# Patient Record
Sex: Male | Born: 1965 | Hispanic: Yes | Marital: Married | Smoking: Former smoker
Health system: Southern US, Community
[De-identification: ages and names within clinical notes are randomized; demographics above are authoritative.]

## PROBLEM LIST (undated history)

## (undated) DIAGNOSIS — F1010 Alcohol abuse, uncomplicated: Secondary | ICD-10-CM

## (undated) DIAGNOSIS — E785 Hyperlipidemia, unspecified: Secondary | ICD-10-CM

## (undated) DIAGNOSIS — R748 Abnormal levels of other serum enzymes: Secondary | ICD-10-CM

## (undated) DIAGNOSIS — K2970 Gastritis, unspecified, without bleeding: Secondary | ICD-10-CM

## (undated) DIAGNOSIS — R079 Chest pain, unspecified: Secondary | ICD-10-CM

## (undated) DIAGNOSIS — I10 Essential (primary) hypertension: Secondary | ICD-10-CM

## (undated) HISTORY — DX: Abnormal levels of other serum enzymes: R74.8

## (undated) HISTORY — DX: Gastritis, unspecified, without bleeding: K29.70

## (undated) HISTORY — DX: Essential (primary) hypertension: I10

## (undated) HISTORY — DX: Hyperlipidemia, unspecified: E78.5

## (undated) HISTORY — DX: Alcohol abuse, uncomplicated: F10.10

## (undated) HISTORY — DX: Chest pain, unspecified: R07.9

---

## 2015-05-18 ENCOUNTER — Emergency Department (HOSPITAL_COMMUNITY)
Admission: EM | Admit: 2015-05-18 | Discharge: 2015-05-18 | Disposition: A | Discharge status: Home / Self Care | Payer: Self-pay | Attending: Emergency Medicine | Admitting: Emergency Medicine

## 2015-05-18 ENCOUNTER — Emergency Department (HOSPITAL_COMMUNITY): Payer: Self-pay

## 2015-05-18 ENCOUNTER — Encounter (HOSPITAL_COMMUNITY): Payer: Self-pay | Admitting: Emergency Medicine

## 2015-05-18 DIAGNOSIS — Y9389 Activity, other specified: Secondary | ICD-10-CM | POA: Insufficient documentation

## 2015-05-18 DIAGNOSIS — Y9289 Other specified places as the place of occurrence of the external cause: Secondary | ICD-10-CM | POA: Insufficient documentation

## 2015-05-18 DIAGNOSIS — Y999 Unspecified external cause status: Secondary | ICD-10-CM | POA: Insufficient documentation

## 2015-05-18 DIAGNOSIS — E119 Type 2 diabetes mellitus without complications: Secondary | ICD-10-CM | POA: Insufficient documentation

## 2015-05-18 DIAGNOSIS — S99912A Unspecified injury of left ankle, initial encounter: Secondary | ICD-10-CM | POA: Insufficient documentation

## 2015-05-18 DIAGNOSIS — W19XXXA Unspecified fall, initial encounter: Secondary | ICD-10-CM | POA: Insufficient documentation

## 2015-05-18 DIAGNOSIS — N481 Balanitis: Secondary | ICD-10-CM | POA: Insufficient documentation

## 2015-05-18 DIAGNOSIS — X509XXA Other and unspecified overexertion or strenuous movements or postures, initial encounter: Secondary | ICD-10-CM | POA: Insufficient documentation

## 2015-05-18 LAB — CBG MONITORING, ED: Glucose-Capillary: 242 mg/dL — ABNORMAL HIGH (ref 65–99)

## 2015-05-18 MED ORDER — HYDROCORTISONE 1 % EX CREA: TOPICAL_CREAM | CUTANEOUS | Status: DC

## 2015-05-18 MED ORDER — METFORMIN HCL 500 MG PO TABS: 500.0000 mg | ORAL_TABLET | Freq: Two times a day (BID) | ORAL | Status: DC

## 2015-05-18 MED ORDER — FLUCONAZOLE 100 MG PO TABS
150.0000 mg | ORAL_TABLET | Freq: Every day | ORAL | Status: DC
  Administered 2015-05-18: 150 mg via ORAL
  Filled 2015-05-18: qty 2

## 2015-05-18 NOTE — Discharge Instructions (Signed)
Balanitis (Balanitis) La balanitis es la inflamacin de la cabeza del pene (glande).  CAUSAS  Puede tener mltiples causas, tanto infecciosas como no infecciosas. Con frecuencia, la balanitis es el resultado de una higiene personal deficiente, especialmente en los hombres no circuncidados. Sin una higiene [place]cuada, los virus, las bacterias y los hongos se acumulan entre el prepucio y el glande. Esto puede ocasionar una infeccin. La falta de aire y la irritacin debido a la secrecin normal llamada esmegma contribuyen al problema en los hombres no circuncidados. Otras causas son:  Irritacin qumica por el uso de algunos jabones y geles de ducha (especialmente jabones con perfume), condones, lubricantes personales, vaselina, espermicidas y acondicionadores de telas.  Enfermedades de la piel, como el eczema, la dermatitis y la psoriasis.  Alergias a algunos frmacos, como tetraciclina y sulfas.  Algunas enfermedades como la cirrosis heptica, insuficiencia cardaca congestiva y enfermedades renales.  Obesidad mrbida. FACTORES DE RIESGO  Diabetes mellitus.  Un prepucio apretado que es difcil de tirar hacia atrs hasta pasar el glande (fimosis).  Tener relaciones sexuales sin usar un condn. Blake DivineSIGNOS Y SNTOMAS  Los sntomas pueden ser:  Secrecin que proviene de la zona debajo del prepucio.  Sensibilidad.  Picazn e imposibilidad para Landscape architectmantener una ereccin (debido al dolor).  Irritacin y erupcin cutnea.  Llagas en el glande y el prepucio. DIAGNSTICO El diagnstico de balanitis se realiza a travs de un examen fsico. TRATAMIENTO El tratamiento se basa en la causa. El tratamiento puede incluir:  Lavados frecuentes.  Mantener el glande y el prepucio secos.  El uso de medicamentos, como cremas, Clinical research associateanalgsicos, antibiticos o medicamentos para tratar infecciones por hongos.  Baos de asiento. Si la irritacin est originada en una cicatriz del prepucio que impide una  retraccin fcil, se recomienda una circuncisin.  INSTRUCCIONES PARA EL CUIDADO EN EL HOGAR  Debe evitar las relaciones sexuales hasta que la afeccin se haya mejorado. ASEGRESE DE QUE:  Comprende estas instrucciones.  Controlar su afeccin.  Recibir ayuda de inmediato si no mejora o si empeora.   Esta informacin no tiene Theme park managercomo fin reemplazar el consejo del mdico. Asegrese de hacerle al mdico cualquier pregunta que tenga.   Document Released: 01/07/2013 Document Revised: 05/12/2013 Elsevier Interactive Patient Education 2016 [employer]   Diabetes mellitus tipo2, adultos (Type 2 Diabetes Mellitus, Adult) La diabetes mellitus tipo2, generalmente denominada diabetes tipo2, es una enfermedad prolongada (crnica). En la diabetes tipo2, el pncreas no produce suficiente insulina (una hormona), las clulas son menos sensibles a la insulina que se produce (resistencia a la insulina), o ambos. Normalmente, la Johnson Controlsinsulina mueve los azcares de los alimentos a las clulas de los tejidos. Las clulas de los tejidos Cendant Corporationutilizan los azcares para Psychiatristobtener energa. La falta de insulina o la falta de una respuesta normal a la insulina hace que el exceso de azcar se acumule en la sangre en lugar de Customer service managerpenetrar en las clulas de los tejidos. Como resultado, se producen niveles altos de Bankerazcar en la sangre (hiperglucemia). El [address]fect[address] de los niveles altos de azcar (glucosa) puede causar muchas complicaciones.  La diabetes tipo2 antes tambin se denominaba diabetes del [place]adulto, pero puede ocurrir a Actuarycualquier edad.  FACTORES DE RIESGO  Una persona tiene mayor predisposicin a desarrollar diabetes tipo 2 si alguien en su familia tiene la enfermedad y tambin tiene uno o ms de los siguientes factores de riesgo principales:  Aumento de [place], sobrepeso u obesidad.  Estilo de vida sedentario.  Una historia de consumo constante de alimentos de altas  caloras. Mantener un peso saludable y realizar actividad  fsica regular puede reducir la probabilidad de desarrollar diabetes tipo 2. SNTOMAS  Una persona con diabetes tipo 2 no presenta sntomas en un principio. Los sntomas de la diabetes tipo 2 aparecen lentamente. Los sntomas son:  Aumento de la sed (polidipsia).  Aumento de la miccin (poliuria).  Orina con ms frecuencia durante la noche (nocturia).  Cambios repentinos en el peso o sin motivo aparente.  Infecciones frecuentes y recurrentes.  Cansancio (fatiga).  Debilidad.  Cambios en la visin, como visin borrosa.  Olor a Water quality scientist.  Dolor abdominal.  Nuseas o vmitos.  Cortes o moretones que tardan en sanarse.  Hormigueo o adormecimiento de las manos y los pies.  Una herida abierta en la piel (lcera). DIAGNSTICO Con frecuencia la diabetes tipo 2 no se diagnostica hasta que se presentan las complicaciones de la diabetes. La diabetes tipo 2 se diagnostica cuando los sntomas o las complicaciones se presentan y cuando aumentan los niveles de glucosa en la [place]. El nivel de glucosa en la sangre puede controlarse en uno o ms de los siguientes anlisis de sangre:  Medicin de glucosa en la sangre en [place]. No se le permitir comer durante al menos 8 horas antes de que se tome [place] de [place].  Pruebas al azar de glucosa en la sangre. El nivel de glucosa en la sangre se controla en cualquier momento del da sin importar el momento en que haya comido.  Prueba de A1c (hemoglobina glucosilada) Una prueba de A1c proporciona informacin sobre el control de la glucosa en la sangre durante los ltimos 3 meses.  Prueba de tolerancia a la glucosa oral (PTGO). La glucosa en la sangre se mide despus de no haber comido (ayunas) durante dos horas y despus de beber una bebida que contenga glucosa. TRATAMIENTO   Usted puede necesitar administrarse insulina o medicamentos para la diabetes todos los das para [employer] niveles de glucosa en la sangre en el rango  deseado.  Si [place] insulina, tal vez necesite ajustar la dosis segn los carbohidratos que haya consumido en cada comida o colacin.  [employer] del tratamiento, se recomienda hacer cambios en el estilo de vida. Estos pueden incluir lo siguiente:  Customer service manager de alimentacin personalizado elaborado por un nutricionista.  Hacer ejercicio fsico a diario. El mdico establecer los objetivos personalizados del tratamiento para usted en funcin de su edad, los medicamentos que toma, el tiempo que hace que tiene diabetes y cualquier otra enfermedad que padezca. Generalmente, el objetivo del tratamiento es [employer] siguientes niveles sanguneos de glucosa:  Antes de las comidas (preprandial): de 80 a 130 mg/dl.  Antes de las comidas (preprandial): de 80 a 130 mg/dl.  A1c: menos del 6,5 % al 7 %. INSTRUCCIONES PARA EL CUIDADO EN EL HOGAR   Controle su nivel de hemoglobina A1c dos veces al ao.  Contrlese a diario Air cabin crew de glucosa en la sangre segn las indicaciones de su mdico.  Supervise las cetonas en la orina cuando est enferma y segn las indicaciones de su mdico.  Tome el medicamento para la diabetes o adminstrese insulina segn las indicaciones de su mdico para Radio producer nivel de glucosa en la sangre en el rango deseado.  Nunca se quede sin medicamento para la diabetes o sin insulina. Es necesario que la reciba [employer].  Si [place] insulina, tal vez deba ajustar la cantidad de insulina administrada segn los carbohidratos consumidos. Los hidratos  de carbono [employer] niveles de glucosa en la sangre, [place] deben incluirse en su dieta. Los hidratos de carbono aportan vitaminas, minerales y [place] que son [place] parte esencial de una dieta saludable. Los hidratos de carbono se encuentran en frutas, verduras, cereales integrales, productos lcteos, legumbres y alimentos que contienen azcares aadidos.  Consuma alimentos saludables. Programe una cita con un  nutricionista certificado para que lo ayude a Chief Executive Officer de alimentacin adecuado para usted.  Baje de peso si es necesario.  Lleve una tarjeta de alerta mdica o use una pulsera o medalla de alerta mdica.  Lleve con usted una colacin de 15gramos de hidratos de carbono en todo momento para controlar los niveles bajos de glucosa en la sangre (hipoglucemia). Algunos ejemplos de colaciones de 15gramos de hidratos de carbono son los siguientes:  Tabletas de glucosa, 3 o 4.  Gel de glucosa, tubo de 15 gramos.  Pasas de uva, 2 cucharadas (24 gramos).  Caramelos de goma, 6.  Galletas de [place], 8.  Gaseosa comn, 4onzas ( ).  Pastillas de goma, 9.  Reconocer la hipoglucemia. La hipoglucemia se produce cuando los niveles de glucosa en la sangre son de 70 mg/dl o menos. El riesgo de hipoglucemia aumenta durante el ayuno o cuando se saltea las comidas, durante o despus de Education officer, environmental ejercicio intenso y [place] duerme. Los sntomas de hipoglucemia son:  Temblores o sacudidas.  Disminucin de la capacidad de concentracin.  Sudoracin.  Aumento de la frecuencia cardaca.  Dolor de [place].  Sequedad en la boca.  Hambre.  Irritabilidad.  Ansiedad.  Sueo agitado.  Alteracin del habla o de la coordinacin.  Confusin.  Tratar la hipoglucemia rpidamente. Si usted est alerta y puede tragar con seguridad, siga la regla de 15/15 que consiste en:  [employer] 15 y 20gramos de glucosa de accin rpida o carbohidratos. Las opciones de accin rpida son un gel de glucosa, tabletas de glucosa, o 4 onzas (120 ml) de jugo de frutas, gaseosa comn, o leche baja en grasa.  Compruebe su nivel de glucosa en la sangre 15 minutos despus de tomar la glucosa.  Tome entre 15 y 20 gramos ms de glucosa si el nivel de glucosa en la sangre todava es de /dl o inferior.  Ingiera una comida o una colacin en el lapso de 1 hora una vez que los niveles de glucosa en  la sangre vuelven a la normalidad.  Est atento a si siente mucha sed u orina con mayor frecuencia, porque son signos tempranos de hiperglucemia. El reconocimiento temprano de la hiperglucemia permite un tratamiento oportuno. Trate la hiperglucemia segn le indic su mdico.  Haga, al menos, de actividad fsica moderada a la semana, distribuidos en, por lo menos, 3das a la semana o como lo indique su mdico. Adems, debe realizar ejercicios de resistencia por lo menos 2veces a la semana o como lo indique su mdico. Trate de no permanecer inactivo durante ms de seguidos.  Ajuste su medicamento y la ingesta de alimentos, segn sea necesario, si inicia un nuevo ejercicio o deporte.  Siga su plan para los [address] de enfermedad cuando no puede comer o beber como de [place].  No consuma ningn producto que contenga tabaco, como cigarrillos, tabaco de Theatre manager o Administrator, Civil Service. Si necesita ayuda para dejar de fumar, hable con el mdico.  Limite el consumo de alcohol a no ms de 1 medida por da en las mujeres no embarazadas y 2 medidas en los hombres. Debe beber alcohol  solo [place] come. Hable con su mdico acerca de si el alcohol es seguro para usted. Informe a su mdico si bebe alcohol varias veces a la semana.  Concurra a todas las visitas de control como se lo haya indicado el mdico. Esto es importante.  Programe un examen de la vista poco despus del diagnstico de diabetes tipo 2 y luego anualmente.  Realice diariamente el cuidado de la piel y de los pies. Examine su piel y los pies diariamente para ver si tiene cortes, moretones, enrojecimiento, problemas en las uas, sangrado, ampollas o Advertising account planner. Su mdico debe hacerle un examen de los pies una vez por ao.  Cepllese los dientes y encas por lo menos dos veces al da y use hilo dental al menos una vez por da. Concurra regularmente a las visitas de control con el dentista.  Comparta su plan de control de  diabetes en el trabajo o en la escuela.  Mantngase al da con las vacunas. Se recomienda que se vacune contra la gripe todos los [place]. Adems, que se vacune contra la neumona (vacuna antineumoccica). Si es mayor de 60 aos y nunca se Animator la neumona, esta vacuna puede administrarse como una serie de dos vacunas por separado. Pregntele al mdico qu otras vacunas se pueden recomendar.  Aprenda a Dealer.  Obtenga la mayor cantidad posible de informacin sobre la diabetes y solicite ayuda siempre que sea necesario.  Busque programas de rehabilitacin y participe en ellos para mantener o mejorar su independencia y su calidad de vida. Solicite la derivacin a fisioterapia o terapia ocupacional si se le [facility] o la mano, o tiene problemas para asearse, vestirse, comer, o durante la [place] fsica. SOLICITE ATENCIN MDICA SI:   No puede comer alimentos o beber por ms de 6 horas.  Tuvo nuseas o ha vomitado durante ms de 6 horas.  Su nivel de glucosa en la sangre es mayor de 240 mg/dl.  Presenta algn cambio en el estado mental.  Desarrolla una enfermedad grave adicional.  Tuvo diarrea durante ms de 6 horas.  Ha estado enfermo o ha tenido fiebre durante un par [address] y no mejora.  Siente dolor al practicar cualquier actividad fsica. SOLICITE ATENCIN MDICA DE INMEDIATO SI:  Tiene dificultad para respirar.  Tiene niveles de cetonas moderados a altos.   Esta informacin no tiene Theme park manager el consejo del mdico. Asegrese de hacerle al mdico cualquier pregunta que tenga.   Document Released: 05/07/2005 Document Revised: 01/26/2015 Elsevier Interactive Patient Education [employer]

## 2015-05-18 NOTE — ED Notes (Signed)
Patient transported to X-ray 

## 2015-05-18 NOTE — ED Notes (Signed)
Requesting Pain Medication.

## 2015-05-18 NOTE — ED Notes (Signed)
RN bedside w/ provider, using interpretator line

## 2015-05-18 NOTE — ED Notes (Signed)
Pt. reports distal penile skin  itching with redness for 2 weeks unrelieved by prescription cream , pt. also reports left lower leg / left foot pain onset last Sunday after a fall , ambulatory .

## 2015-05-20 NOTE — ED Provider Notes (Signed)
CSN: [id number]     Arrival date & time 05/18/15  0031 History   First MD Initiated Contact with Patient 05/18/15 0150     Chief Complaint  Patient presents with  . Fall    Penile itching     (Consider location/radiation/quality/duration/timing/severity/associated sxs/prior Treatment) HPI Comments: 49 y.o. Male presents for rash on his penis x6 months and for left ankle pain.  The patient reports that about 3 days ago he fell twisting his left ankle.  He has been able to walk on it with pain since that time but reports that the pain is from his ankle to below his left knee.  Denies change in sensation or strength.  Patient also reports that he has had redness with whitish discharge overlying it on the head of his penis for 6 months.  He reports using Nystatin cream without improvement or resolution.  Denies penile discharge.  No pain with urination.  Reports he may have been told in the past that he has DM but has never been treated for such.  Patient is a 49 y.o. male presenting with fall.  Fall Pertinent negatives include no abdominal pain, no headaches and no shortness of breath.    History reviewed. No pertinent past medical history. History reviewed. No pertinent past surgical history. No family history on file. Social History  Substance Use Topics  . Smoking status: Never Smoker   . Smokeless tobacco: None  . Alcohol Use: Yes    Review of Systems  Constitutional: Negative for fever, chills, appetite change and fatigue.  HENT: Negative for congestion and postnasal drip.   Eyes: Negative for pain.  Respiratory: Negative for cough, chest tightness and shortness of breath.   Gastrointestinal: Negative for nausea, vomiting, abdominal pain and diarrhea.  Genitourinary: Positive for penile pain. Negative for dysuria, urgency, flank pain, discharge and penile swelling.  Musculoskeletal: Positive for arthralgias (left ankle). Negative for myalgias and back pain.  Skin: Positive for  rash (on head of penis).  Neurological: Negative for dizziness, weakness and headaches.  Hematological: Does not bruise/bleed easily.      Allergies  Review of patient's allergies indicates no known allergies.  Home Medications   Prior to Admission medications   Medication Sig Start Date End Date Taking? Authorizing Provider  hydrocortisone cream 1 % Apply to affected area 2 times daily 05/18/15   Leta BaptistEmily Roe Nguyen, MD  metFORMIN (GLUCOPHAGE) 500 MG tablet Take 1 tablet (500 mg total) by mouth 2 (two) times daily with a meal. 05/18/15   Leta BaptistEmily Roe Nguyen, MD   BP 153/100 mmHg  Pulse 104  Temp(Src) 97.6 F (36.4 C) (Oral)  Resp 16  Ht 5\' 3"  (1.6 m)  Wt 166 lb (75.297 kg)  BMI 29.41 kg/m2  SpO2 95% Physical Exam  Constitutional: He is oriented to person, place, and time. He appears well-developed and well-nourished. No distress.  HENT:  Head: Normocephalic and atraumatic.  Right Ear: External ear normal.  Left Ear: External ear normal.  Mouth/Throat: Oropharynx is clear and moist. No oropharyngeal exudate.  Eyes: EOM are normal. Pupils are equal, round, and reactive to light.  Neck: Normal range of motion. Neck supple.  Cardiovascular: Normal rate, regular rhythm, normal heart sounds and intact distal pulses.   No murmur heard. Pulses:      Dorsalis pedis pulses are 2+ on the right side, and 2+ on the left side.  Pulmonary/Chest: Effort normal. No respiratory distress. He has no wheezes. He has no rales.  Abdominal:  Soft. He exhibits no distension. There is no tenderness.  Musculoskeletal: He exhibits no edema.       Left knee: Normal.       Left ankle: Normal. He exhibits normal range of motion. No tenderness.       Left lower leg: Normal.  Neurological: He is alert and oriented to person, place, and time. No sensory deficit. He exhibits normal muscle tone.  Skin: Skin is warm and dry. No rash noted. He is not diaphoretic.  Vitals reviewed.   ED Course  Procedures  (including critical care time) Labs Review Labs Reviewed  CBG MONITORING, ED - Abnormal; Notable for the following:    Glucose-Capillary 242 (*)    All other components within normal limits    Imaging Review No results found. I have personally reviewed and evaluated these images and lab results as part of my medical decision-making.   EKG Interpretation None      MDM  Patient was seen and evaluated in stable condition.  Exam consistent with Balanitis.  Left lower extremity examination benign and xrays negative for acute process. Glucose elevated and consistent with DM.  Patient given dose of Fluconazole and discharged with Metformin for glucose control and hydrocortisone cream.  Patient discharged home in stable condition with instruction to follow up outpatient. Final diagnoses:  Balanitis  Diabetes mellitus, new onset (HCC)    1. Balanitis  2. Diabetes mellitus     Leta Baptist, MD 05/20/15 [phone]

## 2016-09-26 ENCOUNTER — Observation Stay (HOSPITAL_BASED_OUTPATIENT_CLINIC_OR_DEPARTMENT_OTHER): Payer: Self-pay

## 2016-09-26 ENCOUNTER — Encounter (HOSPITAL_COMMUNITY): Payer: Self-pay | Admitting: Emergency Medicine

## 2016-09-26 ENCOUNTER — Observation Stay (HOSPITAL_COMMUNITY)
Admission: EM | Admit: 2016-09-26 | Discharge: 2016-09-26 | Disposition: A | Discharge status: Home / Self Care | Payer: Self-pay | Attending: Internal Medicine | Admitting: Internal Medicine

## 2016-09-26 ENCOUNTER — Emergency Department (HOSPITAL_COMMUNITY): Payer: Self-pay

## 2016-09-26 ENCOUNTER — Other Ambulatory Visit: Payer: Self-pay

## 2016-09-26 DIAGNOSIS — E1165 Type 2 diabetes mellitus with hyperglycemia: Secondary | ICD-10-CM

## 2016-09-26 DIAGNOSIS — E119 Type 2 diabetes mellitus without complications: Secondary | ICD-10-CM | POA: Insufficient documentation

## 2016-09-26 DIAGNOSIS — R079 Chest pain, unspecified: Secondary | ICD-10-CM | POA: Diagnosis present

## 2016-09-26 DIAGNOSIS — Z87891 Personal history of nicotine dependence: Secondary | ICD-10-CM | POA: Insufficient documentation

## 2016-09-26 DIAGNOSIS — Z7984 Long term (current) use of oral hypoglycemic drugs: Secondary | ICD-10-CM | POA: Insufficient documentation

## 2016-09-26 DIAGNOSIS — Z79899 Other long term (current) drug therapy: Secondary | ICD-10-CM | POA: Insufficient documentation

## 2016-09-26 DIAGNOSIS — R0789 Other chest pain: Principal | ICD-10-CM | POA: Insufficient documentation

## 2016-09-26 DIAGNOSIS — M542 Cervicalgia: Secondary | ICD-10-CM | POA: Insufficient documentation

## 2016-09-26 LAB — CBC
HCT: 42.5 % (ref 39.0–52.0)
HEMATOCRIT: 42.3 % (ref 39.0–52.0)
HEMOGLOBIN: 15 g/dL (ref 13.0–17.0)
HEMOGLOBIN: 14.9 g/dL (ref 13.0–17.0)
MCH: 31.5 pg (ref 26.0–34.0)
MCH: 31.2 pg (ref 26.0–34.0)
MCHC: 35.5 g/dL (ref 30.0–36.0)
MCHC: 35.1 g/dL (ref 30.0–36.0)
MCV: 88.9 fL (ref 78.0–100.0)
MCV: 89.1 fL (ref 78.0–100.0)
PLATELETS: 280 10*3/uL (ref 150–400)
Platelets: 287 10*3/uL (ref 150–400)
RBC: 4.76 MIL/uL (ref 4.22–5.81)
RBC: 4.77 MIL/uL (ref 4.22–5.81)
RDW: 12.6 % (ref 11.5–15.5)
RDW: 12.7 % (ref 11.5–15.5)
WBC: 9 10*3/uL (ref 4.0–10.5)
WBC: 7.8 10*3/uL (ref 4.0–10.5)

## 2016-09-26 LAB — EXERCISE TOLERANCE TEST
CHL CUP MPHR: 169 {beats}/min
CHL CUP RESTING HR STRESS: 76 {beats}/min
Estimated workload: 10.1 METS
Exercise duration (min): 8 min
Exercise duration (sec): 32 s
Peak HR: 144 {beats}/min
Percent HR: 85 %

## 2016-09-26 LAB — BASIC METABOLIC PANEL
ANION GAP: 10 (ref 5–15)
BUN: 12 mg/dL (ref 6–20)
CO2: 22 mmol/L (ref 22–32)
Calcium: 8.7 mg/dL — ABNORMAL LOW (ref 8.9–10.3)
Chloride: 104 mmol/L (ref 101–111)
Creatinine, Ser: 0.67 mg/dL (ref 0.61–1.24)
GFR calc Af Amer: >60 mL/min (ref >60)
GLUCOSE: 139 mg/dL — AB (ref 65–99)
POTASSIUM: 3.8 mmol/L (ref 3.5–5.1)
Sodium: 136 mmol/L (ref 135–145)

## 2016-09-26 LAB — GLUCOSE, CAPILLARY
GLUCOSE-CAPILLARY: 112 mg/dL — AB (ref 65–99)
Glucose-Capillary: 120 mg/dL — ABNORMAL HIGH (ref 65–99)

## 2016-09-26 LAB — CREATININE, SERUM: CREATININE: 0.64 mg/dL (ref 0.61–1.24)

## 2016-09-26 LAB — I-STAT TROPONIN, ED: Troponin i, poc: 0 ng/mL (ref 0.00–0.08)

## 2016-09-26 LAB — TROPONIN I

## 2016-09-26 LAB — HIV ANTIBODY (ROUTINE TESTING W REFLEX): HIV Screen 4th Generation wRfx: NONREACTIVE

## 2016-09-26 MED ORDER — NITROGLYCERIN 0.4 MG SL SUBL: 0.4000 mg | SUBLINGUAL_TABLET | SUBLINGUAL | Status: DC | PRN

## 2016-09-26 MED ORDER — ASPIRIN EC 325 MG PO TBEC: 325.0000 mg | DELAYED_RELEASE_TABLET | Freq: Every day | ORAL | Status: DC

## 2016-09-26 MED ORDER — INSULIN ASPART 100 UNIT/ML ~~LOC~~ SOLN
0.0000 [IU] | Freq: Three times a day (TID) | SUBCUTANEOUS | Status: DC
Start: 2016-09-26 — End: 2016-09-26

## 2016-09-26 MED ORDER — ONDANSETRON HCL 4 MG/2ML IJ SOLN
4.0000 mg | Freq: Four times a day (QID) | INTRAMUSCULAR | Status: DC | PRN
Start: 2016-09-26 — End: 2016-09-26

## 2016-09-26 MED ORDER — BLOOD GLUCOSE MONITOR KIT: PACK | 0 refills | Status: DC

## 2016-09-26 MED ORDER — ENOXAPARIN SODIUM 40 MG/0.4ML ~~LOC~~ SOLN: 40.0000 mg | SUBCUTANEOUS | Status: DC

## 2016-09-26 MED ORDER — ACETAMINOPHEN 325 MG PO TABS: 650.0000 mg | ORAL_TABLET | ORAL | Status: DC | PRN

## 2016-09-26 NOTE — Care Management Note (Signed)
Case Management Note  Patient Details  Name: Harlin HeysConstantino Perez Tolentino MRN: [id number] Date of Birth: 09/18/1965  Subjective/Objective:  Pt presented for chest pain. Pt is without PCP at this time. Plan for home.                    Action/Plan: CM [facility]. [facility]el [facility] that range in cost from $4.00-$10.00. No further needs from CM at this time.   Expected Discharge Date:  09/26/16               Expected Discharge Plan:  Home/Self Care  In-House Referral:  Interpreting Services  Discharge planning Services  CM Consult, Follow-up appt scheduled, [facility], Medication Assistance  Post Acute Care Choice:  NA Choice offered to:  NA  DME Arranged:  N/A DME Agency:  NA  HH Arranged:  NA HH Agency:  NA  Status of Service:  Completed, signed off  If discussed at Long Length of Stay Meetings, dates discussed:    Additional Comments:  Gala LewandowskyGraves-Bigelow, Ermon Sagan Kaye, RN 09/26/2016, 11:58 AM

## 2016-09-26 NOTE — Discharge Summary (Signed)
Physician Discharge Summary  Derek Cruz UYE:[id number] DOB: Feb 05, 1966 DOA: 09/26/2016  PCP: Patient, No Pcp Per  Admit date: 09/26/2016 Discharge date: 09/26/2016   Recommendations for Outpatient Follow-Up:   1. Close follow up for DM management   Discharge Diagnosis:   Principal Problem:   Chest pain Active Problems:   Diabetes mellitus type 2, controlled [facility])   Discharge disposition:  Home.  Discharge Condition: Improved.  Diet recommendation: Low sodium, heart healthy.  Carbohydrate-modified.  Wound care: None.   History of Present Illness:   Derek Cruz is a 51 y.o. male with history of diabetes mellitus type 2 presents with complaints of chest pain. Patient states he has been having chest pain off and on but last night became more persistent. Patient's chest pain is present at rest and has no exertional symptoms or any associated shortness of breath. Chest pain is left anterior chest burning in sensation. Denies any nausea vomiting or diaphoresis.   ED Course: After the GER patient's chest pain resolved without any intervention. EKG troponin and chest x-ray were unremarkable. Patient is being admitted for further observation   Hospital Course by Problem:   Chest pain - atypical CE negative Low risk stress test  Diabetes mellitus type 2 - resume metformin -arrange outpatient follow up    Medical Consultants:    cards   Discharge Exam:   Vitals:   09/26/16 0625 09/26/16 0747  BP: 133/81 125/74  Pulse: 80 72  Resp: 16 18  Temp: 98.7 F (37.1 C) 98.6 F (37 C)   Vitals:   09/26/16 0515 09/26/16 0621 09/26/16 0625 09/26/16 0747  BP: 119/81  133/81 125/74  Pulse: 72  80 72  Resp: 14  16 18   Temp:   98.7 F (37.1 C) 98.6 F (37 C)  TempSrc:   Oral Oral  SpO2: 95%  99% 99%  Weight:  79.7 kg (175 lb 11.2 oz)    Height:  5' 8"  (1.727 m)      Gen:  NAD    The results of significant diagnostics from this  hospitalization (including imaging, microbiology, ancillary and laboratory) are listed below for reference.     Procedures and Diagnostic Studies:   Dg Chest 2 View  Result Date: 09/26/2016 CLINICAL DATA:  Acute onset of left-sided burning chest pain. Dizziness. Initial encounter. EXAM: CHEST  2 VIEW COMPARISON:  None. FINDINGS: The lungs are well-aerated and clear. There is no evidence of focal opacification, pleural effusion or pneumothorax. The heart is normal in size; the mediastinal contour is within normal limits. No acute osseous abnormalities are seen. IMPRESSION: No acute cardiopulmonary process seen. Electronically Signed   By: Garald Balding M.D.   On: 09/26/2016 03:37     Labs:   Basic Metabolic Panel:  Recent Labs Lab 09/26/16 0305 09/26/16 0626  NA 136  --   K 3.8  --   CL 104  --   CO2 22  --   GLUCOSE 139*  --   BUN 12  --   CREATININE 0.67 0.64  CALCIUM 8.7*  --    GFR Estimated Creatinine Clearance: 105.7 mL/min (by C-G formula based on SCr of 0.64 mg/dL). Liver Function Tests: No results for input(s): AST, ALT, ALKPHOS, BILITOT, PROT, ALBUMIN in the last 168 hours. No results for input(s): LIPASE, AMYLASE in the last 168 hours. No results for input(s): AMMONIA in the last 168 hours. Coagulation profile No results for input(s): INR, PROTIME in the last 168 hours.  CBC:  Recent Labs Lab 09/26/16 0305 09/26/16 0626  WBC 9.0 7.8  HGB 15.0 14.9  HCT 42.3 42.5  MCV 88.9 89.1  PLT 287 280   Cardiac Enzymes:  Recent Labs Lab 09/26/16 0626  TROPONINI <0.03   BNP: Invalid input(s): POCBNP CBG:  Recent Labs Lab 09/26/16 0749  GLUCAP 120*   D-Dimer No results for input(s): DDIMER in the last 72 hours. Hgb A1c No results for input(s): HGBA1C in the last 72 hours. Lipid Profile No results for input(s): CHOL, HDL, LDLCALC, TRIG, CHOLHDL, LDLDIRECT in the last 72 hours. Thyroid function studies No results for input(s): TSH, T4TOTAL, T3FREE,  THYROIDAB in the last 72 hours.  Invalid input(s): FREET3 Anemia work up No results for input(s): VITAMINB12, FOLATE, FERRITIN, TIBC, IRON, RETICCTPCT in the last 72 hours. Microbiology No results found for this or any previous visit (from the past 240 hour(s)).   Discharge Instructions:   Discharge Instructions    Diet - low sodium heart healthy    Complete by:  As directed    Diet Carb Modified    Complete by:  As directed    Discharge instructions    Complete by:  As directed    Avoid Alcohol NSAID for chest soreness -monitor blood sugars bring log to PCP   Increase activity slowly    Complete by:  As directed      Allergies as of 09/26/2016   No Known Allergies     Medication List    STOP taking these medications   hydrocortisone cream 1 %     TAKE these medications   blood glucose meter kit and supplies Kit Dispense based on patient and insurance preference. Use up to four times daily as directed. (FOR ICD-9 250.00, 250.01).   metFORMIN 500 MG tablet Commonly known as:  GLUCOPHAGE Take 1 tablet (500 mg total) by mouth 2 (two) times daily with a meal.      Follow-up Information    Josue Hector, MD Follow up.   Specialty:  Cardiology Why:  Follow-Up as needed.  Contact information: [address] [address] [phone]            Time coordinating discharge: 25 min  Signed:  Deniece Rankin Alison Stalling   [facility] 09/26/2016, 11:01 AM

## 2016-09-26 NOTE — Consult Note (Signed)
Cardiology Consultation:   Patient ID: Derek Cruz; [id number]; Oct 16, 1965   Admit date: 09/26/2016 Date of Consult: 09/26/2016  Primary Care Provider: Patient, No Pcp Per Primary Cardiologist: New/Daeja Helderman Primary Electrophysiologist:  None   Patient Profile:   Derek Cruz is a 51 y.o. male with a hx of type 2 DM who is being seen today for the evaluation of chest pain at the request of Dr Benjamine Mola.  History of Present Illness:   Derek Cruz is a 51 y.o. no previous vascular or CAD. Been on pills for DM 2 years. Has been having chest pain 24 hours. Pain not exertional left side chest radiates to neck and head with frontal headache Not positional or pleuritic. 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 He drinks beer weekly but not excessively. No family history of premature CAD MI or stroke. Non smoker Mild 2/10 pain in chest this am No pain to palpation  History reviewed. No pertinent past medical history.  History reviewed. No pertinent surgical history.   Inpatient Medications: Scheduled Meds: . aspirin EC  325 mg Oral Daily  . enoxaparin (LOVENOX) injection  40 mg Subcutaneous Q24H  . insulin aspart  0-9 Units Subcutaneous TID WC   Continuous Infusions:  PRN Meds: acetaminophen, nitroGLYCERIN, ondansetron (ZOFRAN) IV  Allergies:   No Known Allergies  Social History:   Social History   Social History  . Marital status: Married    Spouse name: N/A  . Number of children: N/A  . Years of education: N/A   Occupational History  . Not on file.   Social History Main Topics  . Smoking status: Former Games developer  . Smokeless tobacco: Never Used  . Alcohol use Yes  . Drug use: No  . Sexual activity: Not on file   Other Topics Concern  . Not on file   Social History Narrative  . No narrative on file    Family History:   The patient's family history is negative for Diabetes Mellitus II.  ROS:  Please see the  history of present illness.  Headache  All other ROS reviewed and negative.     Physical Exam/Data:   Vitals:   09/26/16 0500 09/26/16 0515 09/26/16 0621 09/26/16 0625  BP: 122/79 119/81  133/81  Pulse: 71 72  80  Resp: 14 14  16   Temp:    98.7 F (37.1 C)  TempSrc:    Oral  SpO2: 95% 95%  99%  Weight:   175 lb 11.2 oz (79.7 kg)   Height:   5\' 8"  (1.727 m)     Intake/Output Summary (Last 24 hours) at 09/26/16 0732 Last data filed at 09/26/16 0622  Gross per 24 hour  Intake                0 ml  Output                0 ml  Net                0 ml   Filed Weights   09/26/16 0301 09/26/16 0621  Weight: 168 lb (76.2 kg) 175 lb 11.2 oz (79.7 kg)   Body mass index is 26.72 kg/m.  General:  Well nourished, well developed, in no acute distress  HEENT: normal Lymph: no adenopathy Neck: no JVD Endocrine:  No thryomegaly Vascular: No carotid bruits; FA pulses 2+ bilaterally without bruits  Cardiac:  normal S1, S2; RRR; no murmur  Lungs:  clear to auscultation bilaterally, no wheezing, rhonchi or rales  Abd: soft, nontender, no hepatomegaly  Ext: no edema Musculoskeletal:  No deformities, BUE and BLE strength normal and equal Skin: warm and dry  Neuro:  CNs 2-12 intact, no focal abnormalities noted Psych:  Normal affect    EKG:  The EKG was personally reviewed and demonstrates NSR normal ECG  Relevant CV Studies: Telemetry NSR no arrhythmia 09/26/2016   Laboratory Data:  Chemistry Recent Labs Lab 09/26/16 0305 09/26/16 0626  NA 136  --   K 3.8  --   CL 104  --   CO2 22  --   GLUCOSE 139*  --   BUN 12  --   CREATININE 0.67 0.64  CALCIUM 8.7*  --   GFRNONAA >60 >60  GFRAA >60 >60  ANIONGAP 10  --     No results for input(s): PROT, ALBUMIN, AST, ALT, ALKPHOS, BILITOT in the last 168 hours. Hematology Recent Labs Lab 09/26/16 0305 09/26/16 0626  WBC 9.0 7.8  RBC 4.76 4.77  HGB 15.0 14.9  HCT 42.3 42.5  MCV 88.9 89.1  MCH 31.5 31.2  MCHC 35.5 35.1  RDW  12.6 12.7  PLT 287 280   Cardiac Enzymes Recent Labs Lab 09/26/16 0626  TROPONINI <0.03    Recent Labs Lab 09/26/16 0319  TROPIPOC 0.00   Radiology/Studies:  Dg Chest 2 View  Result Date: 09/26/2016 CLINICAL DATA:  Acute onset of left-sided burning chest pain. Dizziness. Initial encounter. EXAM: CHEST  2 VIEW COMPARISON:  None. FINDINGS: The lungs are well-aerated and clear. There is no evidence of focal opacification, pleural effusion or pneumothorax. The heart is normal in size; the mediastinal contour is within normal limits. No acute osseous abnormalities are seen. IMPRESSION: No acute cardiopulmonary process seen. Electronically Signed   By: Roanna RaiderJeffery  Chang M.D.   On: 09/26/2016 03:37    Assessment and Plan:   1. Atypical chest pain normal ECG r/o will order ETT today and can be d/c if negative Rx with NSAI"s for now 2. DM:  Discussed low carb diet.  Target hemoglobin A1c is 6.5 or less.  Continue current medications. 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 Headache. ? Tension no focal signs plan per primary service    Signed, Charlton HawsPeter Olia Hinderliter, MD  09/26/2016 7:32 AM

## 2016-09-26 NOTE — ED Provider Notes (Signed)
[facility] DEPT Provider Note   CSN: [id number] Arrival date & time: 09/26/16  0251  By signing my name below, I, Theresia Bough, attest that this documentation has been prepared under the direction and in the presence of Varney Biles, MD. Electronically Signed: Theresia Bough, ED Scribe. 09/26/16. 3:48 AM.  History   Chief Complaint Chief Complaint  Patient presents with  . Chest Pain   The history is provided by the patient. A language interpreter was used.   HPI Comments: Derek Cruz is a 51 y.o. male with a PMHx of DM, who presents to the Emergency Department complaining of moderate constant left-sided chest pain onset two days ago. Pt describes the pain as burning and radiating to his neck and left arm. Per pt, ambulation and deep breathing makes the pain better.  Pt states the pain started after dinner (2 days ago) in his abdomin and later was located in his chest. Per pt, he went to bed and awoke in the middle of the night feeling like he was gong to faint. The lightheadedness occurred in bed and lasted 90 minutes. He reports associated tingling in his arm at that time with "yellow" vision. Pt has a hx of similar pain 2 months ago. He went to a clinic and they determined the cause to be non-compliance with DM medications. Pt denies hx of smoking, illicit drug use, ETOH use, nausea, diaphoresis, or SOB.    History reviewed. No pertinent past medical history.  Patient Active Problem List   Diagnosis Date Noted  . Chest pain 09/26/2016  . Diabetes mellitus type 2, controlled ([place]) 09/26/2016    History reviewed. No pertinent surgical history.     Home Medications    Prior to Admission medications   Medication Sig Start Date End Date Taking? Authorizing Provider  metFORMIN (GLUCOPHAGE) 500 MG tablet Take 1 tablet (500 mg total) by mouth 2 (two) times daily with a meal. 05/18/15  Yes Harvel Quale, MD  blood glucose meter kit and supplies KIT  Dispense based on patient and insurance preference. Use up to four times daily as directed. (FOR ICD-9 250.00, 250.01). 09/26/16   Geradine Girt, DO    Family History Family History  Problem Relation Age of Onset  . Diabetes Mellitus II Neg Hx     Social History Social History  Substance Use Topics  . Smoking status: Former Research scientist (life sciences)  . Smokeless tobacco: Never Used  . Alcohol use Yes     Allergies   Patient has no known allergies.   Review of Systems Review of Systems  Constitutional: Negative for diaphoresis.  Eyes: Positive for visual disturbance.  Respiratory: Negative for shortness of breath.   Cardiovascular: Positive for chest pain.  Gastrointestinal: Negative for nausea.  Musculoskeletal: Positive for myalgias (LUE) and neck pain.  Neurological: Positive for light-headedness.  All other systems reviewed and are negative.    Physical Exam Updated Vital Signs BP 121/74   Pulse 72   Temp 98.1 F (36.7 C) (Oral)   Resp 17   Ht 5' 9"  (1.753 m)   Wt 168 lb (76.2 kg)   SpO2 97%   BMI 24.81 kg/m   Physical Exam  Constitutional: He is oriented to person, place, and time. He appears well-developed and well-nourished.  HENT:  Head: Normocephalic and atraumatic.  Eyes: Conjunctivae are normal.  Neck: Neck supple.  Cardiovascular: Normal rate and regular rhythm.   2+ radial pulse.  Pulmonary/Chest: Effort normal. No respiratory distress. He has no  wheezes. He has no rales.  Abdominal: Soft. There is tenderness (RLQ and LLQ). There is no rebound and no guarding.  Musculoskeletal: Normal range of motion.  Neurological: He is alert and oriented to person, place, and time.  Cranial nerves 2-12 intact. Bilateral upper extremity strength 4/5. Bilateral upper and lower extremity sensory exam normal. Cerebellar exam reveals no dysmetria.   Skin: Skin is warm and dry.  Psychiatric: He has a normal mood and affect.  Nursing note and vitals reviewed.    ED Treatments /  Results  DIAGNOSTIC STUDIES: Oxygen Saturation is 99% on RA, normal by my interpretation.   COORDINATION OF CARE: 4:04 AM-Discussed next steps with pt. Pt verbalized understanding and is agreeable with the plan.   Labs (all labs ordered are listed, but only abnormal results are displayed) Labs Reviewed  BASIC METABOLIC PANEL - Abnormal; Notable for the following:       Result Value   Glucose, Bld 139 (*)    Calcium 8.7 (*)    All other components within normal limits  GLUCOSE, CAPILLARY - Abnormal; Notable for the following:    Glucose-Capillary 120 (*)    All other components within normal limits  GLUCOSE, CAPILLARY - Abnormal; Notable for the following:    Glucose-Capillary 112 (*)    All other components within normal limits  CBC  TROPONIN I  TROPONIN I  HIV ANTIBODY (ROUTINE TESTING)  CBC  CREATININE, SERUM  I-STAT TROPOININ, ED    EKG  EKG Interpretation  Date/Time:  Wednesday Sep 26 2016 02:54:04 EDT Ventricular Rate:  74 PR Interval:  160 QRS Duration: 102 QT Interval:  402 QTC Calculation: 446 R Axis:   -32 Text Interpretation:  Normal sinus rhythm Left axis deviation Abnormal ECG No acute changes No old tracing to compare Confirmed by Varney Biles [phone]) on 09/26/2016 4:25:56 AM Also confirmed by Varney Biles [phone]), editor Drema Pry [phone])  on 09/26/2016 8:42:20 AM       Radiology No results found.  Procedures Procedures (including critical care time)  Medications Ordered in ED Medications - No data to display   Initial Impression / Assessment and Plan / ED Course  I have reviewed the triage vital signs and the nursing notes.  Pertinent labs & imaging results that were available during my care of the patient were reviewed by me and considered in my medical decision making (see chart for details).     PT with hx of DM with atypical chest pain. HEART score is 4. Admit for ACS r/o.  Based on exam, PE and dissection  unlikely.   Final Clinical Impressions(s) / ED Diagnoses   Final diagnoses:  Controlled type 2 diabetes mellitus with hyperglycemia, without long-term current use of insulin ([place])    New Prescriptions Discharge Medication List as of 09/26/2016 12:32 PM    START taking these medications   Details  blood glucose meter kit and supplies KIT Dispense based on patient and insurance preference. Use up to four times daily as directed. (FOR ICD-9 250.00, 250.01)., Print       I personally performed the services described in this documentation, which was scribed in my presence. The recorded information has been reviewed and is accurate.     Varney Biles, MD 10/01/16 [phone]

## 2016-09-26 NOTE — H&P (Signed)
History and Physical    Auren Valdes ZOX:[id number] DOB: [birth year] DOA: 09/26/2016  PCP: Patient, No Pcp Per  Patient coming from: Home.  Chief Complaint: Chest pain.  HPI: Braylon Stockton Nunley is a 51 y.o. male with history of diabetes mellitus type 2 presents with complaints of chest pain. Patient states he has been having chest pain off and on but last night became more persistent. Patient's chest pain is present at rest and has no exertional symptoms or any associated shortness of breath. Chest pain is left anterior chest burning in sensation. Denies any nausea vomiting or diaphoresis.   ED Course: After the GER patient's chest pain resolved without any intervention. EKG troponin and chest x-ray were unremarkable. Patient is being admitted for further observation.  Review of Systems: As per HPI, rest all negative.   History reviewed. No pertinent past medical history.  History reviewed. No pertinent surgical history.   reports that he has quit smoking. He has never used smokeless tobacco. He reports that he drinks alcohol. He reports that he does not use drugs.  No Known Allergies  Family History  Problem Relation Age of Onset  . Diabetes Mellitus II Neg Hx     Prior to Admission medications   Medication Sig Start Date End Date Taking? Authorizing Provider  metFORMIN (GLUCOPHAGE) 500 MG tablet Take 1 tablet (500 mg total) by mouth 2 (two) times daily with a meal. 05/18/15  Yes Leta Baptist, MD  hydrocortisone cream 1 % Apply to affected area 2 times daily Patient not taking: Reported on 09/26/2016 05/18/15   Leta Baptist, MD    Physical Exam: Vitals:   09/26/16 0415 09/26/16 0430 09/26/16 0500 09/26/16 0515  BP: 130/87 130/74 122/79 119/81  Pulse: 74 73 71 72  Resp: 15 14 14 14   Temp:      TempSrc:      SpO2: 97% 97% 95% 95%  Weight:      Height:          Constitutional: Moderately built and nourished. Vitals:   09/26/16 0415  09/26/16 0430 09/26/16 0500 09/26/16 0515  BP: 130/87 130/74 122/79 119/81  Pulse: 74 73 71 72  Resp: 15 14 14 14   Temp:      TempSrc:      SpO2: 97% 97% 95% 95%  Weight:      Height:       Eyes: Anicteric no pallor. ENMT: No discharge from the ears eyes nose or mouth. Neck: No mass felt. No neck rigidity. No JVD appreciated. Respiratory: No rhonchi or crepitations. Cardiovascular: S1 and S2 heard no murmurs appreciated. Abdomen: Soft nontender bowel sounds present. Musculoskeletal: No edema. No joint effusion. Skin: No rash. Skin appears warm. Neurologic: Alert awake oriented to time place and person. Moves all extremities. Psychiatric: Appears normal. Normal affect.   Labs on Admission: I have personally reviewed following labs and imaging studies  CBC:  Recent Labs Lab 09/26/16 0305  WBC 9.0  HGB 15.0  HCT 42.3  MCV 88.9  PLT 287   Basic Metabolic Panel:  Recent Labs Lab 09/26/16 0305  NA 136  K 3.8  CL 104  CO2 22  GLUCOSE 139*  BUN 12  CREATININE 0.67  CALCIUM 8.7*   GFR: Estimated Creatinine Clearance: 109.2 mL/min (by C-G formula based on SCr of 0.67 mg/dL). Liver Function Tests: No results for input(s): AST, ALT, ALKPHOS, BILITOT, PROT, ALBUMIN in the last 168 hours. No results for input(s): LIPASE, AMYLASE in  the last 168 hours. No results for input(s): AMMONIA in the last 168 hours. Coagulation Profile: No results for input(s): INR, PROTIME in the last 168 hours. Cardiac Enzymes: No results for input(s): CKTOTAL, CKMB, CKMBINDEX, TROPONINI in the last 168 hours. BNP (last 3 results) No results for input(s): PROBNP in the last 8760 hours. HbA1C: No results for input(s): HGBA1C in the last 72 hours. CBG: No results for input(s): GLUCAP in the last 168 hours. Lipid Profile: No results for input(s): CHOL, HDL, LDLCALC, TRIG, CHOLHDL, LDLDIRECT in the last 72 hours. Thyroid Function Tests: No results for input(s): TSH, T4TOTAL, FREET4, T3FREE,  THYROIDAB in the last 72 hours. Anemia Panel: No results for input(s): VITAMINB12, FOLATE, FERRITIN, TIBC, IRON, RETICCTPCT in the last 72 hours. Urine analysis: No results found for: COLORURINE, APPEARANCEUR, LABSPEC, PHURINE, GLUCOSEU, HGBUR, BILIRUBINUR, KETONESUR, PROTEINUR, UROBILINOGEN, NITRITE, LEUKOCYTESUR Sepsis Labs: @LABRCNTIP (procalcitonin:4,lacticidven:4) )No results found for this or any previous visit (from the past 240 hour(s)).   Radiological Exams on Admission: Dg Chest 2 View  Result Date: 09/26/2016 CLINICAL DATA:  Acute onset of left-sided burning chest pain. Dizziness. Initial encounter. EXAM: CHEST  2 VIEW COMPARISON:  None. FINDINGS: The lungs are well-aerated and clear. There is no evidence of focal opacification, pleural effusion or pneumothorax. The heart is normal in size; the mediastinal contour is within normal limits. No acute osseous abnormalities are seen. IMPRESSION: No acute cardiopulmonary process seen. Electronically Signed   By: Roanna RaiderJeffery  Chang M.D.   On: 09/26/2016 03:37    EKG: Independently reviewed. Normal sinus rhythm.  Assessment/Plan Principal Problem:   Chest pain Active Problems:   Diabetes mellitus type 2, controlled ([facility])    1. Chest pain - given the history of diabetes mellitus will cycle cardiac markers to rule out ACS and check 2-D echo. Patient is on aspirin at this time. Consult cardiology in a.m. 2. Diabetes mellitus type 2 - last hemoglobin A1c not known. We'll hold metformin while inpatient and keep patient on sliding scale coverage.   DVT prophylaxis: Lovenox. Code Status: Full code.  Family Communication: Discussed patient.  Disposition Plan: Home.  Consults called: None.  Admission status: Observation.    Eduard ClosKAKRAKANDY,Donovan Persley N. MD [facility] Pager [phone].  If 7PM-7AM, please contact night-coverage [email or web address] Password [email or web address]  09/26/2016, 6:05 AM

## 2016-09-26 NOTE — ED Triage Notes (Signed)
Pt reports left sided "burning" chest pain that started two days ago.  States he feels "dizzy and not right".  Denies N/V/D, diaphoresis, fever or SOB.

## 2016-09-26 NOTE — Discharge Instructions (Signed)
Control del nivel sanguíneo de glucosa en los adultos °(Blood Glucose Monitoring, Adult) °El control del nivel de azúcar (glucosa) en la sangre lo ayuda a tener la diabetes bajo control. También ayuda a que usted y el médico controlen si el tratamiento de la diabetes es eficaz. El control del nivel sanguíneo de glucosa implica realizar controles regulares como lo indique el médico y llevar registro de los resultados (registro diario). °¿POR QUÉ DEBO CONTROLAR EL NIVEL SANGUÍNEO DE GLUCOSA? °Si controla su nivel sanguíneo de glucosa con regularidad, podrá: °· Comprender de qué manera los alimentos, la actividad física, las enfermedades y los medicamentos inciden en los niveles sanguíneos de glucosa. °· Conocer el nivel sanguíneo de glucosa en cualquier momento dado. Saber rápidamente si el nivel es bajo (hipoglucemia) o alto (hiperglucemia). °· Puede ser de ayuda para que usted y el médico sepan cómo ajustar los medicamentos. °¿CUÁNDO DEBO CONTROLAR EL NIVEL SANGUÍNEO DE GLUCOSA? °Siga las indicaciones del médico acerca de la frecuencia con la que debe controlar el nivel sanguíneo de glucosa. La frecuencia puede depender de: °· El tipo de diabetes que tenga. °· Si su diabetes está bajo control. °· Los medicamentos que toma. °Si usted tiene diabetes tipo 1: °· Controle su nivel sanguíneo de glucosa al menos dos veces al día. °· También controle su nivel sanguíneo de glucosa: °? Antes de cada inyección de insulina. °? Antes y después de hacer ejercicio. °? Entre las comidas. °? Dos horas después de una comida. °? Ocasionalmente, entre las 2:00 a. m. y las 3:00 a. m., como se lo hayan indicado. °? Antes de realizar tareas peligrosas, como manejar o usar maquinaria pesada. °? A la hora de acostarse. °· Es posible que deba controlar con más frecuencia los niveles sanguíneos de glucosa, hasta 6 a 10 veces por día: °? Si usa una bomba de insulina. °? Si necesita varias inyecciones diarias. °? Si su diabetes no está bien  controlada. °? Si está enfermo. °? Si tiene antecedentes de hipoglucemia grave. °? Si tiene antecedentes de no darse cuenta cuándo está bajando su nivel sanguíneo de glucosa (hipoglucemia asintomática). °Si usted tiene diabetes tipo 2: °· Si recibe insulina u otro medicamento para la diabetes, controle el nivel sanguíneo de glucosa al menos dos veces al día. °· Mientras reciba tratamiento intensivo con insulina, debe medirse el nivel sanguíneo de glucosa al menos 4 veces al día. Ocasionalmente, es posible que deba controlarse entre las 2:00 a. m. y las 3:00 a. m., según se lo indiquen. °· También controle su nivel sanguíneo de glucosa: °? Antes y después de hacer ejercicio. °? Antes de realizar tareas peligrosas, como manejar o usar maquinaria pesada. °· Es posible que deba controlar con más frecuencia los niveles sanguíneos de glucosa si: °? Es necesario ajustar la dosis de sus medicamentos. °? Su diabetes no está bien controlada. °? Está enfermo. °¿QUÉ ES UN REGISTRO DIARIO DEL NIVEL SANGUÍNEO DE GLUCOSA? °· Un registro diario es un registro de los valores de glucosa en la sangre. Puede ayudarles a usted y a su médico a: °? Buscar patrones en su nivel sanguíneo de glucosa durante el transcurso del tiempo. °? Ajustar su plan de control de la diabetes como sea necesario. °· Cada vez que controle su nivel sanguíneo de glucosa, anote el resultado y aquellos factores que pueden estar afectando su nivel sanguíneo de glucosa, como la dieta y la actividad física realizada en el día. °· La mayoría de los medidores de glucosa guardan un registro de las lecturas realizadas con el medidor. Algunos permiten descargar sus registros en   una computadora. ° °¿CÓMO ME CONTROLO EL NIVEL SANGUÍNEO DE GLUCOSA? °Siga los siguientes pasos para obtener lecturas precisas de su glucemia: °Materiales necesarios °· Medidor de glucosa en la sangre. °· Tiras reactivas para el medidor. Cada medidor tiene sus propias tiras reactivas. Debe usar  las tiras reactivas que trae su medidor. °· Una aguja para pincharse el dedo (lanceta). No utilice la misma lanceta en más de una ocasión. °· Un dispositivo que sujeta la lanceta (dispositivo de punción). °· Un diario o libro de anotaciones para anotar los resultados. °Procedimiento °· Lávese las manos con agua y jabón. °· Pínchese el costado del dedo (no la punta) con la lanceta. Use un dedo diferente cada vez. °· Frote suavemente el dedo hasta que aparezca una pequeña gota de sangre. °· Siga las instrucciones que vienen con el medidor para insertar la tira reactiva, aplicar la sangre sobre la tira y usar el medidor de glucosa en la sangre. °· Registre el resultado y las observaciones que desee. °Zonas del cuerpo alternativas para realizar las pruebas °· Algunos medidores le permiten tomar sangre para la prueba de otras zonas del cuerpo que no son el dedo (zonas alternativas). °· Si cree que tiene hipoglucemia o si tiene hipoglucemia asintomática, no utilice las zonas alternativas del cuerpo. En su lugar, use los dedos. °· Es posible que las zonas alternativas no sean tan precisas como los dedos porque el flujo de sangre es más lento en esas zonas. Esto significa que el resultado que obtiene de estas zonas puede estar retrasado y ser un poco diferente del resultado que obtendría del dedo. °· Los sitios alternativos más comunes son los siguientes: °? Los antebrazos. °? Los muslos. °? La palma de la mano. °Consejos adicionales °· Siempre tenga los insumos a mano. °· Todos los medidores de glucosa incluyen un número de teléfono "directo", disponible las 24 horas, al que podrá llamar si tiene preguntas o necesita ayuda. También puede consultar a su médico. °· Después de usar algunas cajas de tiras reactivas, ajuste (calibre) el medidor de glucemia según las instrucciones del medidor. °Esta información no tiene como fin reemplazar el consejo del médico. Asegúrese de hacerle al médico cualquier pregunta que  tenga. °Document Released: 05/07/2005 Document Revised: 08/29/2015 Document Reviewed: 10/17/2015 °Elsevier Interactive Patient Education © 2017 Elsevier Inc[employer] ° °

## 2016-10-02 ENCOUNTER — Ambulatory Visit (INDEPENDENT_AMBULATORY_CARE_PROVIDER_SITE_OTHER): Payer: Self-pay | Admitting: Family Medicine

## 2016-10-02 ENCOUNTER — Encounter: Payer: Self-pay | Admitting: Family Medicine

## 2016-10-02 VITALS — BP 121/68 | HR 62 | Temp 98.2°F | Resp 18 | Ht 68.0 in | Wt 174.8 lb

## 2016-10-02 DIAGNOSIS — E119 Type 2 diabetes mellitus without complications: Secondary | ICD-10-CM

## 2016-10-02 DIAGNOSIS — E785 Hyperlipidemia, unspecified: Secondary | ICD-10-CM

## 2016-10-02 DIAGNOSIS — Z125 Encounter for screening for malignant neoplasm of prostate: Secondary | ICD-10-CM

## 2016-10-02 DIAGNOSIS — R079 Chest pain, unspecified: Secondary | ICD-10-CM

## 2016-10-02 DIAGNOSIS — R1032 Left lower quadrant pain: Secondary | ICD-10-CM

## 2016-10-02 DIAGNOSIS — Z794 Long term (current) use of insulin: Secondary | ICD-10-CM

## 2016-10-02 LAB — LIPID PANEL
CHOL/HDL RATIO: 6.4 ratio — AB (ref <5.0)
CHOLESTEROL: 211 mg/dL — AB (ref <200)
HDL: 33 mg/dL — ABNORMAL LOW (ref >40)
LDL CALC: 134 mg/dL — AB (ref <100)
Triglycerides: 219 mg/dL — ABNORMAL HIGH (ref <150)
VLDL: 44 mg/dL — AB (ref <30)

## 2016-10-02 LAB — COMPLETE METABOLIC PANEL WITH GFR
ALBUMIN: 4.3 g/dL (ref 3.6–5.1)
ALT: 10 U/L (ref 9–46)
AST: 12 U/L (ref 10–35)
Alkaline Phosphatase: 86 U/L (ref 40–115)
BUN: 14 mg/dL (ref 7–25)
CALCIUM: 9.4 mg/dL (ref 8.6–10.3)
CO2: 22 mmol/L (ref 20–31)
CREATININE: 0.62 mg/dL — AB (ref 0.70–1.33)
Chloride: 105 mmol/L (ref 98–110)
GFR, Est African American: >89 mL/min (ref >=60)
GFR, Est Non African American: >89 mL/min (ref >=60)
GLUCOSE: 111 mg/dL — AB (ref 65–99)
Potassium: 4 mmol/L (ref 3.5–5.3)
SODIUM: 138 mmol/L (ref 135–146)
Total Bilirubin: 0.5 mg/dL (ref 0.2–1.2)
Total Protein: 7.1 g/dL (ref 6.1–8.1)

## 2016-10-02 LAB — CBC WITH DIFFERENTIAL/PLATELET
BASOS ABS: 79 {cells}/uL (ref 0–200)
Basophils Relative: 1 %
EOS PCT: 5 %
Eosinophils Absolute: 395 cells/uL (ref 15–500)
HCT: 42.6 % (ref 38.5–50.0)
Hemoglobin: 14.6 g/dL (ref 13.2–17.1)
LYMPHS ABS: 2291 {cells}/uL (ref 850–3900)
LYMPHS PCT: 29 %
MCH: 31.3 pg (ref 27.0–33.0)
MCHC: 34.3 g/dL (ref 32.0–36.0)
MCV: 91.4 fL (ref 80.0–100.0)
MONOS PCT: 10 %
MPV: 9.1 fL (ref 7.5–12.5)
Monocytes Absolute: 790 cells/uL (ref 200–950)
NEUTROS PCT: 55 %
Neutro Abs: 4345 cells/uL (ref 1500–7800)
PLATELETS: 278 10*3/uL (ref 140–400)
RBC: 4.66 MIL/uL (ref 4.20–5.80)
RDW: 13.4 % (ref 11.0–15.0)
WBC: 7.9 10*3/uL (ref 3.8–10.8)

## 2016-10-02 LAB — THYROID PANEL WITH TSH
Free Thyroxine Index: 2.1 (ref 1.4–3.8)
T3 Uptake: 28 % (ref 22–35)
T4 TOTAL: 7.6 ug/dL (ref 4.5–12.0)
TSH: 1.62 mIU/L (ref 0.40–4.50)

## 2016-10-02 LAB — POCT GLYCOSYLATED HEMOGLOBIN (HGB A1C): Hemoglobin A1C: 8.2

## 2016-10-02 LAB — PSA: PSA: 0.6 ng/mL (ref <=4.0)

## 2016-10-02 MED ORDER — METFORMIN HCL 500 MG PO TABS: ORAL_TABLET | ORAL | 1 refills | Status: DC

## 2016-10-02 MED ORDER — SAXAGLIPTIN HCL 2.5 MG PO TABS: 2.5000 mg | ORAL_TABLET | Freq: Every day | ORAL | 1 refills | Status: DC

## 2016-10-02 MED ORDER — OMEPRAZOLE 20 MG PO CPDR: 20.0000 mg | DELAYED_RELEASE_CAPSULE | Freq: Every day | ORAL | 3 refills | Status: DC

## 2016-10-02 MED FILL — OMEPRAZOLE DR 20 MG CAPSULE: 20 | 30 days supply | Qty: 30 | Fill #0

## 2016-10-02 MED FILL — ?METFORMIN HCL 500MG TABLET: 500 | 30 days supply | Qty: 60 | Fill #0

## 2016-10-02 NOTE — Patient Instructions (Addendum)
-I have schedule you an appointment with cardiology June 4th 2:30 pm   -I am ordering an abdominal ultrasound for evaluation of left lower quadrant pain.  -I am starting you on Onglyza 2.5 mg once daily   -Abdominal pain, I will treat as acid reflux for now, take omeprazole 20 mg once daily.   Diabetes Mellitus and Exercise Exercising regularly is important for your overall health, especially when you have diabetes (diabetes mellitus). Exercising is not only about losing weight. It has many health benefits, such as increasing muscle strength and bone density and reducing body fat and stress. This leads to improved fitness, flexibility, and endurance, all of which result in better overall health. Exercise has additional benefits for people with diabetes, including:  Reducing appetite.  Helping to lower and control blood glucose.  Lowering blood pressure.  Helping to control amounts of fatty substances (lipids) in the blood, such as cholesterol and triglycerides.  Helping the body to respond better to insulin (improving insulin sensitivity).  Reducing how much insulin the body needs.  Decreasing the risk for heart disease by:  Lowering cholesterol and triglyceride levels.  Increasing the levels of good cholesterol.  Lowering blood glucose levels. What is my activity plan? Your health care provider or certified diabetes educator can help you make a plan for the type and frequency of exercise (activity plan) that works for you. Make sure that you:  Do at least 150 minutes of moderate-intensity or vigorous-intensity exercise each week. This could be brisk walking, biking, or water aerobics.  Do stretching and strength exercises, such as yoga or weightlifting, at least 2 times a week.  Spread out your activity over at least 3 days of the week.  Get some form of physical activity every day.  Do not go more than 2 days in a row without some kind of physical activity.  Avoid being  inactive for more than 90 minutes at a time. Take frequent breaks to walk or stretch.  Choose a type of exercise or activity that you enjoy, and set realistic goals.  Start slowly, and gradually increase the intensity of your exercise over time. What do I need to know about managing my diabetes?  Check your blood glucose before and after exercising.  If your blood glucose is higher than 240 mg/dL (91.413.3 mmol/L) before you exercise, check your urine for ketones. If you have ketones in your urine, do not exercise until your blood glucose returns to normal.  Know the symptoms of low blood glucose (hypoglycemia) and how to treat it. Your risk for hypoglycemia increases during and after exercise. Common symptoms of hypoglycemia can include:  Hunger.  Anxiety.  Sweating and feeling clammy.  Confusion.  Dizziness or feeling light-headed.  Increased heart rate or palpitations.  Blurry vision.  Tingling or numbness around the mouth, lips, or tongue.  Tremors or shakes.  Irritability.  Keep a rapid-acting carbohydrate snack available before, during, and after exercise to help prevent or treat hypoglycemia.  Avoid injecting insulin into areas of the body that are going to be exercised. For example, avoid injecting insulin into:  The arms, when playing tennis.  The legs, when jogging.  Keep records of your exercise habits. Doing this can help you and your health care provider adjust your diabetes management plan as needed. Write down:  Food that you eat before and after you exercise.  Blood glucose levels before and after you exercise.  The type and amount of exercise you have done.  When your insulin is expected to peak, if you use insulin. Avoid exercising at times when your insulin is peaking.  When you start a new exercise or activity, work with your health care provider to make sure the activity is safe for you, and to adjust your insulin, medicines, or food intake as  needed.  Drink plenty of water while you exercise to prevent dehydration or heat stroke. Drink enough fluid to keep your urine clear or pale yellow. This information is not intended to replace advice given to you by your health care provider. Make sure you discuss any questions you have with your health care provider. Document Released: 07/28/2003 Document Revised: 11/25/2015 Document Reviewed: 10/17/2015 Elsevier Interactive Patient Education  2017 [employer].

## 2016-10-02 NOTE — Progress Notes (Signed)
Patient ID: Derek Cruz, male    DOB: 23-Jan-1966, 51 y.o.   MRN: [id number]  PCP: Scot Jun, FNP  Hospital Follow-up  Subjective:  HPI Derek Cruz is a 51 y.o. male presents to establish care and for hospital follow-up. Medical history on includes Uncontrolled Type 2 Diabetes and Atypical Chest pain (recent). Rubin present to the emergency department 09/26/2016 with a complaint of acute onset chest pain, at rest,   In the absence of associated shortness of breath. Denies any prior similar episodes. A stress test was performed which was negative of any abnormal findings. The test was  stopped, as the patient developed dypsnea.   Today he reports resolution of symptoms. He has diabetes for which he takes Metformin daily although has not had any recent primary care follow-up as he is uninsured. Reports only increased frequency of urination persistently. Last A1c unknown. He reports no routine home monitoring of blood sugar. Reports continued shortness of breath accompanied by left lower quandrant abdominal pain which has persisted for  Around 8 days. He has not attempted any otc medication to relieve abdominal symptoms.  Denies nausea and has had somediarrhea although non persistent.  Social History   Social History  . Marital status: Married    Spouse name: N/A  . Number of children: N/A  . Years of education: N/A   Occupational History  . Not on file.   Social History Main Topics  . Smoking status: Former Research scientist (life sciences)  . Smokeless tobacco: Never Used  . Alcohol use Yes  . Drug use: No  . Sexual activity: Not on file   Other Topics Concern  . Not on file   Social History Narrative  . No narrative on file    Family History  Problem Relation Age of Onset  . Diabetes Mellitus II Neg Hx    Review of Systems See HPI Patient Active Problem List   Diagnosis Date Noted  . Chest pain 09/26/2016  . Diabetes mellitus type 2,  controlled ([place]) 09/26/2016    No Known Allergies  Prior to Admission medications   Medication Sig Start Date End Date Taking? Authorizing Provider  blood glucose meter kit and supplies KIT Dispense based on patient and insurance preference. Use up to four times daily as directed. (FOR ICD-9 250.00, 250.01). 09/26/16   Geradine Girt, DO  metFORMIN (GLUCOPHAGE) 500 MG tablet Take 1 tablet (500 mg total) by mouth 2 (two) times daily with a meal. 05/18/15   Harvel Quale, MD    Past Medical, Surgical Family and Social History reviewed and updated.    Objective:   Vitals:   10/02/16 1140  BP: 121/68  Pulse: 62  Resp: 18  Temp: 98.2 F (36.8 C)   Physical Exam  Constitutional: He is oriented to person, place, and time. He appears well-developed and well-nourished.  HENT:  Head: Normocephalic and atraumatic.  Eyes: Conjunctivae are normal. Pupils are equal, round, and reactive to light.  Neck: Normal range of motion. Neck supple.  Cardiovascular: Normal rate, regular rhythm, normal heart sounds and intact distal pulses.   Pulmonary/Chest: Effort normal.  Abdominal: Soft. He exhibits no distension and no mass. There is tenderness. There is no rebound and no guarding.  Musculoskeletal: Normal range of motion.  Neurological: He is alert and oriented to person, place, and time.  Skin: Skin is warm and dry.  Psychiatric: He has a normal mood and affect. His behavior is normal. Judgment and thought content normal.  Assessment & Plan:  1. Type 2 diabetes mellitus without complication, with long-term current use of insulin (HCC) - POCT glycosylated hemoglobin (Hb A1C)-8.2 uncontrolled  -Continue Metformin and adding Saxagliptin 2.5 mg daily to achieve goal A1C <8 -Increase physical activity as tolerated to achieve weight loss - COMPLETE METABOLIC PANEL WITH GFR - Thyroid Panel With TSH - Lipid panel  2. Chest pain, unspecified type -Ambulatory referral to Cardiology Dr.  Johnsie Cancel recommended per ED discharge -EKG 09/25/2016 indicated left axis deviation  -Negative stress test, although patient developed dyspnea during test (09/26/2016) -Continue to experience dyspnea with strenuous activity. - COMPLETE METABOLIC PANEL WITH GFR - CBC with Differential  3. Left lower quadrant abdominal pain of unknown etiology, non-specific ongoing 8 days  - US Abdomen Complete; Future -Start omeprazole 20 mg once daily. If symptoms resolve prior to [name] let me know, we will cancel study.  4. Screening for prostate cancer, urinary frequency likely related to diabetes, however no recent PSA on file - PSA   RTC: 4 weeks or sooner if needed. F/u abdominal pain, labs, blood sugar.  Carroll Sage. Kenton Kingfisher, MSN, FNP-C [facility]  [address] [place], [place] [address] [phone]

## 2016-10-03 LAB — POCT URINALYSIS DIP (DEVICE)
Bilirubin Urine: NEGATIVE
Glucose, UA: NEGATIVE mg/dL
HGB URINE DIPSTICK: NEGATIVE
Ketones, ur: NEGATIVE mg/dL
LEUKOCYTES UA: NEGATIVE
NITRITE: NEGATIVE
PH: 6 (ref 5.0–8.0)
Protein, ur: NEGATIVE mg/dL
Specific Gravity, Urine: 1.025 (ref 1.005–1.030)
UROBILINOGEN UA: 0.2 mg/dL (ref 0.0–1.0)

## 2016-10-05 ENCOUNTER — Telehealth: Payer: Self-pay

## 2016-10-05 NOTE — Telephone Encounter (Deleted)
[facility] states that we need to have 2 different labs test showing Low-T and then they will approve the medication. Patient will come in next week to have lab drawn again.

## 2016-10-06 DIAGNOSIS — E785 Hyperlipidemia, unspecified: Secondary | ICD-10-CM | POA: Insufficient documentation

## 2016-10-09 ENCOUNTER — Ambulatory Visit (HOSPITAL_COMMUNITY): Payer: Self-pay | Attending: Family Medicine

## 2016-10-22 ENCOUNTER — Ambulatory Visit: Payer: Self-pay | Admitting: Cardiology

## 2016-10-26 ENCOUNTER — Encounter: Payer: Self-pay | Admitting: Cardiology

## 2016-10-26 MED FILL — ?METFORMIN HCL 500MG TABLET: 500 | 30 days supply | Qty: 90 | Fill #1

## 2016-10-30 ENCOUNTER — Ambulatory Visit (INDEPENDENT_AMBULATORY_CARE_PROVIDER_SITE_OTHER): Payer: Self-pay | Admitting: Family Medicine

## 2016-10-30 VITALS — BP 126/74 | HR 67 | Temp 98.0°F | Resp 12 | Ht 68.0 in | Wt 176.0 lb

## 2016-10-30 DIAGNOSIS — E1165 Type 2 diabetes mellitus with hyperglycemia: Secondary | ICD-10-CM

## 2016-10-30 LAB — POCT URINALYSIS DIP (DEVICE)
BILIRUBIN URINE: NEGATIVE
Glucose, UA: NEGATIVE mg/dL
Hgb urine dipstick: NEGATIVE
KETONES UR: NEGATIVE mg/dL
Leukocytes, UA: NEGATIVE
NITRITE: NEGATIVE
PH: 7 (ref 5.0–8.0)
Protein, ur: NEGATIVE mg/dL
Specific Gravity, Urine: 1.015 (ref 1.005–1.030)
Urobilinogen, UA: 0.2 mg/dL (ref 0.0–1.0)

## 2016-10-30 LAB — GLUCOSE, CAPILLARY: GLUCOSE-CAPILLARY: 89 mg/dL (ref 65–99)

## 2016-10-30 NOTE — Progress Notes (Signed)
Patient ID: Derek Cruz, male    DOB: 1966/02/10, 51 y.o.   MRN: [id number]  PCP: Scot Jun, FNP  Chief Complaint  Patient presents with  . Follow-up    1 MONTH    Subjective:  HPI  Derek Cruz is a 50 y.o. male presents for one month follow-up of newly diagnosed  Type 2 Diabetes.  Derek Cruz presents today reporting that he has been feeling much better since his last visit. Since his last visit he was [facility]. His most recent A1c was 8.2, and he was placed on metformin 1500 mg daily and saxaglipitin 2.5 mg daily. His blood sugar today is 89. He was given a prescription for home glucose monitoring kit however has not been performing home blood sugar monitoring. He denies any intolerances or side effects to medications. He also continues to deny any polydipsia, polyuria, polyphagia, or neuropathic pain in fingers or toes.   Social History   Social History  . Marital status: Married    Spouse name: N/A  . Number of children: N/A  . Years of education: N/A   Occupational History  . Not on file.   Social History Main Topics  . Smoking status: Former Research scientist (life sciences)  . Smokeless tobacco: Never Used  . Alcohol use Yes  . Drug use: No  . Sexual activity: Not on file   Other Topics Concern  . Not on file   Social History Narrative  . No narrative on file    Family History  Problem Relation Age of Onset  . Diabetes Mellitus II Neg Hx    Review of Systems See history of present illness Patient Active Problem List   Diagnosis Date Noted  . Dyslipidemia 10/06/2016  . Chest pain 09/26/2016  . Diabetes mellitus type 2, controlled ([place]) 09/26/2016    No Known Allergies  Prior to Admission medications   Medication Sig Start Date End Date Taking? Authorizing Provider  blood glucose meter kit and supplies KIT Dispense based on patient and insurance preference. Use up  to four times daily as directed. (FOR ICD-9 250.00, 250.01). 09/26/16  Yes Vann, Jessica U, DO  metFORMIN (GLUCOPHAGE) 500 MG tablet Take 1 tablet, 500 mg with breakfast 2 tablets (1,000 mg) with dinner. 10/02/16  Yes Scot Jun, FNP  omeprazole (PRILOSEC) 20 MG capsule Take 1 capsule (20 mg total) by mouth daily. 10/02/16  Yes Scot Jun, FNP  saxagliptin HCl (ONGLYZA) 2.5 MG TABS tablet Take 1 tablet (2.5 mg total) by mouth daily. 10/02/16  Yes Scot Jun, FNP    Past Medical, Surgical Family and Social History reviewed and updated.    Objective:   Today's Vitals   10/30/16 1527  BP: 126/74  Pulse: 67  Resp: 12  Temp: 98 F (36.7 C)  TempSrc: Oral  SpO2: 98%  Weight: 176 lb (79.8 kg)  Height: 5' 8"  (1.727 m)    Wt Readings from Last 3 Encounters:  10/30/16 176 lb (79.8 kg)  10/02/16 174 lb 12.8 oz (79.3 kg)  09/26/16 175 lb 11.2 oz (79.7 kg)   Physical Exam  Constitutional: He is oriented to person, place, and time. He appears well-developed and well-nourished.  HENT:  Head: Normocephalic.  Neck: Normal range of motion. Neck supple.  Cardiovascular: Normal rate, normal heart sounds and intact distal pulses.   Pulmonary/Chest: Effort normal and breath sounds normal.  Neurological: He is alert and oriented to  person, place, and time.  Skin: Skin is warm and dry.  Psychiatric: He has a normal mood and affect. His behavior is normal. Judgment and thought content normal.     Assessment & Plan:  1. Controlled type 2 diabetes mellitus with hyperglycemia, without long-term current use of insulin (HCC) -Continue medications as prescribed  RTC: Return in 3 months we will repeat an A1c at that time and make any necessary medication adjustments.  Carroll Sage. Kenton Kingfisher, MSN, FNP-C [facility]  [address] [place], [place] [address] [phone]

## 2016-10-30 NOTE — Patient Instructions (Addendum)
Plan de alimentacin DASH (DASH Eating Plan) DASH es la sigla en ingls de "Enfoques Alimentarios para Detener la Hipertensin". El plan de alimentacin DASH ha demostrado bajar la presin arterial elevada (hipertensin). Los beneficios adicionales para la salud pueden incluir la disminucin del riesgo de diabetes mellitus tipo2, enfermedades cardacas e ictus. Este plan tambin puede ayudar a Geophysical data processor. QU DEBO SABER ACERCA DEL PLAN DE ALIMENTACIN DASH? Para el plan de alimentacin DASH, seguir las siguientes pautas generales:  Elija los alimentos que contienen menos de 150 miligramos de sodio por porcin (segn se indica en la etiqueta de los alimentos).  Use hierbas o aderezos sin sal, en lugar de sal de mesa o sal marina.  Consulte al mdico o farmacutico antes de usar sustitutos de la sal.  Consuma los productos con menor contenido de sodio. Estos productos suelen estar etiquetados como "bajo en sodio" o "sin agregado de sal".  Coma alimentos frescos. No consuma una gran cantidad de alimentos enlatados.  Coma ms verduras, frutas y productos lcteos con bajo contenido de [place].  Elija los cereales integrales. Busque la palabra "integral" en Estate agent de la lista de ingredientes.  Elija el pescado y el pollo o el pavo sin piel ms a menudo que las carnes rojas. Limite el consumo de pescado, carne de ave y carne a 6onzas (170g) por Futures trader.  Limite el consumo de dulces, postres, azcares y bebidas azucaradas.  Elija las grasas saludables para el corazn.  Consuma ms comida casera y menos de restaurante, de buf y comida rpida.  Limite el consumo de alimentos fritos.  No fra los alimentos. A la hora de cocinarlos, opte por hornearlos, hervirlos, grillarlos y asarlos a Patent attorney.  Cuando coma en un restaurante, pida que preparen su comida con menos sal o, en lo posible, sin nada de sal. QU ALIMENTOS PUEDO COMER? Pida ayuda a un nutricionista para conocer las  necesidades calricas individuales. Cereales Pan de salvado o integral. Arroz integral. Pastas de salvado o integrales. Quinua, trigo burgol y cereales integrales. Cereales con bajo contenido de sodio. Tortillas de harina de maz o de salvado. Pan de maz integral. Galletas saladas integrales. Galletas con bajo contenido de [place]. Vegetales Verduras frescas o congeladas (crudas, al vapor, asadas o grilladas). Jugos de tomate y verduras con contenido bajo o reducido de sodio. Pasta y salsa de tomate con contenido bajo o reducido de sodio. Verduras enlatadas con bajo contenido de sodio o reducido de sodio. Nils Pyle Nils Pyle frescas, en conserva (en su jugo natural) o frutas congeladas. Carnes y otros productos con protenas Carne de res molida (al 85% o ms [place]), carne de res de animales alimentados con pastos o carne de res sin la grasa. Pollo o pavo sin piel. Carne de pollo o de [place]. Cerdo sin la grasa. Todos los pescados y frutos de mar. Huevos. Porotos, guisantes o lentejas secos. Frutos secos y semillas sin sal. Frijoles enlatados sin sal. Lcteos Productos lcteos con bajo contenido de grasas, como [place] o al 1%, quesos reducidos en grasas o al 2%, ricota con bajo contenido de grasas o [employer], o yogur natural con bajo contenido de [place]. Quesos con contenido bajo o reducido de sodio. Grasas y Writer en barra que no contengan grasas trans. [place]onesa y alios para ensaladas livianos o reducidos en grasas (reducidos en sodio). [place]. Aceites de crtamo, oliva o canola. Mantequilla natural de man o almendra. Otros Palomitas de maz y pretzels sin sal. Los artculos mencionados arriba pueden no  ser [employer] de las bebidas o los alimentos recomendados. Comunquese con el nutricionista para conocer ms opciones. QU ALIMENTOS NO SE RECOMIENDAN? Cereales Pan blanco. Pastas blancas. Arroz blanco. Pan de maz refinado. Bagels y croissants. Galletas  saladas que contengan grasas trans. Vegetales Vegetales con crema o fritos. Verduras en salsa de [place]. Verduras enlatadas comunes. Pasta y salsa de tomate en lata comunes. Jugos comunes de tomate y de verduras. Nils Pyle Fruta enlatada en almbar liviano o espeso. Jugo de frutas. Carnes y otros productos con protenas Cortes de carne con Holiday representative. Costillas, alas de pollo, tocineta, salchicha, mortadela, salame, chinchulines, tocino, perros calientes, salchichas alemanas y embutidos envasados. Frutos secos y semillas con sal. Frijoles con sal en lata. Lcteos Leche entera o al 2%, crema, mezcla de [place] y crema, y queso crema. Yogur entero o endulzado. Quesos o queso azul con alto contenido de Neurosurgeon. Cremas no lcteas y coberturas batidas. Quesos procesados, quesos para untar o cuajadas. Condimentos Sal de cebolla y ajo, sal condimentada, sal de mesa y sal marina. Salsas en lata y envasadas. Salsa [place]. Salsa trtara. Salsa barbacoa. Salsa teriyaki. Salsa de soja, incluso la que tiene contenido reducido de [place]. Salsa de carne. Salsa de pescado. Salsa de [place]. Salsa rosada. Rbano picante. Ketchup y mostaza. Saborizantes y tiernizantes para carne. Caldo en cubitos. Salsa picante. Salsa tabasco. Adobos. Aderezos para tacos. Salsas. Grasas y [address], [place] en barra, [place] de [place], [place], [place] clarificada y Steffanie Rainwater de tocino. Aceites de coco, de palmiste o de palma. Aderezos comunes para ensalada. Otros Pickles y [place]. Palomitas de maz y pretzels con sal. Los artculos mencionados arriba pueden no ser [employer] de las bebidas y los alimentos que se Theatre stage manager. Comunquese con el nutricionista para obtener ms informacin. DNDE Raelyn Mora MS INFORMACIN? Instituto Nacional del [place], del Pulmn y de Risk manager (National Heart, Lung, and Blood Institute): [email or web address] Esta informacin no tiene Microbiologist el consejo del mdico. Asegrese de hacerle al mdico cualquier pregunta que tenga. Document Released: 04/26/2011 Document Revised: 08/29/2015 Document Reviewed: 03/11/2013 Elsevier Interactive Patient Education  2017 Elsevier Inc.  La diabetes mellitus y los alimentos (Diabetes Mellitus and Food) Es importante que controle su nivel de azcar en la sangre (glucosa). El nivel de glucosa en sangre depende en gran medida de lo que usted come. Comer alimentos saludables en las cantidades [place] a lo largo del Futures trader, aproximadamente a la misma hora [employer], lo ayudar a Chief Operating Officer su nivel de Event organiser. Tambin puede ayudarlo a retrasar o Fish farm manager de la diabetes mellitus. Comer de [employer] saludable incluso puede ayudarlo a Event organiser de presin arterial y a Barista o Pharmacologist un peso saludable. Entre las recomendaciones generales para alimentarse y Water quality scientist los alimentos de forma saludable, se incluyen las siguientes:  Respetar las comidas principales y comer colaciones con regularidad. Evitar pasar largos perodos sin comer con el fin de perder peso.  Seguir una dieta que consista principalmente en alimentos de origen vegetal, como frutas, vegetales, frutos secos, legumbres y cereales integrales.  Utilizar mtodos de coccin a baja temperatura, como hornear, en lugar de mtodos de coccin a alta temperatura, como frer en abundante aceite. Trabaje con el nutricionista para aprender a Acupuncturist nutricional de las etiquetas de los alimentos. CMO PUEDEN AFECTARME LOS ALIMENTOS? Carbohidratos Los carbohidratos afectan el nivel de glucosa en sangre ms que cualquier otro tipo de alimento. El nutricionista lo ayudar a Chief Strategy Officer cuntos carbohidratos puede consumir  en cada comida y ensearle a contarlos. El recuento de carbohidratos es importante para mantener la glucosa en sangre en un nivel saludable, en especial si utiliza insulina o toma  determinados medicamentos para la diabetes mellitus. Alcohol El alcohol puede provocar disminuciones sbitas de la glucosa en sangre (hipoglucemia), en especial si utiliza insulina o toma determinados medicamentos para la diabetes mellitus. La hipoglucemia es una afeccin que puede poner en peligro la vida. Los sntomas de la hipoglucemia (somnolencia, mareos y Administratordesorientacin) son similares a los sntomas de haber consumido mucho alcohol. Si el mdico lo autoriza a beber alcohol, hgalo con moderacin y siga estas pautas:  Las mujeres no deben beber ms de un trago por da, y los hombres no deben beber ms de dos tragos por Futures traderda. Un trago es igual a: ? 12 onzas (355 ml) de cerveza ? 5 onzas de vino (150 ml) de vino ? 1,5onzas (45ml) de bebidas espirituosas  No beba con el estmago vaco.  Mantngase hidratado. Beba agua, gaseosas dietticas o t helado sin azcar.  Las gaseosas comunes, los jugos y otros refrescos podran contener muchos carbohidratos y se Heritage managerdeben contar. QU ALIMENTOS NO SE RECOMIENDAN? Cuando haga las elecciones de alimentos, es importante que recuerde que todos los alimentos son distintos. Algunos tienen menos nutrientes que otros por porcin, aunque podran tener la misma cantidad de caloras o carbohidratos. Es difcil darle al cuerpo lo que necesita cuando consume alimentos con menos nutrientes. Estos son algunos ejemplos de alimentos que debera evitar ya que contienen muchas caloras y carbohidratos, pero pocos nutrientes:  NeurosurgeonGrasas trans (la mayora de los alimentos procesados incluyen grasas trans en la etiqueta de Informacin nutricional).  Gaseosas comunes.  Jugos.  Caramelos.  Dulces, como tortas, pasteles, rosquillas y Wilmoregalletas.  Comidas fritas. QU ALIMENTOS PUEDO COMER? Consuma alimentos ricos en nutrientes, que nutrirn el cuerpo y lo mantendrn saludable. Los alimentos que debe comer tambin dependern de varios factores, como:  Las caloras que  necesita.  Los medicamentos que toma.  Su peso.  El nivel de glucosa en [place] Hotrontksangre.  El Round Valleynivel de presin arterial.  El nivel de colesterol. Debe consumir una amplia variedad de alimentos, por ejemplo:  Protenas. ? Cortes de Target Corporationcarne magros. ? Protenas con bajo contenido de grasas saturadas, como pescado, clara de huevo y frijoles. Evite las carnes procesadas.  Frutas y vegetales. ? Christmas IslandFrutas y Sports administratorvegetales que pueden ayudar a AGCO Corporationcontrolar los niveles sanguneos de [place], como [place] Mosswoodmanzanas, mangos y batatas.  Productos lcteos. ? Elija productos lcteos sin grasa o con bajo contenido de Camdengrasa, como [place]k Mountainleche, yogur y Orvistonqueso.  Cereales, panes, pastas y arroz. ? Elija cereales integrales, como panes multicereales, avena en grano y arroz integral. Estos alimentos pueden ayudar a controlar la presin arterial.  Rosalin HawkingGrasas. ? Alimentos que contengan grasas saludables, como frutos secos, Chartered certified accountantaguacate, aceite de [place] Creekoliva, aceite de canola y pescado. TODOS LOS QUE PADECEN DIABETES MELLITUS TIENEN EL MISMO PLAN DE COMIDAS? Dado que todas las personas que padecen diabetes mellitus son distintas, no hay un solo plan de comidas que funcione para todos. Es muy importante que se rena con un nutricionista que lo ayudar a crear un plan de comidas adecuado para usted. Esta informacin no tiene Theme park managercomo fin reemplazar el consejo del mdico. Asegrese de hacerle al mdico cualquier pregunta que tenga. Document Released: 08/14/2007 Document Revised: 05/28/2014 Document Reviewed: 04/03/2013 Elsevier Interactive Patient Education  2017 [employer]vier Inc.

## 2016-11-02 ENCOUNTER — Ambulatory Visit: Payer: Self-pay | Admitting: Family Medicine

## 2016-12-04 ENCOUNTER — Ambulatory Visit: Payer: Self-pay | Admitting: Family Medicine

## 2016-12-10 ENCOUNTER — Other Ambulatory Visit: Payer: Self-pay | Admitting: Family Medicine

## 2016-12-10 MED FILL — ?METFORMIN HCL 500MG TABLET: 500 | 30 days supply | Qty: 90 | Fill #2

## 2016-12-10 MED FILL — ONGLYZA 2.5 MG TABLET: 2.5 | 30 days supply | Qty: 30 | Fill #0

## 2016-12-10 MED FILL — ?OMEPRAZOLE DR 20 MG CAPSUL: 20 | 30 days supply | Qty: 30 | Fill #1

## 2017-01-15 MED FILL — ?OMEPRAZOLE DR 20 MG CAPSUL: 20 | 30 days supply | Qty: 30 | Fill #2

## 2017-01-15 MED FILL — ?METFORMIN HCL 500MG TABLET: 500 | 30 days supply | Qty: 90 | Fill #3

## 2017-01-15 MED FILL — ONGLYZA 2.5 MG TABLET: 2.5 | 30 days supply | Qty: 30 | Fill #1

## 2017-01-29 ENCOUNTER — Ambulatory Visit (INDEPENDENT_AMBULATORY_CARE_PROVIDER_SITE_OTHER): Payer: Self-pay | Admitting: Family Medicine

## 2017-01-29 ENCOUNTER — Encounter: Payer: Self-pay | Admitting: Family Medicine

## 2017-01-29 VITALS — BP 118/72 | HR 80 | Temp 98.7°F | Resp 14 | Ht 68.0 in | Wt 181.0 lb

## 2017-01-29 DIAGNOSIS — Z23 Encounter for immunization: Secondary | ICD-10-CM

## 2017-01-29 DIAGNOSIS — E1165 Type 2 diabetes mellitus with hyperglycemia: Secondary | ICD-10-CM

## 2017-01-29 DIAGNOSIS — E785 Hyperlipidemia, unspecified: Secondary | ICD-10-CM

## 2017-01-29 LAB — POCT URINALYSIS DIP (DEVICE)
Bilirubin Urine: NEGATIVE
GLUCOSE, UA: NEGATIVE mg/dL
Hgb urine dipstick: NEGATIVE
KETONES UR: NEGATIVE mg/dL
Leukocytes, UA: NEGATIVE
Nitrite: NEGATIVE
PROTEIN: NEGATIVE mg/dL
Specific Gravity, Urine: 1.015 (ref 1.005–1.030)
UROBILINOGEN UA: 0.2 mg/dL (ref 0.0–1.0)
pH: 5.5 (ref 5.0–8.0)

## 2017-01-29 LAB — POCT GLYCOSYLATED HEMOGLOBIN (HGB A1C): HEMOGLOBIN A1C: 5.8

## 2017-01-29 NOTE — Patient Instructions (Addendum)
You A1C has improved to 5.8 which is excellent!  Continue current medication regimen.   Colesterol (Cholesterol) El colesterol es una sustancia blanca y cerosa parecida a la grasa que el organismo necesita en pequeas cantidades. El hgado fabrica todo el colesterol que necesita. La sangre lo transporta desde el hgado a travs de los vasos sanguneos. Los depsitos de colesterol (placa) pueden acumularse en las paredes de los vasos sanguneos, lo que ocasiona el estrechamiento y la rigidez de las arterias. Las placas de colesterol aumentan el riesgo de ataque cardaco e ictus. Aunque sea muy elevado, la concentracin de colesterol no puede percibirse. La nica forma de saber que tiene colesterol alto es mediante un anlisis de Masonsangre. Una vez que se conocen las concentraciones de Oncologistcolesterol, se Tourist information centre managerdebe llevar un registro de los Mendonresultados de Sherwoodlos anlisis. Trabaje con el mdico para Goodrich Corporationmantener las concentraciones en el rango deseado. QU SIGNIFICAN LOS RESULTADOS?  El colesterol total es una medida general de todo el colesterol en La Fayettesangre.  El LDL es el que se conoce como colesterol malo y es el que se deposita en las paredes de las arterias. Su concentracin debe ser baja.  El HDL es el colesterol bueno porque limpia las arterias y arrastra el LDL. Su concentracin debe ser alta.  Los triglicridos son grasas que el cuerpo puede quemar ya sea para energa o para almacenamiento. Las concentraciones altas estn estrechamente vinculadas con las enfermedades cardacas.  CULES SON LAS CONCENTRACIONES DE COLESTEROL DESEADAS?  El colesterol total por debajo de 200.  El LDL por debajo de 100 en las personas en riesgo y por debajo de 70 en aquellas que corren un riesgo muy alto.  El HDL por Seychellesarriba de 50es bueno y por Seychellesarriba de 60 es lo mejor.  Los triglicridos por debajo de 150.  CMO PUEDO BAJAR EL COLESTEROL?  Dieta Siga los programas de alimentacin que el mdico le indique. ? Elija el  pescado o la carne blanca de pollo y Palmhurstpavo, asados u horneados. Limite los cortes grasos de carne roja, los alimentos fritos y las carnes procesadas, como las salchichas y los embutidos. ? Coma gran cantidad de frutas y verduras frescas. ? Elija los cereales integrales, los frijoles, las pastas, las papas y los cereales. ? Use solamente pequeas cantidades de aceite de oliva, maz o canola. ? No coma mantequilla, Fredericksburgmayonesa, margarina o aceites de Broadviewpalmiste. ? Evite los alimentos que contengan grasas trans. ? Hilton Hotelsome leche semidescremada o sin grasa y coma yogur y quesos bajos en grasas o sin grasa. Evite la Eastman Kodakleche entera, la crema, los Doua Anahelados, las yemas de Ilchesterhuevo y los quesos enteros. ? Los postres sanos incluyen la torta ngel, los bocadillos de Uticajengibre, las Gaffergalletas con forma de Naponeeanimales, los caramelos duros, los helados de agua y el yogur bajo en grasa o sin grasa. Evite las Falls Churchmasas, tortas, pasteles y Kappagalletas.  Haga actividad fsica. Siga los programas de ejercicios segn las indicaciones del mdico. ? Un programa regular ayuda a Advertising account executivebajar el colesterol LDL y aumentar el HDL, ? y a Art gallery managercontrolar el peso. ? Haga cosas que aumenten su nivel de Nageeziactividad, por Roanokeejemplo, haga Rocky Gapjardinera, salga a caminar o suba y baje las escaleras. Pregntele al mdico cmo puede aumentar la actividad en su vida cotidiana.  Medicamentos. Tome todos los medicamentos como le indic el mdico. ? El mdico puede recetarle medicamentos para ayudar a Advertising account executivebajar el colesterol y reducir el riesgo de enfermedades cardacas. ? Si tiene varios factores de riesgo, tal vez tenga  que tomar medicamentos, incluso si las concentraciones son normales.  Esta informacin no tiene Theme park manager el consejo del mdico. Asegrese de hacerle al mdico cualquier pregunta que tenga. Document Released: 02/14/2005 Document Revised: 05/28/2014 Document Reviewed: 11/05/2015 Elsevier Interactive Patient Education  2017 [employer].      La diabetes  mellitus y los alimentos (Diabetes Mellitus and Food) Es importante que controle su nivel de azcar en la sangre (glucosa). El nivel de glucosa en sangre depende en gran medida de lo que usted come. Comer alimentos saludables en las cantidades [place] a lo largo del Futures trader, aproximadamente a la misma hora [employer], lo ayudar a Chief Operating Officer su nivel de Event organiser. Tambin puede ayudarlo a retrasar o Fish farm manager de la diabetes mellitus. Comer de [employer] saludable incluso puede ayudarlo a Event organiser de presin arterial y a Barista o Pharmacologist un peso saludable. Entre las recomendaciones generales para alimentarse y Water quality scientist los alimentos de forma saludable, se incluyen las siguientes:  Respetar las comidas principales y comer colaciones con regularidad. Evitar pasar largos perodos sin comer con el fin de perder peso.  Seguir una dieta que consista principalmente en alimentos de origen vegetal, como frutas, vegetales, frutos secos, legumbres y cereales integrales.  Utilizar mtodos de coccin a baja temperatura, como hornear, en lugar de mtodos de coccin a alta temperatura, como frer en abundante aceite. Trabaje con el nutricionista para aprender a Acupuncturist nutricional de las etiquetas de los alimentos. CMO PUEDEN AFECTARME LOS ALIMENTOS? Carbohidratos Los carbohidratos afectan el nivel de glucosa en sangre ms que cualquier otro tipo de alimento. El nutricionista lo ayudar a Chief Strategy Officer cuntos carbohidratos puede consumir en cada comida y ensearle a contarlos. El recuento de carbohidratos es importante para mantener la glucosa en sangre en un nivel saludable, en especial si utiliza insulina o toma determinados medicamentos para la diabetes mellitus. Alcohol El alcohol puede provocar disminuciones sbitas de la glucosa en sangre (hipoglucemia), en especial si utiliza insulina o toma determinados medicamentos para la diabetes mellitus. La hipoglucemia es una  afeccin que puede poner en peligro la vida. Los sntomas de la hipoglucemia (somnolencia, mareos y Administrator) son similares a los sntomas de haber consumido mucho alcohol. Si el mdico lo autoriza a beber alcohol, hgalo con moderacin y siga estas pautas:  Las mujeres no deben beber ms de un trago por da, y los hombres no deben beber ms de dos tragos por Futures trader. Un trago es igual a: ? 12 onzas (355 ml) de cerveza ? 5 onzas de vino (150 ml) de vino ? 1,5onzas (45ml) de bebidas espirituosas  No beba con el estmago vaco.  Mantngase hidratado. Beba agua, gaseosas dietticas o t helado sin azcar.  Las gaseosas comunes, los jugos y otros refrescos podran contener muchos carbohidratos y se Heritage manager. QU ALIMENTOS NO SE RECOMIENDAN? Cuando haga las elecciones de alimentos, es importante que recuerde que todos los alimentos son distintos. Algunos tienen menos nutrientes que otros por porcin, aunque podran tener la misma cantidad de caloras o carbohidratos. Es difcil darle al cuerpo lo que necesita cuando consume alimentos con menos nutrientes. Estos son algunos ejemplos de alimentos que debera evitar ya que contienen muchas caloras y carbohidratos, pero pocos nutrientes:  Neurosurgeon trans (la mayora de los alimentos procesados incluyen grasas trans en la etiqueta de Informacin nutricional).  Gaseosas comunes.  Jugos.  Caramelos.  Dulces, como tortas, pasteles, rosquillas y [place].  Comidas fritas. QU ALIMENTOS PUEDO COMER? Consuma alimentos ricos en  nutrientes, que nutrirn el cuerpo y lo mantendrn saludable. Los alimentos que debe comer tambin dependern de varios factores, como:  Las caloras que necesita.  Los medicamentos que toma.  Su peso.  El nivel de glucosa en [place].  El [place] de presin arterial.  El nivel de colesterol. Debe consumir una amplia variedad de alimentos, por ejemplo:  Protenas. ? Cortes de [employer] Corporation. ? Protenas con bajo  contenido de grasas saturadas, como pescado, clara de huevo y frijoles. Evite las carnes procesadas.  Frutas y vegetales. ? [place] y Sports administrator que pueden ayudar a [employer] Corporation niveles sanguneos de [place], como [place], mangos y batatas.  Productos lcteos. ? Elija productos lcteos sin grasa o con bajo contenido de [place], como [place], yogur y [place].  Cereales, panes, pastas y arroz. ? Elija cereales integrales, como panes multicereales, avena en grano y arroz integral. Estos alimentos pueden ayudar a controlar la presin arterial.  Rosalin Hawking. ? Alimentos que contengan grasas saludables, como frutos secos, Chartered certified accountant, aceite de [place], aceite de canola y pescado. TODOS LOS QUE PADECEN DIABETES MELLITUS TIENEN EL MISMO PLAN DE COMIDAS? Dado que todas las personas que padecen diabetes mellitus son distintas, no hay un solo plan de comidas que funcione para todos. Es muy importante que se rena con un nutricionista que lo ayudar a crear un plan de comidas adecuado para usted. Esta informacin no tiene Theme park manager el consejo del mdico. Asegrese de hacerle al mdico cualquier pregunta que tenga. Document Released: 08/14/2007 Document Revised: 05/28/2014 Document Reviewed: 04/03/2013 Elsevier Interactive Patient Education  2017 [employer].

## 2017-01-29 NOTE — Progress Notes (Signed)
Patient ID: Derek Cruz, male    DOB: 1965/07/30, 51 y.o.   MRN: [id number]  PCP: Scot Jun, FNP  Chief Complaint  Patient presents with  . Follow-up    3 month    Subjective:  HPI Derek Cruz is a 51 y.o. male with Type 2 diabetes, presents for routine 3 month follow-up. Derek Cruz routinely monitors his glucose at home and reports readings consistently <200.  Adheres to current medication regimen. He reports no associated numbness or tingling of extremities, visual acuity changes, urinary frequency, or increased thirst.  Last A1C months prior 8.2. He has had a 5 lb weight gain since his last office visit. Current Body mass index is 27.52 kg/m.  He has incorportated walking twice weekly for 1 hour intervals in efforts to increase physical activity. Derek Cruz was found to have abnormal lipid panel during his last visit and was recommended to start statin therapy and medication was not initiated. Reports efforts to decreased intake of fat and sugar rich foods. He rarely drinks alcohol since diabetes diagnoses although in the past has consumed alcohol daily. Denies any other problems or concerns today. Social History   Social History  . Marital status: Married    Spouse name: N/A  . Number of children: N/A  . Years of education: N/A   Occupational History  . Not on file.   Social History Main Topics  . Smoking status: Former Research scientist (life sciences)  . Smokeless tobacco: Never Used  . Alcohol use Yes  . Drug use: No  . Sexual activity: Not on file   Other Topics Concern  . Not on file   Social History Narrative  . No narrative on file    Family History  Problem Relation Age of Onset  . Diabetes Mellitus II Neg Hx      Review of Systems  Constitutional: Negative.   Respiratory: Negative.   Cardiovascular: Negative.   Endocrine: Negative.    Patient Active Problem List   Diagnosis Date Noted  . Dyslipidemia 10/06/2016  . Chest pain  09/26/2016  . Diabetes mellitus type 2, controlled ([facility]) 09/26/2016    No Known Allergies  Prior to Admission medications   Medication Sig Start Date End Date Taking? Authorizing Provider  blood glucose meter kit and supplies KIT Dispense based on patient and insurance preference. Use up to four times daily as directed. (FOR ICD-9 250.00, 250.01). 09/26/16  Yes Vann, Jessica U, DO  metFORMIN (GLUCOPHAGE) 500 MG tablet Take 1 tablet, 500 mg with breakfast 2 tablets (1,000 mg) with dinner. 10/02/16  Yes Raydon Chappuis, Carroll Sage, FNP  ONGLYZA 2.5 MG TABS tablet TAKE 1 TABLET (2.5 MG TOTAL) BY MOUTH DAILY. 12/10/16  Yes Scot Jun, FNP  omeprazole (PRILOSEC) 20 MG capsule Take 1 capsule (20 mg total) by mouth daily. Patient not taking: Reported on 01/29/2017 10/02/16   Scot Jun, FNP    Past Medical, Surgical Family and Social History reviewed and updated.    Objective:   Today's Vitals   01/29/17 0832  BP: 118/72  Pulse: 80  Resp: 14  Temp: 98.7 F (37.1 C)  TempSrc: Oral  SpO2: 99%  Weight: 181 lb (82.1 kg)  Height: 5' 8"  (1.727 m)    Wt Readings from Last 3 Encounters:  01/29/17 181 lb (82.1 kg)  10/30/16 176 lb (79.8 kg)  10/02/16 174 lb 12.8 oz (79.3 kg)   Physical Exam  Constitutional: He is oriented to person, place, and time. He appears well-developed  and well-nourished.  HENT:  Head: Normocephalic and atraumatic.  Neck: Normal range of motion. Neck supple.  Cardiovascular: Normal rate, regular rhythm, normal heart sounds and intact distal pulses.   Pulmonary/Chest: Effort normal and breath sounds normal.  Musculoskeletal: Normal range of motion.  Neurological: He is alert and oriented to person, place, and time.  Psychiatric: He has a normal mood and affect. His behavior is normal. Judgment and thought content normal.   Assessment & Plan:  1. Controlled type 2 diabetes mellitus with hyperglycemia, without long-term current use of insulin ([facility]) controlled  today, 5.8. Continue current regimen. 2. Dyslipidemia, recheck lipid panel-if no improvement, will start statin therapy 3. Need for influenza vaccination - Flu Vaccine QUAD 36+ mos IM   Orders Placed This Encounter  Procedures  . Flu Vaccine QUAD 36+ mos IM  . Lipid panel  . Basic metabolic panel  . Microalbumin/Creatinine Ratio, Urine  . EXTRA LAV TOP TUBE  . POCT urinalysis dip (device)  . POCT glycosylated hemoglobin (Hb A1C)     Carroll Sage. Kenton Kingfisher, MSN, FNP-C [facility]  [address] [place], [place] [address] [phone]

## 2017-01-30 LAB — BASIC METABOLIC PANEL
BUN / CREAT RATIO: 21 (calc) (ref 6–22)
BUN: 13 mg/dL (ref 7–25)
CO2: 21 mmol/L (ref 20–32)
CREATININE: 0.63 mg/dL — AB (ref 0.70–1.33)
Calcium: 9.1 mg/dL (ref 8.6–10.3)
Chloride: 105 mmol/L (ref 98–110)
Glucose, Bld: 105 mg/dL — ABNORMAL HIGH (ref 65–99)
POTASSIUM: 4 mmol/L (ref 3.5–5.3)
SODIUM: 137 mmol/L (ref 135–146)

## 2017-01-30 LAB — LIPID PANEL
CHOLESTEROL: 209 mg/dL — AB (ref <200)
HDL: 32 mg/dL — ABNORMAL LOW (ref >40)
LDL Cholesterol (Calc): 143 mg/dL (calc) — ABNORMAL HIGH
Non-HDL Cholesterol (Calc): 177 mg/dL (calc) — ABNORMAL HIGH (ref <130)
Total CHOL/HDL Ratio: 6.5 (calc) — ABNORMAL HIGH (ref <5.0)
Triglycerides: 206 mg/dL — ABNORMAL HIGH (ref <150)

## 2017-01-30 LAB — MICROALBUMIN / CREATININE URINE RATIO
CREATININE, URINE: 61 mg/dL (ref 20–320)
MICROALB UR: 0.2 mg/dL
Microalb Creat Ratio: 3 mcg/mg creat (ref <30)

## 2017-01-30 LAB — EXTRA LAV TOP TUBE

## 2017-01-31 ENCOUNTER — Telehealth: Payer: Self-pay | Admitting: Family Medicine

## 2017-01-31 MED ORDER — PRAVASTATIN SODIUM 40 MG PO TABS: 40.0000 mg | ORAL_TABLET | Freq: Every day | ORAL | 3 refills | Status: DC

## 2017-01-31 MED ORDER — ASPIRIN 81 MG PO TABS: 81.0000 mg | ORAL_TABLET | Freq: Every day | ORAL | 5 refills | Status: DC

## 2017-01-31 NOTE — Telephone Encounter (Signed)
Contact patient to advise his cholesterol levels remain abnormal and I am starting him Pravachol 40 mg and Aspirin 81 mg. He was previously told to start simvastatin, however I do not see that medication was e-prescribed.  Derek PickKimberly S. Tiburcio PeaHarris, MSN, FNP-C [facility]  [address] [place], [place] [address] [phone]

## 2017-01-31 NOTE — Telephone Encounter (Signed)
Patient notified and will pick up medications from pharmacy

## 2017-03-25 ENCOUNTER — Other Ambulatory Visit: Payer: Self-pay | Admitting: Family Medicine

## 2017-03-25 MED FILL — ?METFORMIN HCL 500MG TABLET: 500 | 10 days supply | Qty: 30 | Fill #4

## 2017-03-25 MED FILL — ONGLYZA 2.5 MG TABLET: 2.5 | 30 days supply | Qty: 30 | Fill #0

## 2017-07-29 ENCOUNTER — Encounter: Payer: Self-pay | Admitting: Family Medicine

## 2017-07-29 ENCOUNTER — Ambulatory Visit (INDEPENDENT_AMBULATORY_CARE_PROVIDER_SITE_OTHER): Payer: Self-pay | Admitting: Family Medicine

## 2017-07-29 VITALS — BP 140/86 | HR 88 | Temp 98.2°F | Ht 68.0 in | Wt 188.0 lb

## 2017-07-29 DIAGNOSIS — Z130 Encounter for screening for diseases of the blood and blood-forming organs and certain disorders involving the immune mechanism: Secondary | ICD-10-CM

## 2017-07-29 DIAGNOSIS — E785 Hyperlipidemia, unspecified: Secondary | ICD-10-CM

## 2017-07-29 DIAGNOSIS — E118 Type 2 diabetes mellitus with unspecified complications: Secondary | ICD-10-CM

## 2017-07-29 LAB — POCT URINALYSIS DIP (DEVICE)
Bilirubin Urine: NEGATIVE
Glucose, UA: NEGATIVE mg/dL
Hgb urine dipstick: NEGATIVE
Ketones, ur: NEGATIVE mg/dL
Leukocytes, UA: NEGATIVE
NITRITE: NEGATIVE
PH: 5.5 (ref 5.0–8.0)
PROTEIN: NEGATIVE mg/dL
Specific Gravity, Urine: 1.01 (ref 1.005–1.030)
Urobilinogen, UA: 0.2 mg/dL (ref 0.0–1.0)

## 2017-07-29 LAB — POCT GLYCOSYLATED HEMOGLOBIN (HGB A1C): HEMOGLOBIN A1C: 7.2

## 2017-07-29 MED ORDER — PRAVASTATIN SODIUM 40 MG PO TABS: 40.0000 mg | ORAL_TABLET | Freq: Every day | ORAL | 3 refills | Status: DC

## 2017-07-29 MED ORDER — ASPIRIN EC 81 MG PO TBEC: 81.0000 mg | DELAYED_RELEASE_TABLET | Freq: Every day | ORAL | 1 refills | Status: DC

## 2017-07-29 MED ORDER — LISINOPRIL 5 MG PO TABS: 5.0000 mg | ORAL_TABLET | Freq: Every day | ORAL | 3 refills | Status: DC

## 2017-07-29 MED ORDER — METFORMIN HCL 1000 MG PO TABS: ORAL_TABLET | ORAL | 1 refills | Status: DC

## 2017-07-29 NOTE — Progress Notes (Signed)
Patient ID: Derek Cruz, male    DOB: 1965/06/23, 52 y.o.   MRN: [id number]  PCP: Scot Jun, FNP  Chief Complaint  Patient presents with  . Follow-up    diabetes    Subjective:  HPI Drevion Travis Mastel is a 52 y.o. male presents for evaluation of diabetes and hypertension. Medical problems significant for controlled type 2 diabetes, overweight, and dyslipidemia. Last follow-up September 2018. A1C 5.8. Today he reports that he stopped taking medications around two months ago. He has not checked his blood sugar. Denies polyuria, polydipsia, or polyphagia. He has been inactive of exercise recently, although plans to resume walking as weather is improving. Vinnie alsos uffers from dyslipidemia and reports adherence to pravastatin although reports no major dietary restrictions.  Denies chest pain, shortness of breath, dizziness, weakness, or headaches.  Social History   Socioeconomic History  . Marital status: Married    Spouse name: Not on file  . Number of children: Not on file  . Years of education: Not on file  . Highest education level: Not on file  Social Needs  . Financial resource strain: Not on file  . Food insecurity - worry: Not on file  . Food insecurity - inability: Not on file  . Transportation needs - medical: Not on file  . Transportation needs - non-medical: Not on file  Occupational History  . Not on file  Tobacco Use  . Smoking status: Former Research scientist (life sciences)  . Smokeless tobacco: Never Used  Substance and Sexual Activity  . Alcohol use: Yes  . Drug use: No  . Sexual activity: Not on file  Other Topics Concern  . Not on file  Social History Narrative  . Not on file    Family History  Problem Relation Age of Onset  . Diabetes Mellitus II Neg Hx    Review of Systems Pertinent negatives included in HPI Patient Active Problem List   Diagnosis Date Noted  . Dyslipidemia 10/06/2016  . Chest pain 09/26/2016  . Diabetes mellitus  type 2, controlled ([facility]) 09/26/2016    No Known Allergies  Prior to Admission medications   Medication Sig Start Date End Date Taking? Authorizing Provider  blood glucose meter kit and supplies KIT Dispense based on patient and insurance preference. Use up to four times daily as directed. (FOR ICD-9 250.00, 250.01). 09/26/16  Yes Vann, Jessica U, DO  metFORMIN (GLUCOPHAGE) 500 MG tablet Take 1 tablet, 500 mg with breakfast 2 tablets (1,000 mg) with dinner. 10/02/16  Yes Lynnie Koehler, Carroll Sage, FNP  ONGLYZA 2.5 MG TABS tablet TAKE 1 TABLET (2.5 MG TOTAL) BY MOUTH DAILY. 03/25/17  Yes Scot Jun, FNP  aspirin 81 MG tablet Take 1 tablet (81 mg total) by mouth daily. Patient not taking: Reported on 07/29/2017 01/31/17   Scot Jun, FNP  omeprazole (PRILOSEC) 20 MG capsule Take 1 capsule (20 mg total) by mouth daily. Patient not taking: Reported on 01/29/2017 10/02/16   Scot Jun, FNP  pravastatin (PRAVACHOL) 40 MG tablet Take 1 tablet (40 mg total) by mouth daily. Patient not taking: Reported on 07/29/2017 01/31/17   Scot Jun, FNP    Past Medical, Surgical Family and Social History reviewed and updated.    Objective:   Today's Vitals   07/29/17 1530  BP: 140/86  Pulse: 88  Temp: 98.2 F (36.8 C)  TempSrc: Oral  SpO2: 100%  Weight: 188 lb (85.3 kg)  Height: 5' 8"  (1.727 m)    Wt  Readings from Last 3 Encounters:  07/29/17 188 lb (85.3 kg)  01/29/17 181 lb (82.1 kg)  10/30/16 176 lb (79.8 kg)    Physical Exam Constitutional: Patient appears well-developed and well-nourished. No distress. HENT: Normocephalic, atraumatic, External right and left ear normal. Oropharynx is clear and moist.  Eyes: Conjunctivae and EOM are normal. PERRLA, no scleral icterus. Neck: Normal ROM. Neck supple. No JVD. No tracheal deviation. No thyromegaly. CVS: RRR, S1/S2 +, no murmurs, no gallops, no carotid bruit.  Pulmonary: Effort and breath sounds normal, no stridor, rhonchi,  wheezes, rales.  Abdominal: Soft. BS +, no distension, tenderness, rebound or guarding.  Musculoskeletal: Normal range of motion. No edema and no tenderness.  Neuro: Alert. Normal reflexes, muscle tone coordination.  Skin: Skin is warm and dry. No rash noted. Not diaphoretic. No erythema. No pallor. Psychiatric: Normal mood and affect. Behavior, judgment, thought content normal.   Assessment & Plan:  1. Dyslipidemia, not fasting today. Return in 2 weeks for fasting lipid panel. If remains uncontrolled, increase Pravastatin 80 mg once daily. Encouraged to take medication at 1800 daily. - Lipid panel; Future - Thyroid Panel With TSH; Future  -Encourage resuming once daily aspirin 81 mg for cardiovascular protection.  The 10-year ASCVD risk score Mikey Bussing DC Brooke Bonito., et al., 2013) is: 13.8%   Values used to calculate the score:     Age: 43 years     Sex: Male     Is Non-Hispanic African American: No     Diabetic: Yes     Tobacco smoker: No     Systolic Blood Pressure: 381 mmHg     Is BP treated: No     HDL Cholesterol: 32 mg/dL     Total Cholesterol: 209 mg/dL   2. Type 2 diabetes mellitus with complication, without long-term current use of insulin ([facility]), slightly above goal with A1C 7.2 compared to prior A1C 5.8. Resume metformin at increased dose of 1000 mg twice daily with meals. Encouraged reducing intake of simple carbohydrates and sugar sweetened beverages. Engage in physical activity with goal of 150 minutes per week. Starting lisinopril for renal protection although BP is elevated today. Checking comprehensive metabolic panel; Future   3. Screening for deficiency anemia, CBC with Differential; Future   Meds ordered this encounter  Medications  . metFORMIN (GLUCOPHAGE) 1000 MG tablet    Sig: Take 1 tablet with meals twice daily.    Dispense:  180 tablet    Refill:  1    Order Specific Question:   Supervising Provider    Answer:   Tresa Garter [id number]  . pravastatin  (PRAVACHOL) 40 MG tablet    Sig: Take 1 tablet (40 mg total) by mouth daily.    Dispense:  90 tablet    Refill:  3    Order Specific Question:   Supervising Provider    Answer:   Tresa Garter [id number]  . lisinopril (PRINIVIL,ZESTRIL) 5 MG tablet    Sig: Take 1 tablet (5 mg total) by mouth daily.    Dispense:  90 tablet    Refill:  3    Order Specific Question:   Supervising Provider    Answer:   Tresa Garter [id number]  . aspirin EC 81 MG tablet    Sig: Take 1 tablet (81 mg total) by mouth daily.    Dispense:  90 tablet    Refill:  1    Order Specific Question:   Supervising Provider    Answer:  JEGEDE, OLUGBEMIGA E [[id number]]    RTC: 2 weeks for fasting labs and 6 months for chronic condition follow-up.    Carroll Sage. Kenton Kingfisher, MSN, FNP-C [facility]  [address] [place], [place] [address] [phone]

## 2017-07-29 NOTE — Patient Instructions (Signed)
Your blood pressure is elevated today. I am starting you on Lisinopril 5 mg once daily. I recommend resuming aspirin 81 mg once daily. Metformin 1000 mg twice daily with meals. I encourage physical activity with a goal of 150 minutes per week.

## 2017-08-09 ENCOUNTER — Other Ambulatory Visit: Payer: Self-pay

## 2017-08-09 DIAGNOSIS — E785 Hyperlipidemia, unspecified: Secondary | ICD-10-CM

## 2017-08-09 DIAGNOSIS — Z130 Encounter for screening for diseases of the blood and blood-forming organs and certain disorders involving the immune mechanism: Secondary | ICD-10-CM

## 2017-08-09 DIAGNOSIS — R748 Abnormal levels of other serum enzymes: Secondary | ICD-10-CM

## 2017-08-09 DIAGNOSIS — E118 Type 2 diabetes mellitus with unspecified complications: Secondary | ICD-10-CM

## 2017-08-10 LAB — CBC WITH DIFFERENTIAL/PLATELET
Basophils Absolute: 0 10*3/uL (ref 0.0–0.2)
Basos: 1 %
EOS (ABSOLUTE): 0.2 10*3/uL (ref 0.0–0.4)
EOS: 2 %
HEMATOCRIT: 45.1 % (ref 37.5–51.0)
HEMOGLOBIN: 15.2 g/dL (ref 13.0–17.7)
IMMATURE GRANS (ABS): 0 10*3/uL (ref 0.0–0.1)
IMMATURE GRANULOCYTES: 0 %
LYMPHS: 30 %
Lymphocytes Absolute: 2.4 10*3/uL (ref 0.7–3.1)
MCH: 32.3 pg (ref 26.6–33.0)
MCHC: 33.7 g/dL (ref 31.5–35.7)
MCV: 96 fL (ref 79–97)
MONOCYTES: 6 %
Monocytes Absolute: 0.4 10*3/uL (ref 0.1–0.9)
NEUTROS PCT: 61 %
Neutrophils Absolute: 5 10*3/uL (ref 1.4–7.0)
Platelets: 300 10*3/uL (ref 150–379)
RBC: 4.71 x10E6/uL (ref 4.14–5.80)
RDW: 14.9 % (ref 12.3–15.4)
WBC: 8 10*3/uL (ref 3.4–10.8)

## 2017-08-10 LAB — THYROID PANEL WITH TSH
Free Thyroxine Index: 1.8 (ref 1.2–4.9)
T3 UPTAKE RATIO: 26 % (ref 24–39)
T4 TOTAL: 6.8 ug/dL (ref 4.5–12.0)
TSH: 2.1 u[IU]/mL (ref 0.450–4.500)

## 2017-08-10 LAB — COMPREHENSIVE METABOLIC PANEL
ALK PHOS: 99 IU/L (ref 39–117)
ALT: 70 IU/L — AB (ref 0–44)
AST: 48 IU/L — AB (ref 0–40)
Albumin/Globulin Ratio: 1.8 (ref 1.2–2.2)
Albumin: 4.6 g/dL (ref 3.5–5.5)
BUN/Creatinine Ratio: 14 (ref 9–20)
BUN: 10 mg/dL (ref 6–24)
Bilirubin Total: 0.4 mg/dL (ref 0.0–1.2)
CALCIUM: 9.5 mg/dL (ref 8.7–10.2)
CO2: 20 mmol/L (ref 20–29)
CREATININE: 0.73 mg/dL — AB (ref 0.76–1.27)
Chloride: 104 mmol/L (ref 96–106)
GFR calc Af Amer: 124 mL/min/{1.73_m2} (ref >59)
GFR, EST NON AFRICAN AMERICAN: 107 mL/min/{1.73_m2} (ref >59)
GLOBULIN, TOTAL: 2.6 g/dL (ref 1.5–4.5)
GLUCOSE: 82 mg/dL (ref 65–99)
Potassium: 4 mmol/L (ref 3.5–5.2)
SODIUM: 140 mmol/L (ref 134–144)
Total Protein: 7.2 g/dL (ref 6.0–8.5)

## 2017-08-10 LAB — LIPID PANEL
CHOL/HDL RATIO: 4.6 ratio (ref 0.0–5.0)
Cholesterol, Total: 169 mg/dL (ref 100–199)
HDL: 37 mg/dL — AB (ref >39)
LDL Calculated: 92 mg/dL (ref 0–99)
TRIGLYCERIDES: 198 mg/dL — AB (ref 0–149)
VLDL CHOLESTEROL CAL: 40 mg/dL (ref 5–40)

## 2017-08-15 NOTE — Telephone Encounter (Signed)
Note not needed 

## 2017-08-16 ENCOUNTER — Telehealth: Payer: Self-pay | Admitting: Family Medicine

## 2017-08-16 ENCOUNTER — Encounter: Payer: Self-pay | Admitting: Family Medicine

## 2017-08-16 NOTE — Progress Notes (Signed)
erroneous

## 2017-08-16 NOTE — Telephone Encounter (Signed)
erroneous

## 2017-08-18 NOTE — Progress Notes (Signed)
Liver function and hepatitis labs pending

## 2018-01-31 ENCOUNTER — Ambulatory Visit: Payer: Self-pay | Admitting: Family Medicine

## 2018-05-20 ENCOUNTER — Emergency Department (HOSPITAL_COMMUNITY)
Admission: EM | Admit: 2018-05-20 | Discharge: 2018-05-20 | Disposition: A | Discharge status: Home / Self Care | Payer: Self-pay | Attending: Emergency Medicine | Admitting: Emergency Medicine

## 2018-05-20 ENCOUNTER — Encounter (HOSPITAL_COMMUNITY): Payer: Self-pay | Admitting: *Deleted

## 2018-05-20 ENCOUNTER — Other Ambulatory Visit: Payer: Self-pay

## 2018-05-20 DIAGNOSIS — K2900 Acute gastritis without bleeding: Secondary | ICD-10-CM | POA: Insufficient documentation

## 2018-05-20 DIAGNOSIS — E119 Type 2 diabetes mellitus without complications: Secondary | ICD-10-CM | POA: Insufficient documentation

## 2018-05-20 DIAGNOSIS — Z87891 Personal history of nicotine dependence: Secondary | ICD-10-CM | POA: Insufficient documentation

## 2018-05-20 DIAGNOSIS — E785 Hyperlipidemia, unspecified: Secondary | ICD-10-CM | POA: Insufficient documentation

## 2018-05-20 LAB — COMPREHENSIVE METABOLIC PANEL
ALT: 26 U/L (ref 0–44)
AST: 32 U/L (ref 15–41)
Albumin: 4.4 g/dL (ref 3.5–5.0)
Alkaline Phosphatase: 73 U/L (ref 38–126)
Anion gap: 13 (ref 5–15)
BUN: 9 mg/dL (ref 6–20)
CO2: 22 mmol/L (ref 22–32)
Calcium: 9.1 mg/dL (ref 8.9–10.3)
Chloride: 102 mmol/L (ref 98–111)
Creatinine, Ser: 0.72 mg/dL (ref 0.61–1.24)
GFR calc Af Amer: >60 mL/min (ref >60)
GFR calc non Af Amer: >60 mL/min (ref >60)
Glucose, Bld: 105 mg/dL — ABNORMAL HIGH (ref 70–99)
Potassium: 3.7 mmol/L (ref 3.5–5.1)
Sodium: 137 mmol/L (ref 135–145)
Total Bilirubin: 1.1 mg/dL (ref 0.3–1.2)
Total Protein: 7.8 g/dL (ref 6.5–8.1)

## 2018-05-20 LAB — CBC WITH DIFFERENTIAL/PLATELET
Abs Immature Granulocytes: 0.04 10*3/uL (ref 0.00–0.07)
Basophils Absolute: 0.1 10*3/uL (ref 0.0–0.1)
Basophils Relative: 1 %
Eosinophils Absolute: 0 10*3/uL (ref 0.0–0.5)
Eosinophils Relative: 0 %
HCT: 48.6 % (ref 39.0–52.0)
Hemoglobin: 17 g/dL (ref 13.0–17.0)
Immature Granulocytes: 0 %
Lymphocytes Relative: 17 %
Lymphs Abs: 1.8 10*3/uL (ref 0.7–4.0)
MCH: 32.2 pg (ref 26.0–34.0)
MCHC: 35 g/dL (ref 30.0–36.0)
MCV: 92 fL (ref 80.0–100.0)
Monocytes Absolute: 0.5 10*3/uL (ref 0.1–1.0)
Monocytes Relative: 5 %
Neutro Abs: 8.4 10*3/uL — ABNORMAL HIGH (ref 1.7–7.7)
Neutrophils Relative %: 77 %
Platelets: 277 10*3/uL (ref 150–400)
RBC: 5.28 MIL/uL (ref 4.22–5.81)
RDW: 13.8 % (ref 11.5–15.5)
WBC: 10.9 10*3/uL — ABNORMAL HIGH (ref 4.0–10.5)
nRBC: 0 % (ref 0.0–0.2)

## 2018-05-20 LAB — LIPASE, BLOOD: Lipase: 20 U/L (ref 11–51)

## 2018-05-20 MED ORDER — MORPHINE SULFATE (PF) 4 MG/ML IV SOLN
4.0000 mg | Freq: Once | INTRAVENOUS | Status: AC
  Administered 2018-05-20: 4 mg via INTRAVENOUS
  Filled 2018-05-20: qty 1

## 2018-05-20 MED ORDER — RANITIDINE HCL 150 MG/10ML PO SYRP
150.0000 mg | ORAL_SOLUTION | Freq: Once | ORAL | Status: AC
  Administered 2018-05-20: 150 mg via ORAL
  Filled 2018-05-20: qty 10

## 2018-05-20 MED ORDER — ONDANSETRON 4 MG PO TBDP: 4.0000 mg | ORAL_TABLET | Freq: Once | ORAL | Status: DC

## 2018-05-20 MED ORDER — ONDANSETRON HCL 4 MG/2ML IJ SOLN
4.0000 mg | Freq: Once | INTRAMUSCULAR | Status: AC
  Administered 2018-05-20: 4 mg via INTRAVENOUS
  Filled 2018-05-20: qty 2

## 2018-05-20 MED ORDER — FAMOTIDINE 20 MG PO TABS: 20.0000 mg | ORAL_TABLET | Freq: Two times a day (BID) | ORAL | 0 refills | Status: DC

## 2018-05-20 MED ORDER — SODIUM CHLORIDE 0.9 % IV BOLUS
1000.0000 mL | Freq: Once | INTRAVENOUS | Status: AC
  Administered 2018-05-20: 1000 mL via INTRAVENOUS

## 2018-05-20 NOTE — ED Notes (Signed)
Pt discharged from ED; instructions provided and scripts given; Pt encouraged to return to ED if symptoms worsen and to f/u with PCP; Pt verbalized understanding of all instructions 

## 2018-05-20 NOTE — Discharge Instructions (Addendum)
Please read attached information. If you experience any new or worsening signs or symptoms please return to the emergency room for evaluation. Please follow-up with your primary care provider or specialist as discussed. Please use medication prescribed only as directed and discontinue taking if you have any concerning signs or symptoms.   °

## 2018-05-20 NOTE — ED Provider Notes (Signed)
[facility] EMERGENCY DEPARTMENT Provider Note   CSN: [id number] Arrival date & time: 05/20/18  1022     History   Chief Complaint Chief Complaint  Patient presents with  . Abdominal Pain    HPI Derek Cruz is a 52 y.o. male.  HPI     52 year old male presents today with complaints of epigastric abdominal pain.  Patient notes him started this morning after eating oatmeal.  Notes pain in the epigastric region, notes some nonbloody vomiting and small amount of diarrhea.  She notes he has not needed to eat or drink since then.  Denies any fever.  He notes he has had symptoms previously approximately 6 months ago and 3 months ago.  Patient notes that he does drink alcohol but does not use any aspirin ibuprofen.    History reviewed. No pertinent past medical history.  Patient Active Problem List   Diagnosis Date Noted  . Dyslipidemia 10/06/2016  . Chest pain 09/26/2016  . Diabetes mellitus type 2, controlled ([facility]) 09/26/2016    History reviewed. No pertinent surgical history.      Home Medications    Prior to Admission medications   Medication Sig Start Date End Date Taking? Authorizing Provider  aspirin 81 MG tablet Take 1 tablet (81 mg total) by mouth daily. Patient not taking: Reported on 07/29/2017 01/31/17   Scot Jun, FNP  blood glucose meter kit and supplies KIT Dispense based on patient and insurance preference. Use up to four times daily as directed. (FOR ICD-9 250.00, 250.01). 09/26/16   Geradine Girt, DO  famotidine (PEPCID) 20 MG tablet Take 1 tablet (20 mg total) by mouth 2 (two) times daily. 05/20/18   Demonica Farrey, Dellis Filbert, PA-C  lisinopril (PRINIVIL,ZESTRIL) 5 MG tablet Take 1 tablet (5 mg total) by mouth daily. Patient not taking: Reported on 05/20/2018 07/29/17   Scot Jun, FNP  metFORMIN (GLUCOPHAGE) 1000 MG tablet Take 1 tablet with meals twice daily. Patient taking differently: Take 1,000 mg by mouth 2  (two) times daily with a meal.  07/29/17   Scot Jun, FNP  pravastatin (PRAVACHOL) 40 MG tablet Take 1 tablet (40 mg total) by mouth daily. Patient not taking: Reported on 05/20/2018 07/29/17   Scot Jun, FNP    Family History Family History  Problem Relation Age of Onset  . Diabetes Mellitus II Neg Hx     Social History Social History   Tobacco Use  . Smoking status: Former Research scientist (life sciences)  . Smokeless tobacco: Never Used  Substance Use Topics  . Alcohol use: Yes  . Drug use: No     Allergies   Patient has no known allergies.   Review of Systems Review of Systems  All other systems reviewed and are negative.    Physical Exam Updated Vital Signs BP (!) 146/97   Pulse 91   Temp 98.3 F (36.8 C) (Oral)   Resp 16   SpO2 94%   Physical Exam Vitals signs and nursing note reviewed.  Constitutional:      Appearance: He is well-developed.  HENT:     Head: Normocephalic and atraumatic.  Eyes:     General: No scleral icterus.       Right eye: No discharge.        Left eye: No discharge.     Conjunctiva/sclera: Conjunctivae normal.     Pupils: Pupils are equal, round, and reactive to light.  Neck:     Musculoskeletal: Normal range of motion.  Vascular: No JVD.     Trachea: No tracheal deviation.  Pulmonary:     Effort: Pulmonary effort is normal.     Breath sounds: No stridor.  Abdominal:     Comments: Tenderness to palpation of epigastric region remainder abdomen soft nontender  Neurological:     Mental Status: He is alert and oriented to person, place, and time.     Coordination: Coordination normal.  Psychiatric:        Behavior: Behavior normal.        Thought Content: Thought content normal.        Judgment: Judgment normal.      ED Treatments / Results  Labs (all labs ordered are listed, but only abnormal results are displayed) Labs Reviewed  CBC WITH DIFFERENTIAL/PLATELET - Abnormal; Notable for the following components:      Result  Value   WBC 10.9 (*)    Neutro Abs 8.4 (*)    All other components within normal limits  COMPREHENSIVE METABOLIC PANEL - Abnormal; Notable for the following components:   Glucose, Bld 105 (*)    All other components within normal limits  LIPASE, BLOOD    EKG None  Radiology No results found.  Procedures Procedures (including critical care time)  Medications Ordered in ED Medications  ranitidine (ZANTAC) 150 MG/10ML syrup 150 mg (150 mg Oral Given 05/20/18 1333)  sodium chloride 0.9 % bolus 1,000 mL (1,000 mLs Intravenous New Bag/Given 05/20/18 1318)  ondansetron (ZOFRAN) injection 4 mg (4 mg Intravenous Given 05/20/18 1319)  morphine 4 MG/ML injection 4 mg (4 mg Intravenous Given 05/20/18 1319)     Initial Impression / Assessment and Plan / ED Course  I have reviewed the triage vital signs and the nursing notes.  Pertinent labs & imaging results that were available during my care of the patient were reviewed by me and considered in my medical decision making (see chart for details).    Labs: CBC, CMP,, lipase  Imaging:  Consults:  Therapeutics: Zantac, morphine  Discharge Meds: Pepcid  Assessment/Plan:   52 year old male presents today with likely gastritis.  Patient is afebrile with white count of 10.9.  He was given pain medication and Zantac with complete resolution of his symptoms.  He has no abdominal pain on palpation no suspicion for any significant intra-abdominal pathology.  Patient will be discharged home with H2 antihistamine, outpatient follow-up and strict return precautions.  Verbalized understanding and agreement to today's plan.  Final Clinical Impressions(s) / ED Diagnoses   Final diagnoses:  Acute gastritis without hemorrhage, unspecified gastritis type    ED Discharge Orders         Ordered    famotidine (PEPCID) 20 MG tablet  2 times daily     05/20/18 1525           Okey Regal, PA-C 05/20/18 1528    Malvin Johns,  MD 05/20/18 1842

## 2018-05-20 NOTE — ED Triage Notes (Signed)
Pt in c/o generalized abdominal pain with n/v that started yesterday, states this has happened before but he does not remember what it was called, no distress noted

## 2018-05-23 ENCOUNTER — Encounter: Payer: Self-pay | Admitting: Family Medicine

## 2018-05-23 ENCOUNTER — Ambulatory Visit (INDEPENDENT_AMBULATORY_CARE_PROVIDER_SITE_OTHER): Payer: Self-pay | Admitting: Family Medicine

## 2018-05-23 VITALS — BP 144/96 | HR 80 | Temp 98.0°F | Ht 68.0 in | Wt 169.0 lb

## 2018-05-23 DIAGNOSIS — K2970 Gastritis, unspecified, without bleeding: Secondary | ICD-10-CM

## 2018-05-23 DIAGNOSIS — R110 Nausea: Secondary | ICD-10-CM

## 2018-05-23 DIAGNOSIS — R7303 Prediabetes: Secondary | ICD-10-CM

## 2018-05-23 DIAGNOSIS — Z09 Encounter for follow-up examination after completed treatment for conditions other than malignant neoplasm: Secondary | ICD-10-CM

## 2018-05-23 LAB — POCT GLYCOSYLATED HEMOGLOBIN (HGB A1C): Hemoglobin A1C: 5.8 % — AB (ref 4.0–5.6)

## 2018-05-23 LAB — GLUCOSE, POCT (MANUAL RESULT ENTRY): POC Glucose: 140 mg/dl — AB (ref 70–99)

## 2018-05-23 LAB — POCT URINALYSIS DIP (MANUAL ENTRY)
Bilirubin, UA: NEGATIVE
Blood, UA: NEGATIVE
Glucose, UA: NEGATIVE mg/dL
Ketones, POC UA: NEGATIVE mg/dL
Leukocytes, UA: NEGATIVE
Nitrite, UA: NEGATIVE
Protein Ur, POC: NEGATIVE mg/dL
Spec Grav, UA: 1.01 (ref 1.010–1.025)
Urobilinogen, UA: 0.2 E.U./dL
pH, UA: 6.5 (ref 5.0–8.0)

## 2018-05-23 MED ORDER — ONDANSETRON HCL 4 MG PO TABS: 4.0000 mg | ORAL_TABLET | Freq: Three times a day (TID) | ORAL | 2 refills | Status: DC | PRN

## 2018-05-23 NOTE — Progress Notes (Signed)
Follow Up  Subjective:    Patient ID: Derek Cruz, male    DOB: 01-01-1966, 53 y.o.   MRN: [id number]   Chief Complaint  Patient presents with  . Hospitalization Follow-up   HPI  [name] is a 53 year old male with a past medical history of Dyslipidemia, Gastritis, Chest Pain, and Diabetes. He is here today for follow up.   Current Status: Since his last office visit, he has had a ED visit for Gastritis on 05/20/2018, in which he was given Famotidine BID. He states that this is not effective, and he continues to have abdominal pain diarrhea after every meal, and nausea. He states that he believes that he has been having fevers, night sweats and chills. No reports of any other GI problems such as vomiting, and constipation. He has no reports of blood in stools, dysuria and hematuria.  He denies fatigue, recent infections, and weight loss. He has not had any visual changes, and falls. No chest pain, heart palpitations, cough and shortness of breath reported.  No depression or anxiety, and denies suicidal ideations, homicidal ideations, or auditory hallucinations. He denies pain today.   Review of Systems  Constitutional: Positive for chills, diaphoresis, fatigue and fever.  HENT: Negative.   Eyes: Negative.   Respiratory: Positive for cough (occasional).   Cardiovascular: Negative.   Gastrointestinal: Positive for abdominal distention, abdominal pain, diarrhea (frequently) and nausea.  Endocrine: Negative.   Genitourinary: Negative.   Musculoskeletal: Negative.   Skin: Negative.   Allergic/Immunologic: Negative.   Neurological: Positive for dizziness and headaches.  Hematological: Negative.   Psychiatric/Behavioral: Negative.    Objective:   Physical Exam Vitals signs and nursing note reviewed.  Constitutional:      Appearance: He is obese.  HENT:     Head: Normocephalic and atraumatic.     Right Ear: External ear normal.     Nose: Nose normal.   Mouth/Throat:     Mouth: Mucous membranes are moist.     Pharynx: Oropharynx is clear.  Eyes:     Conjunctiva/sclera: Conjunctivae normal.  Neck:     Musculoskeletal: Normal range of motion and neck supple.  Cardiovascular:     Rate and Rhythm: Normal rate and regular rhythm.     Pulses: Normal pulses.     Heart sounds: Normal heart sounds.  Pulmonary:     Effort: Pulmonary effort is normal.     Breath sounds: Normal breath sounds.  Abdominal:     General: Bowel sounds are normal.     Palpations: Abdomen is soft.  Musculoskeletal: Normal range of motion.  Skin:    General: Skin is warm and dry.     Capillary Refill: Capillary refill takes less than 2 seconds.  Neurological:     General: No focal deficit present.     Mental Status: He is alert and oriented to person, place, and time.  Psychiatric:        Mood and Affect: Mood normal.        Thought Content: Thought content normal.        Judgment: Judgment normal.    Assessment & Plan:   1. Gastritis without bleeding, unspecified chronicity, unspecified gastritis type He will continue soft diet and drink plenty of fluids. He will continue Famotidine as prescribed.   2. Nausea We will initiate Zofran today for nausea.   - ondansetron (ZOFRAN) 4 MG tablet; Take 1 tablet (4 mg total) by mouth every 8 (eight) hours as needed for  nausea or vomiting.  Dispense: 20 tablet; Refill: 2  3. Pre-diabetes Improved. Hgb A1c is decreased at 5.8 today, from 7.2 on 07/29/2017. He will continue antidiabetic medications as prescribed.  She will continue to decrease foods/beverages high in sugars and carbs and follow Heart Healthy or DASH diet. Increase physical activity to at least 30 minutes cardio exercise daily.  - POCT glycosylated hemoglobin (Hb A1C) - POCT urinalysis dipstick - POCT glucose (manual entry)  4. Follow up He will follow up in 6 months.   Meds ordered this encounter  Medications  . DISCONTD: ondansetron (ZOFRAN) 4 MG  tablet    Sig: Take 1 tablet (4 mg total) by mouth every 8 (eight) hours as needed for nausea or vomiting.    Dispense:  20 tablet    Refill:  2  . DISCONTD: ondansetron (ZOFRAN) 4 MG tablet    Sig: Take 1 tablet (4 mg total) by mouth every 8 (eight) hours as needed for nausea or vomiting.    Dispense:  20 tablet    Refill:  2  . ondansetron (ZOFRAN) 4 MG tablet    Sig: Take 1 tablet (4 mg total) by mouth every 8 (eight) hours as needed for nausea or vomiting.    Dispense:  20 tablet    Refill:  2   Raliegh Ip,  MSN, FNP-C [facility] [address] [place], [place] [address] [phone]

## 2018-05-23 NOTE — Patient Instructions (Addendum)
Increase liquids, to include Ginger Ale       Dehydration, Adult  Dehydration is when there is not enough fluid or water in your body. This happens when you lose more fluids than you take in. Dehydration can range from mild to very bad. It should be treated right away to keep it from getting very bad. Symptoms of mild dehydration may include:  Thirst.  Dry lips.  Slightly dry mouth.  Dry, warm skin.  Dizziness. Symptoms of moderate dehydration may include:  Very dry mouth.  Muscle cramps.  Dark pee (urine). Pee may be the color of tea.  Your body making less pee.  Your eyes making fewer tears.  Heartbeat that is uneven or faster than normal (palpitations).  Headache.  Light-headedness, especially when you stand up from sitting.  Fainting (syncope). Symptoms of very bad dehydration may include:  Changes in skin, such as: ? Cold and clammy skin. ? Blotchy (mottled) or pale skin. ? Skin that does not quickly return to normal after being lightly pinched and let go (poor skin turgor).  Changes in body fluids, such as: ? Feeling very thirsty. ? Your eyes making fewer tears. ? Not sweating when body temperature is high, such as in hot weather. ? Your body making very little pee.  Changes in vital signs, such as: ? Weak pulse. ? Pulse that is more than 100 beats a minute when you are sitting still. ? Fast breathing. ? Low blood pressure.  Other changes, such as: ? Sunken eyes. ? Cold hands and feet. ? Confusion. ? Lack of energy (lethargy). ? Trouble waking up from sleep. ? Short-term weight loss. ? Unconsciousness. Follow these instructions at home:   If told by your doctor, drink an ORS: ? Make an ORS by using instructions on the package. ? Start by drinking small amounts, about  cup (120 mL) every 5-10 minutes. ? Slowly drink more until you have had the amount that your doctor said to have.  Drink enough clear fluid to keep your pee clear or  pale yellow. If you were told to drink an ORS, finish the ORS first, then start slowly drinking clear fluids. Drink fluids such as: ? Water. Do not drink only water by itself. Doing that can make the salt (sodium) level in your body get too low (hyponatremia). ? Ice chips. ? Fruit juice that you have added water to (diluted). ? Low-calorie sports drinks.  Avoid: ? Alcohol. ? Drinks that have a lot of sugar. These include high-calorie sports drinks, fruit juice that does not have water added, and soda. ? Caffeine. ? Foods that are greasy or have a lot of fat or sugar.  Take over-the-counter and prescription medicines only as told by your doctor.  Do not take salt tablets. Doing that can make the salt level in your body get too high (hypernatremia).  Eat foods that have minerals (electrolytes). Examples include bananas, oranges, potatoes, tomatoes, and spinach.  Keep all follow-up visits as told by your doctor. This is important. Contact a doctor if:  You have belly (abdominal) pain that: ? Gets worse. ? Stays in one area (localizes).  You have a rash.  You have a stiff neck.  You get angry or annoyed more easily than normal (irritability).  You are more sleepy than normal.  You have a harder time waking up than normal.  You feel: ? Weak. ? Dizzy. ? Very thirsty.  You have peed (urinated) only a small amount of very  dark pee during 6-8 hours. Get help right away if:  You have symptoms of very bad dehydration.  You cannot drink fluids without throwing up (vomiting).  Your symptoms get worse with treatment.  You have a fever.  You have a very bad headache.  You are throwing up or having watery poop (diarrhea) and it: ? Gets worse. ? Does not go away.  You have blood or something green (bile) in your throw-up.  You have blood in your poop (stool). This may cause poop to look black and tarry.  You have not peed in 6-8 hours.  You pass out (faint).  Your  heart rate when you are sitting still is more than 100 beats a minute.  You have trouble breathing. This information is not intended to replace advice given to you by your health care provider. Make sure you discuss any questions you have with your health care provider. Document Released: 03/03/2009 Document Revised: 11/25/2015 Document Reviewed: 07/01/2015 Elsevier Interactive Patient Education  2019 [employer] Nausea, Adult Nausea is feeling sick to your stomach or feeling that you are about to throw up (vomit). Feeling sick to your stomach is usually not serious, but it may be an early sign of a more serious medical problem. As you feel sicker to your stomach, you may throw up. If you throw up, or if you are not able to drink enough fluids, there is a risk that you may lose too much water in your body (get dehydrated). If you lose too much water in your body, you may:  Feel tired.  Feel thirsty.  Have a dry mouth.  Have cracked lips.  Go pee (urinate) less often. Older adults and people who have other diseases or a weak body defense system (immune system) have a higher risk of losing too much water in the body. The main goals of treating this condition are:  To relieve your nausea.  To ensure your nausea occurs less often.  To prevent throwing up and losing too much fluid. Follow these instructions at home: Watch your symptoms for any changes. Tell your doctor about them. Follow these instructions as told by your doctor. Eating and drinking      Take an ORS (oral rehydration solution). This is a drink that is sold at pharmacies and stores.  Drink clear fluids in small amounts as you are able. These include: ? Water. ? Ice chips. ? Fruit juice that has water added (diluted fruit juice). ? Low-calorie sports drinks.  Eat bland, easy-to-digest foods in small amounts as you are able, such as: ? Bananas. ? Applesauce. ? Rice. ? Low-fat (lean)  meats. ? Toast. ? Crackers.  Avoid drinking fluids that have a lot of sugar or caffeine in them. This includes energy drinks, sports drinks, and soda.  Avoid alcohol.  Avoid spicy or fatty foods. General instructions  Take over-the-counter and prescription medicines only as told by your doctor.  Rest at home while you get better.  Drink enough fluid to keep your pee (urine) pale yellow.  Take slow and deep breaths when you feel sick to your stomach.  Avoid food or things that have strong smells.  Wash your hands often with soap and water. If you cannot use soap and water, use hand sanitizer.  Make sure that all people in your home wash their hands well and often.  Keep all follow-up visits as told by your doctor. This is important. Contact a doctor if:  You feel sicker to  your stomach.  You feel sick to your stomach for more than 2 days.  You throw up.  You are not able to drink fluids without throwing up.  You have new symptoms.  You have a fever.  You have a headache.  You have muscle cramps.  You have a rash.  You have pain while peeing.  You feel light-headed or dizzy. Get help right away if:  You have pain in your chest, neck, arm, or jaw.  You feel very weak or you pass out (faint).  You have throw up that is bright red or looks like coffee grounds.  You have bloody or black poop (stools) or poop that looks like tar.  You have a very bad headache, a stiff neck, or both.  You have very bad pain, cramping, or bloating in your belly (abdomen).  You have trouble breathing or you are breathing very quickly.  Your heart is beating very quickly.  Your skin feels cold and clammy.  You feel confused.  You have signs of losing too much water in your body, such as: ? Dark pee, very little pee, or no pee. ? Cracked lips. ? Dry mouth. ? Sunken eyes. ? Sleepiness. ? Weakness. These symptoms may be an emergency. Do not wait to see if the symptoms  will go away. Get medical help right away. Call your local emergency services (911 in the [place] Do not drive yourself to the hospital. Summary  Nausea is feeling sick to your stomach or feeling that you are about to throw up (vomit).  If you throw up, or if you are not able to drink enough fluids, there is a risk that you may lose too much water in your body (get dehydrated).  Eat and drink what your doctor tells you. Take over-the-counter and prescription medicines only as told by your doctor.  Contact a doctor right away if your symptoms get worse or you have new symptoms.  Keep all follow-up visits as told by your doctor. This is important. This information is not intended to replace advice given to you by your health care provider. Make sure you discuss any questions you have with your health care provider. Document Released: 04/26/2011 Document Revised: 10/15/2017 Document Reviewed: 10/15/2017 Elsevier Interactive Patient Education  2019 Elsevier Inc. Ondansetron tablets What is this medicine? ONDANSETRON (on DAN se tron) is used to treat nausea and vomiting caused by chemotherapy. It is also used to prevent or treat nausea and vomiting after surgery. This medicine may be used for other purposes; ask your health care provider or pharmacist if you have questions. COMMON BRAND NAME(S): Zofran What should I tell my health care provider before I take this medicine? They need to know if you have any of these conditions: -heart disease -history of irregular heartbeat -liver disease -low levels of magnesium or potassium in the blood -an unusual or allergic reaction to ondansetron, granisetron, other medicines, foods, dyes, or preservatives -pregnant or trying to get pregnant -breast-feeding How should I use this medicine? Take this medicine by mouth with a glass of water. Follow the directions on your prescription label. Take your doses at regular intervals. Do not take your medicine  more often than directed. Talk to your pediatrician regarding the use of this medicine in children. Special care may be needed. Overdosage: If you think you have taken too much of this medicine contact a poison control center or emergency room at once. NOTE: This medicine is only for you. Do not  share this medicine with others. What if I miss a dose? If you miss a dose, take it as soon as you can. If it is almost time for your next dose, take only that dose. Do not take double or extra doses. What may interact with this medicine? Do not take this medicine with any of the following medications: -apomorphine -certain medicines for fungal infections like fluconazole, itraconazole, ketoconazole, posaconazole, voriconazole -cisapride -dofetilide -dronedarone -pimozide -thioridazine -ziprasidone This medicine may also interact with the following medications: -carbamazepine -certain medicines for depression, anxiety, or psychotic disturbances -fentanyl -linezolid -MAOIs like Carbex, Eldepryl, Marplan, Nardil, and Parnate -methylene blue (injected into a vein) -other medicines that prolong the QT interval (cause an abnormal heart rhythm) -phenytoin -rifampicin -tramadol This list may not describe all possible interactions. Give your health care provider a list of all the medicines, herbs, non-prescription drugs, or dietary supplements you use. Also tell them if you smoke, drink alcohol, or use illegal drugs. Some items may interact with your medicine. What should I watch for while using this medicine? Check with your doctor or health care professional right away if you have any sign of an allergic reaction. What side effects may I notice from receiving this medicine? Side effects that you should report to your doctor or health care professional as soon as possible: -allergic reactions like skin rash, itching or hives, swelling of the face, lips or tongue -breathing  problems -confusion -dizziness -fast or irregular heartbeat -feeling faint or lightheaded, falls -fever and chills -loss of balance or coordination -seizures -sweating -swelling of the hands or feet -tightness in the chest -tremors -unusually weak or tired Side effects that usually do not require medical attention (report to your doctor or health care professional if they continue or are bothersome): -constipation or diarrhea -headache This list may not describe all possible side effects. Call your doctor for medical advice about side effects. You may report side effects to FDA at 1-800-FDA-1088. Where should I keep my medicine? Keep out of the reach of children. Store between 2 and 30 degrees C (36 and 86 degrees F). Throw away any unused medicine after the expiration date. NOTE: This sheet is a summary. It may not cover all possible information. If you have questions about this medicine, talk to your doctor, pharmacist, or health care provider.  2019 Elsevier/Gold Standard (2013-02-11 16:27:45)

## 2018-06-05 ENCOUNTER — Ambulatory Visit
Admission: EM | Admit: 2018-06-05 | Discharge: 2018-06-05 | Disposition: A | Discharge status: Home / Self Care | Payer: Self-pay | Attending: Physician Assistant | Admitting: Physician Assistant

## 2018-06-05 DIAGNOSIS — M542 Cervicalgia: Secondary | ICD-10-CM | POA: Insufficient documentation

## 2018-06-05 MED ORDER — CYCLOBENZAPRINE HCL 10 MG PO TABS: 10.0000 mg | ORAL_TABLET | Freq: Two times a day (BID) | ORAL | 0 refills | Status: DC | PRN

## 2018-06-05 NOTE — ED Triage Notes (Signed)
Pt c/o neck pain that woke him up from sleep yesterday morning and this morning. States now tingling going up back of his head. Denies vision disturbance or dizziness. Denies injury.

## 2018-06-05 NOTE — ED Provider Notes (Signed)
[facility]    CSN: [id number] Arrival date & time: 06/05/18  1011     History   Chief Complaint Chief Complaint  Patient presents with  . Neck Pain    HPI Derek Cruz is a 53 y.o. male.   The history is provided by the patient. No language interpreter was used.  Neck Pain  Pain location:  Occipital region Quality:  Aching Pain severity:  Moderate Onset quality:  Gradual Duration:  2 days Timing:  Constant Progression:  Worsening Chronicity:  New Relieved by:  Nothing Worsened by:  Nothing Ineffective treatments:  None tried Associated symptoms: no numbness and no weakness     Past Medical History:  Diagnosis Date  . Chest pain   . Dyslipidemia   . Gastritis     Patient Active Problem List   Diagnosis Date Noted  . Dyslipidemia 10/06/2016  . Chest pain 09/26/2016  . Diabetes mellitus type 2, controlled ([place]) 09/26/2016    History reviewed. No pertinent surgical history.     Home Medications    Prior to Admission medications   Medication Sig Start Date End Date Taking? Authorizing Provider  aspirin 81 MG tablet Take 1 tablet (81 mg total) by mouth daily. 01/31/17   Scot Jun, FNP  blood glucose meter kit and supplies KIT Dispense based on patient and insurance preference. Use up to four times daily as directed. (FOR ICD-9 250.00, 250.01). 09/26/16   Geradine Girt, DO  famotidine (PEPCID) 20 MG tablet Take 1 tablet (20 mg total) by mouth 2 (two) times daily. 05/20/18   Hedges, Dellis Filbert, PA-C  lisinopril (PRINIVIL,ZESTRIL) 5 MG tablet Take 1 tablet (5 mg total) by mouth daily. 07/29/17   Scot Jun, FNP  metFORMIN (GLUCOPHAGE) 1000 MG tablet Take 1 tablet with meals twice daily. 07/29/17   Scot Jun, FNP  ondansetron (ZOFRAN) 4 MG tablet Take 1 tablet (4 mg total) by mouth every 8 (eight) hours as needed for nausea or vomiting. 05/23/18   Azzie Glatter, FNP  pravastatin (PRAVACHOL) 40 MG tablet Take 1  tablet (40 mg total) by mouth daily. 07/29/17   Scot Jun, FNP    Family History Family History  Problem Relation Age of Onset  . Diabetes Mellitus II Neg Hx     Social History Social History   Tobacco Use  . Smoking status: Former Research scientist (life sciences)  . Smokeless tobacco: Never Used  Substance Use Topics  . Alcohol use: Yes  . Drug use: No     Allergies   Patient has no known allergies.   Review of Systems Review of Systems  Musculoskeletal: Positive for neck pain.  Neurological: Negative for weakness and numbness.  All other systems reviewed and are negative.    Physical Exam Triage Vital Signs ED Triage Vitals  Enc Vitals Group     BP 06/05/18 1030 134/90     Pulse Rate 06/05/18 1030 88     Resp 06/05/18 1030 18     Temp 06/05/18 1030 98.2 F (36.8 C)     Temp Source 06/05/18 1030 Oral     SpO2 06/05/18 1030 97 %     Weight --      Height --      Head Circumference --      Peak Flow --      Pain Score 06/05/18 1031 0     Pain Loc --      Pain Edu? --  Excl. in GC? --    No data found.  Updated Vital Signs BP 134/90 (BP Location: Left Arm)   Pulse 88   Temp 98.2 F (36.8 C) (Oral)   Resp 18   SpO2 97%   Visual Acuity Right Eye Distance:   Left Eye Distance:   Bilateral Distance:    Right Eye Near:   Left Eye Near:    Bilateral Near:     Physical Exam Vitals signs reviewed.  HENT:     Head: Normocephalic.     Nose: Nose normal.  Neck:     Musculoskeletal: Normal range of motion. Muscular tenderness present.     Comments: Tender posterior neck diffusely  From  nv and ns intact     Cardiovascular:     Rate and Rhythm: Normal rate.  Pulmonary:     Effort: Pulmonary effort is normal.  Musculoskeletal: Normal range of motion.  Skin:    General: Skin is warm.  Neurological:     General: No focal deficit present.     Mental Status: He is alert.  Psychiatric:        Mood and Affect: Mood normal.      UC Treatments / Results    Labs (all labs ordered are listed, but only abnormal results are displayed) Labs Reviewed - No data to display  EKG None  Radiology No results found.  Procedures Procedures (including critical care time)  Medications Ordered in UC Medications - No data to display  Initial Impression / Assessment and Plan / UC Course  I have reviewed the triage vital signs and the nursing notes.  Pertinent labs & imaging results that were available during my care of the patient were reviewed by me and considered in my medical decision making (see chart for details).     Pt advised he can take tylenol.  Rx for flexeril.   Final Clinical Impressions(s) / UC Diagnoses   Final diagnoses:  Neck pain     Discharge Instructions     Return if any problems,     ED Prescriptions    Medication Sig Dispense Auth. Provider   cyclobenzaprine (FLEXERIL) 10 MG tablet Take 1 tablet (10 mg total) by mouth 2 (two) times daily as needed for muscle spasms. 20 tablet Fransico Meadow, [place]     Controlled Substance Prescriptions [place] Controlled Substance Registry consulted? Not Applicable  An After Visit Summary was printed and given to the patient.    Fransico Meadow, [place] 06/05/18 1044

## 2018-06-05 NOTE — Discharge Instructions (Signed)
Return if any problems,

## 2018-06-12 ENCOUNTER — Ambulatory Visit: Payer: Self-pay | Admitting: Family Medicine

## 2018-11-24 ENCOUNTER — Ambulatory Visit (INDEPENDENT_AMBULATORY_CARE_PROVIDER_SITE_OTHER): Payer: Self-pay | Admitting: Family Medicine

## 2018-11-24 ENCOUNTER — Other Ambulatory Visit: Payer: Self-pay

## 2018-11-24 ENCOUNTER — Encounter: Payer: Self-pay | Admitting: Family Medicine

## 2018-11-24 VITALS — BP 148/90 | HR 88 | Temp 98.4°F | Ht 68.0 in | Wt 168.0 lb

## 2018-11-24 DIAGNOSIS — K219 Gastro-esophageal reflux disease without esophagitis: Secondary | ICD-10-CM | POA: Insufficient documentation

## 2018-11-24 DIAGNOSIS — R7303 Prediabetes: Secondary | ICD-10-CM | POA: Insufficient documentation

## 2018-11-24 DIAGNOSIS — E118 Type 2 diabetes mellitus with unspecified complications: Secondary | ICD-10-CM

## 2018-11-24 DIAGNOSIS — Z09 Encounter for follow-up examination after completed treatment for conditions other than malignant neoplasm: Secondary | ICD-10-CM

## 2018-11-24 DIAGNOSIS — R0789 Other chest pain: Secondary | ICD-10-CM | POA: Insufficient documentation

## 2018-11-24 DIAGNOSIS — I10 Essential (primary) hypertension: Secondary | ICD-10-CM | POA: Insufficient documentation

## 2018-11-24 LAB — POCT GLYCOSYLATED HEMOGLOBIN (HGB A1C): Hemoglobin A1C: 6.1 % — AB (ref 4.0–5.6)

## 2018-11-24 LAB — POCT URINALYSIS DIP (MANUAL ENTRY)
Bilirubin, UA: NEGATIVE
Glucose, UA: NEGATIVE mg/dL
Ketones, POC UA: NEGATIVE mg/dL
Leukocytes, UA: NEGATIVE
Nitrite, UA: NEGATIVE
Spec Grav, UA: 1.02 (ref 1.010–1.025)
Urobilinogen, UA: 0.2 E.U./dL
pH, UA: 6 (ref 5.0–8.0)

## 2018-11-24 MED ORDER — OMEPRAZOLE 20 MG PO CPDR: 20.0000 mg | DELAYED_RELEASE_CAPSULE | Freq: Every day | ORAL | 3 refills | Status: DC

## 2018-11-24 MED ORDER — LISINOPRIL 5 MG PO TABS: 5.0000 mg | ORAL_TABLET | Freq: Every day | ORAL | 3 refills | Status: DC

## 2018-11-24 MED ORDER — METFORMIN HCL 1000 MG PO TABS: ORAL_TABLET | ORAL | 3 refills | Status: DC

## 2018-11-24 MED FILL — LISINOPRIL 5 MG TAB: 5 | 30 days supply | Qty: 30 | Fill #0

## 2018-11-24 MED FILL — metFORMIN HCL 1000 MG TABS: 1000 | 30 days supply | Qty: 60 | Fill #0

## 2018-11-24 MED FILL — OMEPRAZOLE 20 MG CAP: 20 | 30 days supply | Qty: 30 | Fill #0

## 2018-11-24 NOTE — Progress Notes (Signed)
Patient [facility]    Established Patient Office Visit  Subjective:  Patient ID: Derek Cruz, male    DOB: 10/25/1965  Age: 53 y.o. MRN: [id number]  CC:  Chief Complaint  Patient presents with  . Follow-up  . Headache  . Abdominal Pain  . Gastroesophageal Reflux  . Chest Pain    HPI Teodor Faustino Luecke is a 53 year old male who presents for follow up today.   Past Medical History:  Diagnosis Date  . Chest pain   . Dyslipidemia   . Gastritis   . Hypertension    Current Status: Since his last office visit, he is doing well with no complaints.   He has not taken any prescribed medications X 3-4 months ago. He states that he has had dizziness and insomnia lately. His blood pressures are elevated. He admits that he has chest pain and is experiencing mild chest discomfort today  He denies visual changes, chest pain, cough, shortness of breath, heart palpitations, and falls. He has occasional headaches and dizziness with position changes. Denies severe headaches, confusion, seizures, double vision, and blurred vision, nausea and vomiting.  He denies fevers, chills, fatigue, recent infections, weight loss, and night sweats. No reports of GI problems such as diarrhea, and constipation. He has no reports of blood in stools, dysuria and hematuria. No depression or anxiety reported. He denies pain today.   No past surgical history on file.  Family History  Problem Relation Age of Onset  . Diabetes Mellitus II Neg Hx     Social History   Socioeconomic History  . Marital status: Married    Spouse name: Not on file  . Number of children: Not on file  . Years of education: Not on file  . Highest education level: Not on file  Occupational History  . Not on file  Social Needs  . Financial resource strain: Not on file  . Food insecurity    Worry: Not on file    Inability: Not on file  . Transportation needs   Medical: Not on file    Non-medical: Not on file  Tobacco Use  . Smoking status: Former Research scientist (life sciences)  . Smokeless tobacco: Never Used  Substance and Sexual Activity  . Alcohol use: Yes  . Drug use: No  . Sexual activity: Not on file  Lifestyle  . Physical activity    Days per week: Not on file    Minutes per session: Not on file  . Stress: Not on file  Relationships  . Social Herbalist on phone: Not on file    Gets together: Not on file    Attends religious service: Not on file    Active member of club or organization: Not on file    Attends meetings of clubs or organizations: Not on file    Relationship status: Not on file  . Intimate partner violence    Fear of current or ex partner: Not on file    Emotionally abused: Not on file    Physically abused: Not on file    Forced sexual activity: Not on file  Other Topics Concern  . Not on file  Social History Narrative  . Not on file    Outpatient Medications Prior to Visit  Medication Sig Dispense Refill  . aspirin 81 MG tablet Take 1 tablet (81 mg total) by mouth daily. (Patient not taking: Reported on 11/24/2018) 30 tablet 5  .  blood glucose meter kit and supplies KIT Dispense based on patient and insurance preference. Use up to four times daily as directed. (FOR ICD-9 250.00, 250.01). (Patient not taking: Reported on 11/24/2018) 1 each 0  . cyclobenzaprine (FLEXERIL) 10 MG tablet Take 1 tablet (10 mg total) by mouth 2 (two) times daily as needed for muscle spasms. (Patient not taking: Reported on 11/24/2018) 20 tablet 0  . ondansetron (ZOFRAN) 4 MG tablet Take 1 tablet (4 mg total) by mouth every 8 (eight) hours as needed for nausea or vomiting. (Patient not taking: Reported on 11/24/2018) 20 tablet 2  . pravastatin (PRAVACHOL) 40 MG tablet Take 1 tablet (40 mg total) by mouth daily. (Patient not taking: Reported on 11/24/2018) 90 tablet 3  . famotidine (PEPCID) 20 MG tablet Take 1 tablet (20 mg total) by mouth 2 (two) times daily.  (Patient not taking: Reported on 11/24/2018) 30 tablet 0  . lisinopril (PRINIVIL,ZESTRIL) 5 MG tablet Take 1 tablet (5 mg total) by mouth daily. (Patient not taking: Reported on 11/24/2018) 90 tablet 3  . metFORMIN (GLUCOPHAGE) 1000 MG tablet Take 1 tablet with meals twice daily. (Patient not taking: Reported on 11/24/2018) 180 tablet 1   No facility-administered medications prior to visit.     No Known Allergies  ROS Review of Systems  Constitutional: Negative.   HENT: Negative.   Eyes: Negative.   Respiratory: Negative.   Cardiovascular: Negative.   Gastrointestinal: Negative.   Endocrine: Negative.   Genitourinary: Negative.   Musculoskeletal: Negative.   Skin: Negative.   Allergic/Immunologic: Negative.   Neurological: Negative.   Hematological: Negative.   Psychiatric/Behavioral: Negative.     Objective:    Physical Exam  Constitutional: He is oriented to person, place, and time. He appears well-developed and well-nourished.  HENT:  Head: Normocephalic and atraumatic.  Eyes: Conjunctivae are normal.  Neck: Normal range of motion. Neck supple.  Cardiovascular: Normal rate, regular rhythm, normal heart sounds and intact distal pulses.  Pulmonary/Chest: Effort normal and breath sounds normal.  Abdominal: Soft. Bowel sounds are normal.  Musculoskeletal: Normal range of motion.  Neurological: He is alert and oriented to person, place, and time. He has normal reflexes.  Skin: Skin is warm and dry.  Psychiatric: He has a normal mood and affect. His behavior is normal. Judgment and thought content normal.  Nursing note and vitals reviewed.   BP (!) 148/90   Pulse 88   Temp 98.4 F (36.9 C) (Oral)   Ht _0  (1.727 m)   Wt 168 lb (76.2 kg)   SpO2 100%   BMI 25.54 kg/m  Wt Readings from Last 3 Encounters:  11/24/18 168 lb (76.2 kg)  05/23/18 169 lb (76.7 kg)  07/29/17 188 lb (85.3 kg)    Health Maintenance Due  Topic Date Due  . PNEUMOCOCCAL POLYSACCHARIDE VACCINE  AGE 64-64 HIGH RISK  09/27/1967  . FOOT EXAM  09/27/1975  . OPHTHALMOLOGY EXAM  09/27/1975  . COLONOSCOPY  09/27/2015  . HEMOGLOBIN A1C  11/21/2018    There are no preventive care reminders to display for this patient.  Lab Results  Component Value Date   TSH 2.100 08/09/2017   Lab Results  Component Value Date   WBC 10.9 (H) 05/20/2018   HGB 17.0 05/20/2018   HCT 48.6 05/20/2018   MCV 92.0 05/20/2018   PLT 277 05/20/2018   Lab Results  Component Value Date   NA 137 05/20/2018   K 3.7 05/20/2018   CO2 22 05/20/2018  GLUCOSE 105 (H) 05/20/2018   BUN 9 05/20/2018   CREATININE 0.72 05/20/2018   BILITOT 1.1 05/20/2018   ALKPHOS 73 05/20/2018   AST 32 05/20/2018   ALT 26 05/20/2018   PROT 7.8 05/20/2018   ALBUMIN 4.4 05/20/2018   CALCIUM 9.1 05/20/2018   ANIONGAP 13 05/20/2018   Lab Results  Component Value Date   CHOL 169 08/09/2017   Lab Results  Component Value Date   HDL 37 (L) 08/09/2017   Lab Results  Component Value Date   LDLCALC 92 08/09/2017   Lab Results  Component Value Date   TRIG 198 (H) 08/09/2017   Lab Results  Component Value Date   CHOLHDL 4.6 08/09/2017   Lab Results  Component Value Date   HGBA1C 6.1 (A) 11/24/2018   Assessment & Plan:   1. Pre-diabetes Hgb A1c is stable at 6.1 today, from 5.8 on 05/2018.  She will continue to decrease foods/beverages high in sugars and carbs and follow Heart Healthy or DASH diet. Increase physical activity to at least 30 minutes cardio exercise daily.  - POCT glycosylated hemoglobin (Hb A1C) - POCT urinalysis dipstick - metFORMIN (GLUCOPHAGE) 1000 MG tablet; Take 1 tablet with meals twice daily.  Dispense: 60 tablet; Refill: 3 - CBC with Differential - Comprehensive metabolic panel - Lipid Panel - PSA - Vitamin D, 25-hydroxy - Vitamin B12  2. Gastroesophageal reflux disease without esophagitis - omeprazole (PRILOSEC) 20 MG capsule; Take 1 capsule (20 mg total) by mouth daily.  Dispense: 30  capsule; Refill: 3  3. Chest discomfort EKG is stable with Normal Sinus Rhythm today. Stabilized today. He denies visual changes, chest pain, cough, shortness of breath, heart palpitations, and falls. He has occasional headaches and dizziness with position changes. Denies severe headaches, He will report to ED if he eperiences severe headaches, confusion, seizures, double vision, and blurred vision, nausea and vomiting, seizures, double vision, and blurred vision, nausea and vomiting.  4. Essential hypertension The current medical regimen is effective; blood pressure  is stable at 148/90 today; continue present plan and medications as prescribed. She will continue to decrease high sodium intake, excessive alcohol intake, increase potassium intake, smoking cessation, and increase physical activity of at least 30 minutes of cardio activity daily. She will continue to follow Heart Healthy or DASH diet. - lisinopril (ZESTRIL) 5 MG tablet; Take 1 tablet (5 mg total) by mouth daily.  Dispense: 30 tablet; Refill: 3 - CBC with Differential - Comprehensive metabolic panel - Lipid Panel - PSA - Vitamin D, 25-hydroxy - Vitamin B12  5. Type 2 diabetes mellitus with complication, without long-term current use of insulin ([place])  6. Follow up He will follow up in 1 month.   Meds ordered this encounter  Medications  . omeprazole (PRILOSEC) 20 MG capsule    Sig: Take 1 capsule (20 mg total) by mouth daily.    Dispense:  30 capsule    Refill:  3  . lisinopril (ZESTRIL) 5 MG tablet    Sig: Take 1 tablet (5 mg total) by mouth daily.    Dispense:  30 tablet    Refill:  3  . metFORMIN (GLUCOPHAGE) 1000 MG tablet    Sig: Take 1 tablet with meals twice daily.    Dispense:  60 tablet    Refill:  3    Orders Placed This Encounter  Procedures  . CBC with Differential  . Comprehensive metabolic panel  . Lipid Panel  . PSA  . Vitamin D, 25-hydroxy  .  Vitamin B12  . POCT glycosylated hemoglobin (Hb A1C)   . POCT urinalysis dipstick    Referral Orders  No referral(s) requested today    Kathe Becton,  MSN, FNP-BC Patient [facility] [place], [place] [phone]   Problem List Items Addressed This Visit    None    Visit Diagnoses    Pre-diabetes    -  Primary   Relevant Medications   metFORMIN (GLUCOPHAGE) 1000 MG tablet   Other Relevant Orders   POCT glycosylated hemoglobin (Hb A1C) (Completed)   POCT urinalysis dipstick (Completed)   CBC with Differential   Comprehensive metabolic panel   Lipid Panel   PSA   Vitamin D, 25-hydroxy   Vitamin B12   Gastroesophageal reflux disease without esophagitis       Relevant Medications   omeprazole (PRILOSEC) 20 MG capsule   Chest discomfort       Essential hypertension       Relevant Medications   lisinopril (ZESTRIL) 5 MG tablet   Other Relevant Orders   CBC with Differential   Comprehensive metabolic panel   Lipid Panel   PSA   Vitamin D, 25-hydroxy   Vitamin B12   Type 2 diabetes mellitus with complication, without long-term current use of insulin ([facility])       Relevant Medications   lisinopril (ZESTRIL) 5 MG tablet   metFORMIN (GLUCOPHAGE) 1000 MG tablet   Follow up          Meds ordered this encounter  Medications  . omeprazole (PRILOSEC) 20 MG capsule    Sig: Take 1 capsule (20 mg total) by mouth daily.    Dispense:  30 capsule    Refill:  3  . lisinopril (ZESTRIL) 5 MG tablet    Sig: Take 1 tablet (5 mg total) by mouth daily.    Dispense:  30 tablet    Refill:  3  . metFORMIN (GLUCOPHAGE) 1000 MG tablet    Sig: Take 1 tablet with meals twice daily.    Dispense:  60 tablet    Refill:  3    Follow-up: Return in about 1 month (around 12/25/2018).    Azzie Glatter, FNP

## 2018-11-24 NOTE — Patient Instructions (Addendum)
Lisinopril tablets Qu es este medicamento? El LISINOPRIL es un inhibidor de la ECA. Este medicamento se utiliza para el tratamiento de la alta presin sangunea o insuficiencia cardiaca. Tambin se [place] para proteger el corazn inmediatamente despus de un ataque cardaco. Este medicamento puede ser utilizado para otros usos; si tiene alguna pregunta consulte con su proveedor de atencin mdica o con su farmacutico. MARCAS COMUNES: Prinivil, Zestril Qu le debo informar a mi profesional de la salud antes de tomar este medicamento? Necesitan saber si usted presenta alguno de los [employer] o situaciones: diabetes enfermedad vascular o cardiaca enfermedad renal baja presin sangunea inflamacin previa de la lengua, el rostro o los labios con dificultad para Ambulance person, dificultad para tragar, ronquera o estrechamiento de la garganta una reaccin alrgica o inusual al lisinopril, a otros inhibidores de la ECA, al veneno de insectos, a alimentos, colorantes o conservantes si est embarazada o buscando quedar embarazada si est amamantando a un beb Cmo debo utilizar este medicamento? Tome este medicamento por va oral con un vaso de agua. Siga las instrucciones de la etiqueta del [place]. Este medicamento se puede tomar con o sin alimentos. Si el Transport planner, tmelo con alimentos. Tome su medicamento a intervalos regulares. No lo tome con una frecuencia mayor a la indicada. No deje de tomarlo, excepto si as lo indica su mdico. Hable con su pediatra para informarse acerca del uso de este medicamento en nios. Puede requerir atencin especial. Aunque este medicamento se puede recetar a nios tan pequeos como de 6 aos de edad en casos selectos, existen precauciones que deben tomarse. Sobredosis: Pngase en contacto inmediatamente con un centro toxicolgico o una sala de urgencia si usted cree que haya tomado demasiado medicamento. ATENCIN: [employer] [place] [employer] es solo para usted. No comparta este medicamento con nadie. Qu sucede si me olvido de una dosis? Si olvida una dosis, tmela lo antes posible. Si es casi la hora de la prxima dosis, tome slo esa dosis. No tome dosis adicionales o dobles. Qu puede interactuar con este medicamento? No tome este medicamento con ninguno de los siguientes frmacos: veneno de himenpteros sacubitril; valsartn Este medicamento tambin podra interactuar con las siguientes medicinas: aliskirn bloqueadores del receptor de angiotensina, como losartn o valsartn ciertos medicamentos para la diabetes diurticos everolimus compuestos de oro litio AINE, medicamentos para Conservation officer, historic buildings y la inflamacin, como ibuprofeno o naproxeno suplementos o Press photographer de potasio sustitutos de la sal sirolimus temsirolimus Puede ser que esta lista no menciona todas las posibles interacciones. Informe a su profesional de KB Home	[place] de [employer] Corporation productos a base de hierbas, medicamentos de [place] o suplementos nutritivos que est tomando. Si usted fuma, consume bebidas alcohlicas o si utiliza drogas ilegales, indqueselo tambin a su profesional de KB Home	[place]. Algunas sustancias pueden interactuar con su medicamento. A qu debo estar atento al usar Coca-Cola? Visite a su mdico o a su profesional de la salud para que revise su evolucin peridicamente. Revise su presin sangunea como se le indique. Pregunte a su mdico cul debe ser su presin sangunea y cundo Research scientist (life sciences). No se trate usted mismo si tiene tos, resfro o Nutritional therapist est usando este medicamento sin consultar con su mdico o con su profesional de KB Home	[place]. Algunos ingredientes pueden aumentar su presin sangunea. Las mujeres deben informar a su mdico si estn buscando quedar embarazadas o si creen que podran estar embarazadas. Existe la posibilidad de efectos secundarios graves en un beb sin nacer. Para obtener ms  informacin, hable con su  profesional de la salud o su farmacutico. Consulte con su mdico o su profesional de la salud si tiene un ataque de diarrea grave, nuseas y vmitos, o sudoracin intensa. La prdida de demasiado lquido corporal puede hacer que sea peligroso tomar Coca-Cola. Puede experimentar somnolencia o mareos. No conduzca, no utilice maquinaria ni haga nada que Associate Professor en estado de alerta hasta que sepa cmo le afecta este frmaco. No se siente ni se ponga de pie con rapidez, especialmente si es un paciente de edad avanzada. Esto reduce el riesgo de mareos o [employer]. El alcohol puede aumentar los mareos y la somnolencia. Evite consumir bebidas alcohlicas. Evite consumir sustitutos de la sal, a menos que su mdico o profesional de la salud le indiquen lo contrario. Qu efectos secundarios puedo tener al [employer] este medicamento? Efectos secundarios que debe informar a su mdico o a Barrister's clerk de la salud tan pronto como sea posible: Chief of Staff, como erupcin cutnea, comezn/picazn o urticarias, hinchazn de las manos, los pies, la cara, los labios, la garganta o la lengua problemas respiratorios signos y sntomas de lesin al rin, tales como dificultad para orinar o cambios en la cantidad de orina signos y sntomas de aumento del nivel de Field seismologist, tales como debilidad muscular; dolor en el pecho; o ritmo cardiaco rpido o irregular signos y sntomas de lesin al hgado, como orina amarilla oscura o [place]; sensacin general de estar enfermo o sntomas gripales; heces claras; prdida de apetito; nuseas; dolor en la regin abdominal superior derecha; cansancio o debilidad inusual; color amarillento de los ojos o la piel signos y sntomas de presin sangunea baja, tales como [place], sensacin de Youth worker o aturdimiento, cadas, cansancio o debilidad inusual dolor de Paramedic con o sin nuseas y vmitos Efectos secundarios que generalmente no requieren atencin mdica (infrmelos a  su mdico o a Barrister's clerk de la salud si persisten o si son molestos): cambios en el sentido del gusto tos mareos fiebre Social research officer, government de cabeza sensibilidad a la luz Puede ser que esta lista no menciona todos los posibles efectos secundarios. Comunquese a su mdico por asesoramiento mdico [employer]. Usted puede informar los efectos secundarios a la FDA por telfono al 1-800-FDA-1088. Dnde debo guardar mi medicina? Mantngala fuera del alcance de los nios. Gurdela a [employer] Group, entre 15 y 25 grados C (33 y 75 grados F). Protjala de la humedad. Mantenga el envase bien cerrado. Deseche todo el medicamento que no haya utilizado, despus de la fecha de vencimiento. ATENCIN: Este folleto es un resumen. Puede ser que no cubra toda la posible informacin. Si usted tiene preguntas acerca de esta medicina, consulte con su mdico, su farmacutico o su profesional de Technical sales engineer.  2020 Elsevier/Gold Standard (2016-06-07 00:00:00) Hipertensin en los adultos Hypertension, Adult La presin arterial alta (hipertensin) se produce cuando la fuerza de la sangre bombea a travs de las arterias con mucha fuerza. Las arterias son los vasos sanguneos que transportan la sangre desde el corazn al resto del cuerpo. La hipertensin hace que el corazn haga ms esfuerzo para Chiropodist y [employer] Corporation que las arterias se Teacher, music o Advertising account executive. La hipertensin no tratada o no controlada puede causar infarto de miocardio, insuficiencia cardaca, accidente cerebrovascular, enfermedad renal y otros problemas. Una lectura de la presin arterial consta de un nmero ms alto sobre un nmero ms bajo. En condiciones ideales, la presin arterial debe estar por debajo de 120/80. El Teacher, English as a foreign language (superior) es la  presin sistlica. Es la medida de la presin de las arterias cuando el corazn late. El segundo nmero (inferior) es la presin diastlica. Es la medida de la presin en las arterias  cuando el corazn se relaja. Cules son las causas? Se desconoce la causa exacta de esta afeccin. Hay algunas afecciones que causan presin arterial alta o estn relacionadas con ella. Qu incrementa el riesgo? Algunos factores de riesgo de hipertensin estn bajo su control. Los siguientes factores pueden hacer que sea ms propenso a Armed forces training and education officer afeccin:  Fumar.  Tener diabetes mellitus tipo 2, colesterol alto, o ambos.  No hacer la cantidad suficiente de actividad fsica o ejercicio.  Tener sobrepeso.  Consumir mucha grasa, azcar, caloras o sal (sodio) en su dieta.  Beber alcohol en exceso. Algunos factores de riesgo para la presin arterial alta pueden ser difciles o imposibles de Quarry manager. Algunos de estos factores son los siguientes:  Tener enfermedad renal crnica.  Tener antecedentes familiares de presin arterial alta.  Edad. Los riesgos aumentan con la edad.  Raza. El riesgo es mayor para las Retail banker.  Sexo. Antes de los 45aos, los hombres corren ms [employer]. Despus de los 65aos, las mujeres corren ms [employer].  Tener apnea obstructiva del sueo.  Estrs. Cules son los signos o los sntomas? Es posible que la presin arterial alta puede no cause sntomas. La presin arterial muy alta (crisis hipertensiva) puede provocar:  Dolor de cabeza.  Ansiedad.  Falta de aire.  Hemorragia nasal.  Nuseas y vmitos.  Cambios en la visin.  Dolor de pecho intenso.  Convulsiones. Cmo se diagnostica? Esta afeccin se diagnostica al medir su presin arterial mientras se encuentra sentado, con el brazo apoyado sobre una superficie plana, las piernas sin cruzar y los pies bien apoyados en el piso. El brazalete del tensimetro debe colocarse directamente sobre la piel de la parte superior del brazo y al nivel de su corazn. Debe medirla al [facility] veces en el mismo brazo. Determinadas condiciones pueden causar  una diferencia de presin arterial entre el brazo izquierdo y Insurance underwriter. Ciertos factores pueden provocar que las lecturas de la presin arterial sean inferiores o superiores a lo normal por un perodo corto de tiempo:  Si su presin arterial es ms alta cuando se encuentra en el consultorio del mdico que cuando la mide en su hogar, se denomina hipertensin de bata blanca. La State Farm de las personas que tienen esta afeccin no deben ser [employer].  Si su presin arterial es ms alta en el hogar que cuando se encuentra en el consultorio del mdico, se denomina hipertensin enmascarada. La [employer] de las personas que tienen esta afeccin deben ser medicadas para Chief Technology Officer la presin arterial. Si tiene una lecturas de presin arterial alta durante una visita o si tiene presin arterial normal con otros factores de riesgo, se le podr pedir que haga lo siguiente:  Que regrese otro da para volver a Chief Technology Officer su presin arterial nuevamente.  Que se controle la presin arterial en su casa durante 1 semana o ms. Si se le diagnostica hipertensin, es posible que se le realicen otros anlisis de sangre o estudios de diagnstico por imgenes para ayudar a su mdico a comprender su riesgo general de tener otras afecciones. Cmo se trata? Esta afeccin se trata haciendo cambios saludables en el estilo de vida, tales como ingerir alimentos saludables, realizar ms ejercicio y reducir el consumo de alcohol. El mdico puede recetarle medicamentos si los cambios en  el estilo de vida no son suficientes para Child psychotherapist la presin arterial y si:  Su presin arterial sistlica est por encima de 130.  Su presin arterial diastlica est por encima de 80. La presin arterial deseada puede variar en funcin de las enfermedades, la edad y otros factores personales. Siga estas instrucciones en su casa: Comida y bebida   Siga una dieta con alto contenido de fibras y [place], y con bajo contenido de  sodio, Location manager agregada y Physicist, medical. Un ejemplo de plan alimenticio es la dieta DASH (Dietary Approaches to Stop Hypertension, Mtodos alimenticios para detener la hipertensin). Para alimentarse de esta manera: ? Coma mucha fruta y [place]. Trate de que la mitad del plato de cada comida sea de frutas y verduras. ? Coma cereales integrales, como pasta integral, arroz integral o pan integral. Llene aproximadamente un cuarto del plato con cereales integrales. ? Coma y beba productos lcteos con bajo contenido de grasa, como leche descremada o yogur bajo en grasas. ? Evite la ingesta de cortes de carne grasa, carne procesada o curada, y carne de ave con piel. Llene aproximadamente un cuarto del plato con protenas magras, como pescado, pollo sin piel, frijoles, huevos o tofu. ? Evite ingerir alimentos prehechos y procesados. En general, estos tienen mayor cantidad de sodio, azcar agregada y Wendee Copp.  Reduzca su ingesta diaria de sodio. La mayora de las personas que tienen hipertensin deben comer menos de 1500 mg de sodio por [employer].  No beba alcohol si: ? Su mdico le indica no hacerlo. ? Est embarazada, puede estar embarazada o est tratando de quedar embarazada.  Si bebe alcohol: ? Limite la cantidad que bebe a lo siguiente:  De 0 a 1 medida por da para las mujeres.  De 0 a 2 medidas por da para los hombres. ? Est atento a la cantidad de alcohol que hay en las bebidas que toma. En los [place], una medida equivale a una botella de cerveza de 12oz (332m), un vaso de vino de 5oz (1465m o un vaso de una bebida alcohlica de alta graduacin de 1oz (4449m Estilo de vida   Trabaje con su mdico para mantener un peso saludable o perAdministrator, Civil Serviceregntele cul es el peso recomendado para usted.  Haga al menos 35m84mos de ejercicio la mayoHartford Financialla semaLansdaletas actividades pueden incluir caminar, nadar o andar en bicicleta.  Incluya ejercicios para fortalecer sus  msculos (ejercicios de resistencia), como [place] o levantamiento de pesas, como parte de su rutina semanal de ejercicios. Intente realizar 35mi29ms de este tipo de ejercicios al menosSolectron Corporation semanCollins consuma ningn producto que contenga nicotina o tabaco, como cigarrillos, cigarrillos electrnicos y tabaco de mascaHigher education careers advisernecesita ayuda para dejar de fumar, consulte al mdico.  Contrlese la presin arterial en su casa segn las indicaciones del mdico.  Concurra a todas las visitas de seguimiento como se lo haya indicado el mdico. Esto es importante. Medicamentos  [place] Delphienta libre y los recetados solamente como se lo haya indicado el mdico. Siga cuidadosamente las indicaciones. Los medicamentos para la presin arterial deben tomarse segn las indicaciones.  No omita las dosis de medicamentos para la presin arterial. Si lo hace, estar en riesgo de tener problemas y puede hacer que los medicamentos sean menos eficaces.  Pregntele a su mdico a qu efectos secundarios o reacciones a los medicCareers information officerunquese con un mdico si:  Piensa que tiene unaState Street Corporation  reaccin a un medicamento que est tomando.  Tiene dolores de cabeza frecuentes (recurrentes).  Se siente mareado.  Tiene hinchazn en los tobillos.  Tiene problemas de visin. Solicite ayuda inmediatamente si:  Siente un dolor de cabeza intenso o confusin.  Siente debilidad inusual o adormecimiento.  Siente que va a desmayarse.  Siente un dolor intenso en el pecho o el abdomen.  Vomita repetidas veces.  Tiene dificultad para respirar. Resumen  La hipertensin se produce cuando la sangre bombea en las arterias con mucha fuerza. Si esta afeccin no se controla, podra correr riesgo de tener complicaciones graves.  La presin arterial deseada puede variar en funcin de las enfermedades, la edad y otros factores personales. Para la [employer], una presin  arterial normal es menor que 120/80.  La hipertensin se trata con cambios en el estilo de vida, medicamentos o una combinacin de [place]. Los McDonald[employer] Corporation estilo de vida incluyen prdida de peso, ingerir alimentos sanos, seguir una dieta baja en sodio, hacer ms ejercicio y Environmental consultant consumo de alcohol. Esta informacin no tiene Marine scientist el consejo del mdico. Asegrese de hacerle al mdico cualquier pregunta que tenga. Document Released: 05/07/2005 Document Revised: 02/20/2018 Document Reviewed: 02/20/2018 Elsevier Patient Education  [place]. Omeprazole tablets (OTC) Qu es este medicamento? El OMEPRAZOL previene la produccin de cido en el estmago. Ayuda a tratar los sntomas de la acidez estomacal. [place] medicamento se puede adquirir sin receta. Este producto no debe usarse por perodos prolongados, a menos que su mdico o su profesional de Technical sales engineer. Este medicamento puede ser utilizado para otros usos; si tiene alguna pregunta consulte con su proveedor de atencin mdica o con su farmacutico. MARCAS COMUNES: Prilosec OTC Qu le debo informar a mi profesional de la salud antes de tomar este medicamento? Necesitan saber si usted presenta alguno de los siguientes problemas o situaciones: heces con sangre o de color oscuro dolor en el pecho dificultad para tragar ha tenido Merchant navy officer durante ms de 3 meses tiene Merchant navy officer con Te[employer] Corporation, aturdimiento, o sudoracin enfermedad heptica lupus dolor estomacal prdida de peso inexplicable vmito con sangre sibilancias una reaccin alrgica o inusual al omeprazol, a otros medicamentos, alimentos, colorantes o conservantes si est embarazada o buscando quedar embarazada si est amamantando a un beb Cmo debo utilizar este medicamento? Tome este medicamento por va oral con un vaso de agua. Siga las instrucciones de la etiqueta del producto. No corte, triture ni [employer]. [place] tabletas enteras. Tome  este medicamento con el estmago vaco, al menos 30 minutos antes del desayuno. Use su medicamento a intervalos regulares. No lo use con una frecuencia mayor a la indicada. Hable con su pediatra para informarse acerca del uso de este medicamento en nios. Puede requerir atencin especial. Sobredosis: Pngase en contacto inmediatamente con un centro toxicolgico o una sala de urgencia si usted cree que haya tomado demasiado medicamento. ATENCIN: [employer]ra Foods es solo para usted. No comparta este medicamento con nadie. Qu sucede si me olvido de una dosis? Si olvida una dosis, tmela lo antes posible. Si es casi la hora de la prxima dosis, tome slo esa dosis. No tome dosis adicionales o dobles. Qu puede interactuar con este medicamento? No use este medicamento con ninguno de los siguientes frmacos: atazanavir clopidogrel nelfinavir rilpivirina Este medicamento tambin puede interactuar con los siguientes frmacos: antimicticos, tales como itraconazol, quetoconazol y voriconazol ciertos medicamentos antivirales para VIH o hepatitis ciertos medicamentos que tratan o previenen cogulos  sanguneos, como warfarina cilostazol citalopram ciclosporina dasatinib digoxina disulfirm diurticos erlotinib suplementos de hierro medicamentos para la ansiedad, el pnico y para dormir, como diazepam medicamentos para las convulsiones, tales como [address], [place], fenitona metotrexato mofetil micofenolato nilotinib rifampicina hierba de [place] tacrolims vitamina B12 Puede ser que esta lista no menciona todas las posibles interacciones. Informe a su profesional de KB Home	[place] de AES Corporation productos a base de hierbas, medicamentos de [place] o suplementos nutritivos que est tomando. Si usted fuma, consume bebidas alcohlicas o si utiliza drogas ilegales, indqueselo tambin a su profesional de KB Home	[place]. Algunas sustancias pueden interactuar con su medicamento. A qu debo estar atento al usar [employer] Industries? Es posible que deban pasar varios das para que su acidez estomacal mejore. Informe a su profesional de la salud si sus sntomas no comienzan a mejorar o si empeoran. Si necesita usar Coca-Cola por ms de [address] hable con su profesional de KB Home	[place]. En algunos casos la acidez puede ser causada por una afeccin ms grave. Este medicamento puede causar una disminucin de la vitamina B12. Debe asegurarse de recibir suficiente vitamina B12 mientras toma este medicamento. Converse con su profesional de la salud sobre los alimentos que come y las vitaminas que toma. Qu efectos secundarios puedo tener al [employer] Corporation este medicamento? Efectos secundarios que debe informar a su mdico o a Barrister's clerk de la salud tan pronto como sea posible: Chief of Staff, como erupcin cutnea, comezn/picazn o urticaria, e hinchazn de la cara, los labios o la lengua dolor de huesos problemas para respirar fiebre o dolor de Press photographer en las articulaciones erupcin en las mejillas o los brazos que empeora con el sol enrojecimiento, formacin de ampollas, descamacin o distensin de la piel, incluso dentro de la boca diarrea grave signos y sntomas de lesin al rin, tales como dificultad para orinar o cambios en la cantidad de orina signos y sntomas de bajo nivel de Statistician, tales como calambres musculares; dolor muscular; debilidad muscular; temblores; convulsiones; o ritmo cardiaco rpido, irregular plipos estomacales sangrado o moretones inusuales Efectos secundarios que generalmente no requieren atencin mdica (infrmelos a su mdico o a Barrister's clerk de la salud si persisten o si son molestos): diarrea boca seca gases dolor de cabeza nuseas dolor estomacal Puede ser que esta lista no menciona todos los posibles efectos secundarios. Comunquese a su mdico por asesoramiento mdico Humana Inc. Usted puede informar los efectos secundarios a la FDA por telfono al  1-800-FDA-1088. Dnde debo guardar mi medicina? Mantngala fuera del alcance de los nios. Gurdela a [employer], entre 20 y 13 grados C (73 y 16 grados F). Protjala de la luz y de la humedad. Deseche los medicamentos que no haya utilizado, despus de la fecha de vencimiento. ATENCIN: Este folleto es un resumen. Puede ser que no cubra toda la posible informacin. Si usted tiene preguntas acerca de esta medicina, consulte con su mdico, su farmacutico o su profesional de Technical sales engineer.  2020 Elsevier/Gold Standard (2018-04-14 00:00:00) Gammagrafa de reflujo gastroesofgico Gastroesophageal Reflux Scan La gammagrafa de reflujo gastroesofgico es un procedimiento que verifica el flujo retrgrado de los contenidos del estmago hacia el esfago (reflujo gastroesofgico). El esfago es el conducto que transporta los alimentos desde la boca hasta el [place]. La gammagrafa tambin puede mostrar si algn contenido del estmago se ha inhalado (aspirado) en los pulmones. Puede necesitar esta gammagrafa si tiene sntomas tales como acidez estomacal, vmitos, dificultad para tragar o regurgitacin. La regurgitacin es [employer] Corporation  alimentos ingeridos regresan desde Aeronautical engineer. Para esta exploracin, beber un lquido que contiene una pequea cantidad de una sustancia radiactiva Futures trader). Un escner con Ardelia Mems cmara que detecta el marcador mostrar si parte del material fluye de vuelta hacia el esfago. Informe al mdico acerca de lo siguiente:  Cualquier alergia que tenga.  Todos los [employer], incluidos vitaminas, hierbas, gotas oftlmicas, cremas y medicamentos de venta libre.  Cualquier enfermedad de la sangre que tenga.  Cirugas a las que se someti.  Cualquier afeccin mdica que tenga.  Si est embarazada o podra estarlo.  Si est amamantando. Cules son los riesgos? En general, se trata de un procedimiento seguro. Sin embargo, pueden ocurrir  complicaciones, por ejemplo:  Exposicin a la radiacin (pequea cantidad).  Reaccin alrgica a la sustancia radiactiva. Esto es raro, pero puede incluir hinchazn de la lengua y dificultad para Ambulance person. Qu ocurre antes del procedimiento?  Consulte a su mdico si debe cambiar o suspender los medicamentos que toma habitualmente.  Siga las indicaciones del mdico respecto de las restricciones para las comidas o las bebidas. Qu ocurre durante el procedimiento?   Beber un lquido que contiene una pequea cantidad de un Armed forces technical officer. Este lquido ser probablemente similar a un jugo de [place].  Usted se recostar [place].  Se obtendr una serie de imgenes del esfago y la parte superior del [place].  Se le podr pedir que se mueva en diferentes posiciones para ayudar a determinar si el reflujo ocurre con ms frecuencia cuando est en posiciones especficas.  Para los adultos, se puede colocar una faja abdominal con un manguito inflable en el abdomen. Esto puede utilizarse para aumentar la presin abdominal. Se tomarn ms imgenes para determinar si el aumento de la presin causa el reflujo. Este procedimiento puede variar segn el mdico y el hospital. Qu puedo esperar despus del procedimiento?   Reanude sus actividades normales y su dieta como se lo haya indicado el mdico.  El marcador radioactivo se eliminar del cuerpo luego de [place]. Beba suficiente lquido como para Theatre manager la orina de color amarillo plido. Esto ayudar a Musician del cuerpo.  Es su responsabilidad retirar [employer] del procedimiento. Pregntele al mdico o a alguien del departamento donde se realice el procedimiento cundo y cmo [employer]. Resumen  La gammagrafa de reflujo gastroesofgico es un procedimiento que verifica el flujo retrgrado de los contenidos del estmago hacia el esfago (reflujo gastroesofgico).  Puede necesitar esta  gammagrafa si tiene sntomas tales como acidez estomacal, vmitos, dificultad para tragar o regurgitacin.  Antes del procedimiento, siga las indicaciones del mdico respecto de las restricciones de comidas o bebidas, y si debe tomar sus medicamentos habituales.  Para este procedimiento, beber un lquido que contiene una pequea cantidad de una sustancia radiactiva Futures trader). Un escner con Ardelia Mems cmara que detecta el marcador mostrar si parte del material fluye de vuelta hacia el esfago. Esta informacin no tiene Marine scientist el consejo del mdico. Asegrese de hacerle al mdico cualquier pregunta que tenga. Document Released: 02/25/2013 Document Revised: 08/17/2017 Document Reviewed: 06/05/2017 Elsevier Patient Education  2020 [employer]. Metformin tablets Qu es este medicamento? La METFORMINA se [place] para tratar la diabetes tipo 2. Ayuda a Advice worker de Dispensing optician. El tratamiento se [place] con ejercicios y [place]. Este [employer] se puede usar solo o con otros medicamentos para la diabetes, incluyendo la insulina. Este medicamento puede ser utilizado para otros usos; si  tiene alguna pregunta consulte con su proveedor de atencin mdica o con su farmacutico. MARCAS COMUNES: Glucophage Qu le debo informar a mi profesional de la salud antes de tomar este medicamento? Necesitan saber si usted presenta alguno de los siguientes problemas o situaciones: anemia deshidratacin enfermedad cardiaca bebe con frecuencia bebidas con alcohol enfermedad renal enfermedad heptica sndrome de ovario poliqustico infeccin o lesin grave vmito una reaccin alrgica o inusual a la metformina, a otros medicamentos, alimentos, colorantes o conservantes si est embarazada o buscando quedar embarazada si est amamantando a un beb Cmo debo utilizar este medicamento? Tome este medicamento por va oral con un vaso de agua. Siga las instrucciones de la etiqueta del [place].  Tome este medicamento con alimentos. Tome su medicamento a intervalos regulares. No tome su medicamento con una frecuencia mayor a la indicada. No deje de tomarlo, excepto si as lo indica su mdico. Hable con su pediatra para informarse acerca del uso de este medicamento en nios. Aunque este medicamento se puede recetar a nios tan pequeos como de 10 aos de edad en casos selectos, existen precauciones que deben tomarse. Sobredosis: Pngase en contacto inmediatamente con un centro toxicolgico o una sala de urgencia si usted cree que haya tomado demasiado medicamento. ATENCIN: [employer] es solo para usted. No comparta este medicamento con nadie. Qu sucede si me olvido de una dosis? Si olvida una dosis, tmela lo antes posible. Si es casi la hora de su dosis siguiente, tome slo esa dosis. No tome dosis adicionales o dobles. Qu puede interactuar con este medicamento? No tome este medicamento con ninguno de los siguientes frmacos: ciertos medicamentos de [employer] se administran antes de una radiografa, tomografa computarizada, IRM (MRI) u otros procedimientos dofetilida Este medicamento tambin puede interactuar con los siguientes frmacos: acetazolamida alcohol ciertos medicamentos antivirales para VIH o hepatitis ciertos medicamentos para la presin sangunea, enfermedad cardiaca y ritmo cardiaco irregular cimetidina diclorfenamida digoxina diurticos hormonas femeninas, como estrgenos o progestinas, y pldoras anticonceptivas glicopirrolato isoniazida lamotrigina memantina metazolamida metoclopramida midodrina niacina fenotiazinas, tales como clorpromazina, mesoridazina, proclorperazina, tioridazina fenitona ranolazina medicamentos esteroideos, como la prednisona o la cortisona medicamentos estimulantes para trastornos de Freight forwarder, Sports coach de peso o mantenerse despierto medicamentos para la tiroides topiramato trospio vandetanib zonisamida Puede ser que esta lista no menciona todas  las posibles interacciones. Informe a su profesional de [employer]	[place] de [employer] productos a base de hierbas, medicamentos de [place] o suplementos nutritivos que est tomando. Si usted fuma, consume bebidas alcohlicas o si utiliza drogas ilegales, indqueselo tambin a su profesional de [employer]	[place]. Algunas sustancias pueden interactuar con su medicamento. A qu debo estar atento al usar [employer]? Visite a su mdico o a su profesional de la salud para que revise regularmente su evolucin. Se monitorizar una prueba llamada Hemoglobina A1C (A1C). Es un anlisis de sangre sencillo. Mide su control del nivel de azcar en la sangre durante los ltimos 2 a 3 meses. Se le realizar esta prueba cada 3 a 6 meses. Aprenda a revisar su nivel de azcar en la sangre. Conozca los sntomas del nivel bajo y elevado de azcar en la sangre y cmo controlarlos. Siempre lleve con usted una fuente rpida de azcar por si tiene sntomas de nivel bajo de azcar [employer]. Algunos ejemplos incluyen caramelos duros de azcar o tabletas de glucosa. Asegrese de que otras personas sepan que usted se puede ahogar si come o bebe cuando presenta sntomas graves de nivel bajo de azcar en la [place], [place]  convulsiones o prdida del conocimiento. Deben obtener ayuda mdica de inmediato. Informe a su mdico o profesional de la salud si tiene niveles elevados de Dispensing optician. Es posible que deba cambiar la dosis de su medicamento. Si est enfermo o hace ms ejercicio que lo habitual, es posible que necesite cambiar la dosis de su medicamento. No saltee comidas. Pregunte a su mdico o profesional de la salud si debe evitar el alcohol. Muchos productos de venta libre para la tos y el resfro contienen azcar o alcohol. Estos pueden afectar los niveles de Dispensing optician. Este medicamento puede causar ovulacin en mujeres premenopusicas que no tienen periodos mensuales regulares. [place] posibilidades de  quedar embarazada. No debe tomar este medicamento si queda embarazada o si cree que puede estar embarazada. Hable con su mdico o profesional de [employer] sus opciones de mtodos anticonceptivos mientras toma este medicamento. Contacte de inmediato a su mdico o profesional de la salud si cree que est embarazada. Si va a someterse a una operacin, IRM (MRI), tomografa computarizada u otro procedimiento, informe a su mdico que est tomando [employer]. Puede ser necesario dejar de usar este medicamento antes del procedimiento. Use un brazalete o una cadena de identificacin mdica, y lleve con usted una tarjeta que describa su enfermedad, detalles de su medicamento y horario de dosis. Este medicamento puede causar una disminucin del cido flico y de la vitamina B12. Debe asegurarse de recibir suficientes vitaminas mientras toma este medicamento. Converse con su profesional de la salud sobre los alimentos que come y las vitaminas que toma. Qu efectos secundarios puedo tener al [employer] este medicamento? Efectos secundarios que debe informar a su mdico o a Barrister's clerk de la salud tan pronto como sea posible: Chief of Staff, como erupcin cutnea, comezn/picazn o urticaria, e hinchazn de la cara, los labios o la lengua problemas respiratorios sensacin de [place] o aturdimiento, cadas dolores musculares signos y sntomas de niveles bajos de Dispensing optician, tales como ansiedad, confusin, [place], aumento del apetito, debilidad o cansancio inusuales, sudoracin, temblores, fro, irritabilidad, Social research officer, government de cabeza, visin borrosa, ritmo cardiaco rpido, prdida del conocimiento ritmo cardiaco lento o irregular dolor o malestar estomacal inusual cansancio o debilidad inusual Efectos secundarios que generalmente no requieren atencin mdica (infrmelos a su mdico o a Barrister's clerk de la salud si persisten o si son molestos): diarrea dolor de cabeza Geographical information systems officer estomacal sabor metlico en  la boca nuseas gases, Higher education careers adviser Puede ser que esta lista no menciona todos los posibles efectos secundarios. Comunquese a su mdico por asesoramiento mdico [employer]. Usted puede informar los efectos secundarios a la FDA por telfono al 1-800-FDA-1088. Dnde debo guardar mi medicina? Mantngala fuera del alcance de los nios. Gurdela a [employer], entre 15 y 49 grados C (42 y 2 grados F). Protjala de la humedad y de Naval architect. Deseche todo el medicamento que no haya utilizado, despus de la fecha de vencimiento. ATENCIN: Este folleto es un resumen. Puede ser que no cubra toda la posible informacin. Si usted tiene preguntas acerca de esta medicina, consulte con su mdico, su farmacutico o su profesional de Technical sales engineer.  2020 Elsevier/Gold Standard (2017-08-08 00:00:00) Prediabetes Prediabetes La prediabetes es la afeccin de tener un nivel de azcar en la sangre (glucemia ms alto de lo normal, aunque no lo suficientemente alto para recibir un diagnstico de diabetes tipo2. El hecho de ser prediabtico lo pone en riesgo de desarrollar diabetes tipo 2 (  diabetes mellitus tipo2). La prediabetes puede denominarse intolerancia a la glucosa o alteracin de la glucosa en ayunas. Generalmente, la prediabetes no causa sntomas. El mdico puede diagnosticar esta afeccin por los anlisis de [place]. Es posible que le realicen un anlisis para determinar si tiene prediabetes si tiene sobrepeso y al menos algn otro factor de riesgo de prediabetes. Qu es la glucemia y cmo se mide? La glucemia es la cantidad de glucosa presente en el torrente sanguneo. La glucosa proviene de los alimentos que ingiere que contienen azcares y almidones (hidratos de carbono), que el cuerpo descompone en glucosa. El nivel de glucemia se puede medir en mg/dl (miligramos por decilitro) o mmol/l (milimoles por litro). La glucemia puede comprobarse con uno o ms de los siguientes anlisis de  sangre:  Prueba de la glucemia en ayunas. No se le permitir comer (tendr que hacer ayuno) durante 8horas o ms antes de que se le tome una [place]. ? El rango normal de glucemia en ayunas es de 70 a 142m/dl (3,9 a 5,6647ml/l).  Una prueba de sangre de A1c (hemoglobina A1c). Esta prueba proporciona informacin sobre el control de la glucemia durante los ltimos 2 o 47m58ms.  Prueba de tolerancia a la glucosa oral (PTGO). Esta prueba mide la glucemia en dos momentos: ? Despus de ayunar. Este es el valor inicial. ? Dos horas despus de tomar una bebida que contiene glucosa. Pueden diagnosticarle prediabetes en los siguientes casos:  Si su glucemia en ayunas es de 100 a 125 mg/dl (5,6 a 6,9 mmol/l).  Si su nivel de A1c es de 5,7 a 6,4%.  Si su resultado de la PTGO es de 140 a 199m41m (7,8 a 11mm57m). Estas pruebas de sangre se pueden repetir para confiFreight forwarder puede afectarme esta enfermedad? El pncreas produce una hormona (insulina) que ayuda a moverCivil Service fast streamerosa del torrente sanguneo al interior de las clulas. Cuando las clulas del cuerpo no responden adecuadamente a la insulina que el cuerpo produce (resistencia a la insulina), se acumula exceso de glucosa en la sangre en lugar de entrar en las clulas. Como resulAshlandpuede producir un nivel alto de glucemia (hiperglucemia), lo cual puede causar muchas complicaciones. La hiperglucemia es un sntoma de prediabetes. Tener la glucemia alta durante mucho tiempo es peligroso. Demasiada glucosa en la sangre puede daar los nervios y los vasos sanguneos. El dao prolongado puede derivar en complicaciones de la diabetes, que pueden incluir:  Enfermedad cardaca.  Accidente cerebrovascular.  Ceguera.  Enfermedad renal.  Depresin.  Circulacin deficiente en los pies y las piernas, lo cual podra ocasionar una extirpacin quirrgica (amputacin) en casos graves. Qu puTusayanunos de los factores de riesgo de prediabetes son los siguientes:  TenerBest boyamiliar con diabetes tipo2.  Tener exceso de peso [place] Lakesesidad.  Ser mayor de 45aos36aosdad.  Ser descendiente de indgenas norteamericanos, afroamericanos, hispanos o latinos, o asiticos o isleos del Pacfico.  Tener un estilo de vida inactivo (sedentario).  Tener antecedentes de enfermedades cardacas.  En las mujeres, tener antecedentes de diabetes gestacional o sndrome del ovario poliqustico (SOP).  Tener niveles bajos del colesterol bueno (HDL-C) o niveles altos de grasas en la sangre (triglicridos).  Tener presin arterial alta. Qu puedo hacer para prevenir la diabetes?      Haga actividad fsica. ? Haga actividad fsica de intensidad moderada durante 30min44m o ms 5das por semana o con la frecuencia que le indique su mdico. Esto podra incluir caminatas  dinmicas, ciclismo o gimnasia acutica. ? Pregntele al mdico qu actividades son seguras para usted. Una combinacin de actividades puede ser la mejor opcin, por ejemplo, caminar, practicar natacin, andar en bicicleta y hacer entrenamiento de fuerza.  Baje de [employer] se lo haya indicado el mdico. ? Bajar entre el 5% y el 7% del peso corporal puede revertir la resistencia a la insulina. ? El mdico puede determinar cunto peso tiene que perder y Hotel manager a que adelgace de Geographical information systems officer segura.  Siga un plan de alimentacin saludable. Este incluye consumir protenas magras, hidratos de carbono complejos, frutas y verduras frescas, productos lcteos con bajo contenido de grasa y grasas saludables. ? Siga las indicaciones del mdico respecto de las restricciones de comidas o bebidas. ? Programe una cita con un especialista en alimentacin y nutricin (nutricionista certificado) para que lo ayude a Probation officer plan de alimentacin saludable adecuado para usted.  No fume ni consuma ningn producto que contenga tabaco, lo que  incluye cigarrillos, tabaco de Higher education careers adviser y Psychologist, sport and exercise. Si necesita ayuda para dejar de fumar, consulte al [employer].  [employer] recetados y de venta libre como se lo haya indicado el mdico. Posiblemente le receten medicamentos para ayudarlo a reducir el riesgo de tener diabetes tipo2.  Concurra a todas las visitas de seguimiento como se lo haya indicado el mdico. Esto es importante. Resumen  La prediabetes es la afeccin de tener un nivel de azcar en la sangre (glucemia ms alto de lo normal, aunque no lo suficientemente alto para recibir un diagnstico de diabetes tipo2.  El hecho de ser prediabtico lo pone en riesgo de desarrollar diabetes tipo 2 (diabetes mellitus tipo2).  Para ayudar a prevenir la diabetes tipo2, haga cambios en el estilo de vida, como realizar actividad fsica y comer alimentos saludables. Baje de [employer] se lo haya indicado el mdico. Esta informacin no tiene Marine scientist el consejo del mdico. Asegrese de hacerle al mdico cualquier pregunta que tenga. Document Released: 06/28/2015 Document Revised: 08/05/2017 Document Reviewed: 06/28/2015 Elsevier Patient Education  2020 [employer].

## 2018-11-25 LAB — LIPID PANEL
Chol/HDL Ratio: 3.3 ratio (ref 0.0–5.0)
Cholesterol, Total: 202 mg/dL — ABNORMAL HIGH (ref 100–199)
HDL: 62 mg/dL (ref >39)
Triglycerides: 420 mg/dL — ABNORMAL HIGH (ref 0–149)

## 2018-11-25 LAB — CBC WITH DIFFERENTIAL/PLATELET
Basophils Absolute: 0 10*3/uL (ref 0.0–0.2)
Basos: 0 %
EOS (ABSOLUTE): 0 10*3/uL (ref 0.0–0.4)
Eos: 0 %
Hematocrit: 46 % (ref 37.5–51.0)
Hemoglobin: 15.4 g/dL (ref 13.0–17.7)
Immature Grans (Abs): 0 10*3/uL (ref 0.0–0.1)
Immature Granulocytes: 0 %
Lymphocytes Absolute: 1.6 10*3/uL (ref 0.7–3.1)
Lymphs: 21 %
MCH: 31.9 pg (ref 26.6–33.0)
MCHC: 33.5 g/dL (ref 31.5–35.7)
MCV: 95 fL (ref 79–97)
Monocytes Absolute: 0.5 10*3/uL (ref 0.1–0.9)
Monocytes: 6 %
Neutrophils Absolute: 5.3 10*3/uL (ref 1.4–7.0)
Neutrophils: 73 %
Platelets: 245 10*3/uL (ref 150–450)
RBC: 4.83 x10E6/uL (ref 4.14–5.80)
RDW: 14.7 % (ref 11.6–15.4)
WBC: 7.4 10*3/uL (ref 3.4–10.8)

## 2018-11-25 LAB — COMPREHENSIVE METABOLIC PANEL
ALT: 34 IU/L (ref 0–44)
AST: 51 IU/L — ABNORMAL HIGH (ref 0–40)
Albumin/Globulin Ratio: 1.8 (ref 1.2–2.2)
Albumin: 4.9 g/dL (ref 3.8–4.9)
Alkaline Phosphatase: 94 IU/L (ref 39–117)
BUN/Creatinine Ratio: 14 (ref 9–20)
BUN: 11 mg/dL (ref 6–24)
Bilirubin Total: 0.5 mg/dL (ref 0.0–1.2)
CO2: 18 mmol/L — ABNORMAL LOW (ref 20–29)
Calcium: 9 mg/dL (ref 8.7–10.2)
Chloride: 103 mmol/L (ref 96–106)
Creatinine, Ser: 0.76 mg/dL (ref 0.76–1.27)
GFR calc Af Amer: 120 mL/min/{1.73_m2} (ref >59)
GFR calc non Af Amer: 104 mL/min/{1.73_m2} (ref >59)
Globulin, Total: 2.7 g/dL (ref 1.5–4.5)
Glucose: 108 mg/dL — ABNORMAL HIGH (ref 65–99)
Potassium: 4 mmol/L (ref 3.5–5.2)
Sodium: 141 mmol/L (ref 134–144)
Total Protein: 7.6 g/dL (ref 6.0–8.5)

## 2018-11-25 LAB — VITAMIN B12: Vitamin B-12: 263 pg/mL (ref 232–1245)

## 2018-11-25 LAB — PSA: Prostate Specific Ag, Serum: 1.3 ng/mL (ref 0.0–4.0)

## 2018-11-25 LAB — VITAMIN D 25 HYDROXY (VIT D DEFICIENCY, FRACTURES): Vit D, 25-Hydroxy: 29 ng/mL — ABNORMAL LOW (ref 30.0–100.0)

## 2018-11-28 ENCOUNTER — Telehealth: Payer: Self-pay | Admitting: Family Medicine

## 2018-11-28 NOTE — Telephone Encounter (Signed)
Attempted to contact patient via Hispanic Interpreter line to review lab recent lab results.

## 2018-12-03 ENCOUNTER — Telehealth: Payer: Self-pay | Admitting: Family Medicine

## 2018-12-03 NOTE — Telephone Encounter (Signed)
Multiple attempts to contact patient via Hispanic Interpreter line to review lab recent lab results. Voicemail is full, so unable to leave message at this time. We will mail lab results to patient today.    Kathe Becton,  MSN, FNP-BC Patient [place] [address] [place], [place] [address] [phone]

## 2018-12-25 MED FILL — OMEPRAZOLE 20 MG CAP: 20 | 30 days supply | Qty: 30 | Fill #1

## 2018-12-25 MED FILL — LISINOPRIL 5 MG TAB: 5 | 30 days supply | Qty: 30 | Fill #1

## 2018-12-25 MED FILL — metFORMIN HCL 1000 MG TABS: 1000 | 30 days supply | Qty: 60 | Fill #1

## 2018-12-26 ENCOUNTER — Other Ambulatory Visit: Payer: Self-pay

## 2018-12-26 ENCOUNTER — Ambulatory Visit (INDEPENDENT_AMBULATORY_CARE_PROVIDER_SITE_OTHER): Payer: Self-pay | Admitting: Family Medicine

## 2018-12-26 ENCOUNTER — Encounter: Payer: Self-pay | Admitting: Family Medicine

## 2018-12-26 VITALS — BP 122/77 | HR 70 | Temp 98.1°F | Ht 68.0 in | Wt 165.0 lb

## 2018-12-26 DIAGNOSIS — E118 Type 2 diabetes mellitus with unspecified complications: Secondary | ICD-10-CM

## 2018-12-26 DIAGNOSIS — I10 Essential (primary) hypertension: Secondary | ICD-10-CM

## 2018-12-26 DIAGNOSIS — R21 Rash and other nonspecific skin eruption: Secondary | ICD-10-CM

## 2018-12-26 DIAGNOSIS — M542 Cervicalgia: Secondary | ICD-10-CM

## 2018-12-26 DIAGNOSIS — Z09 Encounter for follow-up examination after completed treatment for conditions other than malignant neoplasm: Secondary | ICD-10-CM

## 2018-12-26 DIAGNOSIS — E785 Hyperlipidemia, unspecified: Secondary | ICD-10-CM

## 2018-12-26 LAB — POCT URINALYSIS DIP (MANUAL ENTRY)
Bilirubin, UA: NEGATIVE
Blood, UA: NEGATIVE
Glucose, UA: NEGATIVE mg/dL
Ketones, POC UA: NEGATIVE mg/dL
Leukocytes, UA: NEGATIVE
Nitrite, UA: NEGATIVE
Protein Ur, POC: NEGATIVE mg/dL
Spec Grav, UA: 1.02 (ref 1.010–1.025)
Urobilinogen, UA: 1 E.U./dL
pH, UA: 8 (ref 5.0–8.0)

## 2018-12-26 MED ORDER — IBUPROFEN 800 MG PO TABS: 800.0000 mg | ORAL_TABLET | Freq: Three times a day (TID) | ORAL | 3 refills | Status: DC | PRN

## 2018-12-26 MED ORDER — PRAVASTATIN SODIUM 40 MG PO TABS: 40.0000 mg | ORAL_TABLET | Freq: Every day | ORAL | 3 refills | Status: DC

## 2018-12-26 MED ORDER — HYDROCORTISONE 1 % EX CREA: TOPICAL_CREAM | CUTANEOUS | 3 refills | Status: DC

## 2018-12-26 MED FILL — IBUPROFEN 800 MG TABLET: 800 | 10 days supply | Qty: 30 | Fill #0

## 2018-12-26 MED FILL — PRAVASTATIN NA 40 MG TAB: 40 | 30 days supply | Qty: 30 | Fill #0

## 2018-12-26 NOTE — Patient Instructions (Addendum)
Dolor musculoesquelticoCuando Conservation officer, historic buildings es intenso Musculoskeletal Pain "Dolor musculoesqueltico" hace referencia a los dolores y las American Standard Companies, las articulaciones, los msculos y los tejidos que los rodean. Este dolor puede ocurrir en cualquier parte del cuerpo. Puede durar un breve perodo (agudo) o prolongarse mucho tiempo (crnico). Es posible que se realicen un examen fsico, anlisis de laboratorio y estudios de diagnstico por imgenes para Animator causa del dolor musculoesqueltico. Siga estas indicaciones en su casa:  Estilo de vida  Trate de controlar o reducir los niveles de estrs. El estrs aumenta la tensin muscular y Biochemist, clinical musculoesqueltico. Es importante reconocer cuando est ansioso o estresado y aprender distintas formas de Market researcher. Estas pueden incluir, entre [place], las siguientes: ? Yoga o meditacin. ? Terapia cognitiva o conductual. ? Acupuntura o terapia de masajes.  Podr seguir con todas las actividades, a menos que Magazine features editor generen ms ARAM[employer] Corporation. Cuando el dolor [place], retome las actividades habituales de a poco. Aumente gradualmente la intensidad y la duracin de las actividades o del ejercicio que realice. Control del dolor, la rigidez y Engineer, structural los medicamentos de venta libre y los recetados solamente como se lo haya indicado el mdico.  Si el dolor es intenso, el reposo en cama puede ser beneficioso. Acustese o sintese en cualquier posicin que sea cmoda, pero salga de la cama y camine al SunTrust.  Si se lo indican, aplique calor en la zona afectada con la frecuencia que le haya indicado el mdico. Use la fuente de calor que el mdico le recomiende, como una compresa de calor hmedo o una almohadilla trmica. ? Coloque una Gen[employer]ine [employer] piel y la fuente de Freight forwarder. ? Aplique el calor durante un perodo de 20a16minutos. ? Retire la fuente de calor si la piel se pone de color rojo  brillante. Esto es especialmente importante si no puede sentir dolor, calor o fro. Puede correr un riesgo mayor de sufrir quemaduras.  Si se lo indican, aplique hielo sobre la zona dolorida. ? Ponga el hielo en una bolsa plstica. ? Coloque una [employer] piel y la bolsa de hielo. ? Coloque el hielo durante 1minutos, de 2a3veces por da. Instrucciones generales  El mdico puede recomendarle que consulte a un fisioterapeuta. Esta persona puede ayudarlo a elaborar un programa de ejercicios seguro. Haga ejercicios como se lo haya indicado el fisioterapeuta.  Concurra a todas las visitas de control, incluidas las sesiones de fisioterapia, como se lo hayan indicado los mdicos. Esto es importante. Comunquese con un mdico si:  El Holiday representative.  Los medicamentos no Financial trader.  No puede usar la parte del cuerpo que le duele, como un brazo, una pierna o el cuello.  Tiene dificultad para dormir.  Tiene dificultad para Calpine Corporation cotidianas. Solicite ayuda de inmediato si:  Tiene una nueva lesin o el dolor empeora o es diferente.  Tiene adormecimiento u hormigueo en la zona dolorida. Resumen  "Dolor musculoesqueltico" hace referencia a los dolores y las American Standard Companies, las articulaciones, los msculos y los tejidos Avaya rodean.  Este dolor puede ocurrir en cualquier parte del cuerpo.  El mdico puede recomendarle que consulte a un fisioterapeuta. Esta persona puede ayudarlo a elaborar un programa de ejercicios seguro. Haga ejercicios como se lo haya indicado el fisioterapeuta.  Disminuya su nivel de estrs. El estrs puede Occupational hygienist musculoesqueltico. [place] los mtodos para disminuir el estrs se pueden  mencionar la PG&E Corporationmeditacin, el yoga, la terapia cognitiva o conductual, la acupuntura y la terapia de Sinclairvillemasajes. Esta informacin no tiene Theme park managercomo fin reemplazar el consejo del mdico. Asegrese de hacerle al mdico cualquier pregunta que  tenga. Document Released: 02/14/2005 Document Revised: 02/04/2017 Document Reviewed: 02/04/2017 Elsevier Patient Education  2020 Arvin[employer]vier Inc. Ibuprofen tablets and capsules Qu es este medicamento? El CliffordBUPROFENO es un medicamento antiinflamatorio no esteroideo (AINE). Este medicamento se puede Chemical engineerutilizar para Nurse, adultel dolor dental, fiebre, dolor de cabeza o migraas, artritis reumatoide, osteoartritis y perodos SYSCOmenstruales dolorosos. Alivia tambin los sntomas de molestias y dolores menores provocados por resfros, gripe, o dolor de Advertising copywritergarganta. Este medicamento puede ser utilizado para otros usos; si tiene alguna pregunta consulte con su proveedor de atencin mdica o con su farmacutico. MARCAS COMUNES: Advil, Advil Designer, multimediaJunior Strength, Advil Migraine, Genpril, Ibren, IBU, Midol, Midol Cramps and Body Aches, Motrin, Motrin IB, Motrin Junior Strength, Motrin Migraine Pain, Samson-8, Toxicology Saliva Collection Wm. Wrigley Jr. CompanyQu le debo informar a mi profesional de la salud antes de tomar este medicamento? Necesitan saber si usted presenta alguno de los siguientes problemas o situaciones: fuma ciruga de bypass coronario con injerto en las ltimas 2 semanas consume ms de 3 bebidas alcohlicas por da enfermedad cardiaca presin sangunea alta antecedentes de hemorragia estomacal enfermedad renal enfermedad heptica enfermedad pulmonar o respiratoria, como asma una reaccin alrgica o inusual al ibuprofeno, a la aspirina, a otros AINE, a otros medicamentos, alimentos, colorantes o conservantes si est embarazada o buscando quedar embarazada si est amamantando a un beb Cmo debo SLM Corporationutilizar este medicamento? Tome este medicamento por va oral con un vaso de agua. Siga las instrucciones de la etiqueta del Lorenzomedicamento. Si este medicamento le produce Programme researcher, broadcasting/film/videomalestar estomacal, tmelo con alimentos. Trate de no acostarse por lo menos 10 minutos despus de tomarlo. Tome sus dosis a intervalos regulares. No tome su medicamento con una  frecuencia mayor a la indicada. Su farmacutico le dar una [place]a del medicamento especial con cada receta y relleno. Asegrese de leer esta informacin cada vez cuidadosamente. Hable con su pediatra para informarse acerca del uso de este medicamento en nios. Puede requerir atencin especial. Sobredosis: Pngase en contacto inmediatamente con un centro toxicolgico o una sala de urgencia si usted cree que haya tomado demasiado medicamento. ATENCIN: Reynolds AmericanEste medicamento es solo para usted. No comparta este medicamento con nadie. Qu sucede si me olvido de una dosis? Si olvida una dosis, tmela lo antes posible. Si es casi la hora de la prxima dosis, tome slo esa dosis. No tome dosis adicionales o dobles. Qu puede interactuar con este medicamento? No tome esta medicina con ninguno de los siguientes medicamentos:  cidofovir  quetorolac  metotrexato  pemetrexed Esta medicina tambin puede interactuar con los siguientes medicamentos:  alcohol  aspirina  diurticos  litio  otros medicamentos para la inflamacin, como prednisona  warfarina Puede ser que esta lista no menciona todas las posibles interacciones. Informe a su profesional de Beazer Homesla salud de Ingram Micro Inctodos los productos a base de hierbas, medicamentos de Silver Cityventa libre o suplementos nutritivos que est tomando. Si usted fuma, consume bebidas alcohlicas o si utiliza drogas ilegales, indqueselo tambin a su profesional de Beazer Homesla salud. Algunas sustancias pueden interactuar con su medicamento. A qu debo estar atento al usar PPL Corporationeste medicamento? Consulte a su mdico o su profesional de la salud si sus sntomas no comienzan a mejorar o si empeoran. Este medicamento no previene ataques cardacos o derrames cerebrales. [place] hecho, este medicamento puede aumentar la posibilidad de Marine scientistpadecer un ataque cardaco  o un derrame cerebral. La posibilidad puede aumentar con el uso prolongado de este medicamento y en pacientes con enfermedad cardiaca. Si est tomando  aspirina para la prevencin de ataques cardacos o derrames cerebrales, comunquese con su mdico o su profesional de Beazer Homes. Evite tomar otros medicamentos que contienen aspirina, ibuprofeno o naproxeno con PPL Corporation. Es probable que se Special educational needs teacher secundarios, tales como molestias estomacales, nuseas o lceras. No debe tomar este medicamento con muchos medicamentos disponibles de [employer]. Este medicamento puede provocar lceras y hemorragia del estmago e intestinos en cualquier momento durante tratamiento. Pueden ocurrir lceras y hemorragia sin sntomas de Control and instrumentation engineer y Financial controller la [place]. Para reducir su riesgo, no fume ni consume alcohol mientras est tomando este medicamento. Puede experimentar mareos o somnolencia. No conduzca ni utilice maquinaria ni haga nada que Scientist, research (life sciences) en estado de alerta hasta que sepa cmo le afecta este medicamento. No se siente ni se ponga de pie con rapidez, especialmente si es un paciente de edad avanzada. Esto reduce el riesgo de mareos o [employer]ell [employer]bermaid. Este medicamento puede hacerle sangrar con mayor facilidad. Trate de no lastimarse los dientes y las encas al cepillarlos o limpiarlos con hilo dental. Este medicamento se puede utilizar para tratar migraas. Si toma medicamentos para migraas por 10 das o ms en un mes, las migraas se Buyer, retail. Mantenga un diario de [address]  [address] de [address] de [place] y el uso de [place]. Consulte a su profesional de la salud si los ataques de migraas ocurren con ms frecuencia. Qu efectos secundarios puedo tener al Boston Scientific este medicamento? Efectos secundarios que debe informar a su mdico o a Producer, television/film/video de la salud tan pronto como sea posible:  Therapist, art como erupcin cutnea, picazn o urticarias, hinchazn de la cara, labios o lengua  dolor de estmago severo  signos y sntomas de sangrado tales como heces de color oscuro, con sangre o con aspecto alquitranado; orina roja o marrn  oscura; escupir sangre o material marrn que parece granos de caf; puntos rojos en la piel; sangrado o magulladuras inusuales de los ojos, encas o Clinical cytogeneticist  signos y sntomas de un cogulo sanguneo tales como cambios en la visin; Journalist, newspaper; dolor de cabeza severa, repentina; dificultad para hablar; entumecimiento o debilidad repentina de la cara, brazo o pierna  hinchazn o aumento de peso sin explicacin  cansancio o debilidad inusual  color amarillento de los ojos o la piel Efectos secundarios que, por lo general, no requieren atencin mdica (debe informarlos a su mdico o a Producer, television/film/video de la salud si persisten o si son molestos):  magulladuras  diarrea  mareos, somnolencia  dolor de cabeza  nuseas,vmito Puede ser que esta lista no menciona todos los posibles efectos secundarios. Comunquese a su mdico por asesoramiento mdico Hewlett-Packard. Usted puede informar los efectos secundarios a la FDA por telfono al 1-800-FDA-1088. Dnde debo guardar mi medicina? Mantngala fuera del alcance de los nios. Gurdela a Sanmina-SCI, entre 15 y 30 grados C (74 y 41 grados F). Mantenga el envase bien cerrado. Deseche todo el medicamento que no haya utilizado, despus de la fecha de vencimiento. ATENCIN: Este folleto es un resumen. Puede ser que no cubra toda la posible informacin. Si usted tiene preguntas acerca de esta medicina, consulte con su mdico, su farmacutico o su profesional de Radiographer, therapeutic.  2020 Elsevier/Gold Standard (2017-02-28 00:00:00)   Hydrocortisone; Pramoxine Cream, Ointment, or Lotion Qu es este medicamento? Lillard Anes; PRAMOXINA es  una combinacin de un corticosteroide y un anestsico. Se utiliza para disminuir la hinchazn, la picazn y Chief Technology Officerel dolor causado por irritaciones menores de la piel o hemorroides. Este medicamento puede ser utilizado para otros usos; si tiene Jerseyalguna pregunta consulte con su proveedor de atencin  mdica o con su farmacutico. MARCAS COMUNES: Analpram E, Analpram HC, EndaRoid, Enzone, HC Pram, Mezparox-HC, Mezparox-HC Forte, DearbornNovacort, LandAmerica FinancialParamox-HC, Kelly ServicesPRAM-HCA, Scientist, water qualityramCort, LyndhurstPramosone, [address] StreetPramosone E, ProCort, American Electric PowerProctocream-HC, Z-Pram, Zone A, Zone A Forte, ZyPram Wm. Wrigley Jr. CompanyQu le debo informar a mi profesional de la salud antes de tomar este medicamento? Necesitan saber si usted presenta alguno de los siguientes problemas o situaciones: cualquier infeccin activa grandes reas de piel quemada o daada enfermedad renal enfermedad heptica adelgazamiento o desgaste de la piel una reaccin alrgica o inusual a la hidrocortisona, a los corticosteroides, a la pramoxina, a otros medicamentos, alimentos, colorantes o conservantes si est embarazada o buscando quedar embarazada si est amamantando a un beb Cmo debo utilizar este medicamento? Este medicamento es slo para uso externo. No lo ingiera por va oral. Siga las instrucciones de la etiqueta del medicamento. Lvese las manos antes y despus de usarlo. Aplique una pequea capa sobre las zonas afectadas. No la cubra con un vendaje o apsito a menos que se lo indique su mdico o su profesional de Radiographer, therapeuticla salud. No lo utilice sobre la piel sana o sobre grandes zonas de la piel. Evite que el medicamento entre en contacto con sus ojos. Si esto ocurre, enjuguelos con abundante agua fra del grifo. Es importante que no utilice ms medicamento que lo indicado. No utilice su medicamento con una frecuencia mayor a la indicada. No debe utilizarlo durante ms de [address]14 das. Hable con su pediatra para informarse acerca del uso de este medicamento en nios. Puede requerir atencin especial. Si est aplicando este medicamento sobre la zona Mellon Financialdonde le coloca los paales al nio, no la [place]alt[place]ubra con paales o con calzones de plstico ajustados. Esto puede aumentar la cantidad de medicamento que penetra en la piel, lo cual aumenta el riesgo de tener efectos secundarios graves. Los pacientes de Timor-Lesteedad  avanzada son ms propensos a sufrir lesiones en la piel debido al envejecimiento y esto puede aumentar los efectos secundarios. Este medicamento debe utilizarse solamente durante perodos breves y con muy poca frecuencia en pacientes de edad avanzada. Sobredosis: Pngase en contacto inmediatamente con un centro toxicolgico o una sala de urgencia si usted cree que haya tomado demasiado medicamento. ATENCIN: Reynolds AmericanEste medicamento es solo para usted. No comparta este medicamento con nadie. Qu sucede si me olvido de una dosis? Si olvida una dosis, aplquela lo antes posible. Si es casi la hora de la prxima dosis, aplique slo esa dosis. No use dosis adicionales o dobles. Qu puede interactuar con este medicamento? No se esperan interacciones. No utilice otros productos de la piel sobre la zona que est siendo tratada sin Science writerconsultar a su mdico o a su profesional de Radiographer, therapeuticla salud. Puede ser que esta lista no menciona todas las posibles interacciones. Informe a su profesional de Beazer Homesla salud de Ingram Micro Inctodos los productos a base de hierbas, medicamentos de Merrionette Parkventa libre o suplementos nutritivos que est tomando. Si usted fuma, consume bebidas alcohlicas o si utiliza drogas ilegales, indqueselo tambin a su profesional de Beazer Homesla salud. Algunas sustancias pueden interactuar con su medicamento. A qu debo estar atento al usar PPL Corporationeste medicamento? Si los sntomas no mejoran dentro de [place] semana, informe a su mdico o a su profesional de Beazer Homesla salud. Informe a su mdico o  su profesional de la salud si est en contacto con personas con sarampin o varicela, o si desarrolla llagas o ampollas que no se curan bien. Qu efectos secundarios puedo tener al Boston Scientificutilizar este medicamento? Efectos secundarios que debe informar a su mdico o a Producer, television/film/videosu profesional de la salud tan pronto como sea posible:  Therapist, artreacciones alrgicas como erupcin cutnea, picazn o urticarias, hinchazn de la cara, labios o lengua  protuberancias o manchas rojas oscuras en la  piel  la piel no se cura  infeccin de la piel  irritacin de la piel  adelgazamiento de la piel Efectos secundarios que, por lo general, no requieren atencin mdica (debe informarlos a su mdico o a su profesional de la salud si persisten o si son molestos):  sensacin de ardor o escozor  sequedad Puede ser que esta lista no menciona todos los posibles efectos secundarios. Comunquese a su mdico por asesoramiento mdico Hewlett-Packardsobre los efectos secundarios. Usted puede informar los efectos secundarios a la FDA por telfono al 1-800-FDA-1088. Dnde debo guardar mi medicina? Mantngala fuera del alcance de los nios. Gurdela a Sanmina-SCItemperatura ambiente, entre 15 y 30 grados C ([id number] y [phone] grados F). No la congele. Deseche todos los medicamentos que no haya utilizado, despus de la fecha de vencimiento. ATENCIN: Este folleto es un resumen. Puede ser que no cubra toda la posible informacin. Si usted tiene preguntas acerca de esta medicina, consulte con su mdico, su farmacutico o su profesional de Radiographer, therapeuticla salud.  2020 Elsevier/Gold Standard (2018-05-29 00:00:00)

## 2018-12-26 NOTE — Progress Notes (Signed)
Patient [facility]   Established Patient Office Visit  Subjective:  Patient ID: Derek Cruz, male    DOB: 06-Dec-1965  Age: 53 y.o. MRN: [id number]  CC:  Chief Complaint  Patient presents with  . Follow-up    Chronic condition     HPI Derek Cruz is a 53 year old presents for follow up today.   Past Medical History:  Diagnosis Date  . Chest pain   . Dyslipidemia   . Gastritis   . Hypertension    Current Status: Since his last office visit, he is doing well with no complaints. He denies visual changes, chest pain, cough, shortness of breath, heart palpitations, and falls. He has occasional headaches and dizziness with position changes. Denies severe headaches, confusion, seizures, double vision, and blurred vision, nausea and vomiting. He has not been monitoring his prep and post prandial blood glucose levels. He denies fatigue, frequent urination, blurred vision, excessive hunger, excessive thirst, weight gain, weight loss, and poor wound healing. He continues to check his feet regularly. He reports chronic pain, which he take Advil for minor relief of pain.  He denies fevers, chills, fatigue, recent infections, weight loss, and night sweats. He has not had any falls. No chest pain, heart palpitations, cough and shortness of breath reported. No reports of GI problems such as diarrhea, and constipation. He has no reports of blood in stools, dysuria and hematuria. No depression or anxiety reported. He denies pain today.   History reviewed. No pertinent surgical history.  Family History  Problem Relation Age of Onset  . Diabetes Mellitus II Neg Hx     Social History   Socioeconomic History  . Marital status: Married    Spouse name: Not on file  . Number of children: Not on file  . Years of education: Not on file  . Highest education level: Not on file  Occupational History  . Not on file  Social Needs   . Financial resource strain: Not on file  . Food insecurity    Worry: Not on file    Inability: Not on file  . Transportation needs    Medical: Not on file    Non-medical: Not on file  Tobacco Use  . Smoking status: Former Research scientist (life sciences)  . Smokeless tobacco: Never Used  Substance and Sexual Activity  . Alcohol use: Yes  . Drug use: No  . Sexual activity: Not on file  Lifestyle  . Physical activity    Days per week: Not on file    Minutes per session: Not on file  . Stress: Not on file  Relationships  . Social Herbalist on phone: Not on file    Gets together: Not on file    Attends religious service: Not on file    Active member of club or organization: Not on file    Attends meetings of clubs or organizations: Not on file    Relationship status: Not on file  . Intimate partner violence    Fear of current or ex partner: Not on file    Emotionally abused: Not on file    Physically abused: Not on file    Forced sexual activity: Not on file  Other Topics Concern  . Not on file  Social History Narrative  . Not on file    Outpatient Medications Prior to Visit  Medication Sig Dispense Refill  . blood glucose meter kit and supplies KIT  Dispense based on patient and insurance preference. Use up to four times daily as directed. (FOR ICD-9 250.00, 250.01). 1 each 0  . lisinopril (ZESTRIL) 5 MG tablet Take 1 tablet (5 mg total) by mouth daily. 30 tablet 3  . metFORMIN (GLUCOPHAGE) 1000 MG tablet Take 1 tablet with meals twice daily. 60 tablet 3  . omeprazole (PRILOSEC) 20 MG capsule Take 1 capsule (20 mg total) by mouth daily. 30 capsule 3  . aspirin 81 MG tablet Take 1 tablet (81 mg total) by mouth daily. (Patient not taking: Reported on 11/24/2018) 30 tablet 5  . cyclobenzaprine (FLEXERIL) 10 MG tablet Take 1 tablet (10 mg total) by mouth 2 (two) times daily as needed for muscle spasms. (Patient not taking: Reported on 11/24/2018) 20 tablet 0  . ondansetron (ZOFRAN) 4 MG tablet  Take 1 tablet (4 mg total) by mouth every 8 (eight) hours as needed for nausea or vomiting. (Patient not taking: Reported on 11/24/2018) 20 tablet 2  . pravastatin (PRAVACHOL) 40 MG tablet Take 1 tablet (40 mg total) by mouth daily. (Patient not taking: Reported on 12/26/2018) 90 tablet 3   No facility-administered medications prior to visit.     No Known Allergies  ROS Review of Systems  Constitutional: Negative.   HENT: Negative.   Eyes: Negative.   Respiratory: Negative.   Cardiovascular: Negative.   Gastrointestinal: Negative.   Genitourinary: Negative.   Musculoskeletal: Negative.   Skin: Negative.   Allergic/Immunologic: Negative.   Neurological: Positive for dizziness (occasional) and headaches (occasional ).  Hematological: Negative.       Objective:    Physical Exam  Constitutional: He is oriented to person, place, and time. He appears well-developed and well-nourished.  HENT:  Head: Normocephalic and atraumatic.  Eyes: Conjunctivae are normal.  Neck: Normal range of motion. Neck supple.  Cardiovascular: Normal rate, regular rhythm, normal heart sounds and intact distal pulses.  Pulmonary/Chest: Effort normal and breath sounds normal.  Abdominal: Soft. Bowel sounds are normal.  Musculoskeletal: Normal range of motion.  Neurological: He is oriented to person, place, and time. He has normal reflexes.  Skin: Skin is warm and dry.  Psychiatric: He has a normal mood and affect. His behavior is normal. Judgment and thought content normal.  Nursing note and vitals reviewed.   BP 122/77 (BP Location: Left Arm, Patient Position: Sitting, Cuff Size: Large)   Pulse 70   Temp 98.1 F (36.7 C) (Oral)   Ht _0  (1.727 m)   Wt 165 lb (74.8 kg)   SpO2 98%   BMI 25.09 kg/m  Wt Readings from Last 3 Encounters:  12/26/18 165 lb (74.8 kg)  11/24/18 168 lb (76.2 kg)  05/23/18 169 lb (76.7 kg)     Health Maintenance Due  Topic Date Due  . PNEUMOCOCCAL POLYSACCHARIDE  VACCINE AGE 32-64 HIGH RISK  09/27/1967  . FOOT EXAM  09/27/1975  . OPHTHALMOLOGY EXAM  09/27/1975  . COLONOSCOPY  09/27/2015  . INFLUENZA VACCINE  12/20/2018    There are no preventive care reminders to display for this patient.  Lab Results  Component Value Date   TSH 2.100 08/09/2017   Lab Results  Component Value Date   WBC 7.4 11/24/2018   HGB 15.4 11/24/2018   HCT 46.0 11/24/2018   MCV 95 11/24/2018   PLT 245 11/24/2018   Lab Results  Component Value Date   NA 141 11/24/2018   K 4.0 11/24/2018   CO2 18 (L) 11/24/2018   GLUCOSE 108 (  H) 11/24/2018   BUN 11 11/24/2018   CREATININE 0.76 11/24/2018   BILITOT 0.5 11/24/2018   ALKPHOS 94 11/24/2018   AST 51 (H) 11/24/2018   ALT 34 11/24/2018   PROT 7.6 11/24/2018   ALBUMIN 4.9 11/24/2018   CALCIUM 9.0 11/24/2018   ANIONGAP 13 05/20/2018   Lab Results  Component Value Date   CHOL 202 (H) 11/24/2018   Lab Results  Component Value Date   HDL 62 11/24/2018   Lab Results  Component Value Date   LDLCALC Comment 11/24/2018   Lab Results  Component Value Date   TRIG 420 (H) 11/24/2018   Lab Results  Component Value Date   CHOLHDL 3.3 11/24/2018   Lab Results  Component Value Date   HGBA1C 6.1 (A) 11/24/2018      Assessment & Plan:   1. Type 2 diabetes mellitus with complication, without long-term current use of insulin ([place]) He will continue to decrease foods/beverages high in sugars and carbs and follow Heart Healthy or DASH diet. Increase physical activity to at least 30 minutes cardio exercise daily.   2. Essential hypertension The current medical regimen is effective; blood pressure is stable at 122/77 today; continue present plan and medications as prescribed. He will continue to decrease high sodium intake, excessive alcohol intake, increase potassium intake, smoking cessation, and increase physical activity of at least 30 minutes of cardio activity daily. He will continue to follow Heart Healthy or  DASH diet.  3. Dyslipidemia - pravastatin (PRAVACHOL) 40 MG tablet; Take 1 tablet (40 mg total) by mouth daily.  Dispense: 90 tablet; Refill: 3  4. Neck pain We will initiate Motrin.  - ibuprofen (ADVIL) 800 MG tablet; Take 1 tablet (800 mg total) by mouth every 8 (eight) hours as needed.  Dispense: 30 tablet; Refill: 3  5. Rash of genital area - hydrocortisone cream 1 %; Apply to affected area 2 times daily  Dispense: 15 g; Refill: 3  6. Follow up She will follow up in 3 months.  - POCT urinalysis dipstick  Meds ordered this encounter  Medications  . pravastatin (PRAVACHOL) 40 MG tablet    Sig: Take 1 tablet (40 mg total) by mouth daily.    Dispense:  90 tablet    Refill:  3  . ibuprofen (ADVIL) 800 MG tablet    Sig: Take 1 tablet (800 mg total) by mouth every 8 (eight) hours as needed.    Dispense:  30 tablet    Refill:  3  . hydrocortisone cream 1 %    Sig: Apply to affected area 2 times daily    Dispense:  15 g    Refill:  3    Orders Placed This Encounter  Procedures  . POCT urinalysis dipstick    Referral Orders  No referral(s) requested today    Kathe Becton,  MSN, FNP-BC [facility] [address] [place], [place] [address] [phone] fax  Problem List Items Addressed This Visit      Cardiovascular and Mediastinum   Essential hypertension   Relevant Medications   pravastatin (PRAVACHOL) 40 MG tablet     Other   Dyslipidemia   Relevant Medications   pravastatin (PRAVACHOL) 40 MG tablet    Other Visit Diagnoses    Type 2 diabetes mellitus with complication, without long-term current use of insulin (HCC)    -  Primary   Relevant Medications   pravastatin (PRAVACHOL) 40 MG  tablet   Neck pain       Relevant Medications   ibuprofen (ADVIL) 800 MG tablet   Rash of genital area       Relevant Medications   hydrocortisone cream 1 %   Follow up       Relevant  Orders   POCT urinalysis dipstick (Completed)      Meds ordered this encounter  Medications  . pravastatin (PRAVACHOL) 40 MG tablet    Sig: Take 1 tablet (40 mg total) by mouth daily.    Dispense:  90 tablet    Refill:  3  . ibuprofen (ADVIL) 800 MG tablet    Sig: Take 1 tablet (800 mg total) by mouth every 8 (eight) hours as needed.    Dispense:  30 tablet    Refill:  3  . hydrocortisone cream 1 %    Sig: Apply to affected area 2 times daily    Dispense:  15 g    Refill:  3    Follow-up: Return in about 3 months (around 03/28/2019).    Azzie Glatter, FNP

## 2019-01-28 MED FILL — PRAVASTATIN NA 40 MG TAB: 40 | 30 days supply | Qty: 30 | Fill #1

## 2019-01-28 MED FILL — IBUPROFEN 800 MG TABLET: 800 | 10 days supply | Qty: 30 | Fill #1

## 2019-01-28 MED FILL — LISINOPRIL 5 MG TABLET: 5 | 30 days supply | Qty: 30 | Fill #2

## 2019-01-28 MED FILL — metFORMIN HCL 1000 MG TABS: 1000 | 30 days supply | Qty: 60 | Fill #2

## 2019-02-16 IMAGING — DX DG CHEST 2V
2 series · 2 of 2 positions shown · non-contrast
Comparison: None.

CLINICAL DATA: Acute onset of left-sided burning chest pain.
Dizziness. Initial encounter.

EXAM:
CHEST  2 VIEW

[chest pa]
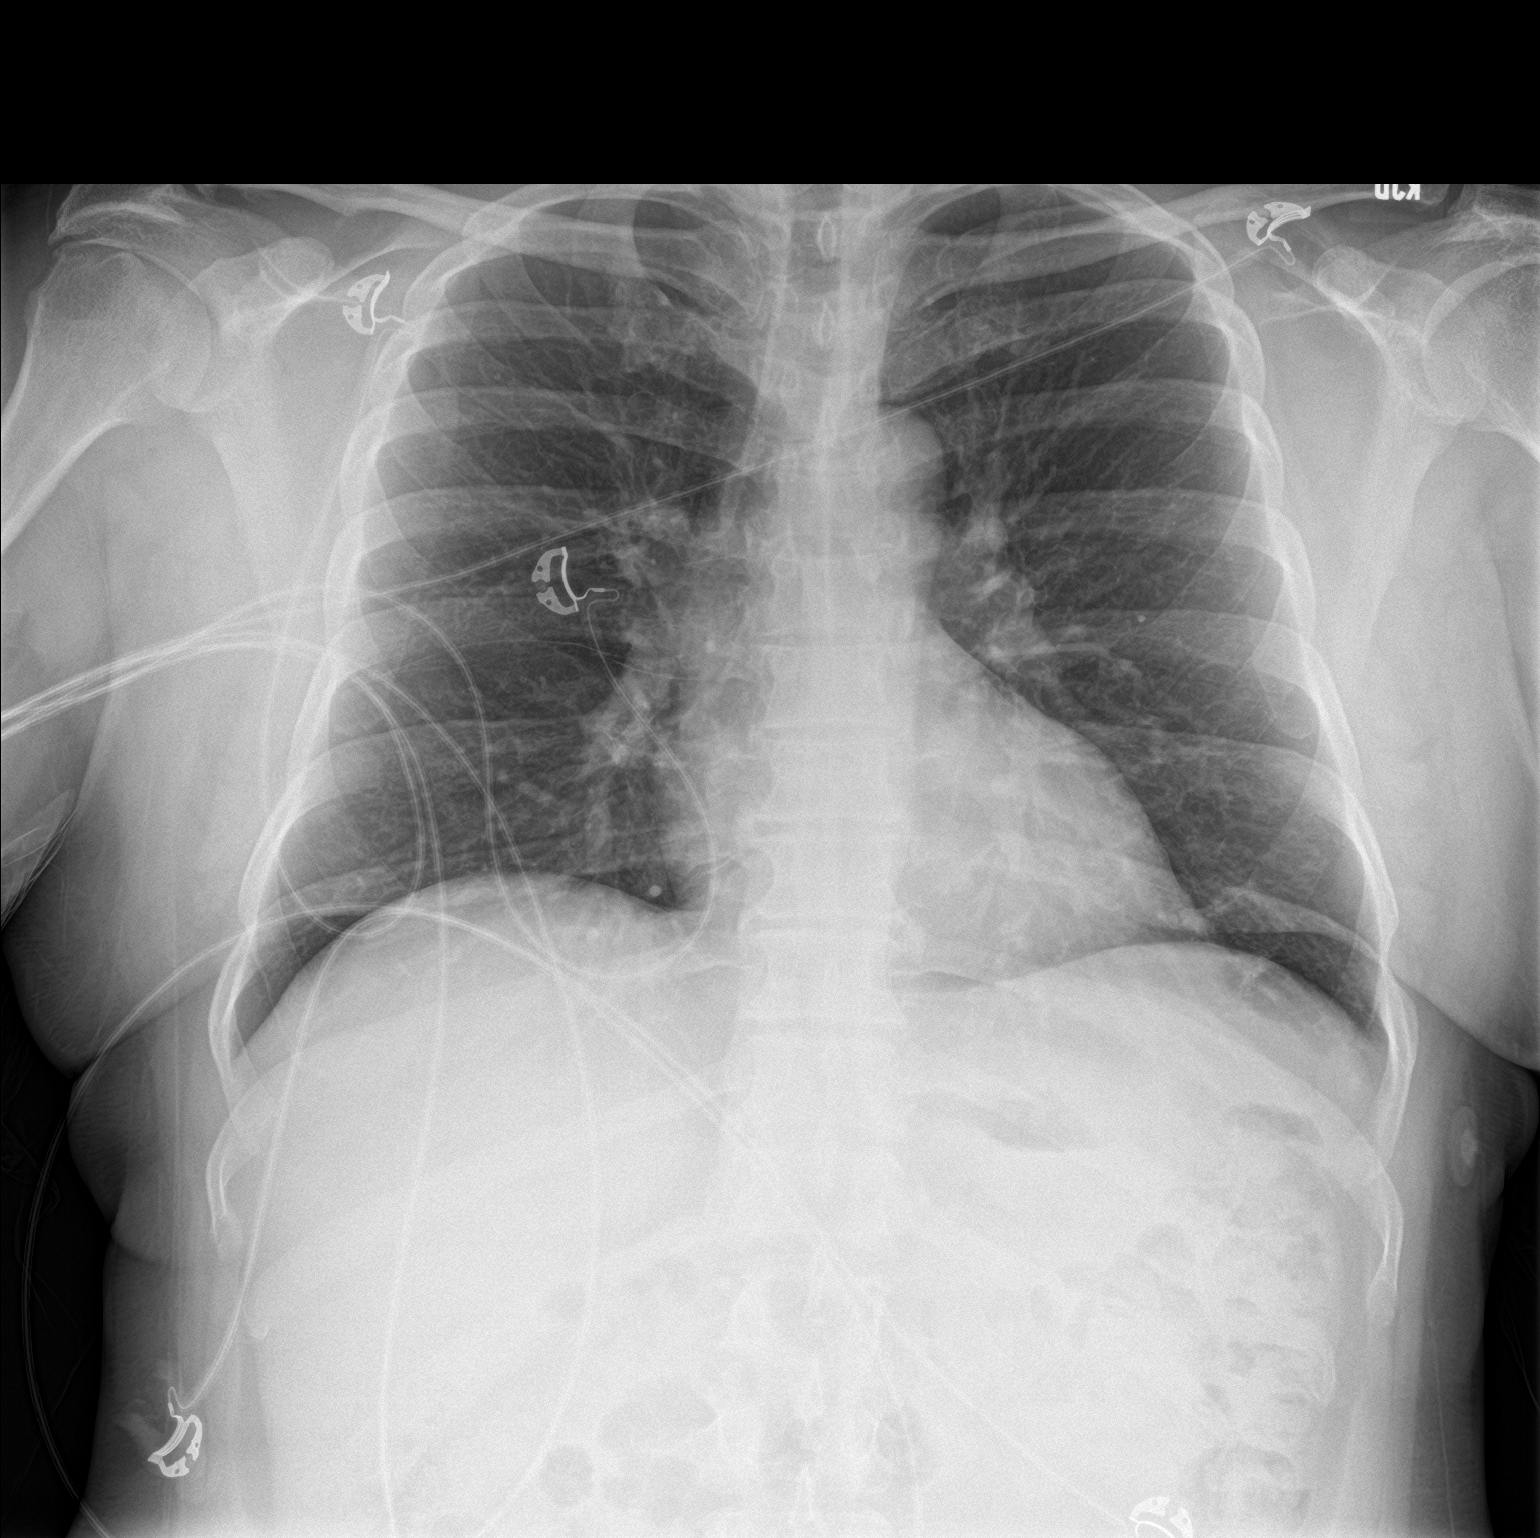

[chest lat]
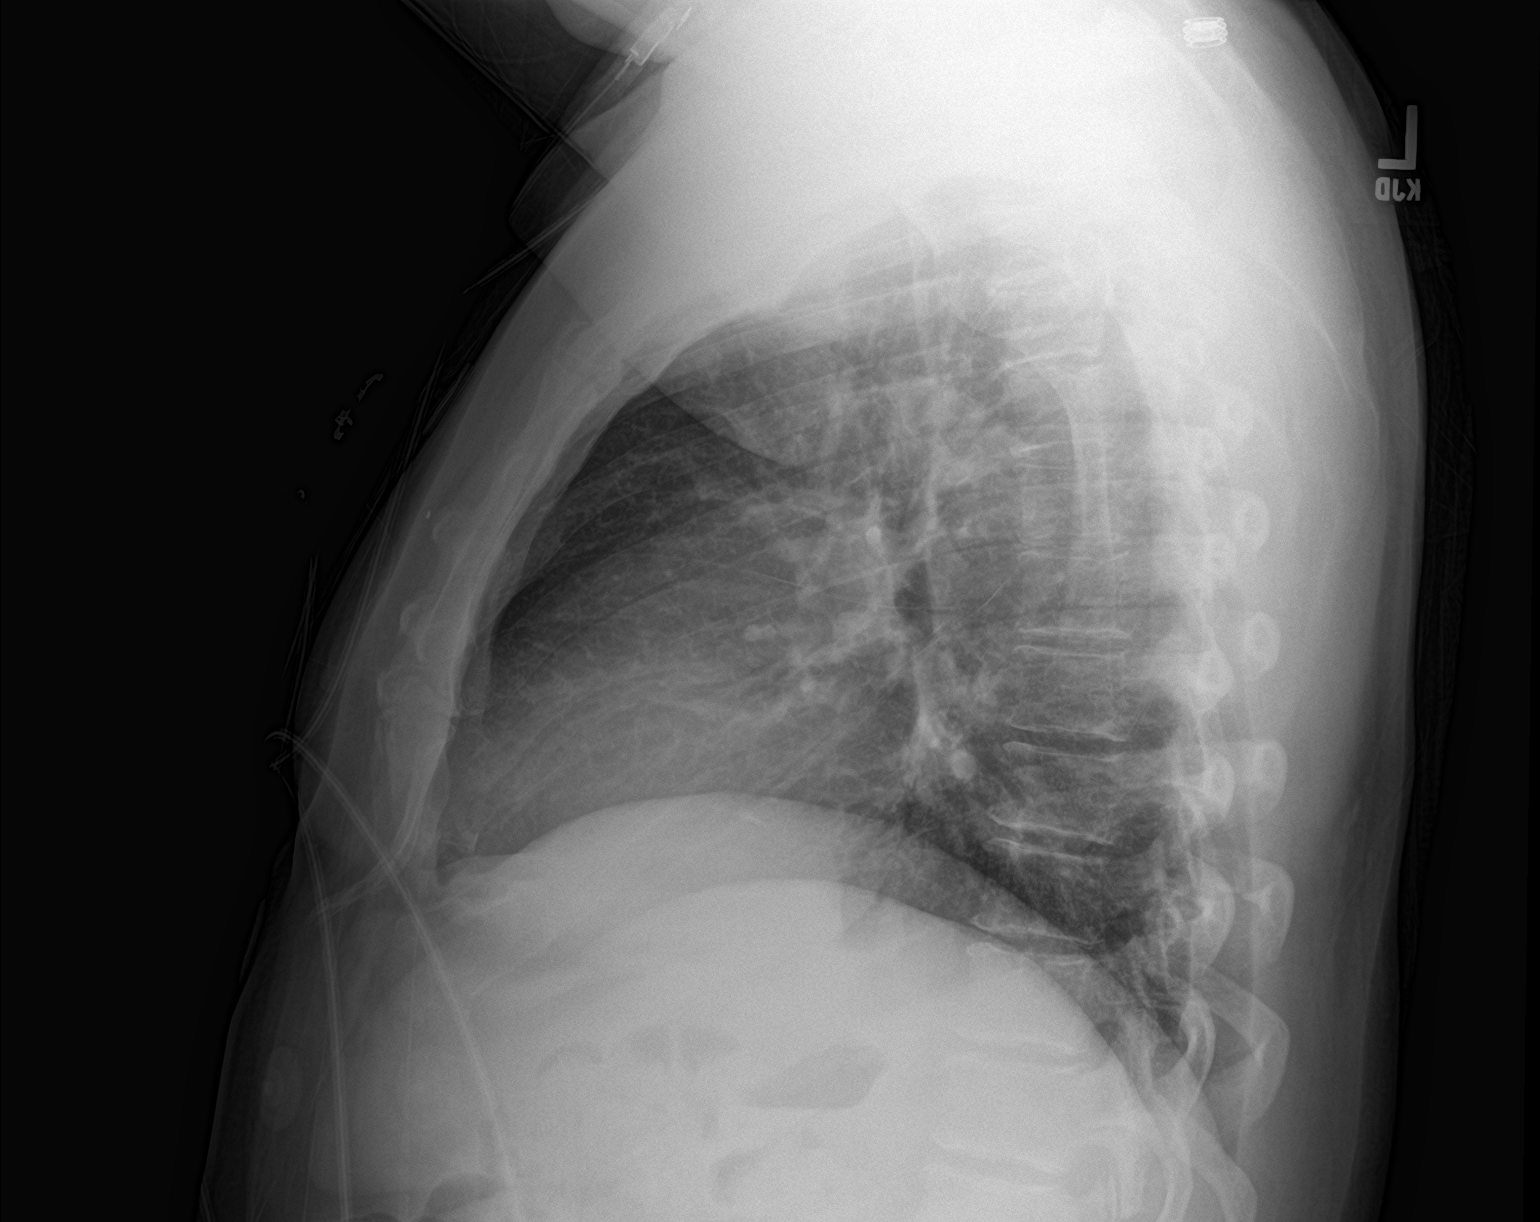

[2 of 2 positions shown; findings below may reference images not displayed]

FINDINGS: The lungs are well-aerated and clear. There is no evidence of focal
opacification, pleural effusion or pneumothorax.

The heart is normal in size; the mediastinal contour is within
normal limits. No acute osseous abnormalities are seen.
IMPRESSION: No acute cardiopulmonary process seen.

## 2019-03-05 MED FILL — PRAVASTATIN NA 40 MG TAB: 40 | 30 days supply | Qty: 30 | Fill #2

## 2019-03-05 MED FILL — LISINOPRIL 5 MG TABLET: 5 | 30 days supply | Qty: 30 | Fill #3

## 2019-03-30 ENCOUNTER — Ambulatory Visit (INDEPENDENT_AMBULATORY_CARE_PROVIDER_SITE_OTHER): Payer: Self-pay | Admitting: Family Medicine

## 2019-03-30 ENCOUNTER — Other Ambulatory Visit: Payer: Self-pay

## 2019-03-30 ENCOUNTER — Encounter: Payer: Self-pay | Admitting: Family Medicine

## 2019-03-30 VITALS — BP 117/76 | HR 67 | Temp 97.7°F | Ht 68.0 in | Wt 168.6 lb

## 2019-03-30 DIAGNOSIS — Z09 Encounter for follow-up examination after completed treatment for conditions other than malignant neoplasm: Secondary | ICD-10-CM

## 2019-03-30 DIAGNOSIS — M542 Cervicalgia: Secondary | ICD-10-CM

## 2019-03-30 DIAGNOSIS — G8929 Other chronic pain: Secondary | ICD-10-CM

## 2019-03-30 DIAGNOSIS — T148XXA Other injury of unspecified body region, initial encounter: Secondary | ICD-10-CM

## 2019-03-30 DIAGNOSIS — R1084 Generalized abdominal pain: Secondary | ICD-10-CM

## 2019-03-30 DIAGNOSIS — I10 Essential (primary) hypertension: Secondary | ICD-10-CM

## 2019-03-30 DIAGNOSIS — E118 Type 2 diabetes mellitus with unspecified complications: Secondary | ICD-10-CM

## 2019-03-30 LAB — POCT URINALYSIS DIPSTICK
Bilirubin, UA: NEGATIVE
Blood, UA: NEGATIVE
Glucose, UA: NEGATIVE
Ketones, UA: NEGATIVE
Leukocytes, UA: NEGATIVE
Nitrite, UA: NEGATIVE
Protein, UA: NEGATIVE
Spec Grav, UA: >=1.03 — AB (ref 1.010–1.025)
Urobilinogen, UA: 0.2 E.U./dL
pH, UA: 6 (ref 5.0–8.0)

## 2019-03-30 MED ORDER — DICLOFENAC SODIUM 1 % TD GEL: 4.0000 g | Freq: Four times a day (QID) | TRANSDERMAL | 3 refills | Status: DC

## 2019-03-30 NOTE — Patient Instructions (Signed)
Diclofenac sodium topical solution Qu es este medicamento? El DICLOFENACO es un medicamento antiinflamatorio no esteroideo (AINE). Se utiliza para tratar la osteoartritis de las rodillas. Este medicamento puede ser utilizado para otros usos; si tiene alguna pregunta consulte con su proveedor de atencin mdica o con su farmacutico. MARCAS COMUNES: DICLOVIX, INFLAMMA-K, PENNSAID, VOPAC MDS Qu le debo informar a mi profesional de la salud antes de tomar este medicamento? Necesita saber si usted presenta alguno de los siguientes problemas o situaciones: asma problemas sanguneas ciruga de bypass coronario con injerto en las ltimas 2 semanas enfermedad cardiaca alta presin sangunea si consume bebidas alcohlicas con frecuencia enfermedad renal enfermedad heptica piel abierta o infectada problemas estomacales una reaccin alrgica o inusual al diclofenaco, a la aspirina, otros [place], a otros medicamentos, alimentos, colorantes o conservantes si est embarazada o buscando quedar embarazada si est amamantando a un beb Cmo debo [employer]? Este medicamento es solo para uso externo. Siga las instrucciones de la etiqueta del [place]. Lvese las manos antes y despus de usarlo. Aplicar sobre la piel limpia y seca de la rodilla. Frotar alrededor del frente, la parte posterior y los laterales de la rodilla. No aplicar sobre heridas abiertas, infecciones, hinchazn, o reas de dermatitis exfoliativa. Deje que el medicamento se seque antes de usar otra locin o Office manager. Evite que el medicamento entre en la boca o los ojos. Si el medicamento [employer]ands ojos, enjuague con abundante agua fra del grifo. Proteja el rea de tratamiento de la luz solar y Materials engineer. Utilice sus dosis a intervalos regulares. No lo use con una frecuencia mayor a la indicada. Su farmacutico le dar una Gua del medicamento especial (MedGuide, nombre en ingls) con cada receta y en  cada ocasin que la vuelva a surtir. Asegrese de leer esta informacin cada vez cuidadosamente. Hable con su pediatra para informarse acerca del uso de este medicamento en nios. Puede requerir atencin especial. Sobredosis: Pngase en contacto inmediatamente con un centro toxicolgico o una sala de urgencia si usted cree que haya tomado demasiado medicamento. ATENCIN: [employer] Foods es solo para usted. No comparta este medicamento con nadie. Qu sucede si me olvido de una dosis? Si olvida una dosis, aplquela lo antes posible. Si es casi la hora de la prxima dosis, aplique slo esa dosis. No use dosis adicionales o dobles. Qu puede interactuar con este medicamento? No tome esta medicina con ninguno de los siguientes medicamentos: cidofovir quetorolac metotrexato Esta medicina tambin puede interactuar con los siguientes medicamentos: aspirina ciclosporina litio medicamentos para la presin sangunea medicamentos que tratan o previenen cogulos sanguneos, como la warfarina, enoxaparina y dalteparina los [place], medicamentos para Conservation officer, historic buildings o inflamacin, como ibuprofeno o naproxeno otros productos para la piel medicamentos esteroideos, como la prednisona o la cortisona Puede ser que esta lista no menciona todas las posibles interacciones. Informe a su profesional de KB Home	[place] de [employer] Corporation productos a base de hierbas, medicamentos de [place] o suplementos nutritivos que est tomando. Si usted fuma, consume bebidas alcohlicas o si utiliza drogas ilegales, indqueselo tambin a su profesional de KB Home	[place]. Algunas sustancias pueden interactuar con su medicamento. A qu debo estar atento al usar Coca-Cola? Si los sntomas no comienzan a mejorar o si empeoran, consulte con su mdico o con su profesional de KB Home	[place]. Este medicamento puede aumentar la sensibilidad al sol. Mantngase fuera de Administrator. Si no lo puede evitar, utilice ropa protectora y crema de Photographer. No utilice  lmparas solares, camas solares ni cabinas solares. Evite tomar medicamentos, tales como ibuprofeno y naproxeno con Industrial/product designereste medicamento. Es probable que se Special educational needs teacherproduzcan efectos secundarios, tales como molestias estomacales, nuseas o lceras. No debe de tomar PPL Corporationeste medicamento con muchos medicamentos disponibles de H. J. Heinzventa libre. Este medicamento no previene ataques cardiacos o derrames cerebrales. De hecho, este medicamento puede aumentar la posibilidad de Marine scientistpadecer un ataque cardiaco o un derrame cerebral. La posibilidad puede aumentar con el uso prolongado de este medicamento y en pacientes con enfermedad cardiaca. Si est tomando aspirina para la prevencin de ataques cardiacos o derrames cerebrales, comunquese con su mdico o su profesional de Beazer Homesla salud. Este medicamento puede provocar lceras y hemorragia del estmago e intestinos en cualquier momento durante tratamiento. Pueden ocurrir lceras y hemorragia sin sntomas de Control and instrumentation engineeralerta y Financial controllerpueden provocar la [place] Republicmuerte. Para reducir su riesgo, no fume ni ingiera alcohol mientras [place]ausa este medicamento. Este medicamento puede hacerle sangrar con mayor facilidad. Trate de no lastimarse los dientes y las encas al cepillarlos o limpiarlos con hilo dental. Qu efectos secundarios puedo tener al Boston Scientificutilizar este medicamento? Efectos secundarios que debe informar a su mdico o a Producer, television/film/videosu profesional de la salud tan pronto como sea posible: Therapist, artreacciones alrgicas, como erupcin cutnea, picazn o urticarias, e hinchazn de la cara, los labios o la lengua heces con sangre o de color negro, sangre en la orina o vmito visin borrosa dolor en el pecho dificultad al respirar o sibilancias nuseas o vmito enrojecimiento, formacin de ampollas, descamacin o distensin de la piel, incluso dentro de la boca mala articulacin al hablar o debilidad en un lado del cuerpo dificultad para orinar o cambios en el volumen de orina aumento de peso o hinchazn inexplicables cansancio o debilidad inusual color  amarillento de los ojos o la piel Efectos secundarios que generalmente no requieren atencin mdica (infrmelos a su mdico o a Producer, television/film/videosu profesional de la salud si persisten o si son molestos): mareos piel seca dolor de Training and development officercabeza mayor sensibilidad al sol malestar estomacal hormigueo en la aplicacin Puede ser que esta lista no menciona todos los posibles efectos secundarios. Comunquese a su mdico por asesoramiento mdico Hewlett-Packardsobre los efectos secundarios. Usted puede informar los efectos secundarios a la FDA por telfono al 1-800-FDA-1088. Dnde debo guardar mi medicina? Mantngala fuera del alcance de los nios. Gurdela a Sanmina-SCItemperatura ambiente, entre 15 y 30 grados C (4059 y 1886 grados F). Mantenga el envase bien cerrado. Deseche todo el medicamento que no haya utilizado, despus de la fecha de vencimiento. ATENCIN: Este folleto es un resumen. Puede ser que no cubra toda la posible informacin. Si usted tiene preguntas acerca de esta medicina, consulte con su mdico, su farmacutico o su profesional de Radiographer, therapeuticla salud.  2020 Elsevier/Gold Standard (2016-06-07 00:00:00)   Distensin muscular Muscle Strain Una distensin muscular es una lesin que se produce cuando un msculo se estira ms all de su largo normal. Puede ocurrir durante cadas, mientras se practican deportes o se levantan objetos. A causa de una distensin, pueden rasgarse las fibras musculares. En general, la recuperacin de una distensin muscular tarda de 1 a 2semanas. La recuperacin completa normalmente tarda de 5 a 6semanas. Inicialmente, se trata con terapia PRICE (proteccin, reposo, hielo, compresin, elevacin). Esto implica:  Proteger al msculo para que no sufra nuevas lesiones.  Dejar en reposo el msculo lesionado.  Aplicar hielo en el msculo lesionado.  Aplicar presin (compresin) en el msculo lesionado. Esto se puede hacer con una frula o una venda elstica.  Levantar (elevar)  el msculo lesionado. Adems, el mdico puede  recomendarle analgsicos. Sigue estas instrucciones en tu casa: Si tiene una frula:  Use la frula como se lo haya indicado el mdico. Quteselo solamente como se lo haya indicado el mdico.  Afloje la frula si los dedos de la mano o de los pies se le adormecen, si siente hormigueo o si se le enfran y se tornan de Research officer, trade union.  Mantenga la frula limpia.  Si la frula no es impermeable: ? No deje que se moje. ? Cbralos con un envoltorio hermtico cuando tome un bao de inmersin o [place]. Control del dolor, el entumecimiento y la hinchazn   Si se lo indican, aplique hielo en el lugar de la lesin. ? Si tiene una frula desmontable, qutesela como se lo haya indicado el mdico. ? Ponga el hielo en una bolsa plstica. ? Coloque una FirstEnergy Corp piel y Copy. ? Coloque el hielo durante , 2 a 3veces por da.  Mueva los dedos de las manos o de los pies con Psychologist, clinical. Esto ayuda a Multimedia programmer entumecimiento y Personnel officer.  Cuando est sentado o acostado, mantenga el lugar lesionado por encima del nivel del corazn.  Use una venda elstica segn las indicaciones del mdico. Asegrese de no ajustarla demasiado. Indicaciones generales  [employer] International de venta libre y los recetados solamente como se lo haya indicado el mdico.  Limite las [place]. Deje en reposo el msculo lesionado como se lo haya indicado el mdico. El mdico puede decirle que los movimientos suaves no representan un problema.  Si le indicaron fisioterapia, haga los ejercicios como se lo haya indicado el mdico.  No ejerza presin en ninguna parte de la frula hasta que se haya endurecido por completo. Esto puede tardar muchas horas.  No consuma ningn producto que contenga nicotina o tabaco, como cigarrillos y Administrator, Civil Service. Estos pueden retrasar la consolidacin del [place]. Si necesita ayuda para dejar de consumir, consulte al [employer] Express.  Entre en calor antes de  hacer ejercicio. Esto ayuda a prevenir futuras distensiones musculares.  Consulte al mdico cundo puede volver a conducir si tiene una frula.  Concurra a todas las visitas de [address] se lo haya indicado el mdico. Esto es importante. Comunquese con un mdico si:  Siente ms dolor o tiene ms hinchazn en el lugar de la lesin. Solicite ayuda inmediatamente si:  Si tiene alguno de Limited Brands en el lugar de la lesin: ? Siente entumecimiento. ? Siente hormigueos. ? Pierde mucha fuerza. Resumen  Una distensin muscular es una lesin que se produce cuando un msculo se estira ms all de su largo normal.  Inicialmente, se trata con terapia PRICE (proteccin, reposo, hielo, compresin, elevacin). Esto implica proteger la lesin, aplicar hielo y presin, levantar el rea y dejarla en reposo.  Limite las actividades. Deje en reposo el msculo lesionado como se lo haya indicado el mdico. El mdico puede decirle que los movimientos suaves no representan un problema.  Entre en calor antes de hacer ejercicio. Esto ayuda a prevenir futuras distensiones musculares. Esta informacin no tiene Theme park manager el consejo del mdico. Asegrese de hacerle al mdico cualquier pregunta que tenga. Document Released: 08/03/2008 Document Revised: 07/09/2018 Document Reviewed: 07/09/2018 Elsevier Patient Education  2020 [employer].

## 2019-03-30 NOTE — Progress Notes (Signed)
[facility]    Established Patient Office Visit  Subjective:  Patient ID: Derek Cruz, male    DOB: [birth year]  Age: 53 y.o. MRN: [id number]  CC:  Chief Complaint  Patient presents with  . Follow-up    Medication management  . Abdominal Pain    onset last night- one episode of vomiting  . interpreter    [phone]    HPI Derek Cruz is a 53 year old male who presents for Follow Up today.   Past Medical History:  Diagnosis Date  . Chest pain   . Dyslipidemia   . Gastritis   . Hypertension    Current Status: Since his last office visit, he is doing well with no complaints. We will use Hispanic Interpreter today. He continues to have chronic right neck pain, which he takes Ibuprofen for relief. He denies visual changes, chest pain, cough, shortness of breath, heart palpitations, and falls. He has occasional headaches and dizziness with position changes. Denies severe headaches, confusion, seizures, double vision, and blurred vision, nausea and vomiting. He denies fevers, chills, fatigue, recent infections, weight loss, and night sweats. No reports of GI problems such as diarrhea, and constipation. He has no reports of blood in stools, dysuria and hematuria. No depression or anxiety reported today.    History reviewed. No pertinent surgical history.  Family History  Problem Relation Age of Onset  . Diabetes Mellitus II Neg Hx     Social History   Socioeconomic History  . Marital status: Married    Spouse name: Not on file  . Number of children: Not on file  . Years of education: Not on file  . Highest education level: Not on file  Occupational History  . Not on file  Social Needs  . Financial resource strain: Not on file  . Food insecurity    Worry: Not on file    Inability: Not on file  . Transportation needs    Medical: Not on file    Non-medical: Not on file  Tobacco Use  . Smoking  status: Former Research scientist (life sciences)  . Smokeless tobacco: Never Used  Substance and Sexual Activity  . Alcohol use: Yes  . Drug use: No  . Sexual activity: Not on file  Lifestyle  . Physical activity    Days per week: Not on file    Minutes per session: Not on file  . Stress: Not on file  Relationships  . Social Herbalist on phone: Not on file    Gets together: Not on file    Attends religious service: Not on file    Active member of club or organization: Not on file    Attends meetings of clubs or organizations: Not on file    Relationship status: Not on file  . Intimate partner violence    Fear of current or ex partner: Not on file    Emotionally abused: Not on file    Physically abused: Not on file    Forced sexual activity: Not on file  Other Topics Concern  . Not on file  Social History Narrative  . Not on file    Outpatient Medications Prior to Visit  Medication Sig Dispense Refill  . ibuprofen (ADVIL) 800 MG tablet Take 1 tablet (800 mg total) by mouth every 8 (eight) hours as needed. 30 tablet 3  . lisinopril (ZESTRIL) 5 MG tablet Take 1 tablet (5 mg total) by mouth  daily. 30 tablet 3  . metFORMIN (GLUCOPHAGE) 1000 MG tablet Take 1 tablet with meals twice daily. 60 tablet 3  . pravastatin (PRAVACHOL) 40 MG tablet Take 1 tablet (40 mg total) by mouth daily. 90 tablet 3  . aspirin 81 MG tablet Take 1 tablet (81 mg total) by mouth daily. (Patient not taking: Reported on 11/24/2018) 30 tablet 5  . blood glucose meter kit and supplies KIT Dispense based on patient and insurance preference. Use up to four times daily as directed. (FOR ICD-9 250.00, 250.01). (Patient not taking: Reported on 03/30/2019) 1 each 0  . hydrocortisone cream 1 % Apply to affected area 2 times daily (Patient not taking: Reported on 03/30/2019) 15 g 3  . omeprazole (PRILOSEC) 20 MG capsule Take 1 capsule (20 mg total) by mouth daily. (Patient not taking: Reported on 03/30/2019) 30 capsule 3   No  facility-administered medications prior to visit.     No Known Allergies  ROS Review of Systems  Constitutional: Negative.   HENT: Negative.   Eyes: Negative.   Respiratory: Negative.   Cardiovascular: Negative.   Gastrointestinal: Negative.   Endocrine: Negative.   Genitourinary: Negative.   Musculoskeletal: Positive for neck pain.  Skin: Negative.   Allergic/Immunologic: Negative.   Neurological: Negative.   Hematological: Negative.   Psychiatric/Behavioral: Negative.    Objective:    Physical Exam  Constitutional: He is oriented to person, place, and time. He appears well-developed and well-nourished.  HENT:  Head: Normocephalic.  Eyes: Conjunctivae are normal.  Neck: Normal range of motion. Neck supple.  Cardiovascular: Normal rate, regular rhythm, normal heart sounds and intact distal pulses.  Pulmonary/Chest: Effort normal and breath sounds normal.  Abdominal: Soft. Bowel sounds are normal.  Musculoskeletal:     Comments: Neck muscle strain  Neurological: He is alert and oriented to person, place, and time. He has normal reflexes.  Skin: Skin is warm and dry.  Psychiatric: He has a normal mood and affect. His behavior is normal. Judgment and thought content normal.  Nursing note and vitals reviewed.   BP 117/76   Pulse 67   Temp 97.7 F (36.5 C) (Oral)   Ht 5' 8"  (1.727 m)   Wt 168 lb 9.6 oz (76.5 kg)   SpO2 99%   BMI 25.64 kg/m  Wt Readings from Last 3 Encounters:  03/30/19 168 lb 9.6 oz (76.5 kg)  12/26/18 165 lb (74.8 kg)  11/24/18 168 lb (76.2 kg)     Health Maintenance Due  Topic Date Due  . PNEUMOCOCCAL POLYSACCHARIDE VACCINE AGE 58-64 HIGH RISK  09/27/1967  . FOOT EXAM  09/27/1975  . OPHTHALMOLOGY EXAM  09/27/1975  . COLONOSCOPY  09/27/2015  . INFLUENZA VACCINE  12/20/2018    There are no preventive care reminders to display for this patient.  Lab Results  Component Value Date   TSH 2.100 08/09/2017   Lab Results  Component Value  Date   WBC 7.4 11/24/2018   HGB 15.4 11/24/2018   HCT 46.0 11/24/2018   MCV 95 11/24/2018   PLT 245 11/24/2018   Lab Results  Component Value Date   NA 141 11/24/2018   K 4.0 11/24/2018   CO2 18 (L) 11/24/2018   GLUCOSE 108 (H) 11/24/2018   BUN 11 11/24/2018   CREATININE 0.76 11/24/2018   BILITOT 0.5 11/24/2018   ALKPHOS 94 11/24/2018   AST 51 (H) 11/24/2018   ALT 34 11/24/2018   PROT 7.6 11/24/2018   ALBUMIN 4.9 11/24/2018   CALCIUM  9.0 11/24/2018   ANIONGAP 13 05/20/2018   Lab Results  Component Value Date   CHOL 202 (H) 11/24/2018   Lab Results  Component Value Date   HDL 62 11/24/2018   Lab Results  Component Value Date   LDLCALC Comment 11/24/2018   Lab Results  Component Value Date   TRIG 420 (H) 11/24/2018   Lab Results  Component Value Date   CHOLHDL 3.3 11/24/2018   Lab Results  Component Value Date   HGBA1C 6.1 (A) 11/24/2018      Assessment & Plan:   1. Essential hypertension The current medical regimen is effective; blood pressure is stable at 117/76 today; continue present plan and medications as prescribed. He will continue to take medications as prescribed, to decrease high sodium intake, excessive alcohol intake, increase potassium intake, smoking cessation, and increase physical activity of at least 30 minutes of cardio activity daily. He will continue to follow Heart Healthy or DASH diet. - Urinalysis Dipstick  2. Type 2 diabetes mellitus with complication, without long-term current use of insulin (HCC) The current medical regimen is effective; most recent Hgb A1c is stable at 5.8 today; continue present plan and medications as prescribed. He will continue medication as prescribed, to decrease foods/beverages high in sugars and carbs and follow Heart Healthy or DASH diet. Increase physical activity to at least 30 minutes cardio exercise daily.   3. Neck pain We will initiate gel for muscle strain today.  - diclofenac sodium (VOLTAREN) 1 %  GEL; Apply 4 g topically 4 (four) times daily.  Dispense: 4 g; Refill: 3  4. Muscle strain Moderate pain. He will continue Motrin as needed. Advised patient to discontinue sleeping on 2 pillows to reduce changes of straining neck muscles. He will also use RICE techniques if needed to aide in pain reducing pain.  - diclofenac sodium (VOLTAREN) 1 % GEL; Apply 4 g topically 4 (four) times daily.  Dispense: 4 g; Refill: 3  5. Generalized abdominal pain Probable r/t Metformin and Motrin use. Advised patient to take these meds with food in the futher.   6. Follow up He will follow up in 3 months.   Meds ordered this encounter  Medications  . diclofenac sodium (VOLTAREN) 1 % GEL    Sig: Apply 4 g topically 4 (four) times daily.    Dispense:  4 g    Refill:  3    Orders Placed This Encounter  Procedures  . Urinalysis Dipstick   Referral Orders  No referral(s) requested today    Kathe Becton,  MSN, FNP-BC [facility] [address] [place], [place] [address] [phone] fax    Problem List Items Addressed This Visit      Cardiovascular and Mediastinum   Essential hypertension - Primary   Relevant Orders   Urinalysis Dipstick (Completed)    Other Visit Diagnoses    Type 2 diabetes mellitus with complication, without long-term current use of insulin (HCC)       Neck pain       Relevant Medications   diclofenac sodium (VOLTAREN) 1 % GEL   Muscle strain       Relevant Medications   diclofenac sodium (VOLTAREN) 1 % GEL   Generalized abdominal pain       Follow up          Meds ordered this encounter  Medications  . diclofenac sodium (VOLTAREN) 1 % GEL  Sig: Apply 4 g topically 4 (four) times daily.    Dispense:  4 g    Refill:  3    Follow-up: Return in about 3 months (around 06/30/2019).    Azzie Glatter, FNP

## 2019-03-31 DIAGNOSIS — T148XXA Other injury of unspecified body region, initial encounter: Secondary | ICD-10-CM | POA: Insufficient documentation

## 2019-03-31 DIAGNOSIS — R1084 Generalized abdominal pain: Secondary | ICD-10-CM | POA: Insufficient documentation

## 2019-03-31 DIAGNOSIS — R109 Unspecified abdominal pain: Secondary | ICD-10-CM | POA: Insufficient documentation

## 2019-03-31 DIAGNOSIS — M542 Cervicalgia: Secondary | ICD-10-CM | POA: Insufficient documentation

## 2019-03-31 MED FILL — DICLOFENAC SODIUM 1% GEL: 1 | 6 days supply | Qty: 100 | Fill #0

## 2019-05-08 MED FILL — PRAVASTATIN NA 40 MG TAB: 40 | 30 days supply | Qty: 30 | Fill #3

## 2019-06-30 ENCOUNTER — Ambulatory Visit: Payer: Self-pay | Admitting: Family Medicine

## 2019-07-13 ENCOUNTER — Other Ambulatory Visit: Payer: Self-pay

## 2019-07-13 ENCOUNTER — Encounter: Payer: Self-pay | Admitting: Family Medicine

## 2019-07-13 ENCOUNTER — Ambulatory Visit (INDEPENDENT_AMBULATORY_CARE_PROVIDER_SITE_OTHER): Payer: Self-pay | Admitting: Family Medicine

## 2019-07-13 VITALS — BP 136/89 | HR 75 | Temp 98.3°F | Ht 68.0 in | Wt 184.4 lb

## 2019-07-13 DIAGNOSIS — I10 Essential (primary) hypertension: Secondary | ICD-10-CM

## 2019-07-13 DIAGNOSIS — Z09 Encounter for follow-up examination after completed treatment for conditions other than malignant neoplasm: Secondary | ICD-10-CM

## 2019-07-13 DIAGNOSIS — R7303 Prediabetes: Secondary | ICD-10-CM

## 2019-07-13 DIAGNOSIS — R7309 Other abnormal glucose: Secondary | ICD-10-CM

## 2019-07-13 DIAGNOSIS — E785 Hyperlipidemia, unspecified: Secondary | ICD-10-CM

## 2019-07-13 DIAGNOSIS — Z0000 Encounter for general adult medical examination without abnormal findings: Secondary | ICD-10-CM

## 2019-07-13 LAB — POCT GLYCOSYLATED HEMOGLOBIN (HGB A1C): Hemoglobin A1C: 6.7 % — AB (ref 4.0–5.6)

## 2019-07-13 LAB — GLUCOSE, POCT (MANUAL RESULT ENTRY): POC Glucose: 193 mg/dl — AB (ref 70–99)

## 2019-07-13 NOTE — Progress Notes (Signed)
[facility]    Established Patient Office Visit  Subjective:  Patient ID: Derek Cruz, male    DOB: Nov 05, 1965  Age: 54 y.o. MRN: [id number]  CC:  Chief Complaint  Patient presents with  . Follow-up    DM    HPI [name] is a 54 year old male who presents for Follow Up today.   Past Medical History:  Diagnosis Date  . Chest pain   . Dyslipidemia   . Gastritis   . Hypertension    Patient Active Problem List   Diagnosis Date Noted  . Neck pain 03/31/2019  . Muscle strain 03/31/2019  . Generalized abdominal pain 03/31/2019  . Gastroesophageal reflux disease without esophagitis 11/24/2018  . Chest discomfort 11/24/2018  . Essential hypertension 11/24/2018  . Pre-diabetes 11/24/2018  . Dyslipidemia 10/06/2016  . Chest pain 09/26/2016  . Diabetes mellitus type 2, controlled ([place]) 09/26/2016   Current Status: Since his last office visit, he is doing well with no complaints. He denies visual changes, chest pain, cough, shortness of breath, heart palpitations, and falls. He has occasional headaches and dizziness with position changes. Denies severe headaches, confusion, seizures, double vision, and blurred vision, nausea and vomiting. He denies fatigue, frequent urination, blurred vision, excessive hunger, excessive thirst, weight gain, weight loss, and poor wound healing. He continues to check his feet regularly. He denies fevers, chills, recent infections, weight loss, and night sweats. No reports of GI problems such as diarrhea, and constipation. He has no reports of blood in stools, dysuria and hematuria. No depression or anxiety reported today. He denies suicidal ideations, homicidal ideations, or auditory hallucinations. He denies pain today.   History reviewed. No pertinent surgical history.  Family History  Problem Relation Age of Onset  . Diabetes Mellitus II Neg Hx     Social History    Socioeconomic History  . Marital status: Married    Spouse name: Not on file  . Number of children: Not on file  . Years of education: Not on file  . Highest education level: Not on file  Occupational History  . Not on file  Tobacco Use  . Smoking status: Former Research scientist (life sciences)  . Smokeless tobacco: Never Used  Substance and Sexual Activity  . Alcohol use: Yes  . Drug use: No  . Sexual activity: Not on file  Other Topics Concern  . Not on file  Social History Narrative  . Not on file   Social Determinants of Health   Financial Resource Strain:   . Difficulty of Paying Living Expenses: Not on file  Food Insecurity:   . Worried About Charity fundraiser in the Last Year: Not on file  . Ran Out of Food in the Last Year: Not on file  Transportation Needs:   . Lack of Transportation (Medical): Not on file  . Lack of Transportation (Non-Medical): Not on file  Physical Activity:   . Days of Exercise per Week: Not on file  . Minutes of Exercise per Session: Not on file  Stress:   . Feeling of Stress : Not on file  Social Connections:   . Frequency of Communication with Friends and Family: Not on file  . Frequency of Social Gatherings with Friends and Family: Not on file  . Attends Religious Services: Not on file  . Active Member of Clubs or Organizations: Not on file  . Attends Archivist Meetings: Not on file  . Marital  Status: Not on file  Intimate Partner Violence:   . Fear of Current or Ex-Partner: Not on file  . Emotionally Abused: Not on file  . Physically Abused: Not on file  . Sexually Abused: Not on file    Outpatient Medications Prior to Visit  Medication Sig Dispense Refill  . diclofenac sodium (VOLTAREN) 1 % GEL Apply 4 g topically 4 (four) times daily. 4 g 3  . ibuprofen (ADVIL) 800 MG tablet Take 1 tablet (800 mg total) by mouth every 8 (eight) hours as needed. 30 tablet 3  . lisinopril (ZESTRIL) 5 MG tablet Take 1 tablet (5 mg total) by mouth daily. 30  tablet 3  . metFORMIN (GLUCOPHAGE) 1000 MG tablet Take 1 tablet with meals twice daily. 60 tablet 3  . omeprazole (PRILOSEC) 20 MG capsule Take 1 capsule (20 mg total) by mouth daily. 30 capsule 3  . pravastatin (PRAVACHOL) 40 MG tablet Take 1 tablet (40 mg total) by mouth daily. 90 tablet 3  . aspirin 81 MG tablet Take 1 tablet (81 mg total) by mouth daily. (Patient not taking: Reported on 07/13/2019) 30 tablet 5  . blood glucose meter kit and supplies KIT Dispense based on patient and insurance preference. Use up to four times daily as directed. (FOR ICD-9 250.00, 250.01). (Patient not taking: Reported on 07/13/2019) 1 each 0  . hydrocortisone cream 1 % Apply to affected area 2 times daily (Patient not taking: Reported on 03/30/2019) 15 g 3   No facility-administered medications prior to visit.    No Known Allergies  ROS Review of Systems  Constitutional: Positive for fatigue.  HENT: Negative.   Eyes: Negative.   Respiratory: Negative.   Cardiovascular: Negative.   Gastrointestinal: Negative.   Endocrine: Negative.   Genitourinary: Negative.   Musculoskeletal: Negative.   Skin: Negative.   Allergic/Immunologic: Negative.   Neurological: Positive for dizziness (occasional ) and headaches (occasional ).  Hematological: Negative.   Psychiatric/Behavioral: Negative.       Objective:    Physical Exam  Constitutional: He is oriented to person, place, and time. He appears well-developed and well-nourished.  HENT:  Head: Normocephalic and atraumatic.  Eyes: Conjunctivae are normal.  Cardiovascular: Normal rate, regular rhythm, normal heart sounds and intact distal pulses.  Pulmonary/Chest: Effort normal and breath sounds normal.  Abdominal: Soft. Bowel sounds are normal. He exhibits distension (obese).  Musculoskeletal:        General: Normal range of motion.     Cervical back: Normal range of motion and neck supple.  Neurological: He is alert and oriented to person, place, and  time. He has normal reflexes.  Skin: Skin is warm and dry.  Psychiatric: He has a normal mood and affect. His behavior is normal. Judgment and thought content normal.  Nursing note and vitals reviewed.   BP 136/89   Pulse 75   Temp 98.3 F (36.8 C) (Oral)   Ht 5' 8"  (1.727 m)   Wt 184 lb 6.4 oz (83.6 kg)   SpO2 98%   BMI 28.04 kg/m  Wt Readings from Last 3 Encounters:  07/13/19 184 lb 6.4 oz (83.6 kg)  03/30/19 168 lb 9.6 oz (76.5 kg)  12/26/18 165 lb (74.8 kg)     Health Maintenance Due  Topic Date Due  . PNEUMOCOCCAL POLYSACCHARIDE VACCINE AGE 43-64 HIGH RISK  09/27/1967  . FOOT EXAM  09/27/1975  . OPHTHALMOLOGY EXAM  09/27/1975  . COLONOSCOPY  09/27/2015  . INFLUENZA VACCINE  12/20/2018  There are no preventive care reminders to display for this patient.  Lab Results  Component Value Date   TSH 2.100 08/09/2017   Lab Results  Component Value Date   WBC 7.4 11/24/2018   HGB 15.4 11/24/2018   HCT 46.0 11/24/2018   MCV 95 11/24/2018   PLT 245 11/24/2018   Lab Results  Component Value Date   NA 141 11/24/2018   K 4.0 11/24/2018   CO2 18 (L) 11/24/2018   GLUCOSE 108 (H) 11/24/2018   BUN 11 11/24/2018   CREATININE 0.76 11/24/2018   BILITOT 0.5 11/24/2018   ALKPHOS 94 11/24/2018   AST 51 (H) 11/24/2018   ALT 34 11/24/2018   PROT 7.6 11/24/2018   ALBUMIN 4.9 11/24/2018   CALCIUM 9.0 11/24/2018   ANIONGAP 13 05/20/2018   Lab Results  Component Value Date   CHOL 202 (H) 11/24/2018   Lab Results  Component Value Date   HDL 62 11/24/2018   Lab Results  Component Value Date   LDLCALC Comment 11/24/2018   Lab Results  Component Value Date   TRIG 420 (H) 11/24/2018   Lab Results  Component Value Date   CHOLHDL 3.3 11/24/2018   Lab Results  Component Value Date   HGBA1C 6.7 (A) 07/13/2019      Assessment & Plan:   1. Pre-diabetes He will continue medication as prescribed, to decrease foods/beverages high in sugars and carbs and follow  Heart Healthy or DASH diet. Increase physical activity to at least 30 minutes cardio exercise daily.   2. Hemoglobin A1c less than 7.0% Hgb A1c stable at 6.7 today. Monitor.   3. Essential hypertension The current medical regimen is effective; blood pressure is stable at 136/89 today; continue present plan and medications as prescribed. He will continue to take medications as prescribed, to decrease high sodium intake, excessive alcohol intake, increase potassium intake, smoking cessation, and increase physical activity of at least 30 minutes of cardio activity daily. He will continue to follow Heart Healthy or DASH diet.  4. Dyslipidemia - Lipid Panel  5. Healthcare maintenance - POCT urinalysis dipstick - POCT glycosylated hemoglobin (Hb A1C) - POCT glucose (manual entry)  6. Follow up He will follow up in 4 months.   No orders of the defined types were placed in this encounter.  Orders Placed This Encounter  Procedures  . Lipid Panel  . POCT urinalysis dipstick  . POCT glycosylated hemoglobin (Hb A1C)  . POCT glucose (manual entry)    Referral Orders  No referral(s) requested today    Kathe Becton,  MSN, FNP-BC [facility] [address] [place], [place] [address] [phone]- fax     Problem List Items Addressed This Visit      Cardiovascular and Mediastinum   Essential hypertension     Other   Dyslipidemia   Relevant Orders   Lipid Panel   Pre-diabetes - Primary    Other Visit Diagnoses    Hemoglobin A1c less than 7.0%       Healthcare maintenance       Relevant Orders   POCT urinalysis dipstick   POCT glycosylated hemoglobin (Hb A1C) (Completed)   POCT glucose (manual entry) (Completed)   Follow up          No orders of the defined types were placed in this encounter.   Follow-up: Return in about 5 months (around 12/10/2019).    Azzie Glatter, FNP

## 2019-07-14 LAB — LIPID PANEL
Chol/HDL Ratio: 7.3 ratio — ABNORMAL HIGH (ref 0.0–5.0)
Cholesterol, Total: 249 mg/dL — ABNORMAL HIGH (ref 100–199)
HDL: 34 mg/dL — ABNORMAL LOW (ref >39)
LDL Chol Calc (NIH): 107 mg/dL — ABNORMAL HIGH (ref 0–99)
Triglycerides: 626 mg/dL (ref 0–149)
VLDL Cholesterol Cal: 108 mg/dL — ABNORMAL HIGH (ref 5–40)

## 2019-07-16 DIAGNOSIS — R7309 Other abnormal glucose: Secondary | ICD-10-CM | POA: Insufficient documentation

## 2019-12-11 ENCOUNTER — Ambulatory Visit: Payer: Self-pay | Admitting: Family Medicine

## 2019-12-23 ENCOUNTER — Ambulatory Visit (INDEPENDENT_AMBULATORY_CARE_PROVIDER_SITE_OTHER): Payer: Self-pay | Admitting: Podiatry

## 2019-12-23 ENCOUNTER — Ambulatory Visit (INDEPENDENT_AMBULATORY_CARE_PROVIDER_SITE_OTHER): Payer: Self-pay

## 2019-12-23 ENCOUNTER — Other Ambulatory Visit: Payer: Self-pay

## 2019-12-23 DIAGNOSIS — M79672 Pain in left foot: Secondary | ICD-10-CM

## 2019-12-23 DIAGNOSIS — S90852A Superficial foreign body, left foot, initial encounter: Secondary | ICD-10-CM

## 2019-12-23 DIAGNOSIS — L089 Local infection of the skin and subcutaneous tissue, unspecified: Secondary | ICD-10-CM

## 2019-12-23 MED ORDER — CEPHALEXIN 500 MG PO CAPS: 500.0000 mg | ORAL_CAPSULE | Freq: Three times a day (TID) | ORAL | 0 refills | Status: AC

## 2019-12-23 NOTE — Progress Notes (Signed)
  Subjective:  Patient ID: Derek Cruz, male    DOB: Jan 02, 1966,  MRN: [id number]  Chief Complaint  Patient presents with  . Foot Pain    pt is here for a foreign body to the left 2nd toe. pt states that it is painful to walk on.    54 y.o. male presents with the above complaint. History confirmed with patient.  Was walking in his sandals while working about a week ago and stopped his foot on a cactus.  He thinks that the cactus spine is still stuck in his toe.  It is very painful.  He is primarily Spanish-speaking, translator is present and translates from [place] to [place].  He has type 2 diabetes and is well controlled.  Objective:  Physical Exam: warm, good capillary refill and normal DP and PT pulses. Left Foot: Second toe with puncture wound plantar PIPJ, there is a small entry wound and abscess here.  Radiographs: X-ray of the left foot: No foreign body noted on plain film radiographs Assessment:   1. Foot pain, left   2. Foreign body in left foot with infection, initial encounter      Plan:  Patient was evaluated and treated and all questions answered.   -Advised him that we should attempt to extract any foreign body that is present. -Following local anesthesia of the digit with 1.5 cc 0.5% Marcaine plain and 1.5 cc 2% Xylocaine plain and sterile Betadine prep, a digital tourniquet was applied and a small incision was made through the puncture wound and the wound was explored.  I was unable to extract any definite foreign body.  The wound was to the level of the subcutaneous tissue.  There was no exposed tendon.  Less than 1 cc of purulence was expressed from the wound during the initial incision.  The wound was thoroughly irrigated with hydrogen peroxide and dressed with antibiotic ointment and a dry gauze dressing -Prescription for Keflex x14 days was printed for him to take to his pharmacy -Strict return precautions were given regarding signs and symptoms of  worsening infection  If no improvement, will follow up in 2 weeks  Sharl Ma, DPM 12/23/2019

## 2019-12-23 NOTE — Patient Instructions (Signed)

## 2019-12-24 ENCOUNTER — Other Ambulatory Visit: Payer: Self-pay | Admitting: Podiatry

## 2019-12-24 DIAGNOSIS — L089 Local infection of the skin and subcutaneous tissue, unspecified: Secondary | ICD-10-CM

## 2019-12-25 ENCOUNTER — Ambulatory Visit: Payer: Self-pay | Admitting: Family Medicine

## 2020-01-04 ENCOUNTER — Other Ambulatory Visit: Payer: Self-pay

## 2020-01-04 ENCOUNTER — Ambulatory Visit (INDEPENDENT_AMBULATORY_CARE_PROVIDER_SITE_OTHER): Payer: Self-pay | Admitting: Family Medicine

## 2020-01-04 ENCOUNTER — Encounter: Payer: Self-pay | Admitting: Family Medicine

## 2020-01-04 VITALS — BP 124/76 | HR 94 | Temp 97.9°F | Resp 18 | Ht 66.0 in | Wt 178.8 lb

## 2020-01-04 DIAGNOSIS — E118 Type 2 diabetes mellitus with unspecified complications: Secondary | ICD-10-CM

## 2020-01-04 DIAGNOSIS — R21 Rash and other nonspecific skin eruption: Secondary | ICD-10-CM

## 2020-01-04 DIAGNOSIS — Z09 Encounter for follow-up examination after completed treatment for conditions other than malignant neoplasm: Secondary | ICD-10-CM

## 2020-01-04 DIAGNOSIS — I10 Essential (primary) hypertension: Secondary | ICD-10-CM

## 2020-01-04 LAB — POCT URINALYSIS DIPSTICK
Bilirubin, UA: NEGATIVE
Blood, UA: NEGATIVE
Glucose, UA: POSITIVE — AB
Ketones, UA: NEGATIVE
Leukocytes, UA: NEGATIVE
Nitrite, UA: NEGATIVE
Protein, UA: NEGATIVE
Spec Grav, UA: 1.02 (ref 1.010–1.025)
Urobilinogen, UA: 0.2 E.U./dL
pH, UA: 5 (ref 5.0–8.0)

## 2020-01-04 LAB — GLUCOSE, POCT (MANUAL RESULT ENTRY): POC Glucose: 349 mg/dl — AB (ref 70–99)

## 2020-01-04 LAB — POCT GLYCOSYLATED HEMOGLOBIN (HGB A1C)
HbA1c POC (<> result, manual entry): 9.8 % (ref 4.0–5.6)
HbA1c, POC (controlled diabetic range): 9.8 % — AB (ref 0.0–7.0)
HbA1c, POC (prediabetic range): 9.8 % — AB (ref 5.7–6.4)
Hemoglobin A1C: 9.8 % — AB (ref 4.0–5.6)

## 2020-01-04 MED ORDER — CLOTRIMAZOLE 1 % EX CREA: 1.0000 "application " | TOPICAL_CREAM | Freq: Two times a day (BID) | CUTANEOUS | 3 refills | Status: DC

## 2020-01-04 NOTE — Progress Notes (Signed)
Patient [facility]  Sick Visit  Subjective:  Patient ID: Rudy Luhmann, male    DOB: [birth year]  Age: 54 y.o. MRN: [id number]  CC:  Chief Complaint  Patient presents with  . Rash    pt states he has a rash on his penis. Pt states no leakage from his penis.X1wk. pT STATES NO PAIN OR BURNING WHEN he urinate.     HPI Jerrell Jayveon Convey is a 54 year old male who presents for Follow Up today.   Patient Active Problem List   Diagnosis Date Noted  . Hemoglobin A1c less than 7.0% 07/16/2019  . Neck pain 03/31/2019  . Muscle strain 03/31/2019  . Generalized abdominal pain 03/31/2019  . Gastroesophageal reflux disease without esophagitis 11/24/2018  . Chest discomfort 11/24/2018  . Essential hypertension 11/24/2018  . Pre-diabetes 11/24/2018  . Dyslipidemia 10/06/2016  . Chest pain 09/26/2016  . Diabetes mellitus type 2, controlled ([place]) 09/26/2016    Past Medical History:  Diagnosis Date  . Chest pain   . Dyslipidemia   . Gastritis   . Hypertension     No past surgical history on file.  Family History  Problem Relation Age of Onset  . Diabetes Mellitus II Neg Hx     Current Status: Since his last office visit, he has c/o rash on penis, with itching X 1 week. He She denies urinary frequency, discharge, dysuria, burning, odor, hematuria, and suprapubic pain/discomfort. He denies fevers, chills, fatigue, recent infections, weight loss, and night sweats. He has not had any headaches, visual changes, dizziness, and falls. No chest pain, heart palpitations, cough and shortness of breath reported. Denies GI problems such as nausea, vomiting, diarrhea, and constipation. He has no reports of blood in stools, dysuria and hematuria. No depression or anxiety reports today. He is taking all medications as prescribed. He denies pain today.     Social History   Socioeconomic History  . Marital status: Married    Spouse  name: Not on file  . Number of children: Not on file  . Years of education: Not on file  . Highest education level: Not on file  Occupational History  . Not on file  Tobacco Use  . Smoking status: Former Research scientist (life sciences)  . Smokeless tobacco: Never Used  Vaping Use  . Vaping Use: Never used  Substance and Sexual Activity  . Alcohol use: Yes  . Drug use: No  . Sexual activity: Not on file  Other Topics Concern  . Not on file  Social History Narrative  . Not on file   Social Determinants of Health   Financial Resource Strain:   . Difficulty of Paying Living Expenses:   Food Insecurity:   . Worried About Charity fundraiser in the Last Year:   . Arboriculturist in the Last Year:   Transportation Needs:   . Film/video editor (Medical):   Marland Kitchen Lack of Transportation (Non-Medical):   Physical Activity:   . Days of Exercise per Week:   . Minutes of Exercise per Session:   Stress:   . Feeling of Stress :   Social Connections:   . Frequency of Communication with Friends and Family:   . Frequency of Social Gatherings with Friends and Family:   . Attends Religious Services:   . Active Member of Clubs or Organizations:   . Attends Archivist Meetings:   Marland Kitchen Marital Status:   Intimate Partner Violence:   .  Fear of Current or Ex-Partner:   . Emotionally Abused:   Marland Kitchen Physically Abused:   . Sexually Abused:     Outpatient Medications Prior to Visit  Medication Sig Dispense Refill  . aspirin 81 MG tablet Take 1 tablet (81 mg total) by mouth daily. 30 tablet 5  . blood glucose meter kit and supplies KIT Dispense based on patient and insurance preference. Use up to four times daily as directed. (FOR ICD-9 250.00, 250.01). 1 each 0  . cephALEXin (KEFLEX) 500 MG capsule Take 1 capsule (500 mg total) by mouth 3 (three) times daily for 14 days. 42 capsule 0  . diclofenac sodium (VOLTAREN) 1 % GEL Apply 4 g topically 4 (four) times daily. 4 g 3  . lisinopril (ZESTRIL) 5 MG tablet Take 1  tablet (5 mg total) by mouth daily. 30 tablet 3  . metFORMIN (GLUCOPHAGE) 1000 MG tablet Take 1 tablet with meals twice daily. 60 tablet 3  . omeprazole (PRILOSEC) 20 MG capsule Take 1 capsule (20 mg total) by mouth daily. 30 capsule 3  . pravastatin (PRAVACHOL) 40 MG tablet Take 1 tablet (40 mg total) by mouth daily. 90 tablet 3  . hydrocortisone cream 1 % Apply to affected area 2 times daily (Patient not taking: Reported on 01/04/2020) 15 g 3  . ibuprofen (ADVIL) 800 MG tablet Take 1 tablet (800 mg total) by mouth every 8 (eight) hours as needed. (Patient not taking: Reported on 01/04/2020) 30 tablet 3   No facility-administered medications prior to visit.    No Known Allergies  ROS Review of Systems  Constitutional: Negative.   HENT: Negative.   Eyes: Negative.   Respiratory: Negative.   Cardiovascular: Negative.   Gastrointestinal: Positive for abdominal distention.  Endocrine: Negative.   Genitourinary: Positive for penile swelling.       Rash on penis  Musculoskeletal: Negative.   Skin: Positive for rash (penis).  Allergic/Immunologic: Negative.   Neurological: Negative.   Hematological: Negative.   Psychiatric/Behavioral: Negative.    Objective:    Physical Exam Vitals and nursing note reviewed.  Constitutional:      Appearance: Normal appearance.  HENT:     Head: Normocephalic and atraumatic.     Nose: Nose normal.     Mouth/Throat:     Mouth: Mucous membranes are moist.  Cardiovascular:     Rate and Rhythm: Normal rate and regular rhythm.     Pulses: Normal pulses.     Heart sounds: Normal heart sounds.  Pulmonary:     Effort: Pulmonary effort is normal.     Breath sounds: Normal breath sounds.  Abdominal:     General: Bowel sounds are normal.     Palpations: Abdomen is soft.  Musculoskeletal:        General: Normal range of motion.     Cervical back: Normal range of motion and neck supple.  Skin:    General: Skin is warm and dry.     Findings: Rash  (penis ) present.  Neurological:     General: No focal deficit present.     Mental Status: He is alert and oriented to person, place, and time.  Psychiatric:        Mood and Affect: Mood normal.        Behavior: Behavior normal.        Thought Content: Thought content normal.        Judgment: Judgment normal.     BP 124/76 (BP Location: Left Arm, Patient Position:  Sitting, Cuff Size: Normal)   Pulse 94   Temp 97.9 F (36.6 C)   Resp 18   Ht _0  (1.676 m)   Wt 178 lb 12.8 oz (81.1 kg)   SpO2 96%   BMI 28.86 kg/m  Wt Readings from Last 3 Encounters:  01/04/20 178 lb 12.8 oz (81.1 kg)  07/13/19 184 lb 6.4 oz (83.6 kg)  03/30/19 168 lb 9.6 oz (76.5 kg)     Health Maintenance Due  Topic Date Due  . Hepatitis C Screening  Never done  . PNEUMOCOCCAL POLYSACCHARIDE VACCINE AGE 70-64 HIGH RISK  Never done  . FOOT EXAM  Never done  . OPHTHALMOLOGY EXAM  Never done  . COVID-19 Vaccine (1) Never done  . COLONOSCOPY  Never done  . INFLUENZA VACCINE  12/20/2019    There are no preventive care reminders to display for this patient.  Lab Results  Component Value Date   TSH 2.100 08/09/2017   Lab Results  Component Value Date   WBC 7.4 11/24/2018   HGB 15.4 11/24/2018   HCT 46.0 11/24/2018   MCV 95 11/24/2018   PLT 245 11/24/2018   Lab Results  Component Value Date   NA 141 11/24/2018   K 4.0 11/24/2018   CO2 18 (L) 11/24/2018   GLUCOSE 108 (H) 11/24/2018   BUN 11 11/24/2018   CREATININE 0.76 11/24/2018   BILITOT 0.5 11/24/2018   ALKPHOS 94 11/24/2018   AST 51 (H) 11/24/2018   ALT 34 11/24/2018   PROT 7.6 11/24/2018   ALBUMIN 4.9 11/24/2018   CALCIUM 9.0 11/24/2018   ANIONGAP 13 05/20/2018   Lab Results  Component Value Date   CHOL 249 (H) 07/13/2019   Lab Results  Component Value Date   HDL 34 (L) 07/13/2019   Lab Results  Component Value Date   LDLCALC 107 (H) 07/13/2019   Lab Results  Component Value Date   TRIG 626 (HH) 07/13/2019   Lab  Results  Component Value Date   CHOLHDL 7.3 (H) 07/13/2019   Lab Results  Component Value Date   HGBA1C 9.8 (A) 01/04/2020   HGBA1C 9.8 01/04/2020   HGBA1C 9.8 (A) 01/04/2020   HGBA1C 9.8 (A) 01/04/2020      Assessment & Plan:   1. Type 2 diabetes mellitus with complication, without long-term current use of insulin ([place]) He will continue medication as prescribed, to decrease foods/beverages high in sugars and carbs and follow Heart Healthy or DASH diet. Increase physical activity to at least 30 minutes cardio exercise daily.  - Urinalysis Dipstick - POC Glucose (CBG) - POC HgB A1c  2. Rash of penis - clotrimazole (LOTRIMIN) 1 % cream; Apply 1 application topically 2 (two) times daily.  Dispense: 30 g; Refill: 3 - Chlamydia/Gonococcus/Trichomonas, NAA  3. Essential hypertension The current medical regimen is effective; blood pressure is stable at 124/76 today; continue present plan and medications as prescribed. He will continue to take medications as prescribed, to decrease high sodium intake, excessive alcohol intake, increase potassium intake, smoking cessation, and increase physical activity of at least 30 minutes of cardio activity daily. He will continue to follow Heart Healthy or DASH diet.  4. Follow up He will follow up in 6 months.   Meds ordered this encounter  Medications  . clotrimazole (LOTRIMIN) 1 % cream    Sig: Apply 1 application topically 2 (two) times daily.    Dispense:  30 g    Refill:  3   Orders Placed  This Encounter  Procedures  . Chlamydia/Gonococcus/Trichomonas, NAA  . Urinalysis Dipstick  . POC Glucose (CBG)  . POC HgB A1c    Referral Orders  No referral(s) requested today    Kathe Becton,  MSN, FNP-BC [place], [place] [address] [phone]- fax   Problem List Items Addressed This Visit       Cardiovascular and Mediastinum   Essential hypertension    Other Visit Diagnoses    Type 2 diabetes mellitus with complication, without long-term current use of insulin ([place])    -  Primary   Relevant Orders   Urinalysis Dipstick (Completed)   POC Glucose (CBG) (Completed)   POC HgB A1c (Completed)   Rash of penis       Relevant Medications   clotrimazole (LOTRIMIN) 1 % cream   Other Relevant Orders   Chlamydia/Gonococcus/Trichomonas, NAA (Completed)   Follow up          Meds ordered this encounter  Medications  . clotrimazole (LOTRIMIN) 1 % cream    Sig: Apply 1 application topically 2 (two) times daily.    Dispense:  30 g    Refill:  3    Follow-up: No follow-ups on file.    Azzie Glatter, FNP

## 2020-01-05 ENCOUNTER — Encounter: Payer: Self-pay | Admitting: Family Medicine

## 2020-01-06 LAB — CHLAMYDIA/GONOCOCCUS/TRICHOMONAS, NAA
Chlamydia by NAA: NEGATIVE
Gonococcus by NAA: NEGATIVE
Trich vag by NAA: NEGATIVE

## 2020-01-14 NOTE — Progress Notes (Signed)
Pt was called @ 1:21 pm to discuss lab result.  Pt can not receive VM because it has not been set up yet.

## 2020-01-15 ENCOUNTER — Ambulatory Visit: Payer: Self-pay | Admitting: Family Medicine

## 2020-05-07 ENCOUNTER — Emergency Department (HOSPITAL_COMMUNITY): Payer: Self-pay

## 2020-05-07 ENCOUNTER — Inpatient Hospital Stay (HOSPITAL_COMMUNITY)
Admission: EM | Admit: 2020-05-07 | Discharge: 2020-05-09 | DRG: 391 | Disposition: A | Discharge status: Home / Self Care | Payer: Self-pay | Attending: Family Medicine | Admitting: Family Medicine

## 2020-05-07 ENCOUNTER — Encounter (HOSPITAL_COMMUNITY): Payer: Self-pay | Admitting: Emergency Medicine

## 2020-05-07 ENCOUNTER — Other Ambulatory Visit: Payer: Self-pay

## 2020-05-07 DIAGNOSIS — K7010 Alcoholic hepatitis without ascites: Secondary | ICD-10-CM | POA: Diagnosis present

## 2020-05-07 DIAGNOSIS — E876 Hypokalemia: Secondary | ICD-10-CM | POA: Diagnosis present

## 2020-05-07 DIAGNOSIS — Z79899 Other long term (current) drug therapy: Secondary | ICD-10-CM

## 2020-05-07 DIAGNOSIS — K219 Gastro-esophageal reflux disease without esophagitis: Secondary | ICD-10-CM | POA: Diagnosis present

## 2020-05-07 DIAGNOSIS — K760 Fatty (change of) liver, not elsewhere classified: Secondary | ICD-10-CM | POA: Diagnosis present

## 2020-05-07 DIAGNOSIS — Z6827 Body mass index (BMI) 27.0-27.9, adult: Secondary | ICD-10-CM

## 2020-05-07 DIAGNOSIS — D689 Coagulation defect, unspecified: Secondary | ICD-10-CM | POA: Diagnosis present

## 2020-05-07 DIAGNOSIS — M549 Dorsalgia, unspecified: Secondary | ICD-10-CM | POA: Diagnosis present

## 2020-05-07 DIAGNOSIS — K7290 Hepatic failure, unspecified without coma: Secondary | ICD-10-CM | POA: Diagnosis present

## 2020-05-07 DIAGNOSIS — I10 Essential (primary) hypertension: Secondary | ICD-10-CM | POA: Diagnosis present

## 2020-05-07 DIAGNOSIS — D751 Secondary polycythemia: Secondary | ICD-10-CM | POA: Diagnosis present

## 2020-05-07 DIAGNOSIS — K7200 Acute and subacute hepatic failure without coma: Secondary | ICD-10-CM | POA: Diagnosis present

## 2020-05-07 DIAGNOSIS — R278 Other lack of coordination: Secondary | ICD-10-CM | POA: Diagnosis present

## 2020-05-07 DIAGNOSIS — R7989 Other specified abnormal findings of blood chemistry: Secondary | ICD-10-CM | POA: Diagnosis present

## 2020-05-07 DIAGNOSIS — M1990 Unspecified osteoarthritis, unspecified site: Secondary | ICD-10-CM | POA: Diagnosis present

## 2020-05-07 DIAGNOSIS — R748 Abnormal levels of other serum enzymes: Secondary | ICD-10-CM

## 2020-05-07 DIAGNOSIS — K2920 Alcoholic gastritis without bleeding: Principal | ICD-10-CM | POA: Diagnosis present

## 2020-05-07 DIAGNOSIS — E871 Hypo-osmolality and hyponatremia: Secondary | ICD-10-CM | POA: Diagnosis present

## 2020-05-07 DIAGNOSIS — E860 Dehydration: Secondary | ICD-10-CM | POA: Diagnosis present

## 2020-05-07 DIAGNOSIS — F1010 Alcohol abuse, uncomplicated: Secondary | ICD-10-CM | POA: Diagnosis present

## 2020-05-07 DIAGNOSIS — R630 Anorexia: Secondary | ICD-10-CM | POA: Diagnosis present

## 2020-05-07 DIAGNOSIS — Z23 Encounter for immunization: Secondary | ICD-10-CM

## 2020-05-07 DIAGNOSIS — R109 Unspecified abdominal pain: Secondary | ICD-10-CM

## 2020-05-07 DIAGNOSIS — Z20822 Contact with and (suspected) exposure to covid-19: Secondary | ICD-10-CM | POA: Diagnosis present

## 2020-05-07 DIAGNOSIS — Z7984 Long term (current) use of oral hypoglycemic drugs: Secondary | ICD-10-CM

## 2020-05-07 DIAGNOSIS — E785 Hyperlipidemia, unspecified: Secondary | ICD-10-CM | POA: Diagnosis present

## 2020-05-07 DIAGNOSIS — Z87891 Personal history of nicotine dependence: Secondary | ICD-10-CM

## 2020-05-07 DIAGNOSIS — E1165 Type 2 diabetes mellitus with hyperglycemia: Secondary | ICD-10-CM | POA: Diagnosis present

## 2020-05-07 DIAGNOSIS — Z7982 Long term (current) use of aspirin: Secondary | ICD-10-CM

## 2020-05-07 LAB — COMPREHENSIVE METABOLIC PANEL
ALT: 1092 U/L — ABNORMAL HIGH (ref 0–44)
AST: 1944 U/L — ABNORMAL HIGH (ref 15–41)
Albumin: 3.8 g/dL (ref 3.5–5.0)
Alkaline Phosphatase: 96 U/L (ref 38–126)
Anion gap: 15 (ref 5–15)
BUN: 14 mg/dL (ref 6–20)
CO2: 20 mmol/L — ABNORMAL LOW (ref 22–32)
Calcium: 7.7 mg/dL — ABNORMAL LOW (ref 8.9–10.3)
Chloride: 95 mmol/L — ABNORMAL LOW (ref 98–111)
Creatinine, Ser: 0.69 mg/dL (ref 0.61–1.24)
GFR, Estimated: >60 mL/min (ref >60)
Glucose, Bld: 192 mg/dL — ABNORMAL HIGH (ref 70–99)
Potassium: 4.1 mmol/L (ref 3.5–5.1)
Sodium: 130 mmol/L — ABNORMAL LOW (ref 135–145)
Total Bilirubin: 1.7 mg/dL — ABNORMAL HIGH (ref 0.3–1.2)
Total Protein: 7.1 g/dL (ref 6.5–8.1)

## 2020-05-07 LAB — URINALYSIS, ROUTINE W REFLEX MICROSCOPIC
Bacteria, UA: NONE SEEN
Bilirubin Urine: NEGATIVE
Glucose, UA: NEGATIVE mg/dL
Hgb urine dipstick: NEGATIVE
Ketones, ur: NEGATIVE mg/dL
Leukocytes,Ua: NEGATIVE
Nitrite: NEGATIVE
Protein, ur: 30 mg/dL — AB
Specific Gravity, Urine: 1.038 — ABNORMAL HIGH (ref 1.005–1.030)
pH: 6 (ref 5.0–8.0)

## 2020-05-07 LAB — CBC
HCT: 48.8 % (ref 39.0–52.0)
Hemoglobin: 18.1 g/dL — ABNORMAL HIGH (ref 13.0–17.0)
MCH: 32.1 pg (ref 26.0–34.0)
MCHC: 37.1 g/dL — ABNORMAL HIGH (ref 30.0–36.0)
MCV: 86.5 fL (ref 80.0–100.0)
Platelets: 263 10*3/uL (ref 150–400)
RBC: 5.64 MIL/uL (ref 4.22–5.81)
RDW: 12.7 % (ref 11.5–15.5)
WBC: 8.5 10*3/uL (ref 4.0–10.5)
nRBC: 0 % (ref 0.0–0.2)

## 2020-05-07 LAB — LACTIC ACID, PLASMA
Lactic Acid, Venous: 2.9 mmol/L (ref 0.5–1.9)
Lactic Acid, Venous: 3.4 mmol/L (ref 0.5–1.9)
Lactic Acid, Venous: 3.4 mmol/L (ref 0.5–1.9)

## 2020-05-07 LAB — CBG MONITORING, ED: Glucose-Capillary: 134 mg/dL — ABNORMAL HIGH (ref 70–99)

## 2020-05-07 LAB — RESP PANEL BY RT-PCR (FLU A&B, COVID) ARPGX2
Influenza A by PCR: NEGATIVE
Influenza B by PCR: NEGATIVE
SARS Coronavirus 2 by RT PCR: NEGATIVE

## 2020-05-07 LAB — HEPATITIS PANEL, ACUTE
HCV Ab: NONREACTIVE
Hep A IgM: NONREACTIVE
Hep B C IgM: NONREACTIVE
Hepatitis B Surface Ag: NONREACTIVE

## 2020-05-07 LAB — PROTIME-INR
INR: 1.4 — ABNORMAL HIGH (ref 0.8–1.2)
Prothrombin Time: 17.1 seconds — ABNORMAL HIGH (ref 11.4–15.2)

## 2020-05-07 LAB — LIPASE, BLOOD: Lipase: 24 U/L (ref 11–51)

## 2020-05-07 MED ORDER — SODIUM CHLORIDE 0.9 % IV BOLUS
1000.0000 mL | Freq: Once | INTRAVENOUS | Status: AC
  Administered 2020-05-07: 17:00:00 1000 mL via INTRAVENOUS

## 2020-05-07 MED ORDER — LORAZEPAM 1 MG PO TABS
0.0000 mg | ORAL_TABLET | Freq: Two times a day (BID) | ORAL | Status: DC
  Filled 2020-05-07 (×2): qty 1

## 2020-05-07 MED ORDER — LACTATED RINGERS IV BOLUS: 1000.0000 mL | Freq: Once | INTRAVENOUS | Status: DC

## 2020-05-07 MED ORDER — PANTOPRAZOLE SODIUM 40 MG IV SOLR
40.0000 mg | Freq: Once | INTRAVENOUS | Status: AC
  Administered 2020-05-07: 17:00:00 40 mg via INTRAVENOUS
  Filled 2020-05-07: qty 40

## 2020-05-07 MED ORDER — ENOXAPARIN SODIUM 40 MG/0.4ML ~~LOC~~ SOLN
40.0000 mg | Freq: Every day | SUBCUTANEOUS | Status: DC
  Administered 2020-05-08 – 2020-05-09 (×2): 40 mg via SUBCUTANEOUS
  Filled 2020-05-07 (×2): qty 0.4

## 2020-05-07 MED ORDER — FOLIC ACID 1 MG PO TABS
1.0000 mg | ORAL_TABLET | Freq: Every day | ORAL | Status: DC
  Administered 2020-05-08 – 2020-05-09 (×2): 1 mg via ORAL
  Filled 2020-05-07 (×2): qty 1

## 2020-05-07 MED ORDER — LORAZEPAM 1 MG PO TABS
0.0000 mg | ORAL_TABLET | Freq: Four times a day (QID) | ORAL | Status: DC
  Administered 2020-05-09 (×2): 1 mg via ORAL

## 2020-05-07 MED ORDER — FAMOTIDINE 20 MG PO TABS
40.0000 mg | ORAL_TABLET | Freq: Once | ORAL | Status: AC
  Administered 2020-05-07: 17:00:00 40 mg via ORAL
  Filled 2020-05-07: qty 2

## 2020-05-07 MED ORDER — PANTOPRAZOLE SODIUM 40 MG IV SOLR
40.0000 mg | Freq: Two times a day (BID) | INTRAVENOUS | Status: DC
  Administered 2020-05-08 – 2020-05-09 (×3): 40 mg via INTRAVENOUS
  Filled 2020-05-07 (×3): qty 40

## 2020-05-07 MED ORDER — LORAZEPAM 2 MG/ML IJ SOLN
1.0000 mg | Freq: Once | INTRAMUSCULAR | Status: AC
  Administered 2020-05-07: 20:00:00 1 mg via INTRAVENOUS
  Filled 2020-05-07 (×2): qty 1

## 2020-05-07 MED ORDER — IOHEXOL 300 MG/ML  SOLN
100.0000 mL | Freq: Once | INTRAMUSCULAR | Status: AC | PRN
  Administered 2020-05-07: 100 mL via INTRAVENOUS

## 2020-05-07 MED ORDER — INSULIN ASPART 100 UNIT/ML ~~LOC~~ SOLN
0.0000 [IU] | Freq: Three times a day (TID) | SUBCUTANEOUS | Status: DC
  Administered 2020-05-08: 13:00:00 2 [IU] via SUBCUTANEOUS
  Administered 2020-05-09: 12:00:00 1 [IU] via SUBCUTANEOUS

## 2020-05-07 MED ORDER — LACTATED RINGERS IV SOLN: INTRAVENOUS | Status: DC

## 2020-05-07 MED ORDER — SODIUM CHLORIDE 0.9 % IV BOLUS
1000.0000 mL | Freq: Once | INTRAVENOUS | Status: AC
  Administered 2020-05-07: 15:00:00 1000 mL via INTRAVENOUS

## 2020-05-07 MED ORDER — THIAMINE HCL 100 MG PO TABS
100.0000 mg | ORAL_TABLET | Freq: Every day | ORAL | Status: DC
  Administered 2020-05-08 – 2020-05-09 (×2): 100 mg via ORAL
  Filled 2020-05-07 (×2): qty 1

## 2020-05-07 MED ORDER — LACTULOSE 10 GM/15ML PO SOLN
20.0000 g | Freq: Every day | ORAL | Status: DC
  Administered 2020-05-08: 10:00:00 20 g via ORAL
  Filled 2020-05-07 (×2): qty 30

## 2020-05-07 MED ORDER — MORPHINE SULFATE (PF) 4 MG/ML IV SOLN
4.0000 mg | Freq: Once | INTRAVENOUS | Status: AC
Start: 2020-05-07 — End: 2020-05-07
  Administered 2020-05-07: 15:00:00 4 mg via INTRAVENOUS
  Filled 2020-05-07: qty 1

## 2020-05-07 MED ORDER — LORAZEPAM 2 MG/ML IJ SOLN: 0.0000 mg | Freq: Two times a day (BID) | INTRAMUSCULAR | Status: DC

## 2020-05-07 MED ORDER — THIAMINE HCL 100 MG/ML IJ SOLN
100.0000 mg | Freq: Every day | INTRAMUSCULAR | Status: DC
  Administered 2020-05-07: 20:00:00 100 mg via INTRAVENOUS
  Filled 2020-05-07 (×2): qty 2

## 2020-05-07 MED ORDER — LORAZEPAM 2 MG/ML IJ SOLN
0.0000 mg | Freq: Four times a day (QID) | INTRAMUSCULAR | Status: DC
  Administered 2020-05-08: 02:00:00 1 mg via INTRAVENOUS
  Filled 2020-05-07: qty 1

## 2020-05-07 NOTE — H&P (Signed)
[facility] Admission History and Physical Service Pager: [phone]  Patient name: Derek Cruz Medical record number: [id number] Date of birth: 1966-04-22 Age: 54 y.o. Gender: male  Primary Care Provider: Azzie Glatter, FNP Consultants: GI Code Status: full  Preferred Emergency Contact: wife, silvinia [phone]  Chief Complaint: LUQ pain  Assessment and Plan: Derek Cruz is a 54 y.o. male presenting with abdominal pain. PMH is significant for T2DM, GERD, HTN, HLD, Alcohol Use Disorder.   LUQ pain- acute but improved in ED. H/o is highly suspicious of gastritis/ gastric ulcer which is made more likely with h/o GERD and heavy alcohol use. Less likely pancreatitis with normal lipase and CT. Possible referred pain from liver. RUQ US showed hepatic steatosis. Patient appears comfortable and negative for acute abdomen or apparent active bleeding. Tender to palpation of epigastric and LUQ abdomen without mass or rash. LA is elevated despite 2L bolus NS.  - admit to progressive, attending Dr. Andria Frames - consulted GI for possible endoscopy, appreciate recs -  Continue aggressive IV fluid hydration and recheck LA - pain management PRN - IV protonix - FOBT  Hepatic steatosis  borderline acute hepatic failure: patient presented to the ED with acute abdominal pain of the LLQ.  He is given 4 mg of morphine for abdominal pain in the ED, as well as 2 L NS followed by  LR.  Vitals are stable, patient afebrile. RUQ abdominal ultrasound showed "diffuse increased echogenicity, hepatic steatosis without acute abnormality, portal vein patent, normal-appearing gallbladder without stones or wall thickening".  Lipase within normal limits, hepatitis panel nonreactive.  WBC within normal limit 8.5.  Lactic acid initially 2.9, on repeat is elevated at 3.4x2.  CMP on admission concerning for significantly elevated AST at 1944 and ALT at 1092, ALP within  normal limit at 96.  Total bilirubin also elevated at 1.7. Platelets wnl at 260. Negative hepatic encephalopathy but has considerable asterixis and mild jaundice on exam. Portal vein patent on imaging therefore reduces concern for Budd-Chiari.  -GI consulted in the ED, to see the AM of 12/19, appreciate recs -IV Protonix BID -Avoid hepatotoxic meds - lactulose - PT/INR am - CBC am  Alcohol Use: w/ h/o withdrawal tremors and possible seizures. He drinks 4-5 24oz beers per day and had last drink last night. Feels shaky even after ativan but is improved. Contributing to his primary problems.  -Continue CIWA protocol, low threshold to transfer to ICU - TOC referral prior to dc  T2DM: most recent HbA1c 9.8% (Aug 2021), takes Metformin 1000mg  daily, CBG on admission 192. -CBGs -f/u repeat HbA1c -sSSI - hold metformin  HTN: BP on admission 134/91, most recently 144/99, meds prescribed Lisinopril 5mg  daily -continue Lisinopril  -monitor vitals per routine  Polycythemia:  Hgb 18.1 on admission, CBC otherwise WNL. Patient received 2L NS IV in the ED and IV LR. -f/u repeat AM CBC  Hyponatremia: Na 130 on admission, with CBG 192 corrects to 131. Likely related to hyperglycemia and alcohol use. -f/u AM CMP  Dyslipidemia: Most recent lipid panel from February 2021 shows cholesterol 249, triglycerides 626, HDL low at 34, and LDL 107.  Patient prescribed pravastatin 40 mg daily and aspirin 81 g daily outpatient.  Patient's ASCVD risk 25.6%. -Hold statin d/t acute liver inflammation  FEN/GI: full liquid, protonix Prophylaxis: lovenox  Disposition: Progressive unit  History of Present Illness:  Derek Cruz is a 54 y.o. male presenting with abdominal pain of his left lower quadrant, labs  and imaging concerning for acute liver failure and hepatic steatosis.  Derek Cruz has been experiencing left upper quadrant pain for approximately 3 months which he describes as mild burning pain  associated with acid reflux.  It is worsened with eating salsa and spicy foods.  He does not take any over-the-Cruz pain medications and has not tried anything for the acid reflux.  Yesterday evening when he was walking the pain became acutely worse and today was barely able to move due to the pain.  He did have nausea and nonbloody vomit which prompted his presentation to the ED.  He is currently feeling a lot better with pain medication and does not have nausea at this time.  He denies ever having symptoms like this in the past.  Does not have any other sick symptoms.  He has normal bowel movements daily and had a few episodes of bright red blood on his stool a few months ago but has not seen any further blood in his stool since then and does not have any abdominal pain with bowel movements.  Patient drinks approximately 4-5 24oz beers per day.  His last drink was yesterday evening.  He endorses that he has had shaking and seizures in the past when he has stopped drinking alcohol.  Review Of Systems: Per HPI with the following additions:  Review of Systems  Constitutional: Positive for activity change and appetite change. Negative for chills, diaphoresis, fatigue, fever and unexpected weight change.  HENT: Negative for rhinorrhea, sore throat and trouble swallowing.   Respiratory: Positive for cough. Negative for chest tightness, shortness of breath and wheezing.   Cardiovascular: Negative for chest pain.  Gastrointestinal: Positive for abdominal pain, blood in stool, nausea and vomiting. Negative for constipation and diarrhea.  Genitourinary: Negative for difficulty urinating.  Neurological: Positive for tremors.    Patient Active Problem List   Diagnosis Date Noted  . Liver failure ([place]) 05/07/2020  . Alcoholic hepatitis   . Hyponatremia   . Polycythemia   . Hemoglobin A1c less than 7.0% 07/16/2019  . Neck pain 03/31/2019  . Muscle strain 03/31/2019  . Undifferentiated abdominal pain  03/31/2019  . Gastroesophageal reflux disease without esophagitis 11/24/2018  . Chest discomfort 11/24/2018  . Essential hypertension 11/24/2018  . Pre-diabetes 11/24/2018  . Dyslipidemia 10/06/2016  . Chest pain 09/26/2016  . Diabetes mellitus type 2, controlled ([place]) 09/26/2016    Past Medical History: Past Medical History:  Diagnosis Date  . Chest pain   . Dyslipidemia   . Gastritis   . Hypertension     Past Surgical History: History reviewed. No pertinent surgical history.  Social History: Social History   Tobacco Use  . Smoking status: Former Research scientist (life sciences)  . Smokeless tobacco: Never Used  Vaping Use  . Vaping Use: Never used  Substance Use Topics  . Alcohol use: Yes  . Drug use: No   Additional social history: 4-5 beers per day 24 ounces Lives with wife Please also refer to relevant sections of EMR.  Family History: Family History  Problem Relation Age of Onset  . Diabetes Mellitus II Neg Hx     Allergies and Medications: No Known Allergies No current facility-administered medications on file prior to encounter.   Current Outpatient Medications on File Prior to Encounter  Medication Sig Dispense Refill  . lisinopril (ZESTRIL) 20 MG tablet Take 20 mg by mouth daily.    . metFORMIN (GLUCOPHAGE) 500 MG tablet Take 500 mg by mouth 2 (two) times daily.    Marland Kitchen  blood glucose meter kit and supplies KIT Dispense based on patient and insurance preference. Use up to four times daily as directed. (FOR ICD-9 250.00, 250.01). 1 each 0    Objective: BP (!) 155/90 (BP Location: Left Arm)   Pulse 87   Temp 99.1 F (37.3 C) (Oral)   Resp 17   Ht 5\' 6"  (1.676 m)   Wt 77.2 kg   SpO2 96%   BMI 27.47 kg/m  Exam: General: NAD Eyes: Scleral icterus, negative discharge, PERRL ENTM: Moist mucous membranes, negative erythema or edema of oropharynx, positive halitosis and poor dentition Neck: Positive cervical lymphadenitis without tenderness to palpation, negative  thyromegaly Cardiovascular: Rapid heart rate regular rate, normal S1, S2.  No murmurs Respiratory: Normal work of breathing, CTAB Gastrointestinal: Soft, negative distention, negative hepatomegaly, negative fluid wave Tenderness to palpation of epigastric and left upper quadrant MSK: Normal development Derm: No rashes or lesions Neuro: Alert and oriented x4, no focal deficits, positive asterixis Psych: Normal affect and mood  Labs and Imaging: CBC BMET  Recent Labs  Lab 05/07/20 0832  WBC 8.5  HGB 18.1*  HCT 48.8  PLT 263   Recent Labs  Lab 05/07/20 0832  NA 130*  K 4.1  CL 95*  CO2 20*  BUN 14  CREATININE 0.69  GLUCOSE 192*  CALCIUM 7.7*     EKG: pending  CT ABDOMEN PELVIS W CONTRAST  Result Date: 05/07/2020 CLINICAL DATA:  Sharp left lower quadrant abdominal pain for 2 days, nausea and vomiting EXAM: CT ABDOMEN AND PELVIS WITH CONTRAST TECHNIQUE: Multidetector CT imaging of the abdomen and pelvis was performed using the standard protocol following bolus administration of intravenous contrast. CONTRAST:  05/09/2020 OMNIPAQUE IOHEXOL 300 MG/ML  SOLN COMPARISON:  None. FINDINGS: Lower chest: No acute pleural or parenchymal lung disease. Hepatobiliary: Diffuse hepatic steatosis without focal abnormality. Gallbladder is moderately distended without cholelithiasis or cholecystitis. No biliary dilation. Pancreas: Unremarkable. No pancreatic ductal dilatation or surrounding inflammatory changes. Spleen: Normal in size without focal abnormality. Adrenals/Urinary Tract: Adrenal glands are unremarkable. Kidneys are normal, without renal calculi, focal lesion, or hydronephrosis. Bladder is unremarkable. Stomach/Bowel: No bowel obstruction or ileus. Normal appendix right lower quadrant. No bowel wall thickening or inflammatory change. Vascular/Lymphatic: No significant vascular findings are present. No enlarged abdominal or pelvic lymph nodes. Reproductive: Prostate is unremarkable. Other: No  free fluid or free gas. No abdominal wall hernia. Musculoskeletal: There are no acute or destructive bony lesions. Multilevel spondylosis and facet hypertrophy throughout the lumbar spine most pronounced at L4-5. Reconstructed images demonstrate no additional findings. IMPRESSION: 1. No acute intra-abdominal or intrapelvic process. 2. Hepatic steatosis. Electronically Signed   By: M.D.   On: 05/07/2020 16:40   05/09/2020 Abdomen Limited  Result Date: 05/07/2020 CLINICAL DATA:  Abdominal pain. EXAM: ULTRASOUND ABDOMEN LIMITED RIGHT UPPER QUADRANT COMPARISON:  CT from the same day FINDINGS: Gallbladder: No gallstones or wall thickening visualized. No sonographic Murphy sign noted by sonographer. Common bile duct: Diameter: 3 mm Liver: Diffuse increased echogenicity with slightly heterogeneous liver. Appearance typically secondary to fatty infiltration. Fibrosis secondary consideration. No secondary findings of cirrhosis noted. No focal hepatic lesion or intrahepatic biliary duct dilatation. Portal vein is patent on color Doppler imaging with normal direction of blood flow towards the liver. Other: None. IMPRESSION: 1. No acute abnormality. 2. Hepatic steatosis. Electronically Signed   By: 05/09/2020 M.D.   On: 05/07/2020 19:01    05/09/2020, DO 05/08/2020, 1:07 AM PGY-3, [place]  [place] Intern pager: [phone], text pages welcome

## 2020-05-07 NOTE — ED Provider Notes (Signed)
[facility] EMERGENCY DEPARTMENT Provider Note   CSN: [id number] Arrival date & time: 05/07/20  0805     History Chief Complaint  Patient presents with  . Abdominal Pain    Derek Cruz is a 54 y.o. male.  The history is provided by the patient and the spouse. The history is limited by a language barrier. A language interpreter was used.  Abdominal Pain Pain location:  LLQ Pain quality: aching   Pain radiates to:  Does not radiate Pain severity:  Moderate Onset quality:  Gradual Timing:  Constant Progression:  Unchanged Chronicity:  New Context: alcohol use   Context: not diet changes, not eating, not previous surgeries, not recent illness, not sick contacts and not suspicious food intake   Relieved by:  Nothing Worsened by:  Nothing Associated symptoms: anorexia   Associated symptoms: no chest pain, no chills, no constipation, no cough, no diarrhea, no dysuria, no fever, no nausea, no shortness of breath and no vomiting        Past Medical History:  Diagnosis Date  . Chest pain   . Dyslipidemia   . Gastritis   . Hypertension     Patient Active Problem List   Diagnosis Date Noted  . Hemoglobin A1c less than 7.0% 07/16/2019  . Neck pain 03/31/2019  . Muscle strain 03/31/2019  . Generalized abdominal pain 03/31/2019  . Gastroesophageal reflux disease without esophagitis 11/24/2018  . Chest discomfort 11/24/2018  . Essential hypertension 11/24/2018  . Pre-diabetes 11/24/2018  . Dyslipidemia 10/06/2016  . Chest pain 09/26/2016  . Diabetes mellitus type 2, controlled ([place]) 09/26/2016    History reviewed. No pertinent surgical history.     Family History  Problem Relation Age of Onset  . Diabetes Mellitus II Neg Hx     Social History   Tobacco Use  . Smoking status: Former Research scientist (life sciences)  . Smokeless tobacco: Never Used  Vaping Use  . Vaping Use: Never used  Substance Use Topics  . Alcohol use: Yes  . Drug use: No     Home Medications Prior to Admission medications   Medication Sig Start Date End Date Taking? Authorizing Provider  aspirin 81 MG tablet Take 1 tablet (81 mg total) by mouth daily. 01/31/17   Scot Jun, FNP  blood glucose meter kit and supplies KIT Dispense based on patient and insurance preference. Use up to four times daily as directed. (FOR ICD-9 250.00, 250.01). 09/26/16   Geradine Girt, DO  clotrimazole (LOTRIMIN) 1 % cream Apply 1 application topically 2 (two) times daily. 01/04/20   Azzie Glatter, FNP  diclofenac sodium (VOLTAREN) 1 % GEL Apply 4 g topically 4 (four) times daily. 03/30/19   Azzie Glatter, FNP  hydrocortisone cream 1 % Apply to affected area 2 times daily Patient not taking: Reported on 01/04/2020 12/26/18   Azzie Glatter, FNP  ibuprofen (ADVIL) 800 MG tablet Take 1 tablet (800 mg total) by mouth every 8 (eight) hours as needed. Patient not taking: Reported on 01/04/2020 12/26/18   Azzie Glatter, FNP  lisinopril (ZESTRIL) 5 MG tablet Take 1 tablet (5 mg total) by mouth daily. 11/24/18   Azzie Glatter, FNP  metFORMIN (GLUCOPHAGE) 1000 MG tablet Take 1 tablet with meals twice daily. 11/24/18   Azzie Glatter, FNP  omeprazole (PRILOSEC) 20 MG capsule Take 1 capsule (20 mg total) by mouth daily. 11/24/18   Azzie Glatter, FNP  pravastatin (PRAVACHOL) 40 MG tablet Take 1 tablet (  40 mg total) by mouth daily. 12/26/18   Azzie Glatter, FNP    Allergies    Patient has no known allergies.  Review of Systems   Review of Systems  Constitutional: Negative for chills and fever.  HENT: Negative for congestion and rhinorrhea.   Respiratory: Negative for cough and shortness of breath.   Cardiovascular: Negative for chest pain and palpitations.  Gastrointestinal: Positive for abdominal distention, abdominal pain and anorexia. Negative for constipation, diarrhea, nausea and vomiting.  Genitourinary: Negative for difficulty urinating and dysuria.   Musculoskeletal: Negative for arthralgias and back pain.  Skin: Negative for color change and rash.  Neurological: Negative for light-headedness and headaches.    Physical Exam Updated Vital Signs BP (!) 139/94   Pulse 93   Temp 98.3 F (36.8 C) (Oral)   Resp 13   Wt 76.2 kg   SpO2 97%   BMI 27.12 kg/m   Physical Exam Vitals and nursing note reviewed. Exam conducted with a chaperone present.  Constitutional:      General: He is not in acute distress.    Appearance: Normal appearance.  HENT:     Head: Normocephalic and atraumatic.     Nose: No rhinorrhea.  Eyes:     General:        Right eye: No discharge.        Left eye: No discharge.     Conjunctiva/sclera: Conjunctivae normal.  Cardiovascular:     Rate and Rhythm: Normal rate and regular rhythm.  Pulmonary:     Effort: Pulmonary effort is normal.     Breath sounds: No stridor.  Abdominal:     General: Abdomen is flat. There is no distension.     Palpations: Abdomen is soft.     Tenderness: There is abdominal tenderness in the suprapubic area and left lower quadrant. There is no right CVA tenderness, guarding or rebound. Negative signs include Murphy's sign.  Musculoskeletal:        General: No deformity or signs of injury.  Skin:    General: Skin is warm and dry.  Neurological:     General: No focal deficit present.     Mental Status: He is alert. Mental status is at baseline.     Motor: No weakness.  Psychiatric:        Mood and Affect: Mood normal.        Behavior: Behavior normal.        Thought Content: Thought content normal.     ED Results / Procedures / Treatments   Labs (all labs ordered are listed, but only abnormal results are displayed) Labs Reviewed  COMPREHENSIVE METABOLIC PANEL - Abnormal; Notable for the following components:      Result Value   Sodium 130 (*)    Chloride 95 (*)    CO2 20 (*)    Glucose, Bld 192 (*)    Calcium 7.7 (*)    AST 1,944 (*)    ALT 1,092 (*)    Total  Bilirubin 1.7 (*)    All other components within normal limits  CBC - Abnormal; Notable for the following components:   Hemoglobin 18.1 (*)    MCHC 37.1 (*)    All other components within normal limits  LIPASE, BLOOD  URINALYSIS, ROUTINE W REFLEX MICROSCOPIC  HEPATITIS PANEL, ACUTE  PROTIME-INR  LACTIC ACID, PLASMA  LACTIC ACID, PLASMA    EKG None  Radiology No results found.  Procedures Procedures (including critical care time)  Medications  Ordered in ED Medications  morphine 4 MG/ML injection 4 mg (has no administration in time range)  sodium chloride 0.9 % bolus 1,000 mL (has no administration in time range)    ED Course  I have reviewed the triage vital signs and the nursing notes.  Pertinent labs & imaging results that were available during my care of the patient were reviewed by me and considered in my medical decision making (see chart for details).    MDM Rules/Calculators/A&P                          Patient had 2 days of worsening left abdominal pain, worse in the left lower quadrant. Normal bowel function. Decreased appetite no nausea vomiting no fevers. He says he is very bloated and feels very gaseous. Flatus and bowel movements no change in symptoms. Burping also does not relieve symptoms, patient has had decreased appetite but no nausea vomiting. No fevers, no sick contacts. No recent changes to medications, has been on Metformin for years. On exam his significant tenderness, review of his labs by myself show significant ALT AST elevation, 2:1 ratio consistent with alcohol consumption, this patient says he drinks 6 large beers a day for the last week. Other laboratory studies are fairly unremarkable at this time other than some mild hyponatremia. He will be given IV fluids he will get CT scan to evaluate left lower quadrant tenderness, and he will get further lab studies regarding his liver.  Pt care was handed off to on coming provider at 1530.  Complete  history and physical and current plan have been communicated.  Please refer to their note for the remainder of ED care and ultimate disposition.  Pt seen in conjunction with Dr. Colvin Caroli   Final Clinical Impression(s) / ED Diagnoses Final diagnoses:  Undifferentiated abdominal pain  Alcoholic hepatitis, unspecified whether ascites present    Rx / DC Orders ED Discharge Orders    None       Breck Coons, MD 05/07/20 [phone]

## 2020-05-07 NOTE — ED Triage Notes (Addendum)
Pt reports sharp L sided abd pain x 2 days with nausea and vomiting.  Denies diarrhea.  ETOH last night.

## 2020-05-08 LAB — HIV ANTIBODY (ROUTINE TESTING W REFLEX): HIV Screen 4th Generation wRfx: NONREACTIVE

## 2020-05-08 LAB — CBC
HCT: 41.2 % (ref 39.0–52.0)
Hemoglobin: 15 g/dL (ref 13.0–17.0)
MCH: 32.3 pg (ref 26.0–34.0)
MCHC: 36.4 g/dL — ABNORMAL HIGH (ref 30.0–36.0)
MCV: 88.6 fL (ref 80.0–100.0)
Platelets: 198 10*3/uL (ref 150–400)
RBC: 4.65 MIL/uL (ref 4.22–5.81)
RDW: 13 % (ref 11.5–15.5)
WBC: 7.6 10*3/uL (ref 4.0–10.5)
nRBC: 0 % (ref 0.0–0.2)

## 2020-05-08 LAB — TSH: TSH: 2.996 u[IU]/mL (ref 0.350–4.500)

## 2020-05-08 LAB — COMPREHENSIVE METABOLIC PANEL
ALT: 849 U/L — ABNORMAL HIGH (ref 0–44)
AST: 1074 U/L — ABNORMAL HIGH (ref 15–41)
Albumin: 3.2 g/dL — ABNORMAL LOW (ref 3.5–5.0)
Alkaline Phosphatase: 96 U/L (ref 38–126)
Anion gap: 12 (ref 5–15)
BUN: 11 mg/dL (ref 6–20)
CO2: 20 mmol/L — ABNORMAL LOW (ref 22–32)
Calcium: 7.3 mg/dL — ABNORMAL LOW (ref 8.9–10.3)
Chloride: 102 mmol/L (ref 98–111)
Creatinine, Ser: 0.7 mg/dL (ref 0.61–1.24)
GFR, Estimated: >60 mL/min (ref >60)
Glucose, Bld: 139 mg/dL — ABNORMAL HIGH (ref 70–99)
Potassium: 3.2 mmol/L — ABNORMAL LOW (ref 3.5–5.1)
Sodium: 134 mmol/L — ABNORMAL LOW (ref 135–145)
Total Bilirubin: 1.9 mg/dL — ABNORMAL HIGH (ref 0.3–1.2)
Total Protein: 6.1 g/dL — ABNORMAL LOW (ref 6.5–8.1)

## 2020-05-08 LAB — GLUCOSE, CAPILLARY
Glucose-Capillary: 108 mg/dL — ABNORMAL HIGH (ref 70–99)
Glucose-Capillary: 48 mg/dL — ABNORMAL LOW (ref 70–99)
Glucose-Capillary: 165 mg/dL — ABNORMAL HIGH (ref 70–99)
Glucose-Capillary: 118 mg/dL — ABNORMAL HIGH (ref 70–99)
Glucose-Capillary: 123 mg/dL — ABNORMAL HIGH (ref 70–99)

## 2020-05-08 LAB — LACTIC ACID, PLASMA
Lactic Acid, Venous: 1.5 mmol/L (ref 0.5–1.9)
Lactic Acid, Venous: 1.3 mmol/L (ref 0.5–1.9)

## 2020-05-08 LAB — PROTIME-INR
INR: 1.3 — ABNORMAL HIGH (ref 0.8–1.2)
Prothrombin Time: 15.8 seconds — ABNORMAL HIGH (ref 11.4–15.2)

## 2020-05-08 LAB — OCCULT BLOOD X 1 CARD TO LAB, STOOL: Fecal Occult Bld: NEGATIVE

## 2020-05-08 LAB — HEMOGLOBIN A1C
Hgb A1c MFr Bld: 6.9 % — ABNORMAL HIGH (ref 4.8–5.6)
Mean Plasma Glucose: 151.33 mg/dL

## 2020-05-08 MED ORDER — POTASSIUM CHLORIDE CRYS ER 20 MEQ PO TBCR
40.0000 meq | EXTENDED_RELEASE_TABLET | Freq: Once | ORAL | Status: AC
  Administered 2020-05-08: 13:00:00 40 meq via ORAL
  Filled 2020-05-08: qty 2

## 2020-05-08 NOTE — Progress Notes (Signed)
Family Medicine Teaching Service Daily Progress Note Intern Pager: [phone]  Patient name: Derek Cruz Medical record number: [id number] Date of birth: 1965/10/02 Age: 54 y.o. Gender: male  Primary Care Provider: Kallie Locks, FNP Consultants: GI Code Status: Full   Pt Overview and Major Events to Date:  12/18: Admitted  Assessment and Plan:  Derek Cruz is a 54 y.o. male presenting with abdominal pain with concern for gastritis and acute hepatitis. PMH is significant for T2DM, GERD, HTN, HLD, Alcohol Use Disorder.   LUQ pain likely 2/2 gastritis  Abdominal pain is improving since admission. Denies nausea or vomiting. On exam tender in LUQ and LLQ, no guarding. Pain is likely due to gastritis secondary to alcohol use.  ETOH history as below. LA 3.4>3.4>1.5 -GI consult appreciate recs -Trend LA  -mIVF LR 200 cc/hour  -IV protonix 40mg  BID  -F/u FOBT  Hepatitis 2/2 EOTH use  AST and ALT at 1092>849. ALP 96.  Total bilirubin also elevated at 1.7>1.9. Platelets 198. RUQ abdominal ultrasound showed "diffuse increased echogenicity, hepatic steatosis without acute abnormality, portal vein patent, normal-appearing gallbladder without stones or wall thickening".  -GI consulted, appreciate recs -IV Protonix 40mg  BID -Avoid hepatotoxic meds -Lactulose 20g daily  -Monitor I's and O's -PT/INR am -CBC am  Alcohol Use Most recent CIWAs 1,1,1, 2, 6,4 He still feels shaky but better compared to yesterday. Patient has a history of withdrawal tremors and possible seizures. He drinks 4-5 24oz beers per day and had last drink was 12/17 at midnight. He reports that he has experienced alcohol withdrawal before 20 years ago.  -Continue CIWA protocol -Low threshold to transfer to ICU -TOC referral prior to dc -Thiamine 100 mg daily -Folic acid 1 mg daily  T2DM HbA1c 9.8% (Aug 2021), A1c 6.9% this morning  Home meds: Metformin 1000mg  daily -Carb  modified diet -CBGs 4 times daily before meals and at bedtime -sSSI - Hold metformin  Hypokalemia  K 3.2 this morning -Monitor with BMP -Repleted with   HTN BP 139/96 Home meds: Lisinopril 5mg  daily -Continue Lisinopril 5 mg daily -Monitor blood pressures  Hyponatremia Na 134 today, improved from 130 on admission. Likely related to dehydration, hyperglycemia and alcohol use. -Monitor with daily CMP  Dyslipidemia Lipid panel from February 2021:cholesterol 249, triglycerides 626, HDL low at 34, and LDL 107.   Home meds: pravastatin 40 mg daily and aspirin 81 g daily outpatient.  Patient's ASCVD risk 25.6%. -Hold pravastatin due to acute hepatitis   FEN/GI: full liquid, protonix Prophylaxis: lovenox   Status is: Inpatient  Remains inpatient appropriate because:Persistent severe electrolyte disturbances, Ongoing diagnostic testing needed not appropriate for outpatient work up, Unsafe d/c plan, IV treatments appropriate due to intensity of illness or inability to take PO and Inpatient level of care appropriate due to severity of illness   Dispo: The patient is from: Home              Anticipated d/c is to: Home              Anticipated d/c date is: > 3 days              Patient currently is not medically stable to d/c.    Subjective:  Patient is feeling better compared to on admission his abdominal pain is improving.  He reports he heavily last week-4 to 524 ounce beers a day.  His last drink was 5/17 at midnight.  He reports that he has experienced alcohol withdrawal  before 20 years ago.    Objective: Temp:  [98.2 F (36.8 C)-99.1 F (37.3 C)] 98.4 F (36.9 C) (12/19 0800) Pulse Rate:  [73-103] 80 (12/19 0800) Resp:  [13-21] 17 (12/19 0800) BP: (111-156)/(77-120) 139/96 (12/19 0800) SpO2:  [94 %-99 %] 98 % (12/19 0800) Weight:  [76.2 kg-77.2 kg] 77.2 kg (12/19 0022)  Physical Exam: General: Appears stated age, comfortable, sitting up in bed, shaking slightly.  Mild scleral icterus  Cardiovascular: S1 and S2 present, RRR Respiratory: CTAB, normal work of breathing Abdomen: No distention, mild tenderness on palpation of left lower quadrant and left upper quadrant, no guarding, bowel sounds present Extremities: No peripheral edema Neuro: Asterixis present  Laboratory: Recent Labs  Lab 05/07/20 0832 05/08/20 0105  WBC 8.5 7.6  HGB 18.1* 15.0  HCT 48.8 41.2  PLT 263 198   Recent Labs  Lab 05/07/20 0832 05/08/20 0105  NA 130* 134*  K 4.1 3.2*  CL 95* 102  CO2 20* 20*  BUN 14 11  CREATININE 0.69 0.70  CALCIUM 7.7* 7.3*  PROT 7.1 6.1*  BILITOT 1.7* 1.9*  ALKPHOS 96 96  ALT 1,092* 849*  AST 1,944* 1,074*  GLUCOSE 192* 139*     Imaging/Diagnostic Tests: CT ABDOMEN PELVIS W CONTRAST  Result Date: 05/07/2020 CLINICAL DATA:  Sharp left lower quadrant abdominal pain for 2 days, nausea and vomiting EXAM: CT ABDOMEN AND PELVIS WITH CONTRAST TECHNIQUE: Multidetector CT imaging of the abdomen and pelvis was performed using the standard protocol following bolus administration of intravenous contrast. CONTRAST:  OMNIPAQUE IOHEXOL 300 MG/ML  SOLN COMPARISON:  None. FINDINGS: Lower chest: No acute pleural or parenchymal lung disease. Hepatobiliary: Diffuse hepatic steatosis without focal abnormality. Gallbladder is moderately distended without cholelithiasis or cholecystitis. No biliary dilation. Pancreas: Unremarkable. No pancreatic ductal dilatation or surrounding inflammatory changes. Spleen: Normal in size without focal abnormality. Adrenals/Urinary Tract: Adrenal glands are unremarkable. Kidneys are normal, without renal calculi, focal lesion, or hydronephrosis. Bladder is unremarkable. Stomach/Bowel: No bowel obstruction or ileus. Normal appendix right lower quadrant. No bowel wall thickening or inflammatory change. Vascular/Lymphatic: No significant vascular findings are present. No enlarged abdominal or pelvic lymph nodes. Reproductive:  Prostate is unremarkable. Other: No free fluid or free gas. No abdominal wall hernia. Musculoskeletal: There are no acute or destructive bony lesions. Multilevel spondylosis and facet hypertrophy throughout the lumbar spine most pronounced at L4-5. Reconstructed images demonstrate no additional findings. IMPRESSION: 1. No acute intra-abdominal or intrapelvic process. 2. Hepatic steatosis. Electronically Signed   By: Sharlet Salina M.D.   On: 05/07/2020 16:40   US Abdomen Limited  Result Date: 05/07/2020 CLINICAL DATA:  Abdominal pain. EXAM: ULTRASOUND ABDOMEN LIMITED RIGHT UPPER QUADRANT COMPARISON:  CT from the same day FINDINGS: Gallbladder: No gallstones or wall thickening visualized. No sonographic Murphy sign noted by sonographer. Common bile duct: Diameter: 3 mm Liver: Diffuse increased echogenicity with slightly heterogeneous liver. Appearance typically secondary to fatty infiltration. Fibrosis secondary consideration. No secondary findings of cirrhosis noted. No focal hepatic lesion or intrahepatic biliary duct dilatation. Portal vein is patent on color Doppler imaging with normal direction of blood flow towards the liver. Other: None. IMPRESSION: 1. No acute abnormality. 2. Hepatic steatosis. Electronically Signed   By: Katherine Mantle M.D.   On: 05/07/2020 19:01    Towanda Octave, MD 05/08/2020, 8:04 AM PGY-2, [facility] FPTS Intern pager: [phone], text pages welcome

## 2020-05-08 NOTE — Consult Note (Signed)
UNASSIGNED PATIENT CROSS COVER [facility] Reason for Consult: Normal LFTs with left upper quadrant pain. Referring Physician: [facility] hospitalist.  Derek Cruz is an 54 y.o. male.  HPI: Patient is a 54 year old Hispanic man, who presented to the ED [place] yesterday with some left upper quadrant pain.  He had been drinking quite a bit over the last week celebrating the holidays and was noted to have elevated liver enzymes on evaluation in the ER with a total bili of 1.7 AST of 1944 and ALT of 1092 creatinine was 0.69.  Been having some chills with abdominal pain but denies having any documented fevers.  It somewhat difficult to get history from him due to a language barrier.  Denies a history of any melena or hematochezia.  He denies previous problems with liver disease.  He has a good appetite and his weights been stable.  He had a CT scan of the abdomen pelvis with contrast on either ER that showed hepatic steatosis with no evidence of acute intra-abdominal or intrapelvic process; multiple levels of spondylosis and facet hypertrophy throughout the lumbar spine was noted both before and pronounced at L4-L5 levels; an abdominal ultrasound done in the ER after the CT scan with again hepatic steatosis was noted but no acute abdominal abnormalities were identified.  He admits to taking ibuprofen for aches and pains in the past.  He denies any having any problems with constipation or diarrhea, melena or hematochezia.  Past Medical History:  Diagnosis Date  . Chest pain   . Dyslipidemia   . Gastritis   . Hypertension    History reviewed. No pertinent surgical history.  Family History  Problem Relation Age of Onset  . Diabetes Mellitus II Neg Hx    Social History:  reports that he has quit smoking. He has never used smokeless tobacco. He reports current alcohol use. He reports that he does not use drugs.  Allergies: No Known Allergies  Medications: I have reviewed the patient's current  medications.  Results for orders placed or performed during the hospital encounter of 05/07/20 (from the past 48 hour(s))  Lipase, blood     Status: None   Collection Time: 05/07/20  8:32 AM  Result Value Ref Range   Lipase 24 11 - 51 U/L    Comment: Performed at [facility], [address], [place], [place] [address]  Comprehensive metabolic panel     Status: Abnormal   Collection Time: 05/07/20  8:32 AM  Result Value Ref Range   Sodium 130 (L) 135 - 145 mmol/L   Potassium 4.1 3.5 - 5.1 mmol/L    Comment: SLIGHT HEMOLYSIS   Chloride 95 (L) 98 - 111 mmol/L   CO2 20 (L) 22 - 32 mmol/L   Glucose, Bld 192 (H) 70 - 99 mg/dL    Comment: Glucose reference range applies only to samples taken after fasting for at least 8 hours.   BUN 14 6 - 20 mg/dL   Creatinine, Ser 2.37 0.61 - 1.24 mg/dL   Calcium 7.7 (L) 8.9 - 10.3 mg/dL   Total Protein 7.1 6.5 - 8.1 g/dL   Albumin 3.8 3.5 - 5.0 g/dL   AST 6,283 (H) 15 - 41 U/L   ALT 1,092 (H) 0 - 44 U/L   Alkaline Phosphatase 96 38 - 126 U/L   Total Bilirubin 1.7 (H) 0.3 - 1.2 mg/dL   GFR, Estimated >15 >17 mL/min    Comment: (NOTE) Calculated using the CKD-EPI Creatinine Equation (2021)  Anion gap 15 5 - 15    Comment: Performed at [facility], [address] [place], [place] [address]  CBC     Status: Abnormal   Collection Time: 05/07/20  8:32 AM  Result Value Ref Range   WBC 8.5 4.0 - 10.5 K/uL   RBC 5.64 4.22 - 5.81 MIL/uL   Hemoglobin 18.1 (H) 13.0 - 17.0 g/dL   HCT 62.1 30.8 - 65.7 %   MCV 86.5 80.0 - 100.0 fL   MCH 32.1 26.0 - 34.0 pg   MCHC 37.1 (H) 30.0 - 36.0 g/dL   RDW 84.6 96.2 - 95.2 %   Platelets 263 150 - 400 K/uL   nRBC 0.0 0.0 - 0.2 %    Comment: Performed at [facility], [address], [place], [place] [address]  Hepatitis panel, acute     Status: None   Collection Time: 05/07/20  2:57 PM  Result Value Ref Range   Hepatitis B Surface Ag NON REACTIVE NON REACTIVE   HCV Ab NON REACTIVE NON  REACTIVE    Comment: (NOTE) Nonreactive HCV antibody screen is consistent with no HCV infections,  unless recent infection is suspected or other evidence exists to indicate HCV infection.     Hep A IgM NON REACTIVE NON REACTIVE   Hep B C IgM NON REACTIVE NON REACTIVE    Comment: Performed at [facility], [address], [place], [place] [address]  Protime-INR     Status: Abnormal   Collection Time: 05/07/20  2:57 PM  Result Value Ref Range   Prothrombin Time 17.1 (H) 11.4 - 15.2 seconds   INR 1.4 (H) 0.8 - 1.2    Comment: (NOTE) INR goal varies based on device and disease states. Performed at [facility], [address] [place], [place] [address]   Lactic acid, plasma     Status: Abnormal   Collection Time: 05/07/20  2:57 PM  Result Value Ref Range   Lactic Acid, Venous 2.9 (HH) 0.5 - 1.9 mmol/L    Comment: CRITICAL RESULT CALLED TO, READ BACK BY AND VERIFIED WITH: ZOE CAGLE RN.@[email or web address]  ON 12.18.21 BY TCALDWELL MT. Performed at [facility], [address], [place], [place] [address]   Lactic acid, plasma     Status: Abnormal   Collection Time: 05/07/20  5:25 PM  Result Value Ref Range   Lactic Acid, Venous 3.4 (HH) 0.5 - 1.9 mmol/L    Comment: CRITICAL VALUE NOTED.  VALUE IS CONSISTENT WITH PREVIOUSLY REPORTED AND CALLED VALUE. Performed at [facility], [address], [place], [place] [address]   Urinalysis, Routine w reflex microscopic     Status: Abnormal   Collection Time: 05/07/20  7:28 PM  Result Value Ref Range   Color, Urine YELLOW YELLOW   APPearance CLEAR CLEAR   Specific Gravity, Urine 1.038 (H) 1.005 - 1.030   pH 6.0 5.0 - 8.0   Glucose, UA NEGATIVE NEGATIVE mg/dL   Hgb urine dipstick NEGATIVE NEGATIVE   Bilirubin Urine NEGATIVE NEGATIVE   Ketones, ur NEGATIVE NEGATIVE mg/dL   Protein, ur 30 (A) NEGATIVE mg/dL   Nitrite NEGATIVE NEGATIVE   Leukocytes,Ua NEGATIVE NEGATIVE   RBC / HPF 0-5 0 - 5 RBC/hpf   WBC, UA 0-5 0 - 5 WBC/hpf    Bacteria, UA NONE SEEN NONE SEEN   Mucus PRESENT     Comment: Performed at [facility], [address], [place],  [place] [address]  POC CBG, ED     Status: Abnormal   Collection Time: 05/07/20  7:35 PM  Result Value Ref Range   Glucose-Capillary 134 (H) 70 - 99 mg/dL    Comment: Glucose reference range applies only to samples taken after fasting for at least 8 hours.  Lactic acid, plasma     Status: Abnormal   Collection Time: 05/07/20  8:40 PM  Result Value Ref Range   Lactic Acid, Venous 3.4 (HH) 0.5 - 1.9 mmol/L    Comment: CRITICAL VALUE NOTED.  VALUE IS CONSISTENT WITH PREVIOUSLY REPORTED AND CALLED VALUE. Performed at [facility], [address] [place], [place] [address]   Resp Panel by RT-PCR (Flu A&B, Covid) Nasopharyngeal Swab     Status: None   Collection Time: 05/07/20 10:05 PM   Specimen: Nasopharyngeal Swab; Nasopharyngeal(NP) swabs in vial transport medium  Result Value Ref Range   SARS Coronavirus 2 by RT PCR NEGATIVE NEGATIVE    Comment: (NOTE) SARS-CoV-2 target nucleic acids are NOT DETECTED.  The SARS-CoV-2 RNA is generally detectable in upper respiratory specimens during the acute phase of infection. The lowest concentration of SARS-CoV-2 viral copies this assay can detect is 138 copies/mL. A negative result does not preclude SARS-Cov-2 infection and should not be used as the sole basis for treatment or other patient management decisions. A negative result may occur with  improper specimen collection/handling, submission of specimen other than nasopharyngeal swab, presence of viral mutation(s) within the areas targeted by this assay, and inadequate number of viral copies(<138 copies/mL). A negative result must be combined with clinical observations, patient history, and epidemiological information. The expected result is Negative.  Fact Sheet for Patients:  BloggerCourse.com  Fact Sheet for Healthcare Providers:   SeriousBroker.it  This test is no t yet approved or cleared by the [place] FDA and  has been authorized for detection and/or diagnosis of SARS-CoV-2 by FDA under an Emergency Use Authorization (EUA). This EUA will remain  in effect (meaning this test can be used) for the duration of the COVID-19 declaration under Section 564(b)(1) of the Act, 21 U.S.C.section 360bbb-3(b)(1), unless the authorization is terminated  or revoked sooner.       Influenza A by PCR NEGATIVE NEGATIVE   Influenza B by PCR NEGATIVE NEGATIVE    Comment: (NOTE) The Xpert Xpress SARS-CoV-2/FLU/RSV plus assay is intended as an aid in the diagnosis of influenza from Nasopharyngeal swab specimens and should not be used as a sole basis for treatment. Nasal washings and aspirates are unacceptable for Xpert Xpress SARS-CoV-2/FLU/RSV testing.  Fact Sheet for Patients: BloggerCourse.com  Fact Sheet for Healthcare Providers: SeriousBroker.it  This test is not yet approved or cleared by the [place] FDA and has been authorized for detection and/or diagnosis of SARS-CoV-2 by FDA under an Emergency Use Authorization (EUA). This EUA will remain in effect (meaning this test can be used) for the duration of the COVID-19 declaration under Section 564(b)(1) of the Act, 21 U.S.C. section 360bbb-3(b)(1), unless the authorization is terminated or revoked.  Performed at [facility], [address], [place], [place] [address]   HIV Antibody (routine testing w rflx)     Status: None   Collection Time: 05/07/20 11:43 PM  Result Value Ref Range   HIV Screen 4th Generation wRfx Non Reactive Non Reactive    Comment: Performed at [facility], [address] [place], [place] [address]  Protime-INR     Status: Abnormal  Collection Time: 05/08/20  1:05 AM  Result Value Ref Range   Prothrombin Time 15.8 (H) 11.4 - 15.2 seconds    INR 1.3 (H) 0.8 - 1.2    Comment: (NOTE) INR goal varies based on device and disease states. Performed at [facility], [address] [place], [place] [address]   Comprehensive metabolic panel     Status: Abnormal   Collection Time: 05/08/20  1:05 AM  Result Value Ref Range   Sodium 134 (L) 135 - 145 mmol/L   Potassium 3.2 (L) 3.5 - 5.1 mmol/L   Chloride 102 98 - 111 mmol/L   CO2 20 (L) 22 - 32 mmol/L   Glucose, Bld 139 (H) 70 - 99 mg/dL    Comment: Glucose reference range applies only to samples taken after fasting for at least 8 hours.   BUN 11 6 - 20 mg/dL   Creatinine, Ser 1.910.70 0.61 - 1.24 mg/dL   Calcium 7.3 (L) 8.9 - 10.3 mg/dL   Total Protein 6.1 (L) 6.5 - 8.1 g/dL   Albumin 3.2 (L) 3.5 - 5.0 g/dL   AST 4,7821,074 (H) 15 - 41 U/L   ALT 849 (H) 0 - 44 U/L   Alkaline Phosphatase 96 38 - 126 U/L   Total Bilirubin 1.9 (H) 0.3 - 1.2 mg/dL   GFR, Estimated >95>60 >62>60 mL/min    Comment: (NOTE) Calculated using the CKD-EPI Creatinine Equation (2021)    Anion gap 12 5 - 15    Comment: Performed at [facility], [address] [place], [place] [address]  CBC     Status: Abnormal   Collection Time: 05/08/20  1:05 AM  Result Value Ref Range   WBC 7.6 4.0 - 10.5 K/uL   RBC 4.65 4.22 - 5.81 MIL/uL   Hemoglobin 15.0 13.0 - 17.0 g/dL   HCT 57.841.2 46.939.0 - 62.952.0 %   MCV 88.6 80.0 - 100.0 fL   MCH 32.3 26.0 - 34.0 pg   MCHC 36.4 (H) 30.0 - 36.0 g/dL   RDW 52.813.0 41.311.5 - 24.415.5 %   Platelets 198 150 - 400 K/uL   nRBC 0.0 0.0 - 0.2 %    Comment: Performed at [facility], [address] [place], [place] [address]  Lactic acid, plasma     Status: None   Collection Time: 05/08/20  1:05 AM  Result Value Ref Range   Lactic Acid, Venous 1.5 0.5 - 1.9 mmol/L    Comment: Performed at [facility], [address] [place], [place] [address]  TSH     Status: None   Collection Time: 05/08/20  1:05 AM  Result Value Ref Range   TSH 2.996 0.350 - 4.500 uIU/mL    Comment:  Performed by a 3rd Generation assay with a functional sensitivity of <=0.01 uIU/mL. Performed at [facility], [address] [place], [place] [address]   Hemoglobin A1c     Status: Abnormal   Collection Time: 05/08/20  1:05 AM  Result Value Ref Range   Hgb A1c MFr Bld 6.9 (H) 4.8 - 5.6 %    Comment: (NOTE) Pre diabetes:          5.7%-6.4%  Diabetes:              >6.4%  Glycemic control for   <7.0% adults with diabetes    Mean Plasma Glucose 151.33 mg/dL    Comment: Performed at [facility], [address] [place], [place] [address]  Glucose, capillary     Status: Abnormal   Collection Time: 05/08/20  6:25 AM  Result Value Ref Range   Glucose-Capillary 48 (L) 70 - 99 mg/dL    Comment: Glucose reference range applies only to samples taken after fasting for at least 8 hours.  Glucose, capillary     Status: Abnormal   Collection Time: 05/08/20  6:26 AM  Result Value Ref Range   Glucose-Capillary 108 (H) 70 - 99 mg/dL    Comment: Glucose reference range applies only to samples taken after fasting for at least 8 hours.  Lactic acid, plasma     Status: None   Collection Time: 05/08/20  7:03 AM  Result Value Ref Range   Lactic Acid, Venous 1.3 0.5 - 1.9 mmol/L    Comment: Performed at [facility], [address] [place], [place] [address]    CT ABDOMEN PELVIS W CONTRAST  Result Date: 05/07/2020 CLINICAL DATA:  Sharp left lower quadrant abdominal pain for 2 days, nausea and vomiting EXAM: CT ABDOMEN AND PELVIS WITH CONTRAST TECHNIQUE: Multidetector CT imaging of the abdomen and pelvis was performed using the standard protocol following bolus administration of intravenous contrast. CONTRAST:  OMNIPAQUE IOHEXOL 300 MG/ML  SOLN COMPARISON:  None. FINDINGS: Lower chest: No acute pleural or parenchymal lung disease. Hepatobiliary: Diffuse hepatic steatosis without focal abnormality. Gallbladder is moderately distended without cholelithiasis or cholecystitis. No  biliary dilation. Pancreas: Unremarkable. No pancreatic ductal dilatation or surrounding inflammatory changes. Spleen: Normal in size without focal abnormality. Adrenals/Urinary Tract: Adrenal glands are unremarkable. Kidneys are normal, without renal calculi, focal lesion, or hydronephrosis. Bladder is unremarkable. Stomach/Bowel: No bowel obstruction or ileus. Normal appendix right lower quadrant. No bowel wall thickening or inflammatory change. Vascular/Lymphatic: No significant vascular findings are present. No enlarged abdominal or pelvic lymph nodes. Reproductive: Prostate is unremarkable. Other: No free fluid or free gas. No abdominal wall hernia. Musculoskeletal: There are no acute or destructive bony lesions. Multilevel spondylosis and facet hypertrophy throughout the lumbar spine most pronounced at L4-5. Reconstructed images demonstrate no additional findings. IMPRESSION: 1. No acute intra-abdominal or intrapelvic process. 2. Hepatic steatosis. Electronically Signed   By: Sharlet Salina M.D.   On: 05/07/2020 16:40   US Abdomen Limited  Result Date: 05/07/2020 CLINICAL DATA:  Abdominal pain. EXAM: ULTRASOUND ABDOMEN LIMITED RIGHT UPPER QUADRANT COMPARISON:  CT from the same day FINDINGS: Gallbladder: No gallstones or wall thickening visualized. No sonographic Murphy sign noted by sonographer. Common bile duct: Diameter: 3 mm Liver: Diffuse increased echogenicity with slightly heterogeneous liver. Appearance typically secondary to fatty infiltration. Fibrosis secondary consideration. No secondary findings of cirrhosis noted. No focal hepatic lesion or intrahepatic biliary duct dilatation. Portal vein is patent on color Doppler imaging with normal direction of blood flow towards the liver. Other: None. IMPRESSION: 1. No acute abnormality. 2. Hepatic steatosis. Electronically Signed   By: Katherine Mantle M.D.   On: 05/07/2020 19:01   Review of Systems  Constitutional: Positive for chills and  fatigue. Negative for activity change, appetite change, diaphoresis, fever and unexpected weight change.  HENT: Negative.   Eyes: Negative.   Respiratory: Negative.   Cardiovascular: Negative.   Gastrointestinal: Positive for abdominal pain, nausea and vomiting. Negative for abdominal distention, anal bleeding, blood in stool, constipation, diarrhea and rectal pain.  Endocrine: Negative.   Genitourinary: Negative.   Musculoskeletal: Positive for arthralgias, back pain and neck pain.  Allergic/Immunologic: Negative.   Neurological: Positive for dizziness, light-headedness  and headaches.  Hematological: Negative.   Psychiatric/Behavioral: Negative.    Blood pressure (!) 139/96, pulse 80, temperature 98.4 F (36.9 C), temperature source Oral, resp. rate 17, height 5\' 6"  (1.676 m), weight 77.2 kg, SpO2 98 %. Physical Exam Constitutional:      Appearance: He is well-developed. He is obese.  HENT:     Head: Normocephalic and atraumatic.     Mouth/Throat:     Mouth: Mucous membranes are moist.     Pharynx: Oropharynx is clear.  Eyes:     Extraocular Movements: Extraocular movements intact.     Pupils: Pupils are equal, round, and reactive to light.  Cardiovascular:     Rate and Rhythm: Normal rate and regular rhythm.     Heart sounds: Normal heart sounds.  Pulmonary:     Effort: Pulmonary effort is normal.     Breath sounds: Normal breath sounds.  Abdominal:     General: Abdomen is flat and protuberant. Bowel sounds are normal. There is no distension.     Palpations: Abdomen is soft. There is no shifting dullness, fluid wave, hepatomegaly, splenomegaly or mass.     Tenderness: There is no abdominal tenderness.  Skin:    General: Skin is warm and dry.  Neurological:     General: No focal deficit present.     Mental Status: He is alert and oriented to person, place, and time.  Psychiatric:        Mood and Affect: Mood normal.        Behavior: Behavior normal.    Assessment/Plan: 1) Abnormal LFTs with chills, left upper quadrant pain and hepatic steatosis on CT scan in the setting of alcohol abuse consistent with alcoholic hepatitis.  His LFTs seem to be have improved slightly today.  The importance of abstinence from alcohol and low-fat diet has been discussed with him in great detail.  He is requesting a prescription for medication that might prevent him from drinking. Drugs like Baclofen can be considered prior to discharge.  The patient needs to have a clear understanding of his discharge instructions with plans to follow-up with [facility] for further recommendations and a possible EGD colonoscopy in the near future. 2) GERD on PPI's. 2) Hypertension/Dyslipidemia AODM. 4) History of joint pain back pain arthritis-patient strongly been urged against use of all nonsteroidals.  05/08/2020, 10:09 AM

## 2020-05-08 NOTE — ED Notes (Signed)
Pt admitted to 4E22; report called to Joni Reining, [place].

## 2020-05-09 ENCOUNTER — Telehealth: Payer: Self-pay

## 2020-05-09 ENCOUNTER — Other Ambulatory Visit: Payer: Self-pay

## 2020-05-09 DIAGNOSIS — F1010 Alcohol abuse, uncomplicated: Secondary | ICD-10-CM

## 2020-05-09 DIAGNOSIS — K7010 Alcoholic hepatitis without ascites: Secondary | ICD-10-CM

## 2020-05-09 DIAGNOSIS — R748 Abnormal levels of other serum enzymes: Secondary | ICD-10-CM

## 2020-05-09 LAB — COMPREHENSIVE METABOLIC PANEL
ALT: 555 U/L — ABNORMAL HIGH (ref 0–44)
AST: 406 U/L — ABNORMAL HIGH (ref 15–41)
Albumin: 3.4 g/dL — ABNORMAL LOW (ref 3.5–5.0)
Alkaline Phosphatase: 149 U/L — ABNORMAL HIGH (ref 38–126)
Anion gap: 11 (ref 5–15)
BUN: 8 mg/dL (ref 6–20)
CO2: 20 mmol/L — ABNORMAL LOW (ref 22–32)
Calcium: 8.6 mg/dL — ABNORMAL LOW (ref 8.9–10.3)
Chloride: 101 mmol/L (ref 98–111)
Creatinine, Ser: 0.63 mg/dL (ref 0.61–1.24)
GFR, Estimated: >60 mL/min (ref >60)
Glucose, Bld: 122 mg/dL — ABNORMAL HIGH (ref 70–99)
Potassium: 3.6 mmol/L (ref 3.5–5.1)
Sodium: 132 mmol/L — ABNORMAL LOW (ref 135–145)
Total Bilirubin: 2.2 mg/dL — ABNORMAL HIGH (ref 0.3–1.2)
Total Protein: 6.8 g/dL (ref 6.5–8.1)

## 2020-05-09 LAB — GLUCOSE, CAPILLARY
Glucose-Capillary: 114 mg/dL — ABNORMAL HIGH (ref 70–99)
Glucose-Capillary: 140 mg/dL — ABNORMAL HIGH (ref 70–99)
Glucose-Capillary: 127 mg/dL — ABNORMAL HIGH (ref 70–99)

## 2020-05-09 LAB — PROTIME-INR
INR: 1 (ref 0.8–1.2)
Prothrombin Time: 13 seconds (ref 11.4–15.2)

## 2020-05-09 LAB — IRON AND TIBC
Iron: 164 ug/dL (ref 45–182)
Saturation Ratios: 51 % — ABNORMAL HIGH (ref 17.9–39.5)
TIBC: 319 ug/dL (ref 250–450)
UIBC: 155 ug/dL

## 2020-05-09 LAB — FERRITIN: Ferritin: 1932 ng/mL — ABNORMAL HIGH (ref 24–336)

## 2020-05-09 LAB — CK: Total CK: 131 U/L (ref 49–397)

## 2020-05-09 MED ORDER — COVID-19 MRNA VACCINE (PFIZER) 30 MCG/0.3ML IM SUSP
0.3000 mL | Freq: Once | INTRAMUSCULAR | Status: AC
  Administered 2020-05-09: 18:00:00 0.3 mL via INTRAMUSCULAR
  Filled 2020-05-09: qty 0.3

## 2020-05-09 MED ORDER — FOLIC ACID 1 MG PO TABS: 1.0000 mg | ORAL_TABLET | Freq: Every day | ORAL | 0 refills | Status: DC

## 2020-05-09 MED ORDER — THIAMINE HCL 100 MG PO TABS: 100.0000 mg | ORAL_TABLET | Freq: Every day | ORAL | 0 refills | Status: DC

## 2020-05-09 MED ORDER — PANTOPRAZOLE SODIUM 40 MG PO TBEC: 40.0000 mg | DELAYED_RELEASE_TABLET | Freq: Every day | ORAL | Status: DC

## 2020-05-09 MED ORDER — PANTOPRAZOLE SODIUM 40 MG PO TBEC: 40.0000 mg | DELAYED_RELEASE_TABLET | Freq: Every day | ORAL | 0 refills | Status: DC

## 2020-05-09 MED FILL — PANTOPRAZOLE SOD DR 40 MG T: 40 | 30 days supply | Qty: 30 | Fill #0

## 2020-05-09 MED FILL — FOLIC ACID 1 MG TABS: 1 | 30 days supply | Qty: 30 | Fill #0

## 2020-05-09 NOTE — Evaluation (Signed)
Physical Therapy Evaluation Patient Details Name: Derek Cruz MRN: [id number] DOB: [birth year] Today's Date: 05/09/2020   History of Present Illness  Patient is a 54 y/o male who presents with N/V, left abdominal pain with elevated LFTs. Admitted with Acute hepatitis and  gastritis. PMH includes T2DM, HTN, HLD, Alcohol Use Disorder.  Clinical Impression  Patient reports being independent for ADLs/IADLs and working as a Agricultural engineer. Pt drives and lives with his wife and 3 children (8, 56, 33 year olds). Today, pt tolerated bed mobility, transfers, gait and stair training with Mod I for safety. Reports gait speed is a lot slower than baseline but states it is improving. No evidence of imbalance with head turns, changes in direction. Able to donn socks without difficulty. States no pain today. Pt does not require skilled therapy services as pt functioning close to baseline. Encouraged pt to keep walking while in the hospital. Discharge from therapy.    Follow Up Recommendations No PT follow up    Equipment Recommendations  None recommended by PT    Recommendations for Other Services       Precautions / Restrictions Precautions Precautions: None Restrictions Weight Bearing Restrictions: No      Mobility  Bed Mobility Overal bed mobility: Independent                  Transfers Overall transfer level: Independent Equipment used: None             General transfer comment: Stood without difficulty.  Ambulation/Gait Ambulation/Gait assistance: Modified independent (Device/Increase time) Gait Distance (Feet): 250 Feet Assistive device: None Gait Pattern/deviations: Step-through pattern;Decreased stride length   Gait velocity interpretation: <1.31 ft/sec, indicative of household ambulator General Gait Details: SLow, steady gait, no evidence of imbalance with change in direction, head turns etc.  Stairs Stairs: Yes Stairs assistance: Modified independent  (Device/Increase time) Stair Management: One rail Left;Alternating pattern Number of Stairs: 13 General stair comments: Cues for safety.  Wheelchair Mobility    Modified Rankin (Stroke Patients Only)       Balance Overall balance assessment: Modified Independent                                           Pertinent Vitals/Pain Pain Assessment: No/denies pain    Home Living Family/patient expects to be discharged to:: Private residence Living Arrangements: Spouse/significant other;Children (kids ages 57, 34, 72) Available Help at Discharge: Family;Available PRN/intermittently Type of Home: Mobile home Home Access: Stairs to enter Entrance Stairs-Rails: Right Entrance Stairs-Number of Steps: 4 Home Layout: One level Home Equipment: None      Prior Function Level of Independence: Independent         Comments: Drives, works in [employer].     Hand Dominance   Dominant Hand: Right    Extremity/Trunk Assessment   Upper Extremity Assessment Upper Extremity Assessment: Overall WFL for tasks assessed    Lower Extremity Assessment Lower Extremity Assessment: Defer to PT evaluation;Overall [facility] for tasks assessed    Cervical / Trunk Assessment Cervical / Trunk Assessment: Normal  Communication   Communication: No difficulties  Cognition Arousal/Alertness: Awake/alert Behavior During Therapy: WFL for tasks assessed/performed Overall Cognitive Status: Within Functional Limits for tasks assessed  General Comments: A&Ox4.      General Comments General comments (skin integrity, edema, etc.): Reports feeling slower than normal with regards to walking speed but otherwise okay.    Exercises     Assessment/Plan    PT Assessment Patent does not need any further PT services  PT Problem List         PT Treatment Interventions      PT Goals (Current goals can be found in the Care Plan section)  Acute  Rehab PT Goals Patient Stated Goal: to go home today PT Goal Formulation: All assessment and education complete, DC therapy    Frequency     Barriers to discharge        Co-evaluation               AM-PAC PT "6 Clicks" Mobility  Outcome Measure Help needed turning from your back to your side while in a flat bed without using bedrails?: None Help needed moving from lying on your back to sitting on the side of a flat bed without using bedrails?: None Help needed moving to and from a bed to a chair (including a wheelchair)?: None Help needed standing up from a chair using your arms (e.g., wheelchair or bedside chair)?: None Help needed to walk in hospital room?: None Help needed climbing 3-5 steps with a railing? : None 6 Click Score: 24    End of Session   Activity Tolerance: Patient tolerated treatment well Patient left: in bed;with call bell/phone within reach (sitting EOB eating lunch) Nurse Communication: Mobility status PT Visit Diagnosis: Difficulty in walking, not elsewhere classified (R26.2)    Time: 9509-3267 PT Time Calculation (min) (ACUTE ONLY): 23 min   Charges:   PT Evaluation $PT Eval Moderate Complexity: 1 Mod PT Treatments $Gait Training: 8-22 mins        Vale Haven, PT, DPT Acute Rehabilitation Services Pager [phone] Office [phone]      [name] 05/09/2020, 1:54 PM

## 2020-05-09 NOTE — Progress Notes (Signed)
Daily Rounding Note  05/09/2020, 10:50 AM  LOS: 2 days   SUBJECTIVE:   Chief complaint: Elevated LFTs.  Greenish colored watery stools.  FOBT negative. No nausea or vomiting. Left abdominal pain resolved.  OBJECTIVE:         Vital signs in last 24 hours:    Temp:  [98.1 F (36.7 C)-98.4 F (36.9 C)] 98.3 F (36.8 C) (12/20 0736) Pulse Rate:  [66-80] 74 (12/20 0736) Resp:  [15-21] 16 (12/20 0736) BP: (136-148)/(90-101) 148/101 (12/20 0736) SpO2:  [96 %-100 %] 97 % (12/20 0736) Last BM Date: 05/07/20 Filed Weights   05/07/20 0824 05/08/20 0022  Weight: 76.2 kg 77.2 kg   General: Does not look acutely ill.  Does appear older than stated age. Heart: RRR Chest: Clear bilaterally.  No labored breathing or cough Abdomen: Soft without tenderness.  Active bowel sounds.  No HSM, masses, bruits, hernias Extremities: No CCE. Neuro/Psych: Oriented x3.  No tremors or asterixis.  Moves all 4 limbs without obvious weakness.  Intake/Output from previous day: 12/19 0701 - 12/20 0700 In: 480 [P.O.:480] Out: 700 [Urine:700]  Intake/Output this shift: No intake/output data recorded.  Lab Results: Recent Labs    05/07/20 0832 05/08/20 0105  WBC 8.5 7.6  HGB 18.1* 15.0  HCT 48.8 41.2  PLT 263 198   BMET Recent Labs    05/07/20 0832 05/08/20 0105 05/09/20 0643  NA 130* 134* 132*  K 4.1 3.2* 3.6  CL 95* 102 101  CO2 20* 20* 20*  GLUCOSE 192* 139* 122*  BUN 14 11 8   CREATININE 0.69 0.70 0.63  CALCIUM 7.7* 7.3* 8.6*   LFT Recent Labs    05/07/20 0832 05/08/20 0105 05/09/20 0643  PROT 7.1 6.1* 6.8  ALBUMIN 3.8 3.2* 3.4*  AST 1,944* 1,074* 406*  ALT 1,092* 849* 555*  ALKPHOS 96 96 149*  BILITOT 1.7* 1.9* 2.2*   PT/INR Recent Labs    05/08/20 0105 05/09/20 0643  LABPROT 15.8* 13.0  INR 1.3* 1.0   Hepatitis Panel Recent Labs    05/07/20 1457  HEPBSAG NON REACTIVE  HCVAB NON REACTIVE  HEPAIGM  NON REACTIVE  HEPBIGM NON REACTIVE    Studies/Results: CT ABDOMEN PELVIS W CONTRAST  Result Date: 05/07/2020 CLINICAL DATA:  Sharp left lower quadrant abdominal pain for 2 days, nausea and vomiting EXAM: CT ABDOMEN AND PELVIS WITH CONTRAST TECHNIQUE: Multidetector CT imaging of the abdomen and pelvis was performed using the standard protocol following bolus administration of intravenous contrast. CONTRAST:  128m OMNIPAQUE IOHEXOL 300 MG/ML  SOLN COMPARISON:  None. FINDINGS: Lower chest: No acute pleural or parenchymal lung disease. Hepatobiliary: Diffuse hepatic steatosis without focal abnormality. Gallbladder is moderately distended without cholelithiasis or cholecystitis. No biliary dilation. Pancreas: Unremarkable. No pancreatic ductal dilatation or surrounding inflammatory changes. Spleen: Normal in size without focal abnormality. Adrenals/Urinary Tract: Adrenal glands are unremarkable. Kidneys are normal, without renal calculi, focal lesion, or hydronephrosis. Bladder is unremarkable. Stomach/Bowel: No bowel obstruction or ileus. Normal appendix right lower quadrant. No bowel wall thickening or inflammatory change. Vascular/Lymphatic: No significant vascular findings are present. No enlarged abdominal or pelvic lymph nodes. Reproductive: Prostate is unremarkable. Other: No free fluid or free gas. No abdominal wall hernia. Musculoskeletal: There are no acute or destructive bony lesions. Multilevel spondylosis and facet hypertrophy throughout the lumbar spine most pronounced at L4-5. Reconstructed images demonstrate no additional findings. IMPRESSION: 1. No acute intra-abdominal or intrapelvic process. 2. Hepatic steatosis. Electronically Signed  By: Randa Ngo M.D.   On: 05/07/2020 16:40   US Abdomen Limited  Result Date: 05/07/2020 CLINICAL DATA:  Abdominal pain. EXAM: ULTRASOUND ABDOMEN LIMITED RIGHT UPPER QUADRANT COMPARISON:  CT from the same day FINDINGS: Gallbladder: No gallstones or  wall thickening visualized. No sonographic Murphy sign noted by sonographer. Common bile duct: Diameter: 3 mm Liver: Diffuse increased echogenicity with slightly heterogeneous liver. Appearance typically secondary to fatty infiltration. Fibrosis secondary consideration. No secondary findings of cirrhosis noted. No focal hepatic lesion or intrahepatic biliary duct dilatation. Portal vein is patent on color Doppler imaging with normal direction of blood flow towards the liver. Other: None. IMPRESSION: 1. No acute abnormality. 2. Hepatic steatosis. Electronically Signed   By: Constance Holster M.D.   On: 05/07/2020 19:01    ASSESMENT:   *   Nausea, vomiting, left abdominal pain.  Elevated LFTs, improving AST/ALT 1944/1092 >> 406/555.  Alk phos 96 >> 49.  T bili 1.7 >> 2.2. Platelets 198.  Acute hepatitis panel negative/nonreactive. Heavy ETOH but LFTs well beyond levels from etoh hepatitis alone.  No hypotension or tachycardia observed but suspect dehydration. CTAP w contrast.  Diffuse hepatic steatosis.  Moderate GB distention but no stones or cholecystitis.  Ducts not dilated.  Pancreas and PD normal.  No splenomegaly. Ultrasound abdomen:   Hepatic steatosis.  Gallbladder unremarkable.  CBD 3 mm.  PV Doppler studies normal.  *    Hyponatremia.  Sodium 132.  *    Coagulopathy, resolved.  INR 1.4 >> 1.  *    Elevated lactate, improved.  Hb went from 18.1 >> 15.   All consistent with dehydration though no AKI.   PLAN   *   ETOH abstinence.   Patient expressed interest in beginning medication which would prevent him from drinking, so question add Naltrexone at discharge?   Would provide information re [place] Language AA meetings in [place] as a resource.  Does he qualify for any outpt ETOH rehab resources?     *    Agree with advancing to a regular diet.  *    If diarrhea persists through the next 24 hours, will check stool studies for PCR and C. Difficile  *    Protonix 40 mg (or other  equivalent full dose PPI) daily for 1 month. If he is able to abstain from alcohol and has no recurrence of abdominal pain or nausea/vomiting, he can discontinue this medication. Otherwise can continue this indefinitely.    *   CMET in AM.     Azucena Freed  05/09/2020, 10:50 AM Phone [phone]

## 2020-05-09 NOTE — Evaluation (Signed)
Occupational Therapy Evaluation Patient Details Name: Derek Cruz MRN: [id number] DOB: February 28, 1966 Today's Date: 05/09/2020    History of Present Illness Patient is a 54 y/o male who presents with N/V, left abdominal pain with elevated LFTs. Admitted with Acute hepatitis and  gastritis. PMH includes T2DM, HTN, HLD, Alcohol Use Disorder.   Clinical Impression   PTA, pt was living at home with his wife, pt reports he was independent with ADL/IADL and functional mobility and was still working in [employer]. Pt currently functioning close to baseline, he is able to complete ADL and functional mobility at independent-modified independent level. Pt able to tolerate challenge to balance without LOB.Patient evaluated by Occupational Therapy with no further acute OT needs identified. All education has been completed and the patient has no further questions. See below for any follow-up Occupational Therapy or equipment needs. OT to sign off. Thank you for referral.      Follow Up Recommendations  No OT follow up    Equipment Recommendations  None recommended by OT    Recommendations for Other Services       Precautions / Restrictions Precautions Precautions: Fall Restrictions Weight Bearing Restrictions: No      Mobility Bed Mobility Overal bed mobility: Independent                  Transfers Overall transfer level: Independent Equipment used: None             General transfer comment: no AD utulized, pt completed functional mobility at independent level, no LOB noted, good stability able to pick item off ground, turn in different directions, stop suddenly without LOB    Balance Overall balance assessment: Independent                                         ADL either performed or assessed with clinical judgement   ADL Overall ADL's : Modified independent                                       General ADL Comments:  pt able to complete full body dressing, toileting, grooming, functional mobility at modified independent level, pt taking time with mobility, no LOB noted during session     Vision Baseline Vision/History: No visual deficits Patient Visual Report: No change from baseline       Perception     Praxis      Pertinent Vitals/Pain Pain Assessment: No/denies pain     Hand Dominance Right   Extremity/Trunk Assessment Upper Extremity Assessment Upper Extremity Assessment: Overall WFL for tasks assessed   Lower Extremity Assessment Lower Extremity Assessment: Defer to PT evaluation;Overall [facility] for tasks assessed   Cervical / Trunk Assessment Cervical / Trunk Assessment: Normal   Communication Communication Communication: No difficulties   Cognition Arousal/Alertness: Awake/Cruz Behavior During Therapy: WFL for tasks assessed/performed Overall Cognitive Status: Within Functional Limits for tasks assessed                                 General Comments: A&Ox4.   General Comments       Exercises     Shoulder Instructions      Home Living Family/patient expects to be discharged to:: Private residence Living Arrangements: Spouse/significant  other;Children (kids ages 33, 61, 68) Available Help at Discharge: Family;Available PRN/intermittently Type of Home: Mobile home Home Access: Stairs to enter Entrance Stairs-Number of Steps: 4 Entrance Stairs-Rails: Right Home Layout: One level     Bathroom Shower/Tub: Chief Strategy Officer: Standard     Home Equipment: None          Prior Functioning/Environment Level of Independence: Independent        Comments: Drives, works in [employer].        OT Problem List: Decreased knowledge of precautions      OT Treatment/Interventions:      OT Goals(Current goals can be found in the care plan section) Acute Rehab OT Goals Patient Stated Goal: to go home today OT Goal Formulation: With  patient Time For Goal Achievement: 05/23/20 Potential to Achieve Goals: Good  OT Frequency:     Barriers to D/C:            Co-evaluation              AM-PAC OT "6 Clicks" Daily Activity     Outcome Measure Help from another person eating meals?: None Help from another person taking care of personal grooming?: None Help from another person toileting, which includes using toliet, bedpan, or urinal?: None Help from another person bathing (including washing, rinsing, drying)?: None Help from another person to put on and taking off regular upper body clothing?: None Help from another person to put on and taking off regular lower body clothing?: None 6 Click Score: 24   End of Session Nurse Communication: Mobility status  Activity Tolerance: Patient tolerated treatment well Patient left: in chair;with call bell/phone within reach  OT Visit Diagnosis: Other abnormalities of gait and mobility (R26.89)                Time: 8250-0370 OT Time Calculation (min): 11 min Charges:  OT General Charges $OT Visit: 1 Visit OT Evaluation $OT Eval Low Complexity: 1 Low  Derek Cruz OTR/L Acute Rehabilitation Services Office: [phone]   Derek Cruz 05/09/2020, 1:51 PM

## 2020-05-09 NOTE — Hospital Course (Addendum)
Assessment and Plan:   Derek Cruz is a 54 y.o. male presenting with abdominal pain with concern for gastritis and acute hepatitis. PMH is significant for T2DM, GERD, HTN, HLD, Alcohol Use Disorder.    LUQ pain likely 2/2 gastritis  Patient presented with abdominal pain. Pain is likely due to gastritis secondary to alcohol use. Pt LA was elevated to 3.4,. FOBT negative. Pt's symptoms were managed with Iv fluids and IV Protonix. GI consulted recommended continuing IV Protonix and Iv fluids and UGD and Colonoscopy on outpatient basis.   Hepatitis 2/2 EOTH use  Pt's LFT's were elevated, AST 1944 and ALT 1092. We monitored AST and ALT which were down trended to 406 and 555. ALP elevated to 149. Total bilirubin elevated at 1.7. RUQ abdominal ultrasound showed "diffuse increased echogenicity, hepatic steatosis without acute abnormality, portal vein patent, normal-appearing gallbladder without stones or wall thickening". Pt was started on precautionary Lactulose but stopped later on as Pt was mentating good.   Alcohol Use Pt was put on CIWA protocol and started on Thiamine 100 mg daily and Folic acid 1 mg daily   T2DM A1c 6.9% this admission. Pt's BG was managed with SSI. Metformin was held.   Electrolytes were repeated as needed. Pt's other chronic medical problems were stable.

## 2020-05-09 NOTE — Discharge Summary (Addendum)
Derek Cruz  Patient name: Derek Cruz Medical record number: [id number] Date of birth: 1965/12/27 Age: 54 y.o. Gender: male Date of Admission: 05/07/2020  Date of Discharge:05/09/20 Admitting Physician: Richarda Osmond, DO  Primary Care Provider: Azzie Glatter, FNP Consultants: GI  Indication for Hospitalization: LUQ pain  Discharge Diagnoses/Problem List:  Gastritis secondary to alcohol use Hepatitis 2/2 EOTH use Alcohol use disorder T2DM HTN HLD Hypokalemia Hypornatremia  Disposition:Home  Discharge Condition: Stable  Discharge Exam: General: Comfortably at room air, not in acute distress, not anxious Cardiovascular: S1, S2 normal, RRR, no murmurs, rubs, gallops. Respiratory: Clear to auscultation, no wheezes, rhonchi, rales Abdomen: Mild tenderness to palpation in left upper quadrant.  No guarding, bowel sounds present Extremities: No peripheral edema  Brief Hospital Course:  Assessment and Plan:   Derek Cruz is a 54 y.o. male presenting with abdominal pain with concern for gastritis and acute hepatitis. PMH is significant for T2DM, GERD, HTN, HLD, Alcohol Use Disorder.    LUQ pain likely 2/2 gastritis  Patient presented with abdominal pain. Pain is likely due to gastritis secondary to alcohol use. Pt LA was elevated to 3.4,. FOBT negative. Pt's symptoms were managed with Iv fluids and IV Protonix. GI consulted recommended continuing IV Protonix and Iv fluids and UGD and Colonoscopy on outpatient basis.   Hepatitis 2/2 EOTH use  Pt's LFT's were elevated, AST 1944 and ALT 1092. We monitored AST and ALT which were down trended to 406 and 555. ALP elevated to 149. Total bilirubin elevated at 1.7. RUQ abdominal ultrasound showed "diffuse increased echogenicity, hepatic steatosis without acute abnormality, portal vein patent, normal-appearing gallbladder without stones or wall thickening". Pt  was started on precautionary Lactulose but stopped later on as Pt was mentating good.   Alcohol Use Pt was put on CIWA protocol and started on Thiamine 242 mg daily and Folic acid 1 mg daily   T2DM A1c 6.9% this admission. Pt's BG was managed with SSI. Metformin was held.   Electrolytes were repeated as needed. Pt's other chronic medical problems were stable.     Issues for Follow Up:  1.  F/U with GI to schedule endoscopy and Colonoscopy on outpatient basis 2. Protonix daily as directed for 1 month.  Can d/c if able to abstain from alcohol and has no recurrence of abdominal pain, N/V.  If unable to do these, can continue indefinitely.   3. Metformin was held on discharge in setting of elevated LFTs and A1c on 6.9.  Consider restarting vs starting another agent if needed as outpatient. 4. Given patient's ASCVD Risk of 21.7%, consider initiating high intensity statin when LFTs improve. 5. Pt got Pfizer COVID vaccine during this visit. Please follow the instructions to get 2nd dose of COVID vaccine in 21 days. 6. Continue to encourage alcohol use cessation. 7. F/U liver disease serologies obtained before discharge. 8. Patient should not be on NSAIDs.   Significant Procedures: None  Significant Labs and Imaging:  Recent Labs  Lab 05/07/20 0832 05/08/20 0105  WBC 8.5 7.6  HGB 18.1* 15.0  HCT 48.8 41.2  PLT 263 198   Recent Labs  Lab 05/07/20 0832 05/08/20 0105 05/09/20 0643  NA 130* 134* 132*  K 4.1 3.2* 3.6  CL 95* 102 101  CO2 20* 20* 20*  GLUCOSE 192* 139* 122*  BUN _0 CREATININE 0.69 0.70 0.63  CALCIUM 7.7* 7.3* 8.6*  ALKPHOS 96 96 149*  AST 1,944*  1,074* 406*  ALT 1,092* 849* 555*  ALBUMIN 3.8 3.2* 3.4*     Results/Tests Pending at Time of Discharge: none  Discharge Medications:  Allergies as of 05/09/2020   No Known Allergies     Medication List    STOP taking these medications   blood glucose meter kit and supplies Kit   metFORMIN 500 MG  tablet Commonly known as: GLUCOPHAGE     TAKE these medications   folic acid 1 MG tablet Commonly known as: FOLVITE Take 1 tablet (1 mg total) by mouth daily. Start taking on: May 10, 2020   lisinopril 20 MG tablet Commonly known as: ZESTRIL Take 20 mg by mouth daily.   pantoprazole 40 MG tablet Commonly known as: PROTONIX Take 1 tablet (40 mg total) by mouth daily.   thiamine 100 MG tablet Take 1 tablet (100 mg total) by mouth daily. Start taking on: May 10, 2020       Discharge Instructions: Please refer to Patient Instructions section of EMR for full details.  Patient was counseled important signs and symptoms that should prompt return to medical care, changes in medications, dietary instructions, activity restrictions, and follow up appointments.   Follow-Up Appointments:   Armando Reichert, MD 05/09/2020, 4:06 PM PGY-1, [place] Upper-Level Resident Addendum   I have independently interviewed and examined the patient. I have discussed the above with the original author and agree with their documentation. My edits for correction/addition/clarification have been made.  [place] Constable, D.O. PGY-3, [facility] 05/09/2020 5:07 PM

## 2020-05-09 NOTE — Telephone Encounter (Signed)
-----   Message from Benancio Deeds, MD sent at 05/09/2020  1:19 PM EST ----- Regarding: outpatient follow up HI [facility], This patient may be discharged later today or tomorrow. Should have follow up LFTs at end of the week and outpatient follow up with me or APP within the next month. Thank you

## 2020-05-09 NOTE — Progress Notes (Addendum)
Family Medicine Teaching Service Daily Progress Note Intern Pager: [phone]  Patient name: Derek Cruz Medical record number: [id number] Date of birth: [birth year] Age: 54 y.o. Gender: male  Primary Care Provider: Kallie Locks, FNP Consultants: GI Code Status: Full   Pt Overview and Major Events to Date:  12/18: Admitted  Assessment and Plan:  Derek Carruthers [place] a 54 y.o.malepresenting with abdominal pain with concern for gastritis and acute hepatitis. PMH is significant forT2DM, GERD, HTN, HLD, Alcohol Use Disorder. Pt may be able to go home today evening if he tolerates regular diet.  LUQ pain likely 2/2 gastritis  Patient denies any abdominal pain.he denies nausea or vomiting.  He states he had watery diarrhea yesterday multiple times with greenish watery stool and today one time. On exam Mild tenderness present in left upper quadrant, no guarding. Pain is likely due to gastritis secondary to alcohol use.  ETOH history as below. LA 3.4>3.4>1.5>1.3 (yesterday). FOBT negative  -GI consult appreciate recs -Stop IV fluids and advance diet to regular diet. -Change to PO  protonix 40mg  BID  -OT/PT eval and Treat  Hepatitis 2/2 EOTH use  AST and ALT at [phone]. ALP 96>149. Total bilirubin also elevated at 1.7>1.9>2.2. Platelets 198. RUQ abdominal ultrasound showed"diffuse increased echogenicity, hepatic steatosis without acute abnormality, portal vein patent, normal-appearing gallbladder without stones or wall thickening". Total output 700, Intake/output -220, INR-1, PT 13.0 -GI consulted, appreciate recs -IV Protonix 40mg  BID -Avoid hepatotoxic meds -D/c Lactulose. -Monitor I's and O's -Monitor LFT's ,PT,INR  Alcohol Use Most recent CIWA 0 He denies any withdrawl symptoms,  anxiety and tremors. Patient has a history of withdrawal tremors and possible seizures. He drinks 4-5 24oz beers per day and had last drink was 12/17 at  midnight. He reports that he has experienced alcohol withdrawal before 20 years ago.  -Continue CIWA protocol -TOC referral prior to dc -Thiamine 100 mg daily -Folic acid 1 mg daily  T2DM A1c 6.9% this admission. CBG today 114. Last CBG's 123,118,165.  Home meds: Metformin 1000mg daily -Carb modified diet -CBGs 4 times daily before meals and at bedtime -sSSI - Hold metformin  Hypokalemia (Resolved) K 3.6 this morning. -Monitor with BMP  HTN This morning BP 138/92 mmHg Home meds: Lisinopril 5mg  daily -Continue Lisinopril 5 mg daily -Monitor blood pressures  Hyponatremia Na 132 today corrected to 133, improved from 130 on admission. Likely related to dehydration, hyperglycemia (BG at admission 192)and alcohol use. -Monitor with daily CMP.  Dyslipidemia Lipid panel from February 2021:cholesterol 249, triglycerides 626, HDL low at 34, and LDL 107.  Home meds: pravastatin 40 mg daily and aspirin 81 g daily outpatient. Patient's ASCVD risk 25.6%. -Hold pravastatin due to acute hepatitis   FEN/GI:regular diet, protonix Prophylaxis:lovenox Status is: Inpatient, Pt may be able to D/C home today if he tolerates regular diet well.  Subjective:  Pt is sitting comfortably in chair denies any abdominal pain.  Patient states that he had diarrhea yesterday multiple times with greenish watery stool.  Today he had one bowel movement with greenish and watery stool.  He denies any alcohol withdrawal symptoms such as anxiety, tremors, sweating.  Objective: Temp:  [98.1 F (36.7 C)-98.4 F (36.9 C)] 98.2 F (36.8 C) (12/20 0558) Pulse Rate:  [66-80] 74 (12/20 0558) Resp:  [15-21] 15 (12/20 0558) BP: (136-146)/(90-99) 138/92 (12/20 0558) SpO2:  [96 %-100 %] 100 % (12/20 0558) Physical Exam: General: Comfortably at room air, not in acute distress, not anxious Cardiovascular: S1, S2 normal, RRR,  no murmurs, rubs, gallops. Respiratory: Clear to auscultation, no wheezes, rhonchi,  rales Abdomen: Mild tenderness to palpation in left upper quadrant.  No guarding, bowel sounds present Extremities: No peripheral edema  Laboratory: Recent Labs  Lab 05/07/20 0832 05/08/20 0105  WBC 8.5 7.6  HGB 18.1* 15.0  HCT 48.8 41.2  PLT 263 198   Recent Labs  Lab 05/07/20 0832 05/08/20 0105  NA 130* 134*  K 4.1 3.2*  CL 95* 102  CO2 20* 20*  BUN 14 11  CREATININE 0.69 0.70  CALCIUM 7.7* 7.3*  PROT 7.1 6.1*  BILITOT 1.7* 1.9*  ALKPHOS 96 96  ALT 1,092* 849*  AST 1,944* 1,074*  GLUCOSE 192* 139*    Imaging/Diagnostic Tests: CT ABDOMEN PELVIS W CONTRAST  Result Date: 05/07/2020 CLINICAL DATA:  Sharp left lower quadrant abdominal pain for 2 days, nausea and vomiting EXAM: CT ABDOMEN AND PELVIS WITH CONTRAST TECHNIQUE: Multidetector CT imaging of the abdomen and pelvis was performed using the standard protocol following bolus administration of intravenous contrast. CONTRAST:  OMNIPAQUE IOHEXOL 300 MG/ML  SOLN COMPARISON:  None. FINDINGS: Lower chest: No acute pleural or parenchymal lung disease. Hepatobiliary: Diffuse hepatic steatosis without focal abnormality. Gallbladder is moderately distended without cholelithiasis or cholecystitis. No biliary dilation. Pancreas: Unremarkable. No pancreatic ductal dilatation or surrounding inflammatory changes. Spleen: Normal in size without focal abnormality. Adrenals/Urinary Tract: Adrenal glands are unremarkable. Kidneys are normal, without renal calculi, focal lesion, or hydronephrosis. Bladder is unremarkable. Stomach/Bowel: No bowel obstruction or ileus. Normal appendix right lower quadrant. No bowel wall thickening or inflammatory change. Vascular/Lymphatic: No significant vascular findings are present. No enlarged abdominal or pelvic lymph nodes. Reproductive: Prostate is unremarkable. Other: No free fluid or free gas. No abdominal wall hernia. Musculoskeletal: There are no acute or destructive bony lesions. Multilevel  spondylosis and facet hypertrophy throughout the lumbar spine most pronounced at L4-5. Reconstructed images demonstrate no additional findings. IMPRESSION: 1. No acute intra-abdominal or intrapelvic process. 2. Hepatic steatosis. Electronically Signed   By: Sharlet Salina M.D.   On: 05/07/2020 16:40   US Abdomen Limited  Result Date: 05/07/2020 CLINICAL DATA:  Abdominal pain. EXAM: ULTRASOUND ABDOMEN LIMITED RIGHT UPPER QUADRANT COMPARISON:  CT from the same day FINDINGS: Gallbladder: No gallstones or wall thickening visualized. No sonographic Murphy sign noted by sonographer. Common bile duct: Diameter: 3 mm Liver: Diffuse increased echogenicity with slightly heterogeneous liver. Appearance typically secondary to fatty infiltration. Fibrosis secondary consideration. No secondary findings of cirrhosis noted. No focal hepatic lesion or intrahepatic biliary duct dilatation. Portal vein is patent on color Doppler imaging with normal direction of blood flow towards the liver. Other: None. IMPRESSION: 1. No acute abnormality. 2. Hepatic steatosis. Electronically Signed   By: Katherine Mantle M.D.   On: 05/07/2020 19:01    Karsten Ro, MD 05/09/2020, 6:17 AM PGY-1, [facility] FPTS Intern pager: [phone], text pages welcome

## 2020-05-09 NOTE — Telephone Encounter (Signed)
Lab order in epic. Patient is scheduled for a follow up with Dr. Adela Lank on Tuesday, 06/07/2020 at 3:20 PM.   Called patient twice, no vm set up. Spoke with patient's wife, advised her to have patient call us back.

## 2020-05-09 NOTE — Discharge Instructions (Signed)
Dear Derek Cruz,   Thank you for letting us participate in your care! In this section, you will find a brief hospital admission summary of why you were admitted to the hospital, what happened during your admission, your diagnosis/diagnoses, and recommended follow up.   You were admitted because you were experiencing abdominal pain, nausea, vomiting.   Your testing revealed Gastritis.   You were diagnosed with Gastritis and Hepatitis secondary to alcohol use.  You were treated with Protonix, iv fluids and Lorazepam for alcohol withdrawal.   You were also seen by GI. They recommended continuing protonix and EGD and Colonoscopy on outpatient basis.   Your symptoms improved and you were discharged from the hospital for meeting this goal.   Metformin was held during inpatient hospitalization. Please call your PCP before restarting again.   You also got Pfizer COVID vaccine during this visit.  You should have your second dose in 21 days.   POST-HOSPITAL & CARE INSTRUCTIONS 1. Please F/U with GI to schedule endoscopy and Colonoscopy on outpatient basis 2. Please let PCP/Specialists know of any changes in medications that were made.  3. Please see medications section of this packet for any medication changes. 4. Please take Protonix daily as directed.  5. Please contact your PCP to restart Metformin.  You should not start it while your liver enzymes are elevated.  6. Do not drink alcohol. Your condition may worsen if you start drinking again.  7. You also got Pfizer COVID vaccine during this visit. Please follow the instructions to get 2nd dose of COVID vaccine in 21 days.   DOCTOR'S APPOINTMENTS & FOLLOW UP Future Appointments  Date Time Provider Department Center  06/07/2020  3:20 PM Armbruster, Willaim Rayas, MD [facility]     Thank you for choosing [facility]! Take care and be well!  Family Medicine Teaching Service Inpatient Team [place]  [facility]  [address] [place], [place] [address] [phone]

## 2020-05-09 NOTE — Progress Notes (Signed)
D/C instructions given to patient with Spanish interpretor. Medications reviewed. COVID vaccine administered and no reaction observed. IV removed clean and intact. Wife to escort pt home.  Versie Starks, RN

## 2020-05-10 ENCOUNTER — Other Ambulatory Visit: Payer: Self-pay | Admitting: Family Medicine

## 2020-05-10 DIAGNOSIS — I10 Essential (primary) hypertension: Secondary | ICD-10-CM

## 2020-05-10 LAB — ANTI-SMOOTH MUSCLE ANTIBODY, IGG: F-Actin IgG: 5 Units (ref 0–19)

## 2020-05-10 LAB — IGG: IgG (Immunoglobin G), Serum: 1227 mg/dL (ref 603–1613)

## 2020-05-10 LAB — CERULOPLASMIN: Ceruloplasmin: 19.2 mg/dL (ref 16.0–31.0)

## 2020-05-10 LAB — ANA W/REFLEX IF POSITIVE: Anti Nuclear Antibody (ANA): NEGATIVE

## 2020-05-10 NOTE — Telephone Encounter (Signed)
Spoke with patient, he is going to come in this Thursday for lab work. He is aware that no appt is necessary, advised that he can go by the lab in the basement of our office building between 7:30 AM - 5 PM. Patient states that he has our address, it was given to him at the hospital. Patient is aware that he has a follow up on 06/07/2020, will mail letter with appointment information. Patient verbalized understanding of all information and had no concerns at the end of the call.

## 2020-05-10 NOTE — Telephone Encounter (Signed)
Called patient twice, no vm set up, unable to leave a message.   Spoke with patient's wife, she states that she is at work and not currently with the patient but she will have the patient call me back. I provided her with my name and number to the office.

## 2020-05-11 ENCOUNTER — Other Ambulatory Visit: Payer: Self-pay

## 2020-05-11 DIAGNOSIS — K7010 Alcoholic hepatitis without ascites: Secondary | ICD-10-CM

## 2020-06-07 ENCOUNTER — Encounter: Payer: Self-pay | Admitting: Gastroenterology

## 2020-06-07 ENCOUNTER — Other Ambulatory Visit (INDEPENDENT_AMBULATORY_CARE_PROVIDER_SITE_OTHER): Payer: Self-pay

## 2020-06-07 ENCOUNTER — Ambulatory Visit (INDEPENDENT_AMBULATORY_CARE_PROVIDER_SITE_OTHER): Payer: Self-pay | Admitting: Gastroenterology

## 2020-06-07 ENCOUNTER — Other Ambulatory Visit: Payer: Self-pay | Admitting: Gastroenterology

## 2020-06-07 VITALS — BP 104/66 | HR 68 | Ht 61.0 in | Wt 171.0 lb

## 2020-06-07 DIAGNOSIS — I10 Essential (primary) hypertension: Secondary | ICD-10-CM

## 2020-06-07 DIAGNOSIS — F1010 Alcohol abuse, uncomplicated: Secondary | ICD-10-CM

## 2020-06-07 DIAGNOSIS — Z1211 Encounter for screening for malignant neoplasm of colon: Secondary | ICD-10-CM

## 2020-06-07 DIAGNOSIS — K7010 Alcoholic hepatitis without ascites: Secondary | ICD-10-CM

## 2020-06-07 DIAGNOSIS — R7989 Other specified abnormal findings of blood chemistry: Secondary | ICD-10-CM

## 2020-06-07 DIAGNOSIS — R748 Abnormal levels of other serum enzymes: Secondary | ICD-10-CM

## 2020-06-07 LAB — HEPATIC FUNCTION PANEL
ALT: 17 U/L (ref 0–53)
AST: 16 U/L (ref 0–37)
Albumin: 4.6 g/dL (ref 3.5–5.2)
Alkaline Phosphatase: 87 U/L (ref 39–117)
Bilirubin, Direct: 0.1 mg/dL (ref 0.0–0.3)
Total Bilirubin: 0.4 mg/dL (ref 0.2–1.2)
Total Protein: 7.5 g/dL (ref 6.0–8.3)

## 2020-06-07 LAB — FERRITIN: Ferritin: 223.1 ng/mL (ref 22.0–322.0)

## 2020-06-07 MED ORDER — PANTOPRAZOLE SODIUM 40 MG PO TBEC: 40.0000 mg | DELAYED_RELEASE_TABLET | Freq: Every day | ORAL | 2 refills | Status: DC | PRN

## 2020-06-07 MED ORDER — LISINOPRIL 20 MG PO TABS: 20.0000 mg | ORAL_TABLET | Freq: Every day | ORAL | 1 refills | Status: DC

## 2020-06-07 MED FILL — PANTOPRAZOLE SOD DR 40 MG T: 40 | 30 days supply | Qty: 30 | Fill #0

## 2020-06-07 MED FILL — LISINOPRIL 20 MG TABLET: 20 | 30 days supply | Qty: 30 | Fill #0

## 2020-06-07 NOTE — Progress Notes (Signed)
HPI :  55 year old Hispanic male, Spanish-speaking only, who is here for a follow-up visit for elevated liver enzymes, abdominal pain, following recent hospitalization.  He was admitted in December with abdominal pain and marked transaminitis with AST of 1944, ALT 1092, bili 1.7.  He had an INR elevation of 1.4.  This occurred in the setting of significant alcohol use which was chronic for him.  He underwent an extensive evaluation with serologies and imaging as outlined below.  Ultimately with conservative monitoring and time his liver enzymes down trended and he felt considerably better and was discharged.  Given the marked elevation in transaminitis I was concerned something else was contributing to this as opposed to solely from alcohol use.  He has drank 4-5 large beers daily for some time.  Since he was discharged from the hospital he has completely stopped drinking alcohol.  He is feeling considerably improved compared to previous.  He has been watching his diet and eating low-fat food.  No alcohol use.  His upper abdominal pain is improved and continues to get better.  He was given a course of Protonix during his hospitalization.  Denies any reflux symptoms at this time.  He is eating well, appetite is getting better.  He has some occasional heartburn that bothers him but not routinely.  He had stopped metformin that was given to him previously in the setting of acute hepatitis, he has not resumed that yet.  He continues to take lisinopril for hypertension and has been on folic acid supplementation as well as thiamine.  He denies any family history of liver disease.  No family history of cirrhosis.  No family history of colon cancer.  He is never had a prior colonoscopy.  No blood in his stools.  No bowel changes.  As part of his evaluation as outlined below he had a marked elevation in ferritin as well as an elevated iron saturation.  This was thought to likely be due to acute phase reactant in  the setting of acute hepatic inflammation but hemochromatosis genetic testing was ordered but he has not followed up for that yet.  He tested negative for viral hepatitis, autoimmune hepatitis, Wilson's.  Imaging shows hepatic steatosis.  No overt cirrhosis.  Normal spleen size.  Platelets normal.  INR normalized at time of discharge.  He denied excessive use of Tylenol in the setting of alcohol use or other herbal supplementations or other medications that could been related at the time.  Of note he does not have insurance and we discussed that for a bit today.  He does not have scheduled follow-up with his primary care yet.   Prior workup: Iron sat 51% Iron 164 TIBC 319 Ferritin 1932 Ceruloplasmin 19.2 ANA negative IgG 1227 SMA 5  Hep B surface antigen HCV AB (-) Hep A negative  RUQ [name] 05/07/20: IMPRESSION: 1. No acute abnormality. 2. Hepatic steatosis.  CT abdomen / pelvis with contrast 05/07/20: IMPRESSION: 1. No acute intra-abdominal or intrapelvic process. 2. Hepatic steatosis.    Past Medical History:  Diagnosis Date  . Alcohol abuse   . Chest pain   . Dyslipidemia   . Elevated liver enzymes   . Gastritis   . Hypertension      History reviewed. No pertinent surgical history. Family History  Problem Relation Age of Onset  . Diabetes Mellitus II Neg Hx   . Stomach cancer Neg Hx   . Colon cancer Neg Hx    Social History   Tobacco  Use  . Smoking status: Former Smoker  . Smokeless tobacco: Never Used  Vaping Use  . Vaping Use: Never used  Substance Use Topics  . Alcohol use: Not Currently    Comment: 6 pack a day, none now 06-07-2020  . Drug use: No   Current Outpatient Medications  Medication Sig Dispense Refill  . thiamine 100 MG tablet Take 1 tablet (100 mg total) by mouth daily. 30 tablet 0  . lisinopril (ZESTRIL) 20 MG tablet Take 1 tablet (20 mg total) by mouth daily. 30 tablet 1  . pantoprazole (PROTONIX) 40 MG tablet Take 1 tablet (40 mg total)  by mouth daily as needed. 30 tablet 2   No current facility-administered medications for this visit.   No Known Allergies   Review of Systems: All systems reviewed and negative except where noted in HPI.   Lab Results  Component Value Date   ALT 555 (H) 05/09/2020   AST 406 (H) 05/09/2020   ALKPHOS 149 (H) 05/09/2020   BILITOT 2.2 (H) 05/09/2020    Lab Results  Component Value Date   CREATININE 0.63 05/09/2020   BUN 8 05/09/2020   NA 132 (L) 05/09/2020   K 3.6 05/09/2020   CL 101 05/09/2020   CO2 20 (L) 05/09/2020    Lab Results  Component Value Date   WBC 7.6 05/08/2020   HGB 15.0 05/08/2020   HCT 41.2 05/08/2020   MCV 88.6 05/08/2020   PLT 198 05/08/2020    Lab Results  Component Value Date   INR 1.0 05/09/2020   INR 1.3 (H) 05/08/2020   INR 1.4 (H) 05/07/2020     Physical Exam: BP 104/66   Pulse 68   Ht 5\' 1"  (1.549 m)   Wt 171 lb (77.6 kg)   BMI 32.31 kg/m  Constitutional: Pleasant,well-developed, male in no acute distress. HEENT: Normocephalic and atraumatic. Conjunctivae are normal. No scleral icterus. Neck supple.  Cardiovascular: Normal rate, regular rhythm.  Pulmonary/chest: Effort normal and breath sounds normal.  Abdominal: Soft, nondistended, nontender.  There are no masses palpable.  Extremities: no edema Lymphadenopathy: No cervical adenopathy noted. Neurological: Alert and oriented to person place and time. Skin: Skin is warm and dry. No rashes noted. Psychiatric: Normal mood and affect. Behavior is normal.   ASSESSMENT AND PLAN: 55 year old male here for reassessment of the following:  Elevated liver enzyme / Alcohol abuse / Elevated ferritin - as above, significant transaminitis leading to hospitalization with elevated INR in December.  He has chronic excessive alcohol use preceding this however level of enzymes was much higher than would normally see with solely alcoholic hepatitis.  He denies any new medications around this time  of herbal supplementations etc.  No Tylenol.  With conservative therapy and monitoring his liver function testing improved.  He is feeling much better now, he has been completely abstinent from alcohol and recommend he continue to do so.  He did not have any evidence of cirrhosis on imaging or labs thus far.  At this point time I will repeat his LFTs to see where this is trending.  I suspect his elevated ferritin was an acute phase reactant secondary to the hepatic inflammation and not a primary driver of this but we will repeated to ensure okay and screen him for hereditary hemochromatosis given the elevation in iron saturation and ferritin.  If his liver enzymes have improved we may resume the metformin, he will be contacted with his lab results.  Hopefully if he abstains from  alcohol his risks for cirrhosis will be significantly reduced.  He has hepatic steatosis likely from alcohol use and will need to keep an eye on this over time.  He agreed, further recommendations pending results of labs.  Of note, I refilled Protonix for 1 month refill that he can use as needed for upset stomach or GERD moving forward.  He can stop thiamine and folic acid supplementation and use General vitamin once daily  Colon cancer screening - asymptomatic and due for routine colon cancer screening however he does not have healthcare insurance.  We discussed this for a bit.  I do not think he will be able to afford colonoscopy without insurance.  Counseled him on trying to get orange card for his care locally in [facility] and he should also apply for Medicaid.  Recommend he also touch base with his primary care about options if he still struggling to get insurance.  Hypertension - refilled his lisinopril for 1 month until he can follow-up with his primary care physician for longer-term management of this and his diabetes.  Ileene Patrick, MD  Gastroenterology  CC: Kallie Locks, FNP

## 2020-06-07 NOTE — Patient Instructions (Addendum)
If you are age 55 or older, your body mass index should be between 23-30. Your Body mass index is 32.31 kg/m. If this is out of the aforementioned range listed, please consider follow up with your Primary Care Provider.  If you are age 41 or younger, your body mass index should be between 19-25. Your Body mass index is 32.31 kg/m. If this is out of the aformentioned range listed, please consider follow up with your Primary Care Provider.    Please go to the lab in the basement of our building to have lab work done as you leave today. Hit "B" for basement when you get on the elevator.  When the doors open the lab is on your left.  We will call you with the results. Thank you.  We will give you the [employer] application today for [employer].  Please complete and return to the address at the bottom of the form.  Please avoid alcohol.      We have sent the following medications to your pharmacy for you to pick up at your convenience: Pantoprazole 40 mg: Take daily as needed Lisinopril 20 Mg: Take once daily (Please follow up with your PCP, Dr. Bradly Chris, for additional refills of this medication)   Please discontinue thiamin and folic acid.   Please purchase the following medications over the counter and take as directed: Men's Multi Vitamin   Thank you for entrusting me with your care and for choosing [facility], Dr. Ileene Patrick

## 2020-06-09 ENCOUNTER — Other Ambulatory Visit: Payer: Self-pay

## 2020-06-09 DIAGNOSIS — R748 Abnormal levels of other serum enzymes: Secondary | ICD-10-CM

## 2020-06-09 DIAGNOSIS — K7010 Alcoholic hepatitis without ascites: Secondary | ICD-10-CM

## 2020-06-15 LAB — HEMOCHROMATOSIS DNA-PCR(C282Y,H63D)

## 2020-06-28 MED FILL — LISINOPRIL 20 MG TABLET: 20 | 30 days supply | Qty: 30 | Fill #0

## 2020-06-28 MED FILL — PANTOPRAZOLE SOD DR 40 MG T: 40 | 30 days supply | Qty: 30 | Fill #0

## 2020-07-11 ENCOUNTER — Telehealth: Payer: Self-pay

## 2020-07-11 NOTE — Telephone Encounter (Signed)
-----   Message from Missy Sabins, RN sent at 06/09/2020  9:50 AM EST ----- Regarding: Labs Repeat hepatic function panel. Order in epic.

## 2020-07-11 NOTE — Telephone Encounter (Signed)
Called patient twice, no vm set up, unable to leave a message at this time.

## 2020-07-14 NOTE — Telephone Encounter (Signed)
Called patient to remind him that he is due for repeat labs at this time, he is aware that no appt is necessary and he can stop by the lab in the basement at his convenience between 7:30 AM - 5 PM. Patient asks that I give him a call to remind him on Monday, 07/18/20. Patient verbalized understanding and had no concerns at the end of the call. Will call patient on Monday.

## 2020-07-19 NOTE — Telephone Encounter (Signed)
Called patient, no vm set up, unable to leave a message. 

## 2020-07-20 NOTE — Telephone Encounter (Signed)
Called patient twice, no vm set up, unable to leave a message.

## 2020-07-22 ENCOUNTER — Other Ambulatory Visit: Payer: Self-pay

## 2020-07-22 DIAGNOSIS — K7010 Alcoholic hepatitis without ascites: Secondary | ICD-10-CM

## 2020-07-22 DIAGNOSIS — R748 Abnormal levels of other serum enzymes: Secondary | ICD-10-CM

## 2020-07-22 LAB — HEPATIC FUNCTION PANEL
ALT: 13 U/L (ref 0–53)
AST: 15 U/L (ref 0–37)
Albumin: 4.3 g/dL (ref 3.5–5.2)
Alkaline Phosphatase: 69 U/L (ref 39–117)
Bilirubin, Direct: 0.1 mg/dL (ref 0.0–0.3)
Total Bilirubin: 0.7 mg/dL (ref 0.2–1.2)
Total Protein: 7.3 g/dL (ref 6.0–8.3)

## 2020-07-22 NOTE — Telephone Encounter (Signed)
Spoke with patient to remind him of repeat lab work. Patient states that he will try to go today.

## 2020-07-27 ENCOUNTER — Other Ambulatory Visit: Payer: Self-pay

## 2020-07-27 DIAGNOSIS — R748 Abnormal levels of other serum enzymes: Secondary | ICD-10-CM

## 2020-07-27 DIAGNOSIS — K7010 Alcoholic hepatitis without ascites: Secondary | ICD-10-CM

## 2020-08-15 MED FILL — LISINOPRIL 20 MG TABLET: 20 | 30 days supply | Qty: 30 | Fill #1

## 2020-09-13 NOTE — Progress Notes (Signed)
Subjective:    Derek Cruz - 55 y.o. male MRN [id number]  Date of birth: 09-Mar-1966 Derek Cruz   HPI  Derek Cruz is to establish care. Patient has a PMH significant for essential hypertension, gastritis, gastroesophageal reflux disease without esophagitis, liver failure, alcoholic hepatitis, diabetes mellitus type 2 controlled, dyslipidemia, alcohol abuse, elevated liver enzymes, and polycythemia.   Current issues and/or concerns: 1. DIABETES TYPE 2 FOLLOW-UP: Visit at [facility] Patient [facility] 01/04/2020 per NP note: He will continue medication as prescribed, to decrease foods/beverages high in sugars and carbs and follow Heart Healthy or DASH diet. Increase physical activity to at least 30 minutes cardio exercise daily.  09/14/2020: Last A1C: 5.7% on 09/14/2020   Med Adherence:  []  Yes    []  No Medication side effects:  [x]  Yes, abdominal pain from Metformin  Diet Adherence: []  Yes    [x]  No Exercise: [x]  Yes    []  No    ROS per HPI   Health Maintenance:  Health Maintenance Due  Topic Date Due  . PNEUMOCOCCAL POLYSACCHARIDE VACCINE AGE 45-64 HIGH RISK  Never done  . FOOT EXAM  Never done  . OPHTHALMOLOGY EXAM  Never done  . COLONOSCOPY (Pts 45-44yrs Insurance coverage will need to be confirmed)  Never done  . COVID-19 Vaccine (2 - Pfizer 3-dose series) 05/30/2020    Past Medical History: Patient Active Problem List   Diagnosis Date Noted  . Elevated liver enzymes   . Alcohol abuse   . Liver failure (HCC) 05/07/2020  . Alcoholic hepatitis   . Hyponatremia   . Polycythemia   . Hemoglobin A1c less than 7.0% 07/16/2019  . Neck pain 03/31/2019  . Muscle strain 03/31/2019  . Undifferentiated abdominal pain 03/31/2019  . Gastroesophageal reflux disease without esophagitis 11/24/2018  . Chest discomfort 11/24/2018  . Essential hypertension 11/24/2018  . Pre-diabetes 11/24/2018  . Dyslipidemia 10/06/2016  . Chest pain 09/26/2016  . Diabetes  mellitus type 2, controlled (HCC) 09/26/2016    Social History   reports that he has quit smoking. He has never used smokeless tobacco. He reports previous alcohol use. He reports that he does not use drugs.   Family History  family history is not on file.   Medications: reviewed and updated   Objective:   Physical Exam BP 131/81 (BP Location: Left Arm, Patient Position: Sitting)   Pulse 67   Ht 5' 5.98" (1.676 m)   Wt 170 lb 3.2 oz (77.2 kg)   SpO2 97%   BMI 27.48 kg/m    Physical Exam HENT:     Head: Normocephalic and atraumatic.  Eyes:     Extraocular Movements: Extraocular movements intact.     Conjunctiva/sclera: Conjunctivae normal.     Pupils: Pupils are equal, round, and reactive to light.  Cardiovascular:     Rate and Rhythm: Normal rate and regular rhythm.     Pulses: Normal pulses.     Heart sounds: Normal heart sounds.  Pulmonary:     Effort: Pulmonary effort is normal.     Breath sounds: Normal breath sounds.  Musculoskeletal:     Cervical back: Normal range of motion and neck supple.  Neurological:     General: No focal deficit present.     Mental Status: He is alert and oriented to person, place, and time.  Psychiatric:        Mood and Affect: Mood normal.        Behavior: Behavior normal.  Assessment & Plan:  1. Encounter to establish care: - Patient presents today to establish care.  - Return for annual physical examination, labs, and health maintenance. Arrive fasting meaning having no for at least 8 hours prior to appointment. You may have only water or black coffee. Please take scheduled medications as normal.  2. Type 2 diabetes mellitus without complication, without long-term current use of insulin (HCC): - Hemoglobin A1c at goal today at 5.7%, goal < 7%. This is improved from previous A1c of 6.9% on 05/08/2020. Next hemoglobin A1c due July 2022. - Will change Metformin to Metformin XR at the same dosage to hopefully curb gastrointestinal  side effect.  - Discussed the importance of healthy eating habits, low-carbohydrate diet, low-sugar diet, regular aerobic exercise (at least 150 minutes a week as tolerated) and medication compliance to achieve or maintain control of diabetes. - BMP to evaluate kidney function and electrolyte balance. - Follow-up with primary provider in 3 months or sooner if needed.  - POCT glycosylated hemoglobin (Hb A1C) - Basic Metabolic Panel - metFORMIN (GLUCOPHAGE-XR) 500 MG 24 hr tablet; Take 2 tablets (1,000 mg total) by mouth daily with breakfast.  Dispense: 180 tablet; Refill: 0  3. Language barrier: - Patient accompanied by [facility] interpreter, Derek Cruz, during today's visit.    Patient was given clear instructions to go to Emergency Department or return to medical center if symptoms don't improve, worsen, or new problems develop.The patient verbalized understanding.  I discussed the assessment and treatment plan with the patient. The patient was provided an opportunity to ask questions and all were answered. The patient agreed with the plan and demonstrated an understanding of the instructions.   The patient was advised to call back or seek an in-person evaluation if the symptoms worsen or if the condition fails to improve as anticipated.    Ricky Stabs, NP 09/15/2020, 7:33 PM Primary Care at [facility]

## 2020-09-14 ENCOUNTER — Other Ambulatory Visit: Payer: Self-pay

## 2020-09-14 ENCOUNTER — Encounter: Payer: Self-pay | Admitting: Family

## 2020-09-14 ENCOUNTER — Ambulatory Visit (INDEPENDENT_AMBULATORY_CARE_PROVIDER_SITE_OTHER): Payer: Self-pay | Admitting: Family

## 2020-09-14 VITALS — BP 131/81 | HR 67 | Ht 65.98 in | Wt 170.2 lb

## 2020-09-14 DIAGNOSIS — E119 Type 2 diabetes mellitus without complications: Secondary | ICD-10-CM

## 2020-09-14 DIAGNOSIS — Z789 Other specified health status: Secondary | ICD-10-CM

## 2020-09-14 DIAGNOSIS — Z7689 Persons encountering health services in other specified circumstances: Secondary | ICD-10-CM

## 2020-09-14 LAB — POCT GLYCOSYLATED HEMOGLOBIN (HGB A1C): Hemoglobin A1C: 5.7 % — AB (ref 4.0–5.6)

## 2020-09-14 NOTE — Patient Instructions (Signed)
Return for annual physical examination, labs, and health maintenance. Arrive fasting meaning having no for at least 8 hours prior to appointment. You may have only water or black coffee. Please take scheduled medications as normal. Thank you for choosing Primary Care at [facility] for your medical home!    Derek Cruz was seen by Camillia Herter, NP today.   Derek Cruz's primary care provider is Derek Cruz Zachery Dauer, NP.   For the best care possible,  you should try to see Derek Fruits, NP whenever you come to clinic.   We look forward to seeing you again soon!  If you have any questions about your visit today,  please call us at [phone]  Or feel free to reach your provider via [place].    Type 2 Diabetes Mellitus, Self-Care, Adult When you have type 2 diabetes (type 2 diabetes mellitus), you must make sure your blood sugar (glucose) stays in a healthy range. You can do this with:  Nutrition.  Exercise.  Lifestyle changes.  Medicines or insulin, if needed.  Support from your doctors and others. What are the risks? Having diabetes can raise your risk for other long-term (chronic) health problems. You may get medicines to help prevent these problems. How to stay aware of blood sugar  Check your blood sugar level every day, as often as told.  Have your A1C (hemoglobin A1C) level checked two or more times a year. Have it checked more often if told.  Your doctor will set personal treatment goals for you. In general, you should have these blood sugar levels: ? Before meals: 80-130 mg/dL (4.4-7.2 mmol/L). ? After meals: below 180 mg/dL (10 mmol/L). ? A1C: less than 7%.   How to manage high and low blood sugar Symptoms of high blood sugar High blood sugar is also called hyperglycemia. Know the symptoms of high blood sugar. These may include:  More thirst.  Hunger.  Feeling very tired.  Needing to pee (urinate) more often than  normal.  Seeing things blurry. Symptoms of low blood sugar Low blood sugar is also called hypoglycemia. This is when blood sugar is at or below 70 mg/dL (3.9 mmol/L). Symptoms may include:  Hunger.  Feeling worried or nervous (anxious).  Feeling sweaty and cold to the touch (clammy).  Being dizzy or light-headed.  Feeling sleepy.  A fast heartbeat.  Feeling grouchy (irritable).  Tingling or loss of feeling (numbness) around your mouth, lips, or tongue.  Restless sleep. Diabetes medicines can cause low blood sugar. You are more at risk:  While you exercise.  After exercise.  During sleep.  When you are sick.  When you skip meals or do not eat for a long time. Treating low blood sugar If you think you have low blood sugar, eat or drink something sugary right away. Keep 15 grams of a fast-acting carb (carbohydrate) with you all the time. Make sure your family and friends know how to treat you if you cannot treat yourself. Treating very low blood sugar Severe hypoglycemia is when your blood sugar is at or below 54 mg/dL (3 mmol/L). Severe hypoglycemia is an emergency. Do not wait to see if the symptoms will go away. Get medical help right away. Call your local emergency services (911 in the [place] Do not drive yourself to the hospital. You may need a glucagon shot if you have very low blood sugar and you cannot eat or drink. Have a family member or friend learn how to  check your blood sugar and how to give you a glucagon shot. Ask your doctor if you should have a kit for glucagon shots. Follow these instructions at home: Medicines  Take diabetes medicines as told. If your doctor prescribed insulin or diabetes medicines, take them each day.  Do not run out of insulin or other medicines. Plan ahead.  If you use insulin, change the amount you take based on how active you are and what foods you eat. Your doctor will tell you how to do this.  Take over-the-counter and  prescription medicines only as told by your doctor. Eating and drinking  Eat healthy foods. These include: ? Low-fat (lean) proteins. ? Complex carbs, such as whole grains. ? Fresh Cruz and vegetables. ? Low-fat dairy products. ? Healthy fats.  Meet with a food expert (dietitian) to make an eating plan.  Follow instructions from your doctor about what you cannot eat or drink.  Drink enough fluid to keep your pee (urine) pale yellow.  Keep track of carbs that you eat. Read food labels and learn serving sizes of foods.  Follow your sick-day plan when you cannot eat or drink as normal. Make this plan with your doctor so it is ready to use.   Activity  Exercise as told by your doctor. You may need to: ? Do stretching and strength exercises 2 or more times a week. ? Do 150 minutes or more of exercise each week that makes your heart beat faster and makes you sweat.  Spread out your exercise over 3 or more days a week.  Do not go more than 2 days in a row without exercise.  Talk with your doctor before you start a new exercise. Your doctor may tell you to change: ? How much insulin or medicines you take. ? How much food you eat. Lifestyle  Do not use any products that contain nicotine or tobacco, such as cigarettes, e-cigarettes, and chewing tobacco. If you need help quitting, ask your doctor.  If you drink alcohol and your doctor says alcohol is safe for you: ? Limit how much you use to:  0-1 drink a day for women who are not pregnant.  0-2 drinks a day for men. ? Be aware of how much alcohol is in your drink. In the [place] one drink equals one 12 oz bottle of beer (355 mL), one 5 oz glass of wine (148 mL), or one 1 oz glass of hard liquor (44 mL).  Learn to deal with stress. If you need help, ask your doctor. Body care  Stay up to date with your shots (immunizations).  Have your eyes and feet checked by a doctor as often as told.  Check your skin and feet every day.  Check for cuts, bruises, redness, blisters, or sores.  Brush your teeth and gums two times a day. Floss one or more times a day.  Go to the dentist one or more times every 6 months.  Stay at a healthy weight.   General instructions  Share your diabetes care plan with: ? Your work or school. ? People you live with.  Carry a card or wear jewelry that says you have diabetes.  Keep all follow-up visits as told by your doctor. This is important. Questions to ask your doctor  Do I need to meet with a certified expert in diabetes education and care?  Where can I find a support group? Where to find more information  American Diabetes Association: www.diabetes.org  American Association of Diabetes Care and Education Specialists: www.diabeteseducator.org  International Diabetes Federation: MemberVerification.ca Summary  When you have type 2 diabetes, you must make sure your blood sugar (glucose) stays in a healthy range. You can do this with nutrition, exercise, medicines and insulin, and support from doctors and others.  Check your blood sugar every day, or as often as told.  Having diabetes can raise your risk for other long-term health problems. You may get medicines to help prevent these problems.  Share your diabetes management plan with people at work, school, and home.  Keep all follow-up visits as told by your doctor. This is important. This information is not intended to replace advice given to you by your health care provider. Make sure you discuss any questions you have with your health care provider. Document Revised: 12/09/2019 Document Reviewed: 12/09/2019 Elsevier Patient Education  [place].

## 2020-09-14 NOTE — Progress Notes (Signed)
Establish care Metformin causing abdominal pain and nausea  Left side pain

## 2020-09-15 LAB — BASIC METABOLIC PANEL
BUN/Creatinine Ratio: 23 — ABNORMAL HIGH (ref 9–20)
BUN: 16 mg/dL (ref 6–24)
CO2: 20 mmol/L (ref 20–29)
Calcium: 9.2 mg/dL (ref 8.7–10.2)
Chloride: 102 mmol/L (ref 96–106)
Creatinine, Ser: 0.7 mg/dL — ABNORMAL LOW (ref 0.76–1.27)
Glucose: 120 mg/dL — ABNORMAL HIGH (ref 65–99)
Potassium: 4 mmol/L (ref 3.5–5.2)
Sodium: 138 mmol/L (ref 134–144)
eGFR: 109 mL/min/{1.73_m2} (ref >59)

## 2020-09-15 MED ORDER — METFORMIN HCL ER 500 MG PO TB24
1000.0000 mg | ORAL_TABLET | Freq: Every day | ORAL | 0 refills | Status: DC
  Filled 2020-09-15: qty 60, 30d supply, fill #0
  Filled 2020-10-17: qty 60, 30d supply, fill #1
  Filled 2020-11-22: qty 60, 30d supply, fill #2

## 2020-09-15 NOTE — Progress Notes (Signed)
Kidney function normal.   Diabetes discussed in office.

## 2020-09-16 ENCOUNTER — Other Ambulatory Visit: Payer: Self-pay

## 2020-09-22 ENCOUNTER — Encounter: Payer: Self-pay | Admitting: *Deleted

## 2020-09-29 ENCOUNTER — Other Ambulatory Visit: Payer: Self-pay

## 2020-09-29 ENCOUNTER — Telehealth: Payer: Self-pay | Admitting: Family

## 2020-09-29 DIAGNOSIS — I10 Essential (primary) hypertension: Secondary | ICD-10-CM

## 2020-09-29 MED ORDER — LISINOPRIL 20 MG PO TABS
ORAL_TABLET | Freq: Every day | ORAL | 1 refills | Status: DC
  Filled 2020-09-29: qty 30, 30d supply, fill #0
  Filled 2021-02-20: qty 30, 30d supply, fill #1

## 2020-09-29 NOTE — Telephone Encounter (Signed)
Lisinopril refilled.

## 2020-09-29 NOTE — Telephone Encounter (Signed)
Pt called asking if he still needs to take lisinopril (ZESTRIL) 20 MG tablet? He is unsure if he still needs to take it and if he does, he needs a refill called in.     Pharmacy  [facility]  [address], [place] [address]  Phone:  [phone] Fax:  [phone]  DEA #:  [id number]

## 2020-09-30 ENCOUNTER — Other Ambulatory Visit: Payer: Self-pay

## 2020-10-18 ENCOUNTER — Other Ambulatory Visit: Payer: Self-pay

## 2020-10-27 ENCOUNTER — Telehealth: Payer: Self-pay

## 2020-10-27 NOTE — Telephone Encounter (Signed)
-----   Message from Missy Sabins, RN sent at 07/27/2020  3:02 PM EST ----- Regarding: Labs Repeat LFTs, order in epic.

## 2020-10-27 NOTE — Telephone Encounter (Signed)
Spoke with patient's wife to remind her that patient is due for repeat labs at this time. No appointment is necessary. Patient's wife is aware that he can stop by the lab in the basement at his convenience between 7:30 AM - 5 PM, Monday through Friday. I have provided patient's wife with the address to the office. Patient's wife verbalized understanding and had no concerns at the end of the call.

## 2020-10-31 ENCOUNTER — Other Ambulatory Visit: Payer: Self-pay

## 2020-10-31 DIAGNOSIS — K7010 Alcoholic hepatitis without ascites: Secondary | ICD-10-CM

## 2020-10-31 DIAGNOSIS — R748 Abnormal levels of other serum enzymes: Secondary | ICD-10-CM

## 2020-10-31 LAB — HEPATIC FUNCTION PANEL
ALT: 14 U/L (ref 0–53)
AST: 12 U/L (ref 0–37)
Albumin: 4.2 g/dL (ref 3.5–5.2)
Alkaline Phosphatase: 86 U/L (ref 39–117)
Bilirubin, Direct: 0.1 mg/dL (ref 0.0–0.3)
Total Bilirubin: 0.4 mg/dL (ref 0.2–1.2)
Total Protein: 7.2 g/dL (ref 6.0–8.3)

## 2020-11-01 ENCOUNTER — Other Ambulatory Visit: Payer: Self-pay

## 2020-11-01 DIAGNOSIS — R748 Abnormal levels of other serum enzymes: Secondary | ICD-10-CM

## 2020-11-01 DIAGNOSIS — K7010 Alcoholic hepatitis without ascites: Secondary | ICD-10-CM

## 2020-11-23 ENCOUNTER — Other Ambulatory Visit: Payer: Self-pay

## 2020-11-24 ENCOUNTER — Other Ambulatory Visit: Payer: Self-pay

## 2020-12-14 ENCOUNTER — Encounter: Payer: Self-pay | Admitting: Family

## 2020-12-22 ENCOUNTER — Other Ambulatory Visit: Payer: Self-pay | Admitting: Family

## 2020-12-22 DIAGNOSIS — E119 Type 2 diabetes mellitus without complications: Secondary | ICD-10-CM

## 2020-12-26 ENCOUNTER — Other Ambulatory Visit: Payer: Self-pay | Admitting: Family

## 2020-12-26 ENCOUNTER — Other Ambulatory Visit: Payer: Self-pay

## 2020-12-26 DIAGNOSIS — E119 Type 2 diabetes mellitus without complications: Secondary | ICD-10-CM

## 2020-12-26 MED ORDER — METFORMIN HCL ER 500 MG PO TB24
1000.0000 mg | ORAL_TABLET | Freq: Every day | ORAL | 0 refills | Status: DC
  Filled 2020-12-26: qty 60, 30d supply, fill #0
  Filled 2021-01-29: qty 60, 30d supply, fill #1

## 2020-12-27 ENCOUNTER — Other Ambulatory Visit: Payer: Self-pay

## 2021-01-30 ENCOUNTER — Other Ambulatory Visit: Payer: Self-pay

## 2021-02-16 NOTE — Progress Notes (Signed)
Patient ID: Derek Cruz, male    DOB: [birth year]  MRN: [id number]  CC: Annual Physical Exam   Subjective: Derek Cruz is a 55 y.o. male who presents for annual physical exam.   His concerns today include:   DIABETES TYPE 2 FOLLOW-UP: 09/14/2020: - Hemoglobin A1c at goal today at 5.7%, goal < 7%. This is improved from previous A1c of 6.9% on 05/08/2020. Next hemoglobin A1c due July 2022. - Will change Metformin to Metformin XR at the same dosage to hopefully curb gastrointestinal side effect.   02/20/2021: Doing well on current regimen. No side effects. No issues/concerns.    Patient Active Problem List   Diagnosis Date Noted   Elevated liver enzymes    Alcohol abuse    Liver failure ([place]) 56/81/2751   Alcoholic hepatitis    Hyponatremia    Polycythemia    Hemoglobin A1c less than 7.0% 07/16/2019   Neck pain 03/31/2019   Muscle strain 03/31/2019   Undifferentiated abdominal pain 03/31/2019   Gastroesophageal reflux disease without esophagitis 11/24/2018   Chest discomfort 11/24/2018   Essential hypertension 11/24/2018   Pre-diabetes 11/24/2018   Dyslipidemia 10/06/2016   Chest pain 09/26/2016   Diabetes mellitus type 2, controlled ([place]) 09/26/2016     Current Outpatient Medications on File Prior to Visit  Medication Sig Dispense Refill   lisinopril (ZESTRIL) 20 MG tablet TAKE 1 TABLET (20 MG TOTAL) BY MOUTH DAILY. 30 tablet 1   pantoprazole (PROTONIX) 40 MG tablet TAKE 1 TABLET (40 MG TOTAL) BY MOUTH DAILY AS NEEDED. 30 tablet 2   thiamine 100 MG tablet Take 1 tablet (100 mg total) by mouth daily. 30 tablet 0   No current facility-administered medications on file prior to visit.    No Known Allergies  Social History   Socioeconomic History   Marital status: Married    Spouse name: Not on file   Number of children: Not on file   Years of education: Not on file   Highest education level: Not on file  Occupational History   Not  on file  Tobacco Use   Smoking status: Former   Smokeless tobacco: Never  Vaping Use   Vaping Use: Never used  Substance and Sexual Activity   Alcohol use: Not Currently    Comment: 6 pack a day, none now 06-07-2020   Drug use: No   Sexual activity: Not on file  Other Topics Concern   Not on file  Social History Narrative   Not on file   Social Determinants of Health   Financial Resource Strain: Not on file  Food Insecurity: Not on file  Transportation Needs: Not on file  Physical Activity: Not on file  Stress: Not on file  Social Connections: Not on file  Intimate Partner Violence: Not on file    Family History  Problem Relation Age of Onset   Diabetes Mellitus II Neg Hx    Stomach cancer Neg Hx    Colon cancer Neg Hx     No past surgical history on file.  ROS: Review of Systems Negative except as stated above  PHYSICAL EXAM: BP 124/81 (BP Location: Left Arm, Patient Position: Sitting, Cuff Size: Normal)   Pulse 66   Temp 98.3 F (36.8 C)   Resp 16   Ht 5' 5.98" (1.676 m)   Wt 168 lb 12.8 oz (76.6 kg)   SpO2 96%   BMI 27.26 kg/m  Physical Exam HENT:     Head: Normocephalic  and atraumatic.     Right Ear: Tympanic membrane, ear canal and external ear normal.     Left Ear: Tympanic membrane, ear canal and external ear normal.  Eyes:     Extraocular Movements: Extraocular movements intact.     Conjunctiva/sclera: Conjunctivae normal.     Pupils: Pupils are equal, round, and reactive to light.  Cardiovascular:     Rate and Rhythm: Normal rate and regular rhythm.     Pulses: Normal pulses.     Heart sounds: Normal heart sounds.  Pulmonary:     Effort: Pulmonary effort is normal.     Breath sounds: Normal breath sounds.  Abdominal:     General: Bowel sounds are normal.     Palpations: Abdomen is soft.  Genitourinary:    Comments: Patient declined exam.  Musculoskeletal:        General: Normal range of motion.     Cervical back: Normal range of motion  and neck supple.  Skin:    General: Skin is warm and dry.     Capillary Refill: Capillary refill takes less than 2 seconds.  Neurological:     General: No focal deficit present.     Mental Status: He is alert and oriented to person, place, and time.  Psychiatric:        Mood and Affect: Mood normal.        Behavior: Behavior normal.   Diabetic foot exam was performed with the following findings:   No deformities, ulcerations, or other skin breakdown Normal sensation of 10g monofilament Intact posterior tibialis and dorsalis pedis pulses     ASSESSMENT AND PLAN: 1. Annual physical exam: - Counseled on 150 minutes of exercise per week as tolerated, healthy eating (including decreased daily intake of saturated fats, cholesterol, added sugars, sodium), STI prevention, and routine healthcare maintenance.  2. Screening for metabolic disorder: - [id number]+QDUK to check kidney function, liver function, and electrolyte balance.  - CMP14+EGFR  3. Screening for deficiency anemia: - CBC to screen for anemia. - CBC  4. Screening cholesterol level: - Lipid panel to screen for high cholesterol.  - Lipid panel  5. Thyroid disorder screen: - TSH to check thyroid function.  - TSH  6. Colon cancer screening: - Referral to Gastroenterology for colon cancer screening by colonoscopy. - Ambulatory referral to Gastroenterology  7. Type 2 diabetes mellitus without complication, without long-term current use of insulin ([place]): - Hemoglobin A1c today at goal at 6.1, goal < 7%. This is increased from previous of 5.7% on 09/14/2020. Next hemoglobin A1c due January 2023.  - Continue Metformin as prescribed.  - Discussed the importance of healthy eating habits, low-carbohydrate diet, low-sugar diet, regular aerobic exercise (at least 150 minutes a week as tolerated) and medication compliance to achieve or maintain control of diabetes. - Referral to Ophthalmology for diabetic eye exam.  - Follow-up with  primary provider in 3 months or sooner if needed.  - POCT glycosylated hemoglobin (Hb A1C) - Ambulatory referral to Ophthalmology - metFORMIN (GLUCOPHAGE-XR) 500 MG 24 hr tablet; Take 2 tablets (1,000 mg total) by mouth daily with breakfast.  Dispense: 180 tablet; Refill: 0  8. Need for influenza vaccination: - Administered today in office.  - Flu Vaccine QUAD 54moIM (Fluarix, Fluzone & Alfiuria Quad PF)  9. Need for shingles vaccine: - Administered today in office.  - Varicella-zoster vaccine IM    Patient was given the opportunity to ask questions.  Patient verbalized understanding of the plan and was able  to repeat key elements of the plan. Patient was given clear instructions to go to Emergency Department or return to medical center if symptoms don't improve, worsen, or new problems develop.The patient verbalized understanding.   Orders Placed This Encounter  Procedures   Varicella-zoster vaccine IM   Flu Vaccine QUAD 41moIM (Fluarix, Fluzone & Alfiuria Quad PF)   CBC   Lipid panel   TSH   CMP14+EGFR   Ambulatory referral to Ophthalmology   Ambulatory referral to Gastroenterology   POCT glycosylated hemoglobin (Hb A1C)     Requested Prescriptions   Signed Prescriptions Disp Refills   metFORMIN (GLUCOPHAGE-XR) 500 MG 24 hr tablet 180 tablet 0    Sig: Take 2 tablets (1,000 mg total) by mouth daily with breakfast.    Return in about 1 year (around 02/20/2022) for Physical per patient preference and 3 months diabetes .  ACamillia Herter NP

## 2021-02-20 ENCOUNTER — Encounter (INDEPENDENT_AMBULATORY_CARE_PROVIDER_SITE_OTHER): Payer: Self-pay

## 2021-02-20 ENCOUNTER — Other Ambulatory Visit: Payer: Self-pay

## 2021-02-20 ENCOUNTER — Ambulatory Visit (INDEPENDENT_AMBULATORY_CARE_PROVIDER_SITE_OTHER): Payer: Self-pay | Admitting: Family

## 2021-02-20 VITALS — BP 124/81 | HR 66 | Temp 98.3°F | Resp 16 | Ht 65.98 in | Wt 168.8 lb

## 2021-02-20 DIAGNOSIS — Z13228 Encounter for screening for other metabolic disorders: Secondary | ICD-10-CM

## 2021-02-20 DIAGNOSIS — Z13220 Encounter for screening for lipoid disorders: Secondary | ICD-10-CM

## 2021-02-20 DIAGNOSIS — Z130 Encounter for screening for diseases of the blood and blood-forming organs and certain disorders involving the immune mechanism: Secondary | ICD-10-CM

## 2021-02-20 DIAGNOSIS — Z789 Other specified health status: Secondary | ICD-10-CM

## 2021-02-20 DIAGNOSIS — E119 Type 2 diabetes mellitus without complications: Secondary | ICD-10-CM

## 2021-02-20 DIAGNOSIS — Z23 Encounter for immunization: Secondary | ICD-10-CM

## 2021-02-20 DIAGNOSIS — Z1329 Encounter for screening for other suspected endocrine disorder: Secondary | ICD-10-CM

## 2021-02-20 DIAGNOSIS — Z1211 Encounter for screening for malignant neoplasm of colon: Secondary | ICD-10-CM

## 2021-02-20 DIAGNOSIS — Z0000 Encounter for general adult medical examination without abnormal findings: Secondary | ICD-10-CM

## 2021-02-20 LAB — POCT GLYCOSYLATED HEMOGLOBIN (HGB A1C): Hemoglobin A1C: 6.1 % — AB (ref 4.0–5.6)

## 2021-02-20 MED ORDER — METFORMIN HCL ER 500 MG PO TB24
1000.0000 mg | ORAL_TABLET | Freq: Every day | ORAL | 0 refills | Status: DC
  Filled 2021-02-20: qty 180, 90d supply, fill #0
  Filled 2021-03-01: qty 60, 30d supply, fill #0
  Filled 2021-04-04: qty 60, 30d supply, fill #1

## 2021-02-20 NOTE — Patient Instructions (Signed)
Cuidados preventivos en los hombres de 40 a 64 aos de edad Preventive Care 64-55 Years Old, Male Los cuidados preventivos hacen referencia a las opciones en cuanto al estilo de vida y a las visitas al mdico, las cuales pueden promover la salud y Counsellor. Esto puede comprender lo siguiente: Un examen fsico anual. Esto tambin se conoce como visita de control de bienestar anual. Exmenes dentales y oculares de [place] regular. Vacunas. Estudios para Hospital doctor. Elecciones para un estilo de vida saludable, por ejemplo: Seguir una dieta saludable. Practicar actividad fsica con regularidad. No consumir drogas ni productos que contengan nicotina y tabaco. Limitar el consumo de bebidas alcohlicas. Qu puedo esperar para mi visita de cuidado preventivo? Examen fsico El mdico revisar lo siguiente: Diplomatic Services operational officer y [place]. Estos pueden usarse para calcular el IMC (ndice de masa corporal). El [facility] es una medicin que indica si tiene un peso saludable. Frecuencia cardaca y presin arterial. Temperatura corporal. Piel para detectar manchas anormales. Asesoramiento Su mdico puede preguntarle acerca de: Problemas mdicos pasados. Antecedentes mdicos familiares. Consumo de tabaco, alcohol y drogas. Su bienestar emocional. Training and development officer y las relaciones personales. Su actividad sexual. Hbitos de alimentacin, ejercicio y sueo. Su trabajo y [place] laboral. Acceso a armas de fuego. Qu vacunas necesito? Las vacunas se aplican a varias edades, segn un calendario. El [employer] recomendar vacunas segn su edad, sus antecedentes mdicos, su estilo de vida y [address], como los viajes o el lugar donde trabaja. Qu pruebas necesito? Anlisis de [employer] de lpidos y colesterol. Estos se pueden verificar cada 5 aos, o ms a menudo, si usted tiene ms de 50 aos de edad. Anlisis de hepatitis C. Anlisis de hepatitis B. Pruebas de deteccin Pruebas de  deteccin de cncer de pulmn. Es posible que se le realice esta prueba de deteccin a partir de los 55 aos de edad, si ha fumado durante 30 aos un paquete diario y sigue fumando o dej el hbito en algn momento en los ltimos 15 aos. Examen de deteccin del cncer de prstata. Las recomendaciones variarn segn sus antecedentes familiares y Civil Service fast streamer. Examen de genitales para detectar cncer testicular o hernias. Pruebas de Airline pilot de Building services engineer. Todos los adultos a partir de los 50 aos de edad y [place] 75 aos de edad deben hacerse esta prueba de deteccin. El mdico puede recomendarle las pruebas de deteccin a partir de los 45 aos de edad si corre un mayor riesgo. Le realizarn pruebas cada 1 a 10 aos, segn los [place] y el tipo de prueba de Airline pilot. Pruebas de deteccin de la diabetes. Esto se Physiological scientist un control del azcar en la sangre (glucosa) despus de no haber comido durante un periodo de tiempo (ayuno). Es posible que se le realice esta prueba cada 1 a 3 aos. Pruebas de enfermedades de transmisin sexual (ETS), si est en riesgo. Siga estas instrucciones en su casa: Comida y bebida  Siga una dieta que incluya frutas y verduras frescas, cereales integrales, protenas magras y productos lcteos descremados. Tome los suplementos vitamnicos y [employer] se lo haya indicado el mdico. No beba alcohol si el mdico se lo prohbe. Si bebe alcohol: Limite la cantidad que consume de 0 a 2 medidas por da. Est atento a la cantidad de alcohol que hay en las bebidas que toma. En los [address], una medida equivale a una botella de cerveza de 12 oz (355 ml), un vaso de vino de 5 oz (148 ml)  o un vaso de una bebida alcohlica de alta graduacin de 1 oz (44 ml). Estilo de [employer] y las encas a diario. Cepllese los dientes a la maana y a la noche con pasta dental con fluoruro. Use hilo dental una vez al da. Mantngase activo.  Haga al menos 30 minutos de ejercicio, 5 o ms [address]. No consuma ningn producto que contenga nicotina o tabaco, como cigarrillos, cigarrillos electrnicos y tabaco de Theatre manager. Si necesita ayuda para dejar de fumar, consulte al mdico. No consuma drogas. Si es sexualmente activo, practique sexo seguro. Use un condn u otra forma de proteccin para prevenir las ITS (infecciones de transmisin sexual). Si el mdico se lo indic, tome una dosis baja de aspirina diariamente a partir de los 50 aos de [place]. Encuentre formas saludables de lidiar con el estrs tales como: Meditacin, yoga o Optometrist. Lleve un diario personal. Hable con una persona confiable. Pase tiempo con amigos y familiares. Seguridad [place] siempre el cinturn de seguridad al conducir o viajar en un vehculo. No conduzca: Si ha estado bebiendo alcohol. No viaje con un conductor que ha estado bebiendo. Si est cansado o distrado. Mientras est enviando mensajes de texto. Use un casco y otros equipos de proteccin durante las actividades deportivas. Si tiene armas de fuego en su casa, asegrese de seguir todos los procedimientos de seguridad correspondientes. Cundo volver? Acuda al mdico una vez al ao para una visita anual de control de bienestar. Pregntele al mdico con qu frecuencia debe realizarse un control de la vista y los dientes. Mantenga su esquema de vacunacin al da. Esta informacin no tiene Theme park manager el consejo del mdico. Asegrese de hacerle al mdico cualquier pregunta que tenga. Document Revised: 04/15/2019 Document Reviewed: 04/15/2019 Elsevier Patient Education  2022 [employer].

## 2021-02-20 NOTE — Progress Notes (Signed)
.  Pt presents for annual physical exam  

## 2021-02-21 ENCOUNTER — Other Ambulatory Visit: Payer: Self-pay | Admitting: Family

## 2021-02-21 ENCOUNTER — Other Ambulatory Visit: Payer: Self-pay

## 2021-02-21 DIAGNOSIS — E785 Hyperlipidemia, unspecified: Secondary | ICD-10-CM

## 2021-02-21 LAB — CMP14+EGFR
ALT: 15 IU/L (ref 0–44)
AST: 14 IU/L (ref 0–40)
Albumin/Globulin Ratio: 2 (ref 1.2–2.2)
Albumin: 4.6 g/dL (ref 3.8–4.9)
Alkaline Phosphatase: 91 IU/L (ref 44–121)
BUN/Creatinine Ratio: 12 (ref 9–20)
BUN: 8 mg/dL (ref 6–24)
Bilirubin Total: 0.3 mg/dL (ref 0.0–1.2)
CO2: 20 mmol/L (ref 20–29)
Calcium: 9.6 mg/dL (ref 8.7–10.2)
Chloride: 105 mmol/L (ref 96–106)
Creatinine, Ser: 0.66 mg/dL — ABNORMAL LOW (ref 0.76–1.27)
Globulin, Total: 2.3 g/dL (ref 1.5–4.5)
Glucose: 96 mg/dL (ref 70–99)
Potassium: 4.4 mmol/L (ref 3.5–5.2)
Sodium: 142 mmol/L (ref 134–144)
Total Protein: 6.9 g/dL (ref 6.0–8.5)
eGFR: 111 mL/min/{1.73_m2} (ref >59)

## 2021-02-21 LAB — CBC
Hematocrit: 46.6 % (ref 37.5–51.0)
Hemoglobin: 15.6 g/dL (ref 13.0–17.7)
MCH: 30.2 pg (ref 26.6–33.0)
MCHC: 33.5 g/dL (ref 31.5–35.7)
MCV: 90 fL (ref 79–97)
Platelets: 287 10*3/uL (ref 150–450)
RBC: 5.17 x10E6/uL (ref 4.14–5.80)
RDW: 13.9 % (ref 11.6–15.4)
WBC: 7 10*3/uL (ref 3.4–10.8)

## 2021-02-21 LAB — LIPID PANEL
Chol/HDL Ratio: 6.6 ratio — ABNORMAL HIGH (ref 0.0–5.0)
Cholesterol, Total: 245 mg/dL — ABNORMAL HIGH (ref 100–199)
HDL: 37 mg/dL — ABNORMAL LOW (ref >39)
LDL Chol Calc (NIH): 167 mg/dL — ABNORMAL HIGH (ref 0–99)
Triglycerides: 219 mg/dL — ABNORMAL HIGH (ref 0–149)
VLDL Cholesterol Cal: 41 mg/dL — ABNORMAL HIGH (ref 5–40)

## 2021-02-21 LAB — TSH: TSH: 2.02 u[IU]/mL (ref 0.450–4.500)

## 2021-02-21 MED ORDER — ATORVASTATIN CALCIUM 40 MG PO TABS
40.0000 mg | ORAL_TABLET | Freq: Every day | ORAL | 1 refills | Status: DC
  Filled 2021-02-21: qty 30, 30d supply, fill #0

## 2021-02-21 NOTE — Progress Notes (Signed)
Please call patient with update.   Kidney function normal.   Liver function normal.   Thyroid function normal.   No anemia.   Diabetes discussed in office.   Cholesterol higher than expected. High cholesterol may increase risk of heart attack and/or stroke. Consider eating more fruits, vegetables, and lean baked meats such as chicken or fish. Moderate intensity exercise at least 150 minutes as tolerated per week may help as well.   Begin Atorvastatin (Lipitor) as prescribed for high cholesterol. Encouraged to recheck cholesterol at lab only visit in 6 to 8 weeks.   The following is for provider reference only: The 10-year ASCVD risk score (Arnett DK, et al., 2019) is: 18.6%   Values used to calculate the score:     Age: 55 years     Sex: Male     Is Non-Hispanic African American: No     Diabetic: Yes     Tobacco smoker: No     Systolic Blood Pressure: 124 mmHg     Is BP treated: Yes     HDL Cholesterol: 37 mg/dL     Total Cholesterol: 245 mg/dL

## 2021-02-28 ENCOUNTER — Other Ambulatory Visit: Payer: Self-pay

## 2021-03-01 ENCOUNTER — Other Ambulatory Visit: Payer: Self-pay

## 2021-04-04 ENCOUNTER — Other Ambulatory Visit: Payer: Self-pay

## 2021-04-04 ENCOUNTER — Other Ambulatory Visit: Payer: Self-pay | Admitting: Family

## 2021-04-04 DIAGNOSIS — I10 Essential (primary) hypertension: Secondary | ICD-10-CM

## 2021-04-05 ENCOUNTER — Other Ambulatory Visit: Payer: Self-pay

## 2021-04-05 MED ORDER — LISINOPRIL 20 MG PO TABS
ORAL_TABLET | Freq: Every day | ORAL | 0 refills | Status: DC
  Filled 2021-04-05: qty 30, 30d supply, fill #0

## 2021-04-07 ENCOUNTER — Other Ambulatory Visit: Payer: Self-pay

## 2021-04-22 NOTE — Progress Notes (Signed)
Patient ID: Derek Cruz, male    DOB: [birth year]  MRN: [id number]  CC: Hyperlipidemia Follow-Up   Subjective: Derek Cruz is a 55 y.o. male who presents for hyperlioidemia follow-up.   His concerns today include:  HYPERLIPIDEMIA FOLLOW-UP: Patient reports he was unaware that he was to be taking cholesterol medication. Reports he received a call from our office a couple months ago to call the office. Reports when he called the office back he was unable to speak to anyone.    Patient Active Problem List   Diagnosis Date Noted   Elevated liver enzymes    Alcohol abuse    Liver failure (HCC) 05/07/2020   Alcoholic hepatitis    Hyponatremia    Polycythemia    Hemoglobin A1c less than 7.0% 07/16/2019   Neck pain 03/31/2019   Muscle strain 03/31/2019   Undifferentiated abdominal pain 03/31/2019   Gastroesophageal reflux disease without esophagitis 11/24/2018   Chest discomfort 11/24/2018   Essential hypertension 11/24/2018   Pre-diabetes 11/24/2018   Hyperlipidemia 10/06/2016   Chest pain 09/26/2016   Diabetes mellitus type 2, controlled (HCC) 09/26/2016     Current Outpatient Medications on File Prior to Visit  Medication Sig Dispense Refill   lisinopril (ZESTRIL) 20 MG tablet TAKE 1 TABLET (20 MG TOTAL) BY MOUTH DAILY. 30 tablet 0   metFORMIN (GLUCOPHAGE-XR) 500 MG 24 hr tablet Take 2 tablets (1,000 mg total) by mouth daily with breakfast. 180 tablet 0   pantoprazole (PROTONIX) 40 MG tablet TAKE 1 TABLET (40 MG TOTAL) BY MOUTH DAILY AS NEEDED. 30 tablet 2   thiamine 100 MG tablet Take 1 tablet (100 mg total) by mouth daily. 30 tablet 0   No current facility-administered medications on file prior to visit.    No Known Allergies  Social History   Socioeconomic History   Marital status: Married    Spouse name: Not on file   Number of children: Not on file   Years of education: Not on file   Highest education level: Not on file   Occupational History   Not on file  Tobacco Use   Smoking status: Former   Smokeless tobacco: Never  Vaping Use   Vaping Use: Never used  Substance and Sexual Activity   Alcohol use: Not Currently    Comment: 6 pack a day, none now 06-07-2020   Drug use: No   Sexual activity: Not on file  Other Topics Concern   Not on file  Social History Narrative   Not on file   Social Determinants of Health   Financial Resource Strain: Not on file  Food Insecurity: Not on file  Transportation Needs: Not on file  Physical Activity: Not on file  Stress: Not on file  Social Connections: Not on file  Intimate Partner Violence: Not on file    Family History  Problem Relation Age of Onset   Diabetes Mellitus II Neg Hx    Stomach cancer Neg Hx    Colon cancer Neg Hx     No past surgical history on file.  ROS: Review of Systems Negative except as stated above  PHYSICAL EXAM: BP 109/74 (BP Location: Left Arm, Patient Position: Sitting, Cuff Size: Normal)   Pulse 78   Temp 98.3 F (36.8 C)   Resp 18   Ht 5' 5.98" (1.676 m)   Wt 172 lb 9.6 oz (78.3 kg)   SpO2 98%   BMI 27.87 kg/m   Physical Exam HENT:  Head: Normocephalic and atraumatic.  Eyes:     Extraocular Movements: Extraocular movements intact.     Conjunctiva/sclera: Conjunctivae normal.     Pupils: Pupils are equal, round, and reactive to light.  Cardiovascular:     Rate and Rhythm: Normal rate and regular rhythm.     Pulses: Normal pulses.     Heart sounds: Normal heart sounds.  Pulmonary:     Effort: Pulmonary effort is normal.     Breath sounds: Normal breath sounds.  Musculoskeletal:     Cervical back: Normal range of motion and neck supple.  Neurological:     General: No focal deficit present.     Mental Status: He is alert and oriented to person, place, and time.  Psychiatric:        Mood and Affect: Mood normal.        Behavior: Behavior normal.   ASSESSMENT AND PLAN: 1. Hyperlipidemia,  unspecified hyperlipidemia type: - Practice low-fat heart healthy diet and at least 150 minutes of moderate intensity exercise weekly as tolerated.  - Begin Atorvastatin as prescribed.  - Follow-up with primary provider as scheduled.  - atorvastatin (LIPITOR) 40 MG tablet; Take 1 tablet (40 mg total) by mouth daily.  Dispense: 120 tablet; Refill: 0  2. Encounter to discuss test results: - Discussed with patient lab results from 02/20/2021. Patient verbalized understanding.   3. Need for pneumococcal vaccination: - Administered today in office.  - Pneumococcal conjugate vaccine 20-valent  4. Need for shingles vaccine: - Administered today in office.  - Varicella-zoster vaccine IM   Patient was given the opportunity to ask questions.  Patient verbalized understanding of the plan and was able to repeat key elements of the plan. Patient was given clear instructions to go to Emergency Department or return to medical center if symptoms don't improve, worsen, or new problems develop.The patient verbalized understanding.   Orders Placed This Encounter  Procedures   Varicella-zoster vaccine IM   Pneumococcal conjugate vaccine 20-valent     Requested Prescriptions   Signed Prescriptions Disp Refills   atorvastatin (LIPITOR) 40 MG tablet 120 tablet 0    Sig: Take 1 tablet (40 mg total) by mouth daily.    Follow-up with primary provider as scheduled.  Rema Fendt, NP

## 2021-04-24 ENCOUNTER — Ambulatory Visit: Payer: Self-pay

## 2021-04-25 ENCOUNTER — Other Ambulatory Visit: Payer: Self-pay

## 2021-04-25 ENCOUNTER — Ambulatory Visit (INDEPENDENT_AMBULATORY_CARE_PROVIDER_SITE_OTHER): Payer: Self-pay | Admitting: Family

## 2021-04-25 VITALS — BP 109/74 | HR 78 | Temp 98.3°F | Resp 18 | Ht 65.98 in | Wt 172.6 lb

## 2021-04-25 DIAGNOSIS — Z23 Encounter for immunization: Secondary | ICD-10-CM

## 2021-04-25 DIAGNOSIS — E119 Type 2 diabetes mellitus without complications: Secondary | ICD-10-CM

## 2021-04-25 DIAGNOSIS — E785 Hyperlipidemia, unspecified: Secondary | ICD-10-CM

## 2021-04-25 DIAGNOSIS — Z712 Person consulting for explanation of examination or test findings: Secondary | ICD-10-CM

## 2021-04-25 MED ORDER — ATORVASTATIN CALCIUM 40 MG PO TABS
40.0000 mg | ORAL_TABLET | Freq: Every day | ORAL | 0 refills | Status: DC
  Filled 2021-04-25: qty 30, 30d supply, fill #0
  Filled 2021-06-01: qty 30, 30d supply, fill #1
  Filled 2021-06-02: qty 30, 30d supply, fill #0

## 2021-04-25 NOTE — Progress Notes (Signed)
Pt presents for follow-up, needs 2nd shingles and pneumococcal vaccine

## 2021-04-27 ENCOUNTER — Other Ambulatory Visit: Payer: Self-pay

## 2021-05-03 ENCOUNTER — Telehealth: Payer: Self-pay

## 2021-05-03 NOTE — Telephone Encounter (Signed)
-----   Message from Missy Sabins, RN sent at 11/01/2020  3:27 PM EDT ----- Regarding: Labs Repeat hepatic function panel, order in epic.

## 2021-05-03 NOTE — Telephone Encounter (Signed)
Thanks, if LFTs normal do not need labs again now. I would like to see him back in the office for routine follow up though if you can help coordinate. thanks

## 2021-05-03 NOTE — Telephone Encounter (Signed)
Dr. Adela Lank, pt is due for repeat LFT's at this time. He had labs with his PCP 02/2021, LFT's remained normal (results available in epic). Please advise, thanks

## 2021-05-03 NOTE — Telephone Encounter (Signed)
Called and spoke with patient's wife in regards to results and recommendations. Pt has been scheduled for a follow up appt with Dr. Adela Lank on Tuesday, 06/20/21 at 3 pm. Pt's wife verbalized understanding of all information and had no concerns at the end of the call.   Letter mailed to patient with appointment information.

## 2021-05-09 ENCOUNTER — Other Ambulatory Visit: Payer: Self-pay | Admitting: Family Medicine

## 2021-05-09 DIAGNOSIS — I10 Essential (primary) hypertension: Secondary | ICD-10-CM

## 2021-05-10 ENCOUNTER — Other Ambulatory Visit: Payer: Self-pay

## 2021-05-10 MED ORDER — LISINOPRIL 20 MG PO TABS
ORAL_TABLET | Freq: Every day | ORAL | 0 refills | Status: DC
  Filled 2021-05-10: qty 30, 30d supply, fill #0

## 2021-05-12 ENCOUNTER — Other Ambulatory Visit: Payer: Self-pay

## 2021-06-02 ENCOUNTER — Other Ambulatory Visit: Payer: Self-pay

## 2021-06-08 ENCOUNTER — Other Ambulatory Visit: Payer: Self-pay

## 2021-06-19 NOTE — Progress Notes (Addendum)
Patient ID: Derek Cruz, male    DOB: [birth year]  MRN: [id number]  CC: Diabetes Follow-Up  Subjective: Derek Cruz is a 56 y.o. male who presents for diabetes follow-up.    His concerns today include:  DIABETES TYPE 2 FOLLOW-UP: 02/20/2021: - Continue Metformin as prescribed.   06/20/2021: Doing well on Metformin. No issues/concerns. Not checking blood sugars at home doesn't know how to use equipment.   2. HYPERTENSION FOLLOW-UP:  Doing well on current regimen. No side effects. No issues/concerns. Denies chest pain and shortness of breath. Not checking blood pressures at home.  3. HYPERLIPIDEMIA FOLLOW-UP: Doing well on current Atorvastatin. No issues/concerns.  4. ACID REFLUX FOLLOW-UP: Needs refills of Protonix. No issues/concerns.  5. INSOMNIA: Began 2 days ago. Night sweats. Having some family problems doesn't want to discuss more.   Depression screen [facility] 2/9 06/20/2021 04/25/2021 09/14/2020 01/04/2020 07/13/2019  Decreased Interest 0 0 0 0 0  Down, Depressed, Hopeless 0 0 0 0 0  PHQ - 2 Score 0 0 0 0 0  Altered sleeping - - 0 - -  Tired, decreased energy - - 0 - -  Change in appetite - - 0 - -  Feeling bad or failure about yourself  - - 0 - -  Trouble concentrating - - 0 - -  Moving slowly or fidgety/restless - - 0 - -  Suicidal thoughts - - 0 - -  PHQ-9 Score - - 0 - -  Difficult doing work/chores - - Not difficult at all - -    Patient Active Problem List   Diagnosis Date Noted   Elevated liver enzymes    Alcohol abuse    Liver failure (HCC) 123456   Alcoholic hepatitis    Hyponatremia    Polycythemia    Hemoglobin A1c less than 7.0% 07/16/2019   Neck pain 03/31/2019   Muscle strain 03/31/2019   Undifferentiated abdominal pain 03/31/2019   Gastroesophageal reflux disease without esophagitis 11/24/2018   Chest discomfort 11/24/2018   Essential hypertension 11/24/2018   Pre-diabetes 11/24/2018   Hyperlipidemia 10/06/2016    Chest pain 09/26/2016   Diabetes mellitus type 2, controlled ([place]) 09/26/2016     Current Outpatient Medications on File Prior to Visit  Medication Sig Dispense Refill   atorvastatin (LIPITOR) 40 MG tablet Take 1 tablet (40 mg total) by mouth daily. 120 tablet 0   lisinopril (ZESTRIL) 20 MG tablet TAKE 1 TABLET (20 MG TOTAL) BY MOUTH DAILY. 30 tablet 0   metFORMIN (GLUCOPHAGE-XR) 500 MG 24 hr tablet Take 2 tablets (1,000 mg total) by mouth daily with breakfast. 180 tablet 0   pantoprazole (PROTONIX) 40 MG tablet TAKE 1 TABLET (40 MG TOTAL) BY MOUTH DAILY AS NEEDED. 30 tablet 2   thiamine 100 MG tablet Take 1 tablet (100 mg total) by mouth daily. 30 tablet 0   No current facility-administered medications on file prior to visit.    No Known Allergies  Social History   Socioeconomic History   Marital status: Married    Spouse name: Not on file   Number of children: Not on file   Years of education: Not on file   Highest education level: Not on file  Occupational History   Not on file  Tobacco Use   Smoking status: Former   Smokeless tobacco: Never  Vaping Use   Vaping Use: Never used  Substance and Sexual Activity   Alcohol use: Not Currently    Comment: 6 pack a day,  none now 06-07-2020   Drug use: No   Sexual activity: Not on file  Other Topics Concern   Not on file  Social History Narrative   Not on file   Social Determinants of Health   Financial Resource Strain: Not on file  Food Insecurity: Not on file  Transportation Needs: Not on file  Physical Activity: Not on file  Stress: Not on file  Social Connections: Not on file  Intimate Partner Violence: Not on file    Family History  Problem Relation Age of Onset   Diabetes Mellitus II Neg Hx    Stomach cancer Neg Hx    Colon cancer Neg Hx     No past surgical history on file.  ROS: Review of Systems Negative except as stated above  PHYSICAL EXAM: BP 127/79 (BP Location: Left Arm, Patient Position:  Sitting, Cuff Size: Normal)    Pulse 84    Temp 98.3 F (36.8 C)    Resp 18    Ht 5' 5.98" (1.676 m)    Wt 164 lb (74.4 kg)    SpO2 97%    BMI 26.48 kg/m   Physical Exam HENT:     Head: Normocephalic and atraumatic.  Eyes:     Extraocular Movements: Extraocular movements intact.     Conjunctiva/sclera: Conjunctivae normal.     Pupils: Pupils are equal, round, and reactive to light.  Cardiovascular:     Rate and Rhythm: Normal rate and regular rhythm.     Pulses: Normal pulses.     Heart sounds: Normal heart sounds.  Pulmonary:     Effort: Pulmonary effort is normal.     Breath sounds: Normal breath sounds.  Musculoskeletal:     Cervical back: Normal range of motion and neck supple.  Neurological:     General: No focal deficit present.     Mental Status: He is alert and oriented to person, place, and time.  Psychiatric:        Mood and Affect: Mood normal.        Behavior: Behavior normal.    Results for orders placed or performed in visit on 06/20/21  POCT glycosylated hemoglobin (Hb A1C)  Result Value Ref Range   Hemoglobin A1C 6.4 (A) 4.0 - 5.6 %   HbA1c POC (<> result, manual entry)     HbA1c, POC (prediabetic range)     HbA1c, POC (controlled diabetic range)      ASSESSMENT AND PLAN: 1. Type 2 diabetes mellitus without complication, without long-term current use of insulin ([place]): - Hemoglobin A1c today at goal at 6.4%, goal < 7%.  - Continue Metformin as prescribed.  - Discussed the importance of healthy eating habits, low-carbohydrate diet, low-sugar diet, regular aerobic exercise (at least 150 minutes a week as tolerated) and medication compliance to achieve or maintain control of diabetes. - Follow-up with primary provider in 3 months or sooner if needed.  - POCT glycosylated hemoglobin (Hb A1C) - metFORMIN (GLUCOPHAGE-XR) 500 MG 24 hr tablet; Take 2 tablets (1,000 mg total) by mouth daily with breakfast.  Dispense: 180 tablet; Refill: 0  2. Essential (primary)  hypertension: - Continue Lisinopril as prescribed.  - Counseled on blood pressure goal of less than 130/80, low-sodium, DASH diet, medication compliance, 150 minutes of moderate intensity exercise per week as tolerated. Discussed medication compliance, adverse effects. - Follow-up with primary provider in 3 months or sooner if needed.  - lisinopril (ZESTRIL) 20 MG tablet; TAKE 1 TABLET (20 MG TOTAL) BY  MOUTH DAILY.  Dispense: 90 tablet; Refill: 0  3. Hyperlipidemia, unspecified hyperlipidemia type: - Continue Atorvastatin as prescribed.  - Update lipid panel.  - Follow-up with primary provider as scheduled. - Lipid Panel - atorvastatin (LIPITOR) 40 MG tablet; Take 1 tablet (40 mg total) by mouth daily.  Dispense: 120 tablet; Refill: 0  4. Gastroesophageal reflux disease without esophagitis: - Continue Pantoprazole as prescribed.  - Follow-up with primary provider as scheduled. - pantoprazole (PROTONIX) 40 MG tablet; TAKE 1 TABLET (40 MG TOTAL) BY MOUTH DAILY AS NEEDED.  Dispense: 90 tablet; Refill: 0  5. Insomnia, unspecified type: - Patient declined pharmacological therapy.  - Follow-up with primary provider as scheduled.  6. Language barrier: - [employer] participated during today's appointment. Interpreter Name: Luanna Salk, ID#[id number].  Patient was given the opportunity to ask questions.  Patient verbalized understanding of the plan and was able to repeat key elements of the plan. Patient was given clear instructions to go to Emergency Department or return to medical center if symptoms don't improve, worsen, or new problems develop.The patient verbalized understanding.   Orders Placed This Encounter  Procedures   Lipid Panel   POCT glycosylated hemoglobin (Hb A1C)     Requested Prescriptions   Pending Prescriptions Disp Refills   lisinopril (ZESTRIL) 20 MG tablet 90 tablet 0    Sig: TAKE 1 TABLET (20 MG TOTAL) BY MOUTH DAILY.   metFORMIN (GLUCOPHAGE-XR) 500 MG 24 hr  tablet 180 tablet 0    Sig: Take 2 tablets (1,000 mg total) by mouth daily with breakfast.   pantoprazole (PROTONIX) 40 MG tablet 90 tablet 0    Sig: TAKE 1 TABLET (40 MG TOTAL) BY MOUTH DAILY AS NEEDED.    Return in about 3 months (around 09/17/2021) for Follow-Up or next available hypertension, diabetes .  Camillia Herter, NP

## 2021-06-20 ENCOUNTER — Other Ambulatory Visit: Payer: Self-pay

## 2021-06-20 ENCOUNTER — Ambulatory Visit (INDEPENDENT_AMBULATORY_CARE_PROVIDER_SITE_OTHER): Payer: Self-pay | Admitting: Family

## 2021-06-20 ENCOUNTER — Ambulatory Visit: Payer: Self-pay | Admitting: Gastroenterology

## 2021-06-20 ENCOUNTER — Encounter (INDEPENDENT_AMBULATORY_CARE_PROVIDER_SITE_OTHER): Payer: Self-pay

## 2021-06-20 VITALS — BP 127/79 | HR 84 | Temp 98.3°F | Resp 18 | Ht 65.98 in | Wt 164.0 lb

## 2021-06-20 DIAGNOSIS — Z758 Other problems related to medical facilities and other health care: Secondary | ICD-10-CM

## 2021-06-20 DIAGNOSIS — K219 Gastro-esophageal reflux disease without esophagitis: Secondary | ICD-10-CM

## 2021-06-20 DIAGNOSIS — I10 Essential (primary) hypertension: Secondary | ICD-10-CM

## 2021-06-20 DIAGNOSIS — E785 Hyperlipidemia, unspecified: Secondary | ICD-10-CM

## 2021-06-20 DIAGNOSIS — G4700 Insomnia, unspecified: Secondary | ICD-10-CM

## 2021-06-20 DIAGNOSIS — E119 Type 2 diabetes mellitus without complications: Secondary | ICD-10-CM

## 2021-06-20 DIAGNOSIS — Z789 Other specified health status: Secondary | ICD-10-CM

## 2021-06-20 LAB — POCT GLYCOSYLATED HEMOGLOBIN (HGB A1C): Hemoglobin A1C: 6.4 % — AB (ref 4.0–5.6)

## 2021-06-20 MED ORDER — LISINOPRIL 20 MG PO TABS
ORAL_TABLET | Freq: Every day | ORAL | 0 refills | Status: DC
  Filled 2021-06-20: qty 30, 30d supply, fill #0
  Filled 2021-09-04: qty 30, 30d supply, fill #1

## 2021-06-20 MED ORDER — PANTOPRAZOLE SODIUM 40 MG PO TBEC
DELAYED_RELEASE_TABLET | ORAL | 0 refills | Status: DC
  Filled 2021-06-20: qty 30, 30d supply, fill #0

## 2021-06-20 MED ORDER — ATORVASTATIN CALCIUM 40 MG PO TABS
40.0000 mg | ORAL_TABLET | Freq: Every day | ORAL | 0 refills | Status: DC
  Filled 2021-06-20: qty 30, 30d supply, fill #0
  Filled 2021-09-04: qty 30, 30d supply, fill #1

## 2021-06-20 MED ORDER — METFORMIN HCL ER 500 MG PO TB24
1000.0000 mg | ORAL_TABLET | Freq: Every day | ORAL | 0 refills | Status: DC
  Filled 2021-06-20: qty 60, 30d supply, fill #0
  Filled 2021-09-04 – 2021-09-05 (×3): qty 60, 30d supply, fill #1

## 2021-06-20 NOTE — Progress Notes (Signed)
Pt presents for diabetes f/u, pt experiencing insomnia and pain in stomach that radiates to lower back, Pt also states waking up startled and sweating during the night  Needs medication refills

## 2021-06-20 NOTE — Progress Notes (Signed)
Diabetes discussed in office.

## 2021-06-21 ENCOUNTER — Ambulatory Visit: Payer: Self-pay | Admitting: Family

## 2021-06-21 LAB — LIPID PANEL
Chol/HDL Ratio: 4.1 ratio (ref 0.0–5.0)
Cholesterol, Total: 176 mg/dL (ref 100–199)
HDL: 43 mg/dL (ref >39)
LDL Chol Calc (NIH): 63 mg/dL (ref 0–99)
Triglycerides: 454 mg/dL — ABNORMAL HIGH (ref 0–149)
VLDL Cholesterol Cal: 70 mg/dL — ABNORMAL HIGH (ref 5–40)

## 2021-06-21 NOTE — Progress Notes (Signed)
Please call patient with update.   Cholesterol overall improved since 4 months ago. Will continue Atorvastatin at current dose and routinely check cholesterol levels.   The following is for provider reference only: The 10-year ASCVD risk score (Arnett DK, et al., 2019) is: 11.9%   Values used to calculate the score:     Age: 57 years     Sex: Male     Is Non-Hispanic African American: No     Diabetic: Yes     Tobacco smoker: No     Systolic Blood Pressure: 127 mmHg     Is BP treated: Yes     HDL Cholesterol: 43 mg/dL     Total Cholesterol: 176 mg/dL

## 2021-09-05 ENCOUNTER — Other Ambulatory Visit: Payer: Self-pay

## 2021-09-06 NOTE — Progress Notes (Addendum)
? ? ?Patient ID: Derek Cruz, male    DOB: 12/28/1965  MRN: [id number] ? ?CC: Diabetes Follow-Up ? ?Subjective: ?Derek Cruz is a 56 y.o. male who presents for diabetes follow-up.  ? ?His concerns today include:  ?DIABETES TYPE 2 FOLLOW-UP: ?06/20/2021: ?- Hemoglobin A1c today at goal at 6.4%, goal < 7%.  ?- Continue Metformin as prescribed.  ? ?09/11/2021: ?Doing well on current regimen, no issues/concerns.  ? ?2. HYPERTENSION FOLLOW-UP: ?06/20/2021: ?- Continue Lisinopril as prescribed.  ? ?09/11/2021: ?Doing well on current regimen. No side effects. No issues/concerns. Denies chest pain, shortness of breath and additional red flag symptoms. Not checking blood pressures at home.  ? ?3. HYPERLIPIDEMIA FOLLOW-UP: ?Doing well on current Atorvastatin, no issues/concerns.  ? ?4. ACID REFLUX FOLLOW-UP: ?Doing well on current Pantoprazole, no issues/concerns.  ? ?5. INSOMNIA: ?Ongoing for a few weeks. Has tried natural tea. Requesting medication to help.  ? ?Patient Active Problem List  ? Diagnosis Date Noted  ? Elevated liver enzymes   ? Alcohol abuse   ? Liver failure ([place]) 05/07/2020  ? Alcoholic hepatitis   ? Hyponatremia   ? Polycythemia   ? Hemoglobin A1c less than 7.0% 07/16/2019  ? Neck pain 03/31/2019  ? Muscle strain 03/31/2019  ? 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 Gastroesophageal reflux disease without esophagitis 11/24/2018  ? Chest discomfort 11/24/2018  ? Essential hypertension 11/24/2018  ? Pre-diabetes 11/24/2018  ? Hyperlipidemia 10/06/2016  ? Chest pain 09/26/2016  ? Diabetes mellitus type 2, controlled ([place]) 09/26/2016  ?  ? ?Current Outpatient Medications on File Prior to Visit  ?Medication Sig Dispense Refill  ? thiamine 100 MG tablet Take 1 tablet (100 mg total) by mouth daily. 30 tablet 0  ? ?No current facility-administered medications on file prior to visit.  ? ? ?No Known Allergies ? ?Social History  ? ?Socioeconomic History  ? Marital status: Married  ?   Spouse name: Not on file  ? Number of children: Not on file  ? Years of education: Not on file  ? Highest education level: Not on file  ?Occupational History  ? Not on file  ?Tobacco Use  ? Smoking status: Former  ?  Passive exposure: Past  ? Smokeless tobacco: Never  ?Vaping Use  ? Vaping Use: Never used  ?Substance and Sexual Activity  ? Alcohol use: Not Currently  ?  Comment: 6 pack a day, none now 06-07-2020  ? Drug use: No  ? Sexual activity: Not on file  ?Other Topics Concern  ? Not on file  ?Social History Narrative  ? Not on file  ? ?Social Determinants of Health  ? ?Financial Resource Strain: Not on file  ?Food Insecurity: Not on file  ?Transportation Needs: Not on file  ?Physical Activity: Not on file  ?Stress: Not on file  ?Social Connections: Not on file  ?Intimate Partner Violence: Not on file  ? ? ?Family History  ?Problem Relation Age of Onset  ? Diabetes Mellitus II Neg Hx   ? Stomach cancer Neg Hx   ? Colon cancer Neg Hx   ? ? ?No past surgical history on file. ? ?ROS: ?Review of Systems ?Negative except as stated above ? ?PHYSICAL EXAM: ?BP 134/88 (BP Location: Left Arm, Patient Position: Sitting, Cuff Size: Large)   Pulse (!) 106   Temp 98.3 ?F (36.8 ?C)   Resp 18   Ht 5' 5.98" (1.676 m)   Wt 167 lb (75.8 kg)   SpO2 95%  BMI 26.97 kg/m?  ? ?Physical Exam ?HENT:  ?   Head: Normocephalic and atraumatic.  ?Eyes:  ?   Extraocular Movements: Extraocular movements intact.  ?   Conjunctiva/sclera: Conjunctivae normal.  ?   Pupils: Pupils are equal, round, and reactive to light.  ?Cardiovascular:  ?   Rate and Rhythm: Normal rate and regular rhythm.  ?   Pulses: Normal pulses.  ?   Heart sounds: Normal heart sounds.  ?Pulmonary:  ?   Effort: Pulmonary effort is normal.  ?   Breath sounds: Normal breath sounds.  ?Musculoskeletal:  ?   Cervical back: Normal range of motion and neck supple.  ?Neurological:  ?   General: No focal deficit present.  ?   Mental Status: He is alert and oriented to person,  place, and time.  ?Psychiatric:     ?   Mood and Affect: Mood normal.     ?   Behavior: Behavior normal.  ? ?Results for orders placed or performed in visit on 09/11/21  ?POCT glycosylated hemoglobin (Hb A1C)  ?Result Value Ref Range  ? Hemoglobin A1C 5.9 (A) 4.0 - 5.6 %  ? HbA1c POC (<> result, manual entry)    ? HbA1c, POC (prediabetic range)    ? HbA1c, POC (controlled diabetic range)    ? ? ?ASSESSMENT AND PLAN: ?1. Type 2 diabetes mellitus without complication, without long-term current use of insulin ([place]): ?- Hemoglobin A1c today at goal at 5.9%, goal < 7%.  ?- Continue Metformin as prescribed.  ?- Discussed the importance of healthy eating habits, low-carbohydrate diet, low-sugar diet, regular aerobic exercise (at least 150 minutes a week as tolerated) and medication compliance to achieve or maintain control of diabetes. ?- Follow-up with primary provider in 4 months or sooner if needed.  ?- POCT glycosylated hemoglobin (Hb A1C) ?- metFORMIN (GLUCOPHAGE-XR) 500 MG 24 hr tablet; Take 2 tablets (1,000 mg total) by mouth daily with breakfast.  Dispense: 240 tablet; Refill: 0 ? ?2. Essential (primary) hypertension: ?- Continue Lisinopril as prescribed.  ?- Counseled on blood pressure goal of less than 130/80, low-sodium, DASH diet, medication compliance, and 150 minutes of moderate intensity exercise per week as tolerated. Counseled on medication adherence and adverse effects. ?- Follow-up with primary provider in 4 months or sooner if needed.  ?- lisinopril (ZESTRIL) 20 MG tablet; Take 1 tablet (20 mg total) by mouth daily.  Dispense: 120 tablet; Refill: 0 ? ?3. Hyperlipidemia, unspecified hyperlipidemia type: ?- Continue Atorvastatin as prescribed.  ?- Follow-up with primary provider as scheduled.  ?- atorvastatin (LIPITOR) 40 MG tablet; Take 1 tablet (40 mg total) by mouth daily.  Dispense: 90 tablet; Refill: 1 ? ?4. Gastroesophageal reflux disease without esophagitis: ?- Continue Pantoprazole as prescribed.   ?- Follow-up with primary provider as scheduled. ?- pantoprazole (PROTONIX) 40 MG tablet; Take 1 tablet (40 mg total) by mouth daily as needed.  Dispense: 90 tablet; Refill: 1 ? ?5. Insomnia, unspecified type: ?- Trazodone as prescribed. Counseled on medication adherence and adverse effects.  ?- Follow-up with primary provider in 4 weeks or sooner if needed.  ?- traZODone (DESYREL) 50 MG tablet; Take 0.5 tablets (25 mg total) by mouth at bedtime.  Dispense: 15 tablet; Refill: 0 ? ?6. Language barrier: ?- [employer] participated during today's appointment. Interpreter Name: Neill Loft, ID#[id number]. ? ? ?Patient was given the opportunity to ask questions.  Patient verbalized understanding of the plan and was able to repeat key elements of the plan. Patient was given  clear instructions to go to Emergency Department or return to medical center if symptoms don't improve, worsen, or new problems develop.The patient verbalized understanding. ? ? ?Orders Placed This Encounter  ?Procedures  ? POCT glycosylated hemoglobin (Hb A1C)  ? ? ? ?Requested Prescriptions  ? ?Signed Prescriptions Disp Refills  ? metFORMIN (GLUCOPHAGE-XR) 500 MG 24 hr tablet 240 tablet 0  ?  Sig: Take 2 tablets (1,000 mg total) by mouth daily with breakfast.  ? lisinopril (ZESTRIL) 20 MG tablet 120 tablet 0  ?  Sig: Take 1 tablet (20 mg total) by mouth daily.  ? atorvastatin (LIPITOR) 40 MG tablet 90 tablet 1  ?  Sig: Take 1 tablet (40 mg total) by mouth daily.  ? pantoprazole (PROTONIX) 40 MG tablet 90 tablet 1  ?  Sig: Take 1 tablet (40 mg total) by mouth daily as needed.  ? traZODone (DESYREL) 50 MG tablet 15 tablet 0  ?  Sig: Take 0.5 tablets (25 mg total) by mouth at bedtime.  ? ? ?Return in about 4 months (around 01/11/2022) for Follow-Up or next available HTN, DM. ? ?Camillia Herter, NP  ?

## 2021-09-11 ENCOUNTER — Other Ambulatory Visit: Payer: Self-pay

## 2021-09-11 ENCOUNTER — Encounter: Payer: Self-pay | Admitting: Family

## 2021-09-11 ENCOUNTER — Ambulatory Visit (INDEPENDENT_AMBULATORY_CARE_PROVIDER_SITE_OTHER): Payer: Self-pay | Admitting: Family

## 2021-09-11 VITALS — BP 134/88 | HR 106 | Temp 98.3°F | Resp 18 | Ht 65.98 in | Wt 167.0 lb

## 2021-09-11 DIAGNOSIS — E785 Hyperlipidemia, unspecified: Secondary | ICD-10-CM

## 2021-09-11 DIAGNOSIS — E119 Type 2 diabetes mellitus without complications: Secondary | ICD-10-CM

## 2021-09-11 DIAGNOSIS — I10 Essential (primary) hypertension: Secondary | ICD-10-CM

## 2021-09-11 DIAGNOSIS — G4700 Insomnia, unspecified: Secondary | ICD-10-CM

## 2021-09-11 DIAGNOSIS — K219 Gastro-esophageal reflux disease without esophagitis: Secondary | ICD-10-CM

## 2021-09-11 DIAGNOSIS — Z789 Other specified health status: Secondary | ICD-10-CM

## 2021-09-11 LAB — POCT GLYCOSYLATED HEMOGLOBIN (HGB A1C): Hemoglobin A1C: 5.9 % — AB (ref 4.0–5.6)

## 2021-09-11 MED ORDER — ATORVASTATIN CALCIUM 40 MG PO TABS
40.0000 mg | ORAL_TABLET | Freq: Every day | ORAL | 1 refills | Status: DC
  Filled 2021-09-11: qty 90, 90d supply, fill #0

## 2021-09-11 MED ORDER — PANTOPRAZOLE SODIUM 40 MG PO TBEC
40.0000 mg | DELAYED_RELEASE_TABLET | Freq: Every day | ORAL | 1 refills | Status: DC | PRN
  Filled 2021-09-11 – 2021-09-20 (×2): qty 90, 90d supply, fill #0

## 2021-09-11 MED ORDER — TRAZODONE HCL 50 MG PO TABS
25.0000 mg | ORAL_TABLET | Freq: Every day | ORAL | 0 refills | Status: DC
  Filled 2021-09-11 – 2021-09-20 (×2): qty 15, 30d supply, fill #0

## 2021-09-11 MED ORDER — LISINOPRIL 20 MG PO TABS
20.0000 mg | ORAL_TABLET | Freq: Every day | ORAL | 0 refills | Status: DC
  Filled 2021-09-11: qty 120, 120d supply, fill #0
  Filled 2021-11-15: qty 90, 90d supply, fill #0

## 2021-09-11 MED ORDER — METFORMIN HCL ER 500 MG PO TB24
1000.0000 mg | ORAL_TABLET | Freq: Every day | ORAL | 0 refills | Status: DC
  Filled 2021-09-11: qty 240, 120d supply, fill #0

## 2021-09-11 NOTE — Progress Notes (Signed)
Diabetes discussed in office.

## 2021-09-11 NOTE — Progress Notes (Signed)
Pt present for hypertension and diabetes follow-up  ?

## 2021-09-18 ENCOUNTER — Other Ambulatory Visit: Payer: Self-pay

## 2021-09-20 ENCOUNTER — Emergency Department (HOSPITAL_COMMUNITY): Payer: Self-pay

## 2021-09-20 ENCOUNTER — Emergency Department (HOSPITAL_COMMUNITY)
Admission: EM | Admit: 2021-09-20 | Discharge: 2021-09-20 | Disposition: A | Discharge status: Home / Self Care | Payer: Self-pay | Attending: Emergency Medicine | Admitting: Emergency Medicine

## 2021-09-20 ENCOUNTER — Other Ambulatory Visit: Payer: Self-pay

## 2021-09-20 ENCOUNTER — Encounter (HOSPITAL_COMMUNITY): Payer: Self-pay

## 2021-09-20 DIAGNOSIS — R000 Tachycardia, unspecified: Secondary | ICD-10-CM | POA: Insufficient documentation

## 2021-09-20 DIAGNOSIS — R0789 Other chest pain: Secondary | ICD-10-CM | POA: Insufficient documentation

## 2021-09-20 DIAGNOSIS — R0602 Shortness of breath: Secondary | ICD-10-CM | POA: Insufficient documentation

## 2021-09-20 DIAGNOSIS — R7401 Elevation of levels of liver transaminase levels: Secondary | ICD-10-CM | POA: Insufficient documentation

## 2021-09-20 DIAGNOSIS — R1013 Epigastric pain: Secondary | ICD-10-CM | POA: Insufficient documentation

## 2021-09-20 DIAGNOSIS — Z79899 Other long term (current) drug therapy: Secondary | ICD-10-CM | POA: Insufficient documentation

## 2021-09-20 DIAGNOSIS — Z7984 Long term (current) use of oral hypoglycemic drugs: Secondary | ICD-10-CM | POA: Insufficient documentation

## 2021-09-20 DIAGNOSIS — I10 Essential (primary) hypertension: Secondary | ICD-10-CM | POA: Insufficient documentation

## 2021-09-20 DIAGNOSIS — R112 Nausea with vomiting, unspecified: Secondary | ICD-10-CM | POA: Insufficient documentation

## 2021-09-20 DIAGNOSIS — R109 Unspecified abdominal pain: Secondary | ICD-10-CM

## 2021-09-20 DIAGNOSIS — R079 Chest pain, unspecified: Secondary | ICD-10-CM

## 2021-09-20 DIAGNOSIS — E1165 Type 2 diabetes mellitus with hyperglycemia: Secondary | ICD-10-CM | POA: Insufficient documentation

## 2021-09-20 LAB — BASIC METABOLIC PANEL
Anion gap: 15 (ref 5–15)
BUN: 9 mg/dL (ref 6–20)
CO2: 20 mmol/L — ABNORMAL LOW (ref 22–32)
Calcium: 8.9 mg/dL (ref 8.9–10.3)
Chloride: 103 mmol/L (ref 98–111)
Creatinine, Ser: 0.66 mg/dL (ref 0.61–1.24)
GFR, Estimated: >60 mL/min (ref >60)
Glucose, Bld: 130 mg/dL — ABNORMAL HIGH (ref 70–99)
Potassium: 3.6 mmol/L (ref 3.5–5.1)
Sodium: 138 mmol/L (ref 135–145)

## 2021-09-20 LAB — TROPONIN I (HIGH SENSITIVITY)
Troponin I (High Sensitivity): 5 ng/L (ref <18)
Troponin I (High Sensitivity): 4 ng/L (ref <18)

## 2021-09-20 LAB — CBC
HCT: 44.5 % (ref 39.0–52.0)
Hemoglobin: 16.3 g/dL (ref 13.0–17.0)
MCH: 32.4 pg (ref 26.0–34.0)
MCHC: 36.6 g/dL — ABNORMAL HIGH (ref 30.0–36.0)
MCV: 88.5 fL (ref 80.0–100.0)
Platelets: 287 10*3/uL (ref 150–400)
RBC: 5.03 MIL/uL (ref 4.22–5.81)
RDW: 14.4 % (ref 11.5–15.5)
WBC: 7.3 10*3/uL (ref 4.0–10.5)
nRBC: 0 % (ref 0.0–0.2)

## 2021-09-20 LAB — HEPATIC FUNCTION PANEL
ALT: 38 U/L (ref 0–44)
AST: 47 U/L — ABNORMAL HIGH (ref 15–41)
Albumin: 4 g/dL (ref 3.5–5.0)
Alkaline Phosphatase: 72 U/L (ref 38–126)
Bilirubin, Direct: <0.1 mg/dL (ref 0.0–0.2)
Total Bilirubin: 1 mg/dL (ref 0.3–1.2)
Total Protein: 7.5 g/dL (ref 6.5–8.1)

## 2021-09-20 LAB — I-STAT BETA HCG BLOOD, ED (MC, WL, AP ONLY): I-stat hCG, quantitative: <5 m[IU]/mL (ref <5)

## 2021-09-20 LAB — LIPASE, BLOOD: Lipase: 34 U/L (ref 11–51)

## 2021-09-20 MED ORDER — ASPIRIN 325 MG PO TABS
325.0000 mg | ORAL_TABLET | Freq: Once | ORAL | Status: AC
  Administered 2021-09-20: 325 mg via ORAL
  Filled 2021-09-20: qty 1

## 2021-09-20 MED ORDER — CHLORDIAZEPOXIDE HCL 5 MG PO CAPS: 5.0000 mg | ORAL_CAPSULE | Freq: Once | ORAL | Status: DC

## 2021-09-20 MED ORDER — PROCHLORPERAZINE EDISYLATE 10 MG/2ML IJ SOLN
10.0000 mg | Freq: Once | INTRAMUSCULAR | Status: AC
  Administered 2021-09-20: 10 mg via INTRAVENOUS
  Filled 2021-09-20: qty 2

## 2021-09-20 MED ORDER — ALUM & MAG HYDROXIDE-SIMETH 200-200-20 MG/5ML PO SUSP
30.0000 mL | Freq: Once | ORAL | Status: AC
  Administered 2021-09-20: 30 mL via ORAL
  Filled 2021-09-20: qty 30

## 2021-09-20 MED ORDER — SODIUM CHLORIDE 0.9 % IV BOLUS
1000.0000 mL | Freq: Once | INTRAVENOUS | Status: AC
  Administered 2021-09-20: 1000 mL via INTRAVENOUS

## 2021-09-20 MED ORDER — CHLORDIAZEPOXIDE HCL 10 MG PO CAPS
10.0000 mg | ORAL_CAPSULE | Freq: Once | ORAL | Status: AC
  Administered 2021-09-20: 10 mg via ORAL
  Filled 2021-09-20: qty 1

## 2021-09-20 MED ORDER — ONDANSETRON 4 MG PO TBDP
4.0000 mg | ORAL_TABLET | Freq: Once | ORAL | Status: AC
  Administered 2021-09-20: 4 mg via ORAL
  Filled 2021-09-20: qty 1

## 2021-09-20 MED ORDER — ONDANSETRON HCL 4 MG PO TABS
4.0000 mg | ORAL_TABLET | Freq: Four times a day (QID) | ORAL | 0 refills | Status: DC
  Filled 2021-09-20: qty 12, 3d supply, fill #0

## 2021-09-20 MED ORDER — NITROGLYCERIN 0.4 MG SL SUBL
0.4000 mg | SUBLINGUAL_TABLET | SUBLINGUAL | Status: DC | PRN
  Administered 2021-09-20: 0.4 mg via SUBLINGUAL
  Filled 2021-09-20: qty 1

## 2021-09-20 NOTE — ED Provider Triage Note (Signed)
Emergency Medicine Provider Triage Evaluation Note ? ?Derek Cruz , a 56 y.o. male  was evaluated in triage.  Pt complains of chest pain of 1 day duration with associated lightheadedness and tremors.  Has history of diabetes, hyperlipidemia.  Denies prior history of CAD, or MI.  Pain radiates to his neck. ? ?Review of Systems  ?Positive: As above ?Negative: As above ? ?Physical Exam  ?There were no vitals taken for this visit. ?Gen:   Awake, no distress   ?Resp:  Normal effort  ?MSK:   Moves extremities without difficulty  ?Other:  Symmetrical radial pulses bilaterally. ? ?Medical Decision Making  ?Medically screening exam initiated at 4:02 AM.  Appropriate orders placed.  Rube Brett Darko was informed that the remainder of the evaluation will be completed by another provider, this initial triage assessment does not replace that evaluation, and the importance of remaining in the ED until their evaluation is complete. ? ?Aspirin given in triage. ?  Marita [place], PA-C ?09/20/21 0411 ? ?

## 2021-09-20 NOTE — ED Provider Notes (Addendum)
Accepted handoff at shift change from [facility]. Please see prior provider note for more detail.  ? ?Briefly: Patient is 56 y.o.  ? ?DDX: concern for chest pain, SOB, nausea. Hx of HTN, HLD, DM2, alcohol use disorder. Improved with ASA, no improvement with Nitro.  ? ? ?Plan: chest pain is reproducible on exam, negative workup so far. Patient pending delta troponin, received GI cocktail. On reassessment: Patient with now complaints that most of his pain is epigastric, with some nausea and vomiting on reassessment.  I have suspicion for alcohol induced gastritis with patient's history of 6-7 beers per day prior to couple of days ago.  Possibly some symptoms of withdrawal as well.  Patient reports the chest pain is largely improved, his delta troponin was negative.  I have low clinical suspicion for ongoing possibility of atypical ACS, unstable angina.  No evidence of pancreatitis on lab work.  No focal tenderness on exam.  We will trial fluid bolus, Compazine, p.o. challenge and reevaluate. ? ?Plan to D/C on re-eval with librium taper if patient would like to stop drinking. Patient declines librium taper. Discussed likely alcoholic gastritis vs. Other. D/c with zofran. Follow up precautions given. Doubt acute DTs or dangerous withdrawal symptoms at this time, so feel d/c is reasonable. Encouraged to follow up with outpatient substance abuse resources. ? ?RISR ? ?EDTHIS ? ?  ?Olene Floss, PA-C ?09/20/21 4098 ? ?  ?Olene Floss, [place] ?09/20/21 1191 ? ?  ?Gerhard Munch, MD ?09/20/21 1441 ? ?

## 2021-09-20 NOTE — Discharge Instructions (Addendum)
Continue taking your home medicines as they are prescribed. ?Take Tylenol and ibuprofen as needed for pain.   ?Follow-up with your primary care doctor this week or early next week for reevaluation to make sure symptoms have improved.  Return to the ED if your chest pain worsens or new symptoms present. ? ?Contin?e tomando sus medicamentos caseros a medida que se los Microbiologist. ?Tome Tylenol e ibuprofeno seg?n sea necesario para el dolor.   ?Haga un seguimiento con su m?dico de atenci?n primaria esta semana o principios de la pr?xima para una reevaluaci?n para asegurarse de que los s?ntomas hayan mejorado.  Regrese a la sala de emergencias si su dolor en el pecho empeora o si se presentan nuevos s?ntomas. ?

## 2021-09-20 NOTE — ED Triage Notes (Signed)
Pt c/o L sided CP since yesterday. Pt also endorses SOB and nausea.  ?

## 2021-09-20 NOTE — ED Notes (Signed)
Pt given food and taxi voucher.  ?

## 2021-09-20 NOTE — ED Provider Notes (Signed)
?[facility] ?Provider Note ? ? ?CSN: [id number] ?Arrival date & time: 09/20/21  0354 ? ?  ? ?History ? ?Chief Complaint  ?Patient presents with  ? Chest Pain  ? ? ?Derek Cruz is a 56 y.o. male. ? ?The history is limited by a language barrier. A language interpreter was used.  ?Chest Pain ? ?Patient with medical history notable for alcohol use disorder, hypertension, hyperlipidemia, type 2 diabetes presents today due to chest pain.  Chest pain is left-sided, radiates to his jaw and to his back.  Started 3 days ago and was initially intermittent, starting at midnight tonight its been constant.  Feels like a burning pain, associated with some shortness of breath but no nausea or vomiting.  Denies having pain like this previously.  Was given aspirin and nitroglycerin in triage, states the aspirin helped with the nitro made him nauseated.   ? ?Patient does not smoke cigarettes, no family history of CAD before 81, denies any previous MI or PE. ? ?Last alcohol consumption yesterday afternoon, 2 beers ? ?Home Medications ?Prior to Admission medications   ?Medication Sig Start Date End Date Taking? Authorizing Provider  ?atorvastatin (LIPITOR) 40 MG tablet Take 1 tablet (40 mg total) by mouth daily. 09/11/21 03/10/22  Camillia Herter, NP  ?lisinopril (ZESTRIL) 20 MG tablet Take 1 tablet (20 mg total) by mouth daily. 09/11/21 01/09/22  Camillia Herter, NP  ?metFORMIN (GLUCOPHAGE-XR) 500 MG 24 hr tablet Take 2 tablets (1,000 mg total) by mouth daily with breakfast. 09/11/21 01/09/22  Camillia Herter, NP  ?pantoprazole (PROTONIX) 40 MG tablet Take 1 tablet (40 mg total) by mouth daily as needed. 09/11/21 03/10/22  Camillia Herter, NP  ?thiamine 100 MG tablet Take 1 tablet (100 mg total) by mouth daily. 05/10/20   Meccariello, Bernita Raisin, DO  ?traZODone (DESYREL) 50 MG tablet Take 0.5 tablets (25 mg total) by mouth at bedtime. 09/11/21 10/11/21  Camillia Herter, NP  ?   ? ?Allergies     ?Patient has no known allergies.   ? ?Review of Systems   ?Review of Systems  ?Cardiovascular:  Positive for chest pain.  ? ?Physical Exam ?Updated Vital Signs ?BP 129/83   Pulse 93   Temp 98.4 ?F (36.9 ?C) (Oral)   Resp 14   Ht 5\' 6"  (1.676 m)   Wt 75.8 kg   SpO2 91%   BMI 26.95 kg/m?  ?Physical Exam ?Vitals and nursing note reviewed. Exam conducted with a chaperone present.  ?Constitutional:   ?   Appearance: Normal appearance.  ?HENT:  ?   Head: Normocephalic and atraumatic.  ?Eyes:  ?   General: No scleral icterus.    ?   Right eye: No discharge.     ?   Left eye: No discharge.  ?   Extraocular Movements: Extraocular movements intact.  ?   Pupils: Pupils are equal, round, and reactive to light.  ?Cardiovascular:  ?   Rate and Rhythm: Regular rhythm. Tachycardia present.  ?   Pulses: Normal pulses.  ?   Heart sounds: Normal heart sounds. No murmur heard. ?  No friction rub. No gallop.  ?   Comments: Regular rhythm but mild tachycardia ?Pulmonary:  ?   Effort: Pulmonary effort is normal. No respiratory distress.  ?   Breath sounds: Normal breath sounds.  ?Chest:  ?   Chest wall: Tenderness present.  ?   Comments: Left chest wall pinpoint reproducible chest tenderness ?Abdominal:  ?  General: Abdomen is flat. Bowel sounds are normal. There is no distension.  ?   Palpations: Abdomen is soft.  ?   Tenderness: There is no abdominal tenderness.  ?Musculoskeletal:  ?   Right lower leg: No edema.  ?   Left lower leg: No edema.  ?Skin: ?   General: Skin is warm and dry.  ?   Coloration: Skin is not jaundiced.  ?Neurological:  ?   Mental Status: He is alert. Mental status is at baseline.  ?   Coordination: Coordination normal.  ? ? ?ED Results / Procedures / Treatments   ?Labs ?(all labs ordered are listed, but only abnormal results are displayed) ?Labs Reviewed  ?BASIC METABOLIC PANEL - Abnormal; Notable for the following components:  ?    Result Value  ? CO2 20 (*)   ? Glucose, Bld 130 (*)   ? All other  components within normal limits  ?CBC - Abnormal; Notable for the following components:  ? MCHC 36.6 (*)   ? All other components within normal limits  ?HEPATIC FUNCTION PANEL - Abnormal; Notable for the following components:  ? AST 47 (*)   ? All other components within normal limits  ?LIPASE, BLOOD  ?I-STAT BETA HCG BLOOD, ED ([facility], [facility], [facility] ONLY)  ?TROPONIN I (HIGH SENSITIVITY)  ?TROPONIN I (HIGH SENSITIVITY)  ? ? ?EKG ?None ? ?Radiology ?DG Chest 2 View ? ?Result Date: 09/20/2021 ?CLINICAL DATA:  Left chest pain for 1 day EXAM: CHEST - 2 VIEW COMPARISON:  09/26/2016 FINDINGS: Normal heart size and mediastinal contours. No acute infiltrate or edema. No effusion or pneumothorax. No acute osseous findings. IMPRESSION: Negative chest. Electronically Signed   By: Jorje Guild M.D.   On: 09/20/2021 05:20   ? ?Procedures ?Procedures  ? ? ?Medications Ordered in ED ?Medications  ?aspirin tablet 325 mg (325 mg Oral Given 09/20/21 0418)  ?ondansetron (ZOFRAN-ODT) disintegrating tablet 4 mg (4 mg Oral Given 09/20/21 0424)  ?alum & mag hydroxide-simeth (MAALOX/MYLANTA) 200-200-20 MG/5ML suspension 30 mL (30 mLs Oral Given 09/20/21 0541)  ? ? ?ED Course/ Medical Decision Making/ A&P ?  ?                        ?Medical Decision Making ?Amount and/or Complexity of Data Reviewed ?Labs: ordered. ?Radiology: ordered. ? ?Risk ?OTC drugs. ? ? ?This patient presents to the ED for concern of chest pain, this involves an extensive number of treatment options, and is a complaint that carries with it a high risk of complications and morbidity.  The differential diagnosis includes ACS, PE, pneumonia, costochondritis, GERD, esophageal rupture, pancreatitis, gastritis. ? ? ?Additional history obtained:  ? ?I reviewed external records including patient's most recent office visit with his PCP.  Patient has a history of alcohol use disorder, per chart review patient tends to be mildly tachycardic in his office visits.. ? ?Patient reportedly has a  history of alcoholic hepatitis.  He has been followed by [facility] in the past. ? ?Last cardiac stress test in May 2018 without any ischemic findings. ?  ?Lab Tests: ? ?I ordered, viewed, and personally interpreted labs.  The pertinent results include:  ?-CBC without leukocytosis or anemia. ?-BMP without gross electrolyte derangement or AKI.  Slight hyperglycemia 130. ?-Hepatic panel with AST slightly elevated at 47. ?- Troponin 5 initially.  Second troponin pending. ? ?  ?Imaging Studies ordered: ? ?I directly visualized th so we justify e chest x-ray which showed no acute  process ? ?I agree with the radiologist interpretation ?  ? ?ECG/Cardiac monitoring:  ? ?Per my interpretation, EKG shows sinus rhythm ? ?The patient was maintained on a cardiac monitor.  Visualized monitor strip which showed sinus rhythm with a heart rate of 99 per my interpretation.  ? ? ?Medicines ordered and prescription drug management: ? ?I ordered medication including: Aspirin, nitroglycerin, Maalox ? ?I have reviewed the patients home medicines and have made adjustments as needed ? ? ?Test Considered: ? ?Patient's heart score is 3. ? ?  ?Reevaluation: ? ?After the interventions noted above, I reevaluated the patient and found pain is improved. ? ? ?Problems addressed / ED Course: ?Patient presents with chest pain.  Heart score 3.  Improved with Maalox and aspirin, nitroglycerin may have improved it somewhat but patient mostly is concerned about the side effects.  Given chest wall tenderness is focal, pinpoint and reproducible I think this is most likely costochondritis.  Given risk factors will get second troponin, anticipate likely discharge home with PCP follow-up. ?  ?Social Determinants of Health: ?Has a primary care provider ? ?Care signed out to [facility] PA-C at shift change.  ? ? ? ? ? ? ?Final Clinical Impression(s) / ED Diagnoses ?Final diagnoses:  ?Nonspecific chest pain  ? ? ?Rx / DC Orders ?ED Discharge Orders   ? ?  None  ? ?  ? ? ?  ?Sherrill Raring, PA-C ?09/20/21 [id number] ? ?  ?Maudie Flakes, MD ?09/20/21 [phone] ? ?

## 2021-11-16 ENCOUNTER — Other Ambulatory Visit: Payer: Self-pay

## 2022-01-01 NOTE — Progress Notes (Deleted)
Patient ID: Derek Cruz, male    DOB: January 05, 1966  MRN: [id number]  CC: Chronic Care Management   Subjective: Derek Cruz is a 56 y.o. male who presents for chronic care management.   His concerns today include:  DM -  Continue Metformin  HTN - Continue Lisinopril  HLD - Continue Atorvastatin   Patient Active Problem List   Diagnosis Date Noted   Elevated liver enzymes    Alcohol abuse    Liver failure (HCC) 05/07/2020   Alcoholic hepatitis    Hyponatremia    Polycythemia    Hemoglobin A1c less than 7.0% 07/16/2019   Neck pain 03/31/2019   Muscle strain 03/31/2019   Undifferentiated abdominal pain 03/31/2019   Gastroesophageal reflux disease without esophagitis 11/24/2018   Chest discomfort 11/24/2018   Essential hypertension 11/24/2018   Pre-diabetes 11/24/2018   Hyperlipidemia 10/06/2016   Chest pain 09/26/2016   Diabetes mellitus type 2, controlled (HCC) 09/26/2016     Current Outpatient Medications on File Prior to Visit  Medication Sig Dispense Refill   atorvastatin (LIPITOR) 40 MG tablet Take 1 tablet (40 mg total) by mouth daily. 90 tablet 1   lisinopril (ZESTRIL) 20 MG tablet Take 1 tablet (20 mg total) by mouth daily. 120 tablet 0   metFORMIN (GLUCOPHAGE-XR) 500 MG 24 hr tablet Take 2 tablets (1,000 mg total) by mouth daily with breakfast. 240 tablet 0   ondansetron (ZOFRAN) 4 MG tablet Take 1 tablet (4 mg total) by mouth every 6 (six) hours. 12 tablet 0   pantoprazole (PROTONIX) 40 MG tablet Take 1 tablet (40 mg total) by mouth daily as needed. (Patient taking differently: Take 40 mg by mouth daily as needed (heartburn).) 90 tablet 1   thiamine 100 MG tablet Take 1 tablet (100 mg total) by mouth daily. (Patient not taking: Reported on 09/20/2021) 30 tablet 0   traZODone (DESYREL) 50 MG tablet Take 0.5 tablets (25 mg total) by mouth at bedtime. (Patient not taking: Reported on 09/20/2021) 15 tablet 0   No current facility-administered  medications on file prior to visit.    No Known Allergies  Social History   Socioeconomic History   Marital status: Married    Spouse name: Not on file   Number of children: Not on file   Years of education: Not on file   Highest education level: Not on file  Occupational History   Not on file  Tobacco Use   Smoking status: Former    Passive exposure: Past   Smokeless tobacco: Never  Vaping Use   Vaping Use: Never used  Substance and Sexual Activity   Alcohol use: Not Currently    Comment: 6 pack a day, none now 06-07-2020   Drug use: No   Sexual activity: Not on file  Other Topics Concern   Not on file  Social History Narrative   Not on file   Social Determinants of Health   Financial Resource Strain: Not on file  Food Insecurity: Not on file  Transportation Needs: Not on file  Physical Activity: Not on file  Stress: Not on file  Social Connections: Not on file  Intimate Partner Violence: Not on file    Family History  Problem Relation Age of Onset   Diabetes Mellitus II Neg Hx    Stomach cancer Neg Hx    Colon cancer Neg Hx     No past surgical history on file.  ROS: Review of Systems Negative except as stated above  PHYSICAL EXAM: There were no vitals taken for this visit.  Physical Exam  {male adult master:310786} {male adult master:310785}     Latest Ref Rng & Units 09/20/2021    4:17 AM 02/20/2021   11:28 AM 10/31/2020    1:49 PM  CMP  Glucose 70 - 99 mg/dL 130  96    BUN 6 - 20 mg/dL 9  8    Creatinine 0.61 - 1.24 mg/dL 0.66  0.66    Sodium 135 - 145 mmol/L 138  142    Potassium 3.5 - 5.1 mmol/L 3.6  4.4    Chloride 98 - 111 mmol/L 103  105    CO2 22 - 32 mmol/L 20  20    Calcium 8.9 - 10.3 mg/dL 8.9  9.6    Total Protein 6.5 - 8.1 g/dL 7.5  6.9  7.2   Total Bilirubin 0.3 - 1.2 mg/dL 1.0  0.3  0.4   Alkaline Phos 38 - 126 U/L 72  91  86   AST 15 - 41 U/L 47  14  12   ALT 0 - 44 U/L 38  15  14    Lipid Panel     Component Value  Date/Time   CHOL 176 06/20/2021 1145   TRIG 454 (H) 06/20/2021 1145   HDL 43 06/20/2021 1145   CHOLHDL 4.1 06/20/2021 1145   CHOLHDL 6.5 (H) 01/29/2017 0912   VLDL 44 (H) 10/02/2016 1141   LDLCALC 63 06/20/2021 1145   LDLCALC 143 (H) 01/29/2017 0912    CBC    Component Value Date/Time   WBC 7.3 09/20/2021 0417   RBC 5.03 09/20/2021 0417   HGB 16.3 09/20/2021 0417   HGB 15.6 02/20/2021 1128   HCT 44.5 09/20/2021 0417   HCT 46.6 02/20/2021 1128   PLT 287 09/20/2021 0417   PLT 287 02/20/2021 1128   MCV 88.5 09/20/2021 0417   MCV 90 02/20/2021 1128   MCH 32.4 09/20/2021 0417   MCHC 36.6 (H) 09/20/2021 0417   RDW 14.4 09/20/2021 0417   RDW 13.9 02/20/2021 1128   LYMPHSABS 1.6 11/24/2018 1550   MONOABS 0.5 05/20/2018 1316   EOSABS 0.0 11/24/2018 1550   BASOSABS 0.0 11/24/2018 1550    ASSESSMENT AND PLAN:  There are no diagnoses linked to this encounter.   Patient was given the opportunity to ask questions.  Patient verbalized understanding of the plan and was able to repeat key elements of the plan. Patient was given clear instructions to go to Emergency Department or return to medical center if symptoms don't improve, worsen, or new problems develop.The patient verbalized understanding.   No orders of the defined types were placed in this encounter.    Requested Prescriptions    No prescriptions requested or ordered in this encounter    No follow-ups on file.  Shevonne Wolf J Anderson [place], NP  

## 2022-01-12 ENCOUNTER — Ambulatory Visit: Payer: Self-pay | Admitting: Family

## 2022-01-12 DIAGNOSIS — E119 Type 2 diabetes mellitus without complications: Secondary | ICD-10-CM

## 2022-01-12 DIAGNOSIS — I10 Essential (primary) hypertension: Secondary | ICD-10-CM

## 2022-01-12 DIAGNOSIS — E785 Hyperlipidemia, unspecified: Secondary | ICD-10-CM

## 2022-01-12 DIAGNOSIS — Z789 Other specified health status: Secondary | ICD-10-CM

## 2022-01-12 NOTE — Progress Notes (Unsigned)
Patient ID: Derek Cruz, male    DOB: January 05, 1966  MRN: [id number]  CC: Chronic Care Management   Subjective: Derek Cruz is a 56 y.o. male who presents for chronic care management.   His concerns today include:  DM -  Continue Metformin  HTN - Continue Lisinopril  HLD - Continue Atorvastatin   Patient Active Problem List   Diagnosis Date Noted   Elevated liver enzymes    Alcohol abuse    Liver failure (HCC) 05/07/2020   Alcoholic hepatitis    Hyponatremia    Polycythemia    Hemoglobin A1c less than 7.0% 07/16/2019   Neck pain 03/31/2019   Muscle strain 03/31/2019   Undifferentiated abdominal pain 03/31/2019   Gastroesophageal reflux disease without esophagitis 11/24/2018   Chest discomfort 11/24/2018   Essential hypertension 11/24/2018   Pre-diabetes 11/24/2018   Hyperlipidemia 10/06/2016   Chest pain 09/26/2016   Diabetes mellitus type 2, controlled (HCC) 09/26/2016     Current Outpatient Medications on File Prior to Visit  Medication Sig Dispense Refill   atorvastatin (LIPITOR) 40 MG tablet Take 1 tablet (40 mg total) by mouth daily. 90 tablet 1   lisinopril (ZESTRIL) 20 MG tablet Take 1 tablet (20 mg total) by mouth daily. 120 tablet 0   metFORMIN (GLUCOPHAGE-XR) 500 MG 24 hr tablet Take 2 tablets (1,000 mg total) by mouth daily with breakfast. 240 tablet 0   ondansetron (ZOFRAN) 4 MG tablet Take 1 tablet (4 mg total) by mouth every 6 (six) hours. 12 tablet 0   pantoprazole (PROTONIX) 40 MG tablet Take 1 tablet (40 mg total) by mouth daily as needed. (Patient taking differently: Take 40 mg by mouth daily as needed (heartburn).) 90 tablet 1   thiamine 100 MG tablet Take 1 tablet (100 mg total) by mouth daily. (Patient not taking: Reported on 09/20/2021) 30 tablet 0   traZODone (DESYREL) 50 MG tablet Take 0.5 tablets (25 mg total) by mouth at bedtime. (Patient not taking: Reported on 09/20/2021) 15 tablet 0   No current facility-administered  medications on file prior to visit.    No Known Allergies  Social History   Socioeconomic History   Marital status: Married    Spouse name: Not on file   Number of children: Not on file   Years of education: Not on file   Highest education level: Not on file  Occupational History   Not on file  Tobacco Use   Smoking status: Former    Passive exposure: Past   Smokeless tobacco: Never  Vaping Use   Vaping Use: Never used  Substance and Sexual Activity   Alcohol use: Not Currently    Comment: 6 pack a day, none now 06-07-2020   Drug use: No   Sexual activity: Not on file  Other Topics Concern   Not on file  Social History Narrative   Not on file   Social Determinants of Health   Financial Resource Strain: Not on file  Food Insecurity: Not on file  Transportation Needs: Not on file  Physical Activity: Not on file  Stress: Not on file  Social Connections: Not on file  Intimate Partner Violence: Not on file    Family History  Problem Relation Age of Onset   Diabetes Mellitus II Neg Hx    Stomach cancer Neg Hx    Colon cancer Neg Hx     No past surgical history on file.  ROS: Review of Systems Negative except as stated above  PHYSICAL EXAM: There were no vitals taken for this visit.  Physical Exam  {male adult master:310786} {male adult master:310785}     Latest Ref Rng & Units 09/20/2021    4:17 AM 02/20/2021   11:28 AM 10/31/2020    1:49 PM  CMP  Glucose 70 - 99 mg/dL 751  96    BUN 6 - 20 mg/dL 9  8    Creatinine 0.25 - 1.24 mg/dL 8.52  7.78    Sodium 242 - 145 mmol/L 138  142    Potassium 3.5 - 5.1 mmol/L 3.6  4.4    Chloride 98 - 111 mmol/L 103  105    CO2 22 - 32 mmol/L 20  20    Calcium 8.9 - 10.3 mg/dL 8.9  9.6    Total Protein 6.5 - 8.1 g/dL 7.5  6.9  7.2   Total Bilirubin 0.3 - 1.2 mg/dL 1.0  0.3  0.4   Alkaline Phos 38 - 126 U/L 72  91  86   AST 15 - 41 U/L 47  14  12   ALT 0 - 44 U/L 38  15  14    Lipid Panel     Component Value  Date/Time   CHOL 176 06/20/2021 1145   TRIG 454 (H) 06/20/2021 1145   HDL 43 06/20/2021 1145   CHOLHDL 4.1 06/20/2021 1145   CHOLHDL 6.5 (H) 01/29/2017 0912   VLDL 44 (H) 10/02/2016 1141   LDLCALC 63 06/20/2021 1145   LDLCALC 143 (H) 01/29/2017 0912    CBC    Component Value Date/Time   WBC 7.3 09/20/2021 0417   RBC 5.03 09/20/2021 0417   HGB 16.3 09/20/2021 0417   HGB 15.6 02/20/2021 1128   HCT 44.5 09/20/2021 0417   HCT 46.6 02/20/2021 1128   PLT 287 09/20/2021 0417   PLT 287 02/20/2021 1128   MCV 88.5 09/20/2021 0417   MCV 90 02/20/2021 1128   MCH 32.4 09/20/2021 0417   MCHC 36.6 (H) 09/20/2021 0417   RDW 14.4 09/20/2021 0417   RDW 13.9 02/20/2021 1128   LYMPHSABS 1.6 11/24/2018 1550   MONOABS 0.5 05/20/2018 1316   EOSABS 0.0 11/24/2018 1550   BASOSABS 0.0 11/24/2018 1550    ASSESSMENT AND PLAN:  There are no diagnoses linked to this encounter.   Patient was given the opportunity to ask questions.  Patient verbalized understanding of the plan and was able to repeat key elements of the plan. Patient was given clear instructions to go to Emergency Department or return to medical center if symptoms don't improve, worsen, or new problems develop.The patient verbalized understanding.   No orders of the defined types were placed in this encounter.    Requested Prescriptions    No prescriptions requested or ordered in this encounter    No follow-ups on file.  Rema Fendt, NP

## 2022-01-15 ENCOUNTER — Ambulatory Visit (INDEPENDENT_AMBULATORY_CARE_PROVIDER_SITE_OTHER): Payer: Self-pay | Admitting: Family

## 2022-01-15 ENCOUNTER — Other Ambulatory Visit: Payer: Self-pay

## 2022-01-15 VITALS — BP 106/71 | HR 66 | Temp 98.3°F | Resp 16 | Ht 65.98 in | Wt 168.0 lb

## 2022-01-15 DIAGNOSIS — Z789 Other specified health status: Secondary | ICD-10-CM

## 2022-01-15 DIAGNOSIS — I10 Essential (primary) hypertension: Secondary | ICD-10-CM

## 2022-01-15 DIAGNOSIS — R1310 Dysphagia, unspecified: Secondary | ICD-10-CM

## 2022-01-15 DIAGNOSIS — E785 Hyperlipidemia, unspecified: Secondary | ICD-10-CM

## 2022-01-15 DIAGNOSIS — E119 Type 2 diabetes mellitus without complications: Secondary | ICD-10-CM

## 2022-01-15 DIAGNOSIS — Z1211 Encounter for screening for malignant neoplasm of colon: Secondary | ICD-10-CM

## 2022-01-15 LAB — POCT GLYCOSYLATED HEMOGLOBIN (HGB A1C): Hemoglobin A1C: 5.8 % — AB (ref 4.0–5.6)

## 2022-01-15 MED ORDER — LISINOPRIL 20 MG PO TABS
20.0000 mg | ORAL_TABLET | Freq: Every day | ORAL | 2 refills | Status: DC
  Filled 2022-01-15: qty 90, 90d supply, fill #0

## 2022-01-15 MED ORDER — ATORVASTATIN CALCIUM 40 MG PO TABS
40.0000 mg | ORAL_TABLET | Freq: Every day | ORAL | 1 refills | Status: DC
  Filled 2022-01-15: qty 90, 90d supply, fill #0

## 2022-01-15 MED ORDER — METFORMIN HCL ER 500 MG PO TB24
1000.0000 mg | ORAL_TABLET | Freq: Every day | ORAL | 2 refills | Status: DC
  Filled 2022-01-15: qty 180, 90d supply, fill #0

## 2022-01-15 NOTE — Patient Instructions (Signed)
Colesterol elevado High Cholesterol  El colesterol elevado es una afeccin que se caracteriza porque la sangre tiene niveles altos de una sustancia blanca, cerosa y parecida a la grasa (colesterol). El hgado fabrica todo el colesterol que el organismo necesita. El organismo humano necesita pequeas cantidades de colesterol para formar las clulas. El exceso de colesterol se obtiene de los alimentos que se consumen. La sangre transporta el colesterol desde el hgado al resto del cuerpo. Si tiene el colesterol elevado, pueden acumularse depsitos (placas) en las paredes de las arterias. Las arterias son los vasos sanguneos que transportan la sangre desde el corazn al resto del cuerpo. Las placas causan que las arterias se estrechen y se endurezcan. Las placas de colesterol aumentan el riesgo de sufrir un infarto de miocardio y un accidente cerebrovascular. Trabaje con el mdico para mantener las concentraciones de colesterol en un rango saludable. Qu incrementa el riesgo? Los siguientes factores pueden hacer que sea ms propenso a desarrollar esta afeccin: Consumir alimentos con alto contenido de grasa animal (grasa saturada) o colesterol. Tener sobrepeso. No hacer suficiente ejercicio fsico. Tener antecedentes familiares de colesterol alto (hipercolesterolemia familiar). Consumir productos con tabaco. Tener diabetes. Cules son los signos o sntomas? En la mayora de los casos, el colesterol alto no causa ningn sntoma por lo general. En los casos graves, los niveles muy altos de colesterol pueden causar: Protuberancias de grasa debajo de la piel (xantomas). Un anillo blanco o gris alrededor del centro negro (pupila) del ojo. Cmo se diagnostica? Esta afeccin se puede diagnosticar en funcin de los resultados de un anlisis de sangre. Si es mayor de 20 aos, es posible que el mdico le controle el nivel de colesterol cada 4 a 6 aos. Los controles pueden ser ms frecuentes si tiene el  colesterol elevado u otros factores de riesgo de enfermedad cardaca. En el anlisis de sangre de colesterol, se determina lo siguiente: El colesterol "malo", o colesterol LDL. Este es el principal tipo de colesterol que causa enfermedades cardacas. El nivel recomendado es de menos de 100 mg/dl (2.59 mmol/l). El colesterol "bueno", o colesterol HDL. El HDL ayuda a proteger contra la enfermedad cardaca porque limpia las arterias y arrastra el LDL al hgado para que lo procese. El nivel recomendado de HDL es de 60 mg/dl (1.55 mmol/l) o ms. Triglicridos. Estos son grasas que el organismo puede almacenar o quemar como fuente de energa. El nivel recomendado es de menos de 150 mg/dl (1.69 mmol/l). Colesterol total. Mide la cantidad total de colesterol en la sangre, e incluye el colesterol LDL, el colesterol HDL y los triglicridos. El nivel recomendado es de menos de 200 mg/dl (5.17 mmol/l). Cmo se trata? El tratamiento para el colesterol alto comienza con cambios en el estilo de vida, tales como dieta y ejercicio. Cambios en la dieta. Es posible que le indiquen que consuma alimentos con ms fibra y menos grasas saturadas o azcar agregada. Cambios en el estilo de vida. Estos pueden incluir hacer actividad fsica con regularidad, mantener un peso saludable y dejar de consumir productos con tabaco. Medicamentos. Estos se administran cuando los cambios en la dieta y en el estilo de vida no han sido eficaces. Es posible que le receten medicamentos llamados estatinas para bajar sus niveles de colesterol. Siga estas instrucciones en su casa: Comida y bebida  Siga una dieta saludable y equilibrada. Esta dieta incluye lo siguiente: Porciones diarias de frutas y verduras frescas, congeladas o enlatadas. Porciones diarias de alimentos integrales con alto contenido de fibra. Alimentos   con bajo contenido de grasas saturadas y grasas trans. Estos incluyen carne de ave y pescado sin piel, cortes de carne magros  y productos lcteos descremados. Una variedad de pescado, especialmente pescado graso que contenga cidos grasos omega 3. Propngase comer pescado al menos dos veces por semana. Evite los alimentos y las bebidas que tengan azcar agregada. Use mtodos de coccin saludables, como asar, grillar, hervir, hornear, escalfar, cocer al vapor y saltear. No fra los alimentos excepto para saltearlos. Si bebe alcohol: Limite la cantidad que bebe a lo siguiente: De 0 a 1 medida por da para las mujeres que no estn embarazadas. De 0 a 2 medidas por da para los hombres. Sepa cunta cantidad de alcohol hay en las bebidas. En los [place], una medida equivale a una botella de cerveza de 12 oz (355 ml), un vaso de vino de 5 oz (148 ml) o un vaso de una bebida alcohlica de alta graduacin de 1 oz (44 ml). Estilo de vida  Haga ejercicio con regularidad. Trate de hacer un total de 150 minutos de actividad fsica por semana. Aumente la cantidad de ejercicio fsico que realiza mediante actividades como la jardinera, salir a caminar o usar las escaleras. No consuma ningn producto que contenga nicotina o tabaco. Estos productos incluyen cigarrillos, tabaco para mascar y aparatos de vapeo, como los cigarrillos electrnicos. Si necesita ayuda para dejar de consumir estos productos, consulte al mdico. Indicaciones generales Use los medicamentos de venta libre y los recetados solamente como se lo haya indicado el mdico. Concurra a todas las visitas de seguimiento. Esto es importante. Dnde buscar ms informacin American Heart Association (Asociacin Estadounidense del Corazn): www.heart.org National Heart, Lung, and Blood Institute (Instituto Nacional del Coraz[place], los Pulmones y la Sangre): www.nhlbi.nih.gov Comunquese con un mdico si: Tiene dificultad para alcanzar o mantener una alimentacin sana y un peso saludable. Est por comenzar un programa de ejercicios. No puede dejar de fumar. Solicite ayuda  de inmediato si: Siente dolor en el pecho. Tiene dificultad para respirar. Tiene molestias o dolor en la mandbula, el cuello, la espalda, los hombros o los brazos. Tiene sntomas de un accidente cerebrovascular. "BE FAST" es una manera fcil de recordar los principales signos de advertencia de un accidente cerebrovascular: B: Balance (equilibrio). Los signos son mareos, dificultad repentina para caminar o prdida del equilibrio. E - Eyes (ojos). Los signos son problemas para ver o un cambio repentino en la visin. F: Face (rostro). Los signos son debilidad repentina o entumecimiento del rostro, o el rostro o el prpado que se caen hacia un lado. A: Arms (brazos). Los signos son debilidad o entumecimiento en un brazo. Esto sucede de repente y generalmente en un lado del cuerpo. S: Speech (habla). Los signos son dificultad para hablar, hablar arrastrando las palabras o dificultad para comprender lo que las personas dicen. T: Time (tiempo). Es tiempo de llamar al servicio de emergencias. Anote la hora a la que comenzaron los sntomas. Presenta otros signos de un accidente cerebrovascular, como los siguientes: Dolor de cabeza sbito e intenso que no tiene causa aparente. Nuseas o vmitos. Convulsiones. Estos sntomas pueden representar un problema grave que constituye una emergencia. No espere a ver si los sntomas desaparecen. Solicite atencin mdica de inmediato. Comunquese con el servicio de emergencias de su localidad (911 en los [place]). No conduzca por sus propios medios hasta el hospital. Resumen Las placas de colesterol aumentan el riesgo de sufrir un infarto de miocardio y un accidente cerebrovascular. Trabaje con el mdico   para mantener las concentraciones de colesterol en un rango saludable. Siga una dieta saludable y equilibrada, haga ejercicio con regularidad y mantenga un peso saludable. No consuma ningn producto que contenga nicotina o tabaco. Estos productos incluyen  cigarrillos, tabaco para mascar y aparatos de vapeo, como los cigarrillos electrnicos. Obtenga ayuda de inmediato si tiene cualquier sntoma de un accidente cerebrovascular. Esta informacin no tiene como fin reemplazar el consejo del mdico. Asegrese de hacerle al mdico cualquier pregunta que tenga. Document Revised: 07/25/2020 Document Reviewed: 07/25/2020 Elsevier Patient Education  2023 Elsevier Inc.  

## 2022-01-15 NOTE — Progress Notes (Signed)
.  Pt presents for chronic care management   5.8% A1c 01/15/22

## 2022-04-10 NOTE — Progress Notes (Deleted)
Patient ID: Derek Cruz, male    DOB: [birth year]  MRN: [id number]  CC: Chronic Care Management   Subjective: Derek Cruz is a 56 y.o. male who presents for chronic care management.   His concerns today include:  HTN - Lisinopril   DM - Metformin   HLD - Atorvastatin  Patient Active Problem List   Diagnosis Date Noted   Elevated liver enzymes    Alcohol abuse    Liver failure (HCC) 05/07/2020   Alcoholic hepatitis    Hyponatremia    Polycythemia    Hemoglobin A1c less than 7.0% 07/16/2019   Neck pain 03/31/2019   Muscle strain 03/31/2019   Undifferentiated abdominal pain 03/31/2019   Gastroesophageal reflux disease without esophagitis 11/24/2018   Chest discomfort 11/24/2018   Essential hypertension 11/24/2018   Pre-diabetes 11/24/2018   Hyperlipidemia 10/06/2016   Chest pain 09/26/2016   Diabetes mellitus type 2, controlled (HCC) 09/26/2016     Current Outpatient Medications on File Prior to Visit  Medication Sig Dispense Refill   atorvastatin (LIPITOR) 40 MG tablet Take 1 tablet (40 mg total) by mouth daily. 90 tablet 1   lisinopril (ZESTRIL) 20 MG tablet Take 1 tablet (20 mg total) by mouth daily. 30 tablet 2   metFORMIN (GLUCOPHAGE-XR) 500 MG 24 hr tablet Take 2 tablets (1,000 mg total) by mouth daily with breakfast. 60 tablet 2   ondansetron (ZOFRAN) 4 MG tablet Take 1 tablet (4 mg total) by mouth every 6 (six) hours. 12 tablet 0   pantoprazole (PROTONIX) 40 MG tablet Take 1 tablet (40 mg total) by mouth daily as needed. (Patient taking differently: Take 40 mg by mouth daily as needed (heartburn).) 90 tablet 1   thiamine 100 MG tablet Take 1 tablet (100 mg total) by mouth daily. (Patient not taking: Reported on 09/20/2021) 30 tablet 0   traZODone (DESYREL) 50 MG tablet Take 0.5 tablets (25 mg total) by mouth at bedtime. (Patient not taking: Reported on 09/20/2021) 15 tablet 0   No current facility-administered medications on file prior  to visit.    No Known Allergies  Social History   Socioeconomic History   Marital status: Married    Spouse name: Not on file   Number of children: Not on file   Years of education: Not on file   Highest education level: Not on file  Occupational History   Not on file  Tobacco Use   Smoking status: Former    Passive exposure: Past   Smokeless tobacco: Never  Vaping Use   Vaping Use: Never used  Substance and Sexual Activity   Alcohol use: Not Currently    Comment: 6 pack a day, none now 06-07-2020   Drug use: No   Sexual activity: Not on file  Other Topics Concern   Not on file  Social History Narrative   Not on file   Social Determinants of Health   Financial Resource Strain: Not on file  Food Insecurity: Not on file  Transportation Needs: Not on file  Physical Activity: Not on file  Stress: Not on file  Social Connections: Not on file  Intimate Partner Violence: Not on file    Family History  Problem Relation Age of Onset   Diabetes Mellitus II Neg Hx    Stomach cancer Neg Hx    Colon cancer Neg Hx     No past surgical history on file.  ROS: Review of Systems Negative except as stated above  PHYSICAL EXAM:  There were no vitals taken for this visit.  Physical Exam  {male adult master:310786} {male adult master:310785}     Latest Ref Rng & Units 09/20/2021    4:17 AM 02/20/2021   11:28 AM 10/31/2020    1:49 PM  CMP  Glucose 70 - 99 mg/dL 741  96    BUN 6 - 20 mg/dL 9  8    Creatinine 2.87 - 1.24 mg/dL 8.67  6.72    Sodium 094 - 145 mmol/L 138  142    Potassium 3.5 - 5.1 mmol/L 3.6  4.4    Chloride 98 - 111 mmol/L 103  105    CO2 22 - 32 mmol/L 20  20    Calcium 8.9 - 10.3 mg/dL 8.9  9.6    Total Protein 6.5 - 8.1 g/dL 7.5  6.9  7.2   Total Bilirubin 0.3 - 1.2 mg/dL 1.0  0.3  0.4   Alkaline Phos 38 - 126 U/L 72  91  86   AST 15 - 41 U/L 47  14  12   ALT 0 - 44 U/L 38  15  14    Lipid Panel     Component Value Date/Time   CHOL 176  06/20/2021 1145   TRIG 454 (H) 06/20/2021 1145   HDL 43 06/20/2021 1145   CHOLHDL 4.1 06/20/2021 1145   CHOLHDL 6.5 (H) 01/29/2017 0912   VLDL 44 (H) 10/02/2016 1141   LDLCALC 63 06/20/2021 1145   LDLCALC 143 (H) 01/29/2017 0912    CBC    Component Value Date/Time   WBC 7.3 09/20/2021 0417   RBC 5.03 09/20/2021 0417   HGB 16.3 09/20/2021 0417   HGB 15.6 02/20/2021 1128   HCT 44.5 09/20/2021 0417   HCT 46.6 02/20/2021 1128   PLT 287 09/20/2021 0417   PLT 287 02/20/2021 1128   MCV 88.5 09/20/2021 0417   MCV 90 02/20/2021 1128   MCH 32.4 09/20/2021 0417   MCHC 36.6 (H) 09/20/2021 0417   RDW 14.4 09/20/2021 0417   RDW 13.9 02/20/2021 1128   LYMPHSABS 1.6 11/24/2018 1550   MONOABS 0.5 05/20/2018 1316   EOSABS 0.0 11/24/2018 1550   BASOSABS 0.0 11/24/2018 1550    ASSESSMENT AND PLAN:  There are no diagnoses linked to this encounter.   Patient was given the opportunity to ask questions.  Patient verbalized understanding of the plan and was able to repeat key elements of the plan. Patient was given clear instructions to go to Emergency Department or return to medical center if symptoms don't improve, worsen, or new problems develop.The patient verbalized understanding.   No orders of the defined types were placed in this encounter.    Requested Prescriptions    No prescriptions requested or ordered in this encounter    No follow-ups on file.  Rema Fendt, NP

## 2022-04-17 ENCOUNTER — Ambulatory Visit: Payer: Self-pay | Admitting: Family

## 2022-04-17 DIAGNOSIS — E119 Type 2 diabetes mellitus without complications: Secondary | ICD-10-CM

## 2022-04-17 DIAGNOSIS — E785 Hyperlipidemia, unspecified: Secondary | ICD-10-CM

## 2022-04-17 DIAGNOSIS — Z789 Other specified health status: Secondary | ICD-10-CM

## 2022-04-17 DIAGNOSIS — I10 Essential (primary) hypertension: Secondary | ICD-10-CM

## 2022-04-20 NOTE — Progress Notes (Deleted)
Patient ID: Derek Cruz, male    DOB: 07-11-1965  MRN: [id number]  CC: Chronic Care Management   Subjective: Derek Cruz is a 56 y.o. male who presents for chronic care management.   His concerns today include:  HTN - Lisinopril   DM - Metformin   HLD - Atorvastatin  Patient Active Problem List   Diagnosis Date Noted   Elevated liver enzymes    Alcohol abuse    Liver failure (HCC) 05/07/2020   Alcoholic hepatitis    Hyponatremia    Polycythemia    Hemoglobin A1c less than 7.0% 07/16/2019   Neck pain 03/31/2019   Muscle strain 03/31/2019   Undifferentiated abdominal pain 03/31/2019   Gastroesophageal reflux disease without esophagitis 11/24/2018   Chest discomfort 11/24/2018   Essential hypertension 11/24/2018   Pre-diabetes 11/24/2018   Hyperlipidemia 10/06/2016   Chest pain 09/26/2016   Diabetes mellitus type 2, controlled (HCC) 09/26/2016     Current Outpatient Medications on File Prior to Visit  Medication Sig Dispense Refill   atorvastatin (LIPITOR) 40 MG tablet Take 1 tablet (40 mg total) by mouth daily. 90 tablet 1   lisinopril (ZESTRIL) 20 MG tablet Take 1 tablet (20 mg total) by mouth daily. 30 tablet 2   metFORMIN (GLUCOPHAGE-XR) 500 MG 24 hr tablet Take 2 tablets (1,000 mg total) by mouth daily with breakfast. 60 tablet 2   ondansetron (ZOFRAN) 4 MG tablet Take 1 tablet (4 mg total) by mouth every 6 (six) hours. 12 tablet 0   pantoprazole (PROTONIX) 40 MG tablet Take 1 tablet (40 mg total) by mouth daily as needed. (Patient taking differently: Take 40 mg by mouth daily as needed (heartburn).) 90 tablet 1   thiamine 100 MG tablet Take 1 tablet (100 mg total) by mouth daily. (Patient not taking: Reported on 09/20/2021) 30 tablet 0   traZODone (DESYREL) 50 MG tablet Take 0.5 tablets (25 mg total) by mouth at bedtime. (Patient not taking: Reported on 09/20/2021) 15 tablet 0   No current facility-administered medications on file prior  to visit.    No Known Allergies  Social History   Socioeconomic History   Marital status: Married    Spouse name: Not on file   Number of children: Not on file   Years of education: Not on file   Highest education level: Not on file  Occupational History   Not on file  Tobacco Use   Smoking status: Former    Passive exposure: Past   Smokeless tobacco: Never  Vaping Use   Vaping Use: Never used  Substance and Sexual Activity   Alcohol use: Not Currently    Comment: 6 pack a day, none now 06-07-2020   Drug use: No   Sexual activity: Not on file  Other Topics Concern   Not on file  Social History Narrative   Not on file   Social Determinants of Health   Financial Resource Strain: Not on file  Food Insecurity: Not on file  Transportation Needs: Not on file  Physical Activity: Not on file  Stress: Not on file  Social Connections: Not on file  Intimate Partner Violence: Not on file    Family History  Problem Relation Age of Onset   Diabetes Mellitus II Neg Hx    Stomach cancer Neg Hx    Colon cancer Neg Hx     No past surgical history on file.  ROS: Review of Systems Negative except as stated above  PHYSICAL EXAM:  There were no vitals taken for this visit.  Physical Exam  {male adult master:310786} {male adult master:310785}     Latest Ref Rng & Units 09/20/2021    4:17 AM 02/20/2021   11:28 AM 10/31/2020    1:49 PM  CMP  Glucose 70 - 99 mg/dL 588  96    BUN 6 - 20 mg/dL 9  8    Creatinine 5.02 - 1.24 mg/dL 7.74  1.28    Sodium 786 - 145 mmol/L 138  142    Potassium 3.5 - 5.1 mmol/L 3.6  4.4    Chloride 98 - 111 mmol/L 103  105    CO2 22 - 32 mmol/L 20  20    Calcium 8.9 - 10.3 mg/dL 8.9  9.6    Total Protein 6.5 - 8.1 g/dL 7.5  6.9  7.2   Total Bilirubin 0.3 - 1.2 mg/dL 1.0  0.3  0.4   Alkaline Phos 38 - 126 U/L 72  91  86   AST 15 - 41 U/L 47  14  12   ALT 0 - 44 U/L 38  15  14    Lipid Panel     Component Value Date/Time   CHOL 176  06/20/2021 1145   TRIG 454 (H) 06/20/2021 1145   HDL 43 06/20/2021 1145   CHOLHDL 4.1 06/20/2021 1145   CHOLHDL 6.5 (H) 01/29/2017 0912   VLDL 44 (H) 10/02/2016 1141   LDLCALC 63 06/20/2021 1145   LDLCALC 143 (H) 01/29/2017 0912    CBC    Component Value Date/Time   WBC 7.3 09/20/2021 0417   RBC 5.03 09/20/2021 0417   HGB 16.3 09/20/2021 0417   HGB 15.6 02/20/2021 1128   HCT 44.5 09/20/2021 0417   HCT 46.6 02/20/2021 1128   PLT 287 09/20/2021 0417   PLT 287 02/20/2021 1128   MCV 88.5 09/20/2021 0417   MCV 90 02/20/2021 1128   MCH 32.4 09/20/2021 0417   MCHC 36.6 (H) 09/20/2021 0417   RDW 14.4 09/20/2021 0417   RDW 13.9 02/20/2021 1128   LYMPHSABS 1.6 11/24/2018 1550   MONOABS 0.5 05/20/2018 1316   EOSABS 0.0 11/24/2018 1550   BASOSABS 0.0 11/24/2018 1550    ASSESSMENT AND PLAN:  There are no diagnoses linked to this encounter.   Patient was given the opportunity to ask questions.  Patient verbalized understanding of the plan and was able to repeat key elements of the plan. Patient was given clear instructions to go to Emergency Department or return to medical center if symptoms don't improve, worsen, or new problems develop.The patient verbalized understanding.   No orders of the defined types were placed in this encounter.    Requested Prescriptions    No prescriptions requested or ordered in this encounter    No follow-ups on file.  Rema Fendt, NP

## 2022-04-27 ENCOUNTER — Ambulatory Visit: Payer: Self-pay | Admitting: Family

## 2022-04-27 DIAGNOSIS — I10 Essential (primary) hypertension: Secondary | ICD-10-CM

## 2022-04-27 DIAGNOSIS — E119 Type 2 diabetes mellitus without complications: Secondary | ICD-10-CM

## 2022-04-27 DIAGNOSIS — E785 Hyperlipidemia, unspecified: Secondary | ICD-10-CM

## 2022-04-27 DIAGNOSIS — Z789 Other specified health status: Secondary | ICD-10-CM

## 2022-04-30 NOTE — Progress Notes (Unsigned)
Patient ID: Derek Cruz, male    DOB: [birth year]  MRN: [id number]  CC: Chronic Care Management   Subjective: Derek Cruz is a 56 y.o. male who presents for chronic care management.   His concerns today include:  HTN - Lisinopril   DM - Metformin   HLD - Atorvastatin  Patient Active Problem List   Diagnosis Date Noted   Elevated liver enzymes    Alcohol abuse    Liver failure (HCC) 05/07/2020   Alcoholic hepatitis    Hyponatremia    Polycythemia    Hemoglobin A1c less than 7.0% 07/16/2019   Neck pain 03/31/2019   Muscle strain 03/31/2019   Undifferentiated abdominal pain 03/31/2019   Gastroesophageal reflux disease without esophagitis 11/24/2018   Chest discomfort 11/24/2018   Essential hypertension 11/24/2018   Pre-diabetes 11/24/2018   Hyperlipidemia 10/06/2016   Chest pain 09/26/2016   Diabetes mellitus type 2, controlled (HCC) 09/26/2016     Current Outpatient Medications on File Prior to Visit  Medication Sig Dispense Refill   atorvastatin (LIPITOR) 40 MG tablet Take 1 tablet (40 mg total) by mouth daily. 90 tablet 1   lisinopril (ZESTRIL) 20 MG tablet Take 1 tablet (20 mg total) by mouth daily. 30 tablet 2   metFORMIN (GLUCOPHAGE-XR) 500 MG 24 hr tablet Take 2 tablets (1,000 mg total) by mouth daily with breakfast. 60 tablet 2   ondansetron (ZOFRAN) 4 MG tablet Take 1 tablet (4 mg total) by mouth every 6 (six) hours. 12 tablet 0   pantoprazole (PROTONIX) 40 MG tablet Take 1 tablet (40 mg total) by mouth daily as needed. (Patient taking differently: Take 40 mg by mouth daily as needed (heartburn).) 90 tablet 1   thiamine 100 MG tablet Take 1 tablet (100 mg total) by mouth daily. (Patient not taking: Reported on 09/20/2021) 30 tablet 0   traZODone (DESYREL) 50 MG tablet Take 0.5 tablets (25 mg total) by mouth at bedtime. (Patient not taking: Reported on 09/20/2021) 15 tablet 0   No current facility-administered medications on file prior  to visit.    No Known Allergies  Social History   Socioeconomic History   Marital status: Married    Spouse name: Not on file   Number of children: Not on file   Years of education: Not on file   Highest education level: Not on file  Occupational History   Not on file  Tobacco Use   Smoking status: Former    Passive exposure: Past   Smokeless tobacco: Never  Vaping Use   Vaping Use: Never used  Substance and Sexual Activity   Alcohol use: Not Currently    Comment: 6 pack a day, none now 06-07-2020   Drug use: No   Sexual activity: Not on file  Other Topics Concern   Not on file  Social History Narrative   Not on file   Social Determinants of Health   Financial Resource Strain: Not on file  Food Insecurity: Not on file  Transportation Needs: Not on file  Physical Activity: Not on file  Stress: Not on file  Social Connections: Not on file  Intimate Partner Violence: Not on file    Family History  Problem Relation Age of Onset   Diabetes Mellitus II Neg Hx    Stomach cancer Neg Hx    Colon cancer Neg Hx     No past surgical history on file.  ROS: Review of Systems Negative except as stated above  PHYSICAL EXAM:  There were no vitals taken for this visit.  Physical Exam  {male adult master:310786} {male adult master:310785}     Latest Ref Rng & Units 09/20/2021    4:17 AM 02/20/2021   11:28 AM 10/31/2020    1:49 PM  CMP  Glucose 70 - 99 mg/dL 588  96    BUN 6 - 20 mg/dL 9  8    Creatinine 5.02 - 1.24 mg/dL 7.74  1.28    Sodium 786 - 145 mmol/L 138  142    Potassium 3.5 - 5.1 mmol/L 3.6  4.4    Chloride 98 - 111 mmol/L 103  105    CO2 22 - 32 mmol/L 20  20    Calcium 8.9 - 10.3 mg/dL 8.9  9.6    Total Protein 6.5 - 8.1 g/dL 7.5  6.9  7.2   Total Bilirubin 0.3 - 1.2 mg/dL 1.0  0.3  0.4   Alkaline Phos 38 - 126 U/L 72  91  86   AST 15 - 41 U/L 47  14  12   ALT 0 - 44 U/L 38  15  14    Lipid Panel     Component Value Date/Time   CHOL 176  06/20/2021 1145   TRIG 454 (H) 06/20/2021 1145   HDL 43 06/20/2021 1145   CHOLHDL 4.1 06/20/2021 1145   CHOLHDL 6.5 (H) 01/29/2017 0912   VLDL 44 (H) 10/02/2016 1141   LDLCALC 63 06/20/2021 1145   LDLCALC 143 (H) 01/29/2017 0912    CBC    Component Value Date/Time   WBC 7.3 09/20/2021 0417   RBC 5.03 09/20/2021 0417   HGB 16.3 09/20/2021 0417   HGB 15.6 02/20/2021 1128   HCT 44.5 09/20/2021 0417   HCT 46.6 02/20/2021 1128   PLT 287 09/20/2021 0417   PLT 287 02/20/2021 1128   MCV 88.5 09/20/2021 0417   MCV 90 02/20/2021 1128   MCH 32.4 09/20/2021 0417   MCHC 36.6 (H) 09/20/2021 0417   RDW 14.4 09/20/2021 0417   RDW 13.9 02/20/2021 1128   LYMPHSABS 1.6 11/24/2018 1550   MONOABS 0.5 05/20/2018 1316   EOSABS 0.0 11/24/2018 1550   BASOSABS 0.0 11/24/2018 1550    ASSESSMENT AND PLAN:  There are no diagnoses linked to this encounter.   Patient was given the opportunity to ask questions.  Patient verbalized understanding of the plan and was able to repeat key elements of the plan. Patient was given clear instructions to go to Emergency Department or return to medical center if symptoms don't improve, worsen, or new problems develop.The patient verbalized understanding.   No orders of the defined types were placed in this encounter.    Requested Prescriptions    No prescriptions requested or ordered in this encounter    No follow-ups on file.  Rema Fendt, NP

## 2022-05-01 ENCOUNTER — Other Ambulatory Visit: Payer: Self-pay

## 2022-05-01 ENCOUNTER — Ambulatory Visit: Payer: Self-pay | Admitting: Family

## 2022-05-01 VITALS — BP 119/73 | HR 87 | Temp 98.3°F | Resp 16 | Ht 65.98 in | Wt 180.0 lb

## 2022-05-01 DIAGNOSIS — Z789 Other specified health status: Secondary | ICD-10-CM

## 2022-05-01 DIAGNOSIS — E119 Type 2 diabetes mellitus without complications: Secondary | ICD-10-CM

## 2022-05-01 DIAGNOSIS — I10 Essential (primary) hypertension: Secondary | ICD-10-CM

## 2022-05-01 DIAGNOSIS — Z758 Other problems related to medical facilities and other health care: Secondary | ICD-10-CM

## 2022-05-01 DIAGNOSIS — E785 Hyperlipidemia, unspecified: Secondary | ICD-10-CM

## 2022-05-01 MED ORDER — METFORMIN HCL ER 500 MG PO TB24
1000.0000 mg | ORAL_TABLET | Freq: Every day | ORAL | 2 refills | Status: DC
  Filled 2022-05-01: qty 60, 30d supply, fill #0

## 2022-05-01 MED ORDER — ATORVASTATIN CALCIUM 40 MG PO TABS
40.0000 mg | ORAL_TABLET | Freq: Every day | ORAL | 2 refills | Status: DC
  Filled 2022-05-01: qty 30, 30d supply, fill #0

## 2022-05-01 MED ORDER — LISINOPRIL 20 MG PO TABS
20.0000 mg | ORAL_TABLET | Freq: Every day | ORAL | 2 refills | Status: DC
  Filled 2022-05-01: qty 30, 30d supply, fill #0

## 2022-05-01 NOTE — Progress Notes (Signed)
.  Pt presents for chronic care management   

## 2022-05-02 LAB — BASIC METABOLIC PANEL
BUN/Creatinine Ratio: 18 (ref 9–20)
BUN: 12 mg/dL (ref 6–24)
CO2: 21 mmol/L (ref 20–29)
Calcium: 9.6 mg/dL (ref 8.7–10.2)
Chloride: 98 mmol/L (ref 96–106)
Creatinine, Ser: 0.66 mg/dL — ABNORMAL LOW (ref 0.76–1.27)
Glucose: 275 mg/dL — ABNORMAL HIGH (ref 70–99)
Potassium: 4 mmol/L (ref 3.5–5.2)
Sodium: 137 mmol/L (ref 134–144)
eGFR: 110 mL/min/{1.73_m2} (ref >59)

## 2022-05-02 LAB — HEMOGLOBIN A1C
Est. average glucose Bld gHb Est-mCnc: 157 mg/dL
Hgb A1c MFr Bld: 7.1 % — ABNORMAL HIGH (ref 4.8–5.6)

## 2022-05-07 ENCOUNTER — Other Ambulatory Visit: Payer: Self-pay

## 2022-05-31 ENCOUNTER — Encounter: Payer: Self-pay | Admitting: Family

## 2022-07-30 NOTE — Progress Notes (Unsigned)
Patient ID: Derek Cruz, male    DOB: [birth year]  MRN: [id number]  CC: Chronic Care Management   Subjective: Derek Cruz is a 57 y.o. male who presents for chronic care management.   His concerns today include:  HTN - Lisinopril  DM - Metformin  HLD - Atorvastatin   Patient Active Problem List   Diagnosis Date Noted   Elevated liver enzymes    Alcohol abuse    Liver failure (HCC) 05/07/2020   Alcoholic hepatitis    Hyponatremia    Polycythemia    Hemoglobin A1c less than 7.0% 07/16/2019   Neck pain 03/31/2019   Muscle strain 03/31/2019   Undifferentiated abdominal pain 03/31/2019   Gastroesophageal reflux disease without esophagitis 11/24/2018   Chest discomfort 11/24/2018   Essential hypertension 11/24/2018   Pre-diabetes 11/24/2018   Hyperlipidemia 10/06/2016   Chest pain 09/26/2016   Diabetes mellitus type 2, controlled (HCC) 09/26/2016     Current Outpatient Medications on File Prior to Visit  Medication Sig Dispense Refill   atorvastatin (LIPITOR) 40 MG tablet Take 1 tablet (40 mg total) by mouth daily. 30 tablet 2   lisinopril (ZESTRIL) 20 MG tablet Take 1 tablet (20 mg total) by mouth daily. 30 tablet 2   metFORMIN (GLUCOPHAGE-XR) 500 MG 24 hr tablet Take 2 tablets (1,000 mg total) by mouth daily with breakfast. 60 tablet 2   ondansetron (ZOFRAN) 4 MG tablet Take 1 tablet (4 mg total) by mouth every 6 (six) hours. 12 tablet 0   pantoprazole (PROTONIX) 40 MG tablet Take 1 tablet (40 mg total) by mouth daily as needed. (Patient taking differently: Take 40 mg by mouth daily as needed (heartburn).) 90 tablet 1   thiamine 100 MG tablet Take 1 tablet (100 mg total) by mouth daily. (Patient not taking: Reported on 09/20/2021) 30 tablet 0   traZODone (DESYREL) 50 MG tablet Take 0.5 tablets (25 mg total) by mouth at bedtime. (Patient not taking: Reported on 09/20/2021) 15 tablet 0   No current facility-administered medications on file prior to  visit.    No Known Allergies  Social History   Socioeconomic History   Marital status: Married    Spouse name: Not on file   Number of children: Not on file   Years of education: Not on file   Highest education level: Not on file  Occupational History   Not on file  Tobacco Use   Smoking status: Former    Passive exposure: Past   Smokeless tobacco: Never  Vaping Use   Vaping Use: Never used  Substance and Sexual Activity   Alcohol use: Not Currently    Comment: 6 pack a day, none now 06-07-2020   Drug use: No   Sexual activity: Not on file  Other Topics Concern   Not on file  Social History Narrative   Not on file   Social Determinants of Health   Financial Resource Strain: Not on file  Food Insecurity: Not on file  Transportation Needs: Not on file  Physical Activity: Not on file  Stress: Not on file  Social Connections: Not on file  Intimate Partner Violence: Not on file    Family History  Problem Relation Age of Onset   Diabetes Mellitus II Neg Hx    Stomach cancer Neg Hx    Colon cancer Neg Hx     No past surgical history on file.  ROS: Review of Systems Negative except as stated above  PHYSICAL EXAM: There  were no vitals taken for this visit.  Physical Exam  {male adult master:310786} {male adult master:310785}     Latest Ref Rng & Units 05/01/2022    3:12 PM 09/20/2021    4:17 AM 02/20/2021   11:28 AM  CMP  Glucose 70 - 99 mg/dL 829  562  96   BUN 6 - 24 mg/dL 12  9  8    Creatinine 0.76 - 1.27 mg/dL 1.30  8.65  7.84   Sodium 134 - 144 mmol/L 137  138  142   Potassium 3.5 - 5.2 mmol/L 4.0  3.6  4.4   Chloride 96 - 106 mmol/L 98  103  105   CO2 20 - 29 mmol/L 21  20  20    Calcium 8.7 - 10.2 mg/dL 9.6  8.9  9.6   Total Protein 6.5 - 8.1 g/dL  7.5  6.9   Total Bilirubin 0.3 - 1.2 mg/dL  1.0  0.3   Alkaline Phos 38 - 126 U/L  72  91   AST 15 - 41 U/L  47  14   ALT 0 - 44 U/L  38  15    Lipid Panel     Component Value Date/Time    CHOL 176 06/20/2021 1145   TRIG 454 (H) 06/20/2021 1145   HDL 43 06/20/2021 1145   CHOLHDL 4.1 06/20/2021 1145   CHOLHDL 6.5 (H) 01/29/2017 0912   VLDL 44 (H) 10/02/2016 1141   LDLCALC 63 06/20/2021 1145   LDLCALC 143 (H) 01/29/2017 0912    CBC    Component Value Date/Time   WBC 7.3 09/20/2021 0417   RBC 5.03 09/20/2021 0417   HGB 16.3 09/20/2021 0417   HGB 15.6 02/20/2021 1128   HCT 44.5 09/20/2021 0417   HCT 46.6 02/20/2021 1128   PLT 287 09/20/2021 0417   PLT 287 02/20/2021 1128   MCV 88.5 09/20/2021 0417   MCV 90 02/20/2021 1128   MCH 32.4 09/20/2021 0417   MCHC 36.6 (H) 09/20/2021 0417   RDW 14.4 09/20/2021 0417   RDW 13.9 02/20/2021 1128   LYMPHSABS 1.6 11/24/2018 1550   MONOABS 0.5 05/20/2018 1316   EOSABS 0.0 11/24/2018 1550   BASOSABS 0.0 11/24/2018 1550    ASSESSMENT AND PLAN:  There are no diagnoses linked to this encounter.   Patient was given the opportunity to ask questions.  Patient verbalized understanding of the plan and was able to repeat key elements of the plan. Patient was given clear instructions to go to Emergency Department or return to medical center if symptoms don't improve, worsen, or new problems develop.The patient verbalized understanding.   No orders of the defined types were placed in this encounter.    Requested Prescriptions    No prescriptions requested or ordered in this encounter    No follow-ups on file.  Rema Fendt, NP

## 2022-08-01 ENCOUNTER — Encounter: Payer: Self-pay | Admitting: Family

## 2022-08-01 DIAGNOSIS — Z789 Other specified health status: Secondary | ICD-10-CM

## 2022-08-01 DIAGNOSIS — E785 Hyperlipidemia, unspecified: Secondary | ICD-10-CM

## 2022-08-01 DIAGNOSIS — E119 Type 2 diabetes mellitus without complications: Secondary | ICD-10-CM

## 2022-08-01 DIAGNOSIS — I10 Essential (primary) hypertension: Secondary | ICD-10-CM

## 2022-09-23 ENCOUNTER — Other Ambulatory Visit: Payer: Self-pay

## 2022-09-23 ENCOUNTER — Encounter (HOSPITAL_COMMUNITY): Payer: Self-pay

## 2022-09-23 ENCOUNTER — Emergency Department (HOSPITAL_COMMUNITY)
Admission: EM | Admit: 2022-09-23 | Discharge: 2022-09-24 | Disposition: A | Discharge status: Home / Self Care | Payer: Self-pay | Attending: Emergency Medicine | Admitting: Emergency Medicine

## 2022-09-23 ENCOUNTER — Emergency Department (HOSPITAL_COMMUNITY): Payer: Self-pay

## 2022-09-23 DIAGNOSIS — I10 Essential (primary) hypertension: Secondary | ICD-10-CM | POA: Insufficient documentation

## 2022-09-23 DIAGNOSIS — E1165 Type 2 diabetes mellitus with hyperglycemia: Secondary | ICD-10-CM | POA: Insufficient documentation

## 2022-09-23 DIAGNOSIS — R45851 Suicidal ideations: Secondary | ICD-10-CM | POA: Insufficient documentation

## 2022-09-23 DIAGNOSIS — E860 Dehydration: Secondary | ICD-10-CM | POA: Insufficient documentation

## 2022-09-23 DIAGNOSIS — R748 Abnormal levels of other serum enzymes: Secondary | ICD-10-CM

## 2022-09-23 DIAGNOSIS — F10930 Alcohol use, unspecified with withdrawal, uncomplicated: Secondary | ICD-10-CM

## 2022-09-23 DIAGNOSIS — R7401 Elevation of levels of liver transaminase levels: Secondary | ICD-10-CM | POA: Insufficient documentation

## 2022-09-23 DIAGNOSIS — F10130 Alcohol abuse with withdrawal, uncomplicated: Secondary | ICD-10-CM | POA: Insufficient documentation

## 2022-09-23 DIAGNOSIS — Z87891 Personal history of nicotine dependence: Secondary | ICD-10-CM | POA: Insufficient documentation

## 2022-09-23 DIAGNOSIS — Y907 Blood alcohol level of 200-239 mg/100 ml: Secondary | ICD-10-CM | POA: Insufficient documentation

## 2022-09-23 DIAGNOSIS — F1010 Alcohol abuse, uncomplicated: Secondary | ICD-10-CM | POA: Diagnosis present

## 2022-09-23 LAB — I-STAT VENOUS BLOOD GAS, ED
Acid-Base Excess: 0 mmol/L (ref 0.0–2.0)
Acid-Base Excess: 2 mmol/L (ref 0.0–2.0)
Bicarbonate: 23 mmol/L (ref 20.0–28.0)
Bicarbonate: 25.5 mmol/L (ref 20.0–28.0)
Calcium, Ion: 1.06 mmol/L — ABNORMAL LOW (ref 1.15–1.40)
Calcium, Ion: 1.14 mmol/L — ABNORMAL LOW (ref 1.15–1.40)
HCT: 40 % (ref 39.0–52.0)
HCT: 42 % (ref 39.0–52.0)
Hemoglobin: 13.6 g/dL (ref 13.0–17.0)
Hemoglobin: 14.3 g/dL (ref 13.0–17.0)
O2 Saturation: 95 %
O2 Saturation: 90 %
Potassium: 3.6 mmol/L (ref 3.5–5.1)
Potassium: 3.5 mmol/L (ref 3.5–5.1)
Sodium: 134 mmol/L — ABNORMAL LOW (ref 135–145)
Sodium: 133 mmol/L — ABNORMAL LOW (ref 135–145)
TCO2: 24 mmol/L (ref 22–32)
TCO2: 27 mmol/L (ref 22–32)
pCO2, Ven: 32.1 mmHg — ABNORMAL LOW (ref 44–60)
pCO2, Ven: 34.3 mmHg — ABNORMAL LOW (ref 44–60)
pH, Ven: 7.463 — ABNORMAL HIGH (ref 7.25–7.43)
pH, Ven: 7.48 — ABNORMAL HIGH (ref 7.25–7.43)
pO2, Ven: 68 mmHg — ABNORMAL HIGH (ref 32–45)
pO2, Ven: 53 mmHg — ABNORMAL HIGH (ref 32–45)

## 2022-09-23 LAB — RAPID URINE DRUG SCREEN, HOSP PERFORMED
Amphetamines: NOT DETECTED
Barbiturates: NOT DETECTED
Benzodiazepines: NOT DETECTED
Cocaine: NOT DETECTED
Opiates: NOT DETECTED
Tetrahydrocannabinol: NOT DETECTED

## 2022-09-23 LAB — CBC
HCT: 46.3 % (ref 39.0–52.0)
Hemoglobin: 16.8 g/dL (ref 13.0–17.0)
MCH: 32.4 pg (ref 26.0–34.0)
MCHC: 36.3 g/dL — ABNORMAL HIGH (ref 30.0–36.0)
MCV: 89.2 fL (ref 80.0–100.0)
Platelets: 164 10*3/uL (ref 150–400)
RBC: 5.19 MIL/uL (ref 4.22–5.81)
RDW: 13.6 % (ref 11.5–15.5)
WBC: 3.9 10*3/uL — ABNORMAL LOW (ref 4.0–10.5)
nRBC: 0 % (ref 0.0–0.2)

## 2022-09-23 LAB — URINALYSIS, ROUTINE W REFLEX MICROSCOPIC
Bacteria, UA: NONE SEEN
Bilirubin Urine: NEGATIVE
Glucose, UA: >=500 mg/dL — AB
Ketones, ur: 5 mg/dL — AB
Leukocytes,Ua: NEGATIVE
Nitrite: NEGATIVE
Protein, ur: 100 mg/dL — AB
Specific Gravity, Urine: 1.033 — ABNORMAL HIGH (ref 1.005–1.030)
pH: 6 (ref 5.0–8.0)

## 2022-09-23 LAB — BASIC METABOLIC PANEL
Anion gap: 19 — ABNORMAL HIGH (ref 5–15)
BUN: <5 mg/dL — ABNORMAL LOW (ref 6–20)
CO2: 19 mmol/L — ABNORMAL LOW (ref 22–32)
Calcium: 9.1 mg/dL (ref 8.9–10.3)
Chloride: 96 mmol/L — ABNORMAL LOW (ref 98–111)
Creatinine, Ser: 0.63 mg/dL (ref 0.61–1.24)
GFR, Estimated: >60 mL/min (ref >60)
Glucose, Bld: 315 mg/dL — ABNORMAL HIGH (ref 70–99)
Potassium: 3.6 mmol/L (ref 3.5–5.1)
Sodium: 134 mmol/L — ABNORMAL LOW (ref 135–145)

## 2022-09-23 LAB — HEPATIC FUNCTION PANEL
ALT: 152 U/L — ABNORMAL HIGH (ref 0–44)
AST: 127 U/L — ABNORMAL HIGH (ref 15–41)
Albumin: 4.1 g/dL (ref 3.5–5.0)
Alkaline Phosphatase: 111 U/L (ref 38–126)
Bilirubin, Direct: 0.2 mg/dL (ref 0.0–0.2)
Indirect Bilirubin: 0.4 mg/dL (ref 0.3–0.9)
Total Bilirubin: 0.6 mg/dL (ref 0.3–1.2)
Total Protein: 7.8 g/dL (ref 6.5–8.1)

## 2022-09-23 LAB — ETHANOL: Alcohol, Ethyl (B): 207 mg/dL — ABNORMAL HIGH (ref <10)

## 2022-09-23 LAB — TROPONIN I (HIGH SENSITIVITY)
Troponin I (High Sensitivity): 11 ng/L (ref <18)
Troponin I (High Sensitivity): 8 ng/L (ref <18)

## 2022-09-23 LAB — ACETAMINOPHEN LEVEL: Acetaminophen (Tylenol), Serum: <10 ug/mL — ABNORMAL LOW (ref 10–30)

## 2022-09-23 LAB — SALICYLATE LEVEL: Salicylate Lvl: <7 mg/dL — ABNORMAL LOW (ref 7.0–30.0)

## 2022-09-23 LAB — LIPASE, BLOOD: Lipase: 41 U/L (ref 11–51)

## 2022-09-23 LAB — CBG MONITORING, ED: Glucose-Capillary: 276 mg/dL — ABNORMAL HIGH (ref 70–99)

## 2022-09-23 MED ORDER — LORAZEPAM 2 MG/ML IJ SOLN
2.0000 mg | Freq: Once | INTRAMUSCULAR | Status: AC
  Administered 2022-09-23: 2 mg via INTRAVENOUS
  Filled 2022-09-23: qty 1

## 2022-09-23 MED ORDER — METFORMIN HCL ER 500 MG PO TB24
500.0000 mg | ORAL_TABLET | Freq: Every day | ORAL | Status: DC
  Administered 2022-09-24: 500 mg via ORAL
  Filled 2022-09-23 (×2): qty 1

## 2022-09-23 MED ORDER — KETOROLAC TROMETHAMINE 15 MG/ML IJ SOLN
15.0000 mg | Freq: Once | INTRAMUSCULAR | Status: AC
  Administered 2022-09-23: 15 mg via INTRAVENOUS
  Filled 2022-09-23: qty 1

## 2022-09-23 MED ORDER — LORAZEPAM 2 MG/ML IJ SOLN
1.0000 mg | INTRAMUSCULAR | Status: AC | PRN
  Administered 2022-09-23: 2 mg via INTRAVENOUS
  Administered 2022-09-23: 3 mg via INTRAVENOUS
  Administered 2022-09-24: 2 mg via INTRAVENOUS
  Filled 2022-09-23: qty 2
  Filled 2022-09-23 (×2): qty 1

## 2022-09-23 MED ORDER — PANTOPRAZOLE SODIUM 40 MG IV SOLR
40.0000 mg | Freq: Once | INTRAVENOUS | Status: AC
  Administered 2022-09-23: 40 mg via INTRAVENOUS
  Filled 2022-09-23: qty 10

## 2022-09-23 MED ORDER — THIAMINE HCL 100 MG/ML IJ SOLN: 100.0000 mg | Freq: Every day | INTRAMUSCULAR | Status: DC

## 2022-09-23 MED ORDER — LACTATED RINGERS IV BOLUS
1000.0000 mL | Freq: Once | INTRAVENOUS | Status: AC
  Administered 2022-09-23: 1000 mL via INTRAVENOUS

## 2022-09-23 MED ORDER — ADULT MULTIVITAMIN W/MINERALS CH
1.0000 | ORAL_TABLET | Freq: Every day | ORAL | Status: DC
  Administered 2022-09-23 – 2022-09-24 (×2): 1 via ORAL
  Filled 2022-09-23 (×2): qty 1

## 2022-09-23 MED ORDER — ATORVASTATIN CALCIUM 40 MG PO TABS
40.0000 mg | ORAL_TABLET | Freq: Every day | ORAL | Status: DC
  Administered 2022-09-23 – 2022-09-24 (×2): 40 mg via ORAL
  Filled 2022-09-23 (×2): qty 1

## 2022-09-23 MED ORDER — THIAMINE MONONITRATE 100 MG PO TABS
100.0000 mg | ORAL_TABLET | Freq: Every day | ORAL | Status: DC
  Administered 2022-09-23 – 2022-09-24 (×2): 100 mg via ORAL
  Filled 2022-09-23 (×2): qty 1

## 2022-09-23 MED ORDER — ONDANSETRON HCL 4 MG/2ML IJ SOLN
4.0000 mg | Freq: Once | INTRAMUSCULAR | Status: AC
  Administered 2022-09-23: 4 mg via INTRAVENOUS
  Filled 2022-09-23: qty 2

## 2022-09-23 MED ORDER — LORAZEPAM 1 MG PO TABS
1.0000 mg | ORAL_TABLET | ORAL | Status: AC | PRN
  Administered 2022-09-23: 1 mg via ORAL
  Filled 2022-09-23: qty 1

## 2022-09-23 MED ORDER — LACTATED RINGERS IV BOLUS
500.0000 mL | Freq: Once | INTRAVENOUS | Status: AC
  Administered 2022-09-23: 500 mL via INTRAVENOUS

## 2022-09-23 MED ORDER — FOLIC ACID 1 MG PO TABS
1.0000 mg | ORAL_TABLET | Freq: Every day | ORAL | Status: DC
  Administered 2022-09-23 – 2022-09-24 (×2): 1 mg via ORAL
  Filled 2022-09-23 (×2): qty 1

## 2022-09-23 MED ORDER — LISINOPRIL 20 MG PO TABS
20.0000 mg | ORAL_TABLET | Freq: Every day | ORAL | Status: DC
  Administered 2022-09-23 – 2022-09-24 (×2): 20 mg via ORAL
  Filled 2022-09-23 (×2): qty 1

## 2022-09-23 NOTE — ED Triage Notes (Signed)
Patient complains of chest pain and dizziness with headache since 7am. Patient states that the cp started at 7am and intermittent. Pain worse with breathing. Alert and oriented

## 2022-09-23 NOTE — ED Provider Notes (Cosign Needed Addendum)
Derek Cruz EMERGENCY DEPARTMENT AT [facility] Provider Note   CSN: [id number] Arrival date & time: 09/23/22  1308   Spanish interpretor, Derek Cruz, used via iPad interpretation service  History   Derek Cruz is a 57 y.o. male with past medical history hypertension and diabetes who presents to the ED with multiple complaints.  Patient states that he has had some marital strain and as a result of this he has been drinking alcohol heavily for the last month.  He reports that he drinks approximately 12 cans of beer daily.  Prior to this, patient denies heavy alcohol use.  He states that he has been drinking as he feels depressed and has thoughts of harming himself.  No active suicide plan.  He denies homicidal ideation or auditory or visual hallucinations.  He has never been treated for depression or any other psychiatric problems before.  He states that as result of his drinking he has been having symptoms including diffuse chest pain, epigastric and left upper quadrant abdominal pain, nausea, vomiting, diarrhea, and shortness of breath.  He reports that the symptoms improve when he drinks alcohol.  No hematemesis, melena, or hematochezia.  He denies fever.  He denies any fall or trauma.  Also has an associated bilateral frontal headache but no vision changes, focal weakness, syncope.  Patient does not measure his blood sugar at home and is unsure how well-controlled it is.  Wife and son at bedside confirms story.  The patient denies any other illicit substance use.  He is wanting help for his alcohol use.  No history of alcohol withdrawals or seizures.      Home Medications Prior to Admission medications   Medication Sig Start Date End Date Taking? Authorizing Provider  atorvastatin (LIPITOR) 40 MG tablet Take 1 tablet (40 mg total) by mouth daily. 05/01/22 07/30/22  Rema Fendt, NP  lisinopril (ZESTRIL) 20 MG tablet Take 1 tablet (20 mg total) by mouth daily. 05/01/22  07/30/22  Rema Fendt, NP  metFORMIN (GLUCOPHAGE-XR) 500 MG 24 hr tablet Take 2 tablets (1,000 mg total) by mouth daily with breakfast. 05/01/22 07/30/22  Rema Fendt, NP  ondansetron (ZOFRAN) 4 MG tablet Take 1 tablet (4 mg total) by mouth every 6 (six) hours. 09/20/21   Prosperi, Christian H, PA-C  pantoprazole (PROTONIX) 40 MG tablet Take 1 tablet (40 mg total) by mouth daily as needed. Patient taking differently: Take 40 mg by mouth daily as needed (heartburn). 09/11/21 03/10/22  Rema Fendt, NP  thiamine 100 MG tablet Take 1 tablet (100 mg total) by mouth daily. Patient not taking: Reported on 09/20/2021 05/10/20   Meccariello, Solmon Ice, MD  traZODone (DESYREL) 50 MG tablet Take 0.5 tablets (25 mg total) by mouth at bedtime. Patient not taking: Reported on 09/20/2021 09/11/21 10/20/21  Rema Fendt, NP      Allergies    Patient has no known allergies.    Review of Systems   Review of Systems  All other systems reviewed and are negative.   Physical Exam Updated Vital Signs BP (!) 140/85 (BP Location: Left Arm)   Pulse (!) 109   Temp 98.6 F (37 C) (Oral)   Resp (!) 22   SpO2 94%  Physical Exam Vitals and nursing note reviewed.  Constitutional:      General: He is not in acute distress.    Appearance: Normal appearance. He is not ill-appearing, toxic-appearing or diaphoretic.  HENT:     Head: Normocephalic  and atraumatic.     Mouth/Throat:     Mouth: Mucous membranes are dry.  Eyes:     General: No scleral icterus.    Extraocular Movements: Extraocular movements intact.     Conjunctiva/sclera: Conjunctivae normal.     Pupils: Pupils are equal, round, and reactive to light.  Cardiovascular:     Rate and Rhythm: Normal rate and regular rhythm.     Heart sounds: No murmur heard. Pulmonary:     Effort: Pulmonary effort is normal. No respiratory distress.     Breath sounds: Normal breath sounds. No stridor. No wheezing, rhonchi or rales.  Abdominal:     General:  Abdomen is flat. There is no distension.     Palpations: Abdomen is soft.     Tenderness: There is no abdominal tenderness. There is no right CVA tenderness, left CVA tenderness, guarding or rebound.  Musculoskeletal:        General: No deformity. Normal range of motion.     Cervical back: Normal range of motion and neck supple. No rigidity.     Right lower leg: No edema.     Left lower leg: No edema.  Skin:    General: Skin is warm and dry.     Capillary Refill: Capillary refill takes less than 2 seconds.     Coloration: Skin is not jaundiced or pale.     Findings: No rash.  Neurological:     Mental Status: He is alert and oriented to person, place, and time.     GCS: GCS eye subscore is 4. GCS verbal subscore is 5. GCS motor subscore is 6.     Cranial Nerves: Cranial nerves 2-12 are intact.     Sensory: Sensation is intact.     Motor: Motor function is intact. No weakness, tremor, atrophy, abnormal muscle tone or seizure activity.     Coordination: Coordination is intact.  Psychiatric:        Attention and Perception: He does not perceive auditory or visual hallucinations.        Mood and Affect: Mood is anxious and depressed. Affect is flat and tearful.        Speech: Speech normal.        Behavior: Behavior is cooperative.        Thought Content: Thought content is not paranoid or delusional. Thought content includes suicidal ideation. Thought content does not include homicidal ideation. Thought content includes suicidal plan. Thought content does not include homicidal plan.     ED Results / Procedures / Treatments   Labs (all labs ordered are listed, but only abnormal results are displayed) Labs Reviewed  BASIC METABOLIC PANEL - Abnormal; Notable for the following components:      Result Value   Sodium 134 (*)    Chloride 96 (*)    CO2 19 (*)    Glucose, Bld 315 (*)    BUN <5 (*)    Anion gap 19 (*)    All other components within normal limits  CBC - Abnormal; Notable  for the following components:   WBC 3.9 (*)    MCHC 36.3 (*)    All other components within normal limits  HEPATIC FUNCTION PANEL - Abnormal; Notable for the following components:   AST 127 (*)    ALT 152 (*)    All other components within normal limits  ETHANOL - Abnormal; Notable for the following components:   Alcohol, Ethyl (B) 207 (*)    All other components  within normal limits  ACETAMINOPHEN LEVEL - Abnormal; Notable for the following components:   Acetaminophen (Tylenol), Serum <10 (*)    All other components within normal limits  SALICYLATE LEVEL - Abnormal; Notable for the following components:   Salicylate Lvl <7.0 (*)    All other components within normal limits  URINALYSIS, ROUTINE W REFLEX MICROSCOPIC - Abnormal; Notable for the following components:   Specific Gravity, Urine 1.033 (*)    Glucose, UA >=500 (*)    Hgb urine dipstick MODERATE (*)    Ketones, ur 5 (*)    Protein, ur 100 (*)    All other components within normal limits  I-STAT VENOUS BLOOD GAS, ED - Abnormal; Notable for the following components:   pH, Ven 7.463 (*)    pCO2, Ven 32.1 (*)    pO2, Ven 68 (*)    Sodium 134 (*)    Calcium, Ion 1.06 (*)    All other components within normal limits  CBG MONITORING, ED - Abnormal; Notable for the following components:   Glucose-Capillary 276 (*)    All other components within normal limits  RAPID URINE DRUG SCREEN, HOSP PERFORMED  LIPASE, BLOOD  BLOOD GAS, VENOUS  TROPONIN I (HIGH SENSITIVITY)  TROPONIN I (HIGH SENSITIVITY)    EKG EKG reads as A-fib with rate control, however, on my review of this rhythm appears to be regular and is more consistent with normal sinus rhythm, no acute ST-T changes, will have EKG repeated and assessed by attending physician  Repeat EKG as sinus tachycardia at 108 bpm, no acute ST-T changes, no STEMI  Radiology DG Chest 2 View  Result Date: 09/23/2022 CLINICAL DATA:  Chest pain. EXAM: CHEST - 2 VIEW COMPARISON:   09/20/2021 FINDINGS: The heart size and mediastinal contours are within normal limits. Both lungs are clear. The visualized skeletal structures are unremarkable. IMPRESSION: No active cardiopulmonary disease. Electronically Signed   By: Danae Orleans M.D.   On: 09/23/2022 10:41    Procedures Procedures    Medications Ordered in ED Medications  LORazepam (ATIVAN) tablet 1-4 mg ( Oral See Alternative 09/23/22 1233)    Or  LORazepam (ATIVAN) injection 1-4 mg (2 mg Intravenous Given 09/23/22 1233)  thiamine (VITAMIN B1) tablet 100 mg (100 mg Oral Given 09/23/22 1219)    Or  thiamine (VITAMIN B1) injection 100 mg ( Intravenous See Alternative 09/23/22 1219)  folic acid (FOLVITE) tablet 1 mg (1 mg Oral Given 09/23/22 1219)  multivitamin with minerals tablet 1 tablet (1 tablet Oral Given 09/23/22 1219)  atorvastatin (LIPITOR) tablet 40 mg (40 mg Oral Given 09/23/22 1518)  lisinopril (ZESTRIL) tablet 20 mg (20 mg Oral Given 09/23/22 1518)  metFORMIN (GLUCOPHAGE-XR) 24 hr tablet 500 mg (has no administration in time range)  lactated ringers bolus 1,000 mL (has no administration in time range)  lactated ringers bolus 1,000 mL (0 mLs Intravenous Stopped 09/23/22 1405)  ondansetron (ZOFRAN) injection 4 mg (4 mg Intravenous Given 09/23/22 1217)  ketorolac (TORADOL) 15 MG/ML injection 15 mg (15 mg Intravenous Given 09/23/22 1218)  pantoprazole (PROTONIX) injection 40 mg (40 mg Intravenous Given 09/23/22 1218)  LORazepam (ATIVAN) injection 2 mg (2 mg Intravenous Given 09/23/22 1519)  lactated ringers bolus 500 mL (0 mLs Intravenous Stopped 09/23/22 1611)    ED Course/ Medical Decision Making/ A&P                             Medical Decision Making Amount  and/or Complexity of Data Reviewed Labs: ordered. Decision-making details documented in ED Course. Radiology: ordered. Decision-making details documented in ED Course. ECG/medicine tests: ordered. Decision-making details documented in ED Course.  Risk OTC  drugs. Prescription drug management. Decision regarding hospitalization.   Medical Decision Making:   Kingstin Hrag Litaker is a 57 y.o. male who presented to the ED today with alcohol abuse detailed above.    Additional history discussed with patient's family/caregivers.  Patient's presentation is complicated by their history of alcohol abuse, hypertension, diabetes.  Patient placed on continuous vitals and telemetry monitoring while in ED which was reviewed periodically.  Complete initial physical exam performed, notably the patient was in no acute distress.  Regular rate and rhythm.  Lungs clear to auscultation.  Abdomen soft and nontender.  Nonfocal neuroexam.  Patient does express suicidal ideation with intent and plan due to marital stress.  No homicidal ideation.  Does not appear to be responding to internal stimuli.  Reviewed and confirmed nursing documentation for past medical history, family history, social history.    Initial Assessment:   With the patient's presentation, differential diagnosis includes but is not limited to alcohol use disorder, polysubstance abuse, major depression, adjustment disorder, bipolar disorder, schizophrenia, hepatic dysfunction, acute kidney injury, gastritis, ACS, arrhythmia, electrolyte disturbance, dehydration. This is most consistent with an acute complicated illness  Initial Plan:  Screening labs including CBC and Metabolic panel to evaluate for infectious or metabolic etiology of disease.  Urinalysis with reflex culture and UDS ordered to evaluate for UTI or relevant urologic/nephrologic pathology.  CXR to evaluate for structural/infectious intrathoracic pathology.  EKG and troponin to evaluate for cardiac pathology Hepatic function panel to assess for liver failure Ethanol, acetaminophen, and salicylate levels to assess for overdose Lipase to assess for pancreatitis Symptomatic treatment TOC/TTS consult CIWA protocol ordered Objective  evaluation as below reviewed   Initial Study Results:   Laboratory  All laboratory results reviewed without evidence of clinically relevant pathology.   Exceptions include: pH 7.463, AST 127, ALT 152, UA positive for glucose and hemoglobin, alcohol level 207, sodium 134, chloride 96, glucose 315, anion gap 19  EKG EKG was reviewed independently.  Normal sinus rhythm on initial EKG with sinus tachycardia on repeat EKG.  No acute ST-T changes.  No STEMI.  Radiology:  All images reviewed independently. Agree with radiology report at this time.   DG Chest 2 View  Result Date: 09/23/2022 CLINICAL DATA:  Chest pain. EXAM: CHEST - 2 VIEW COMPARISON:  09/20/2021 FINDINGS: The heart size and mediastinal contours are within normal limits. Both lungs are clear. The visualized skeletal structures are unremarkable. IMPRESSION: No active cardiopulmonary disease. Electronically Signed   By: Danae Orleans M.D.   On: 09/23/2022 10:41      Final Assessment and Plan:   57 year old male presents to the ED for alcohol abuse.  Patient with multiple complaints as above.  He does appear slightly intoxicated but is neurologically intact and answering questions appropriately.  Also obtain history from family members at bedside including the son and wife.  Patient reports that for about a month now he has been heavily consuming alcohol due to marital stress.  States that he has become depressed and had increasing alcohol use over the last week with suicidal ideation with intent and plan.  Patient very vague and unable to elicit exactly what patient's plan is but he states that he has a date and time in mind to go through with suicide.  Abdomen soft and  nontender.  Elevated liver functions, suspect secondary to alcohol abuse.  Patient does not appear jaundiced.  Not encephalopathic.  EKG without acute ST-T changes.  Troponin negative x 2.  No significant electrolyte disturbance.  No AKI.  Does appear somewhat dehydrated so  fluids administered.  Blood glucose in the 300s but patient has not taken his medications today.  These medications were ordered.  Does not appear to be in DKA or HHS.  Chest x-ray negative.  CIWA protocol was ordered as patient expressed some issues with shaking a few hours after arriving to the ED and suspect this is secondary to mild alcohol withdrawal.  Overall, do believe patient would benefit from psychiatric evaluation given his suicidal intent.  Believe he is stable to follow-up outpatient with elevated liver enzymes.  Daily medications ordered.  Patient agreeable to undergo TTS consult which was placed.  Also consulted TOC for resources regarding alcohol use and they were placed in the chart.  Patient calm, cooperative, and wanting to undergo treatment for his condition.  Will remain voluntary at this time but should patient want to leave he should be reassessed. Discussed with attending physician who co-signed this note who agreed with management. Pt medically cleared for psychiatric evaluation.  Pending this consult but remains stable.   1900 - On reassessment, patient with substantial tremors despite 2 doses of lorazepam.  His chest pain has improved.  He does have some unsteady gait, persistent tachycardia, and is intermittently diaphoretic.  Suspect alcohol withdrawal.  Added additional fluids.  CIWA 18, patient to get an additional dose of Ativan.  Will continue to monitor for complications and have TTS consult.   1930 - On this reassessment alongside Dr. Rhunette Croft, patient denies suicidal ideation, plan, or intent.  He states that he wants help with his alcohol use.  Discussed with Dr. Antibody and we do not believe that medical admission is required at this time.  Will continue to manage his withdrawals using CIWA protocol.  Pending TTS consult.  2100 -patient pending medical clearance and TTS consult.  Will transition care to oncoming PA-C, Derek Cruz, for further evaluation and treatment as  needed.  Patient stable at time of care transition.  Clinical Impression:  1. Alcohol withdrawal syndrome without complication (HCC)   2. Dehydration   3. Elevated liver enzymes   4. Suicidal ideation   5. Hyperglycemia due to diabetes mellitus (HCC)      Data Unavailable           Final Clinical Impression(s) / ED Diagnoses Final diagnoses:  Dehydration  Elevated liver enzymes  Suicidal ideation  Hyperglycemia due to diabetes mellitus (HCC)  Alcohol withdrawal syndrome without complication [facility])    Rx / DC Orders ED Discharge Orders     None         Tonette Lederer, PA-C 09/23/22 1839    Richardson Dopp 09/23/22 2128    Derwood Kaplan, MD 09/24/22 1511

## 2022-09-23 NOTE — Care Management (Signed)
SA resources added to chart

## 2022-09-23 NOTE — ED Notes (Signed)
Voluntary consent form attached to the clipboard in orange zone

## 2022-09-23 NOTE — Discharge Instructions (Signed)
Intensive Outpatient Programs   [facility]                                  [facility] [address]                                                       [address] [place],  [place]                                                           [place], [place] [phone]                                                              [phone]   [facility]                [facility]         (Inpatient and outpatient)                                           [phone] (Suboxone and Methadone) [address]                                                                                                                 [phone]                                                                                                     [facility]                                               [facility] - Intensive Outpatient [address]  [address] [place], [place] [address]                                                 [place], [place]  [phone]                                                              [phone]   [facility] (Outpatient, Inpatient, Chemical       [facility] (Groups and [facility]) (insurance only) [phone]                                              [place], [place]                                                                                                             [phone]                                                    [facility]                                       [facility] (for caregivers and family) [address]                                                    [address] [place], [place]                                                          [place], [place] [phone]                                                               [phone]   Residential Treatment Programs   [facility]  [place](2 years) Residential: 90 days)                 [facility] [address] [facility]                                                            [address] [place], [place]                                                    [place], [place] [phone]                                                              [phone] or [phone]   [facility]                                              [facility] [address]                                                               [address] [place], [place]                                                          [place], [place] [phone]                                                              [phone]   [facility]                                [facility] ([facility]) [address]                                                           [address] [place], [place] [address]  [place], [place] [phone]                                                              [phone] Admissions: 8am-3pm M-F   [facility]: Residential Program [phone] Days)                     [facility]: [facility]  [place], [place]                                                    [place], [place]  [phone] or [phone]                                             (Walk in Hours over the weekend or by referral)     [facility]: [facility]:[phone] (for crisis response 24 hours a day)     Last Modified by Derek Cruz, LCSWA at 02/04/21 1447    Outpatient psychiatric Services  Walk in hours for medication management Monday, Wednesday, Thursday, and Friday from 8:00 AM to 11:00 AM Recommend arriving by by 7:30 AM.  It  is first come first serve.    Walk in hours for therapy intake Monday and Wednesday only 8:00 AM to 11:00 AM Encouraged to arrive by 7:30 AM.  It is first come first serve   Inpatient patient psychiatric services at [facility] offers comprehensive behavioral heath care services for mental health and substance abuse treatment.  Social work can also assist with referral to or getting you into a rehabilitation program short or long term

## 2022-09-23 NOTE — ED Notes (Signed)
Transfer of care report given to psych RN.

## 2022-09-23 NOTE — ED Notes (Signed)
Pt

## 2022-09-23 NOTE — ED Notes (Signed)
Charge informed of need for sitter

## 2022-09-23 NOTE — BH Assessment (Signed)
Clinician reached out to RN Shellon J to inquire on completing TTS assessment. Clinician informed patient is currently in the hallway. RN agreed to notify clinician when patient is able to engage in an assessment.  Manfred Arch, MSW, LCSW Triage Specialist

## 2022-09-23 NOTE — Consult Note (Signed)
Reached out to [place] Nickerson, RN caring for patient, requesting to see patient for psych assessment via tts cart. [place] states patient is not stable enough to be medically cleared; pt recently reassessed by the {A who is reaching out to the hospitalist for possible admission.

## 2022-09-23 NOTE — ED Notes (Signed)
Dow Adolph, PA at bedside reassessing patient.

## 2022-09-23 NOTE — ED Provider Notes (Signed)
  Physical Exam  BP 126/65   Pulse 92   Temp 98.5 F (36.9 C) (Oral)   Resp 18   SpO2 94%   Physical Exam Vitals and nursing note reviewed.  Constitutional:      General: He is not in acute distress.    Appearance: Normal appearance. He is normal weight. He is not ill-appearing.  HENT:     Head: Normocephalic and atraumatic.  Pulmonary:     Effort: Pulmonary effort is normal. No respiratory distress.  Abdominal:     General: Abdomen is flat.  Musculoskeletal:        General: Normal range of motion.     Cervical back: Neck supple.  Skin:    General: Skin is warm and dry.  Neurological:     Mental Status: He is alert and oriented to person, place, and time.  Psychiatric:        Mood and Affect: Mood normal.        Behavior: Behavior normal.     Procedures  Procedures  ED Course / MDM    Medical Decision Making Amount and/or Complexity of Data Reviewed Labs: ordered. Radiology: ordered.  Risk OTC drugs. Prescription drug management.  Assumed care at shift change from [facility], PA-C.  Please see her note for full HPI.  In short, 57 year old male presenting to the ED for evaluation of suicidal ideation.  Has been drinking quite a bit of alcohol recently to cope with his symptoms.  Does appear to be tremulous and is acutely intoxicated.  Current plan is to reassess patient throughout the night until you can be medically cleared for TTS consult.  This plan was discussed with attending physician, Dr. Rhunette Croft  2310-on reassessment, patient is resting comfortably in bed.  He is no longer tremulous.  States he feels well.  Denies nausea, vomiting, headaches.  He does not have a history of withdrawal seizures.  Patient is medically cleared for TTS consult at this time.  Note: Portions of this report may have been transcribed using voice recognition software. Every effort was made to ensure accuracy; however, inadvertent computerized transcription errors may still be  present.       Michelle Piper, Cordelia Poche 09/23/22 2309    Pricilla Loveless, MD 09/26/22 [phone]

## 2022-09-24 ENCOUNTER — Telehealth (HOSPITAL_COMMUNITY): Payer: Self-pay | Admitting: Licensed Clinical Social Worker

## 2022-09-24 DIAGNOSIS — F1010 Alcohol abuse, uncomplicated: Secondary | ICD-10-CM

## 2022-09-24 LAB — CBG MONITORING, ED: Glucose-Capillary: 239 mg/dL — ABNORMAL HIGH (ref 70–99)

## 2022-09-24 NOTE — Telephone Encounter (Signed)
The therapist attempts to reach this patient per the request of Ms. Eligha Bridegroom, NP. The therapist leaves a HIPAA-compliant voicemail with his direct callback number.  [address] MA, LCSW, [facility], LCAS 09/24/2022

## 2022-09-24 NOTE — Consult Note (Signed)
BH ED ASSESSMENT   Reason for Consult:  Etoh abuse Referring Physician:  Schutt Patient Identification: Derek Cruz MRN:  [id number] ED Chief Complaint: Alcohol abuse  Diagnosis:  Principal Problem:   Alcohol abuse   ED Assessment Time Calculation: Start Time: 0800 Stop Time: 0900 Total Time in Minutes (Assessment Completion): 60  HPI:   Derek Cruz is a 57 y.o. male patient  "with past medical history hypertension and diabetes who presents to the ED with multiple complaints.  Patient states that he has had some marital strain and as a result of this he has been drinking alcohol heavily for the last month.  He reports that he drinks approximately 12 cans of beer daily.  Prior to this, patient denies heavy alcohol use.  He states that he has been drinking as he feels depressed and has thoughts of harming himself.  No active suicide plan.  He denies homicidal ideation or auditory or visual hallucinations.  He has never been treated for depression or any other psychiatric problems before.  He states that as result of his drinking he has been having symptoms including diffuse chest pain, epigastric and left upper quadrant abdominal pain, nausea, vomiting, diarrhea, and shortness of breath.  He reports that the symptoms improve when he drinks alcohol.  No hematemesis, melena, or hematochezia.  He denies fever.  He denies any fall or trauma.  Also has an associated bilateral frontal headache but no vision changes, focal weakness, syncope.  Patient does not measure his blood sugar at home and is unsure how well-controlled it is.  Wife and son at bedside confirms story.  The patient denies any other illicit substance use.  He is wanting help for his alcohol use.  No history of alcohol withdrawals or seizures."  Subjective:   Patient seen this morning at [facility] for face to face psychiatric evaluation. Pt states he came to the hospital for "alcohol help." He reports drinking  intermittently most of his adult life, but he did not start drinking heavily daily until a few months ago. He states for the past three months he has been drinking around a 12 pack of beer daily. He does endorse stressors such as relationship issues with his wife, which have only worsened since he started drinking more heavily. He lives with his wife and his adult son. Pt denies previous psychiatric hx or dx. Pt denies any current suicidal ideations. Denies any history of suicide attempts. Denies HI. Denies AH/VH. He does not see a current psychiatrist or therapist. We spoke about possible treatment options, I offered him a bed at the [facility] for detox and possible substance abuse treatment program admission but he declined stating he wants to go home. We spoke about intensive outpatient for substance abuse which he was interested in, I secure messaged Alene Mires, LCSW to refer the patient to this program. Ultimately, he is able to contract for safety, and is denying inpatient or substance abuse treatment at this time and wishes to continue his sobriety journey at home. He does appear motivated to remain sober participate in OP services.   I attempted to call his wife, Iona Beard, at [phone] however no response. The patient's son, [name], called my number back, stating his mother is at work and he will speak to me on their behalf. Rene Paci has no concerns for the patient to return home today. He denies witnessing any suicidal or self harming behaviors at home. He is disappointed that his dad did not  want to go to a substance abuse program today. I spoke in great detail with Ismael about programs in the area they can take him to if he changes his mine, specifically the [facility]. Also mentioned the referral to intensive outpatient which he was pleased his dad agreed to this. Rene Paci is requesting resources be provided in discharge paperwork for substance abuse treatment, and he will come today to pick up  the patient.   Pt is psychiatrically cleared. Inpatient services and substance abuse tx was offered but patient declined. He does not meet criteria for  IVC. I have provided numerous resources in the AVS for substance abuse tx. ED team notified of disposition.   Past Psychiatric History:  none  Risk to Self or Others: Is the patient at risk to self? No Has the patient been a risk to self in the past 6 months? No Has the patient been a risk to self within the distant past? No Is the patient a risk to others? No Has the patient been a risk to others in the past 6 months? No Has the patient been a risk to others within the distant past? No  [place] Scale:  Flowsheet Row ED from 09/23/2022 in [facility] Emergency Department at [facility] ED from 09/20/2021 in [facility] Emergency Department at [facility]  C-SSRS RISK CATEGORY No Risk No Risk       Past Medical History:  Past Medical History:  Diagnosis Date   Alcohol abuse    Chest pain    Dyslipidemia    Elevated liver enzymes    Gastritis    Hypertension    History reviewed. No pertinent surgical history. Family History:  Family History  Problem Relation Age of Onset   Diabetes Mellitus II Neg Hx    Stomach cancer Neg Hx    Colon cancer Neg Hx    Social History:  Social History   Substance and Sexual Activity  Alcohol Use Not Currently   Comment: 6 pack a day, none now 06-07-2020     Social History   Substance and Sexual Activity  Drug Use No    Social History   Socioeconomic History   Marital status: Married    Spouse name: Not on file   Number of children: Not on file   Years of education: Not on file   Highest education level: Not on file  Occupational History   Not on file  Tobacco Use   Smoking status: Former    Passive exposure: Past   Smokeless tobacco: Never  Vaping Use   Vaping Use: Never used  Substance and Sexual Activity   Alcohol use: Not Currently    Comment: 6 pack a  day, none now 06-07-2020   Drug use: No   Sexual activity: Not on file  Other Topics Concern   Not on file  Social History Narrative   Not on file   Social Determinants of Health   Financial Resource Strain: Not on file  Food Insecurity: Not on file  Transportation Needs: Not on file  Physical Activity: Not on file  Stress: Not on file  Social Connections: Not on file   Additional Social History:    Allergies:  No Known Allergies  Labs:  Results for orders placed or performed during the hospital encounter of 09/23/22 (from the past 48 hour(s))  Basic metabolic panel     Status: Abnormal   Collection Time: 09/23/22  9:58 AM  Result Value Ref Range  Sodium 134 (L) 135 - 145 mmol/L   Potassium 3.6 3.5 - 5.1 mmol/L   Chloride 96 (L) 98 - 111 mmol/L   CO2 19 (L) 22 - 32 mmol/L   Glucose, Bld 315 (H) 70 - 99 mg/dL    Comment: Glucose reference range applies only to samples taken after fasting for at least 8 hours.   BUN <5 (L) 6 - 20 mg/dL   Creatinine, Ser 1.61 0.61 - 1.24 mg/dL   Calcium 9.1 8.9 - 09.6 mg/dL   GFR, Estimated >04 >54 mL/min    Comment: (NOTE) Calculated using the CKD-EPI Creatinine Equation (2021)    Anion gap 19 (H) 5 - 15    Comment: Performed at [facility], [address] [place], [place] [address]  CBC     Status: Abnormal   Collection Time: 09/23/22  9:58 AM  Result Value Ref Range   WBC 3.9 (L) 4.0 - 10.5 K/uL   RBC 5.19 4.22 - 5.81 MIL/uL   Hemoglobin 16.8 13.0 - 17.0 g/dL   HCT 91.4 78.2 - 95.6 %   MCV 89.2 80.0 - 100.0 fL   MCH 32.4 26.0 - 34.0 pg   MCHC 36.3 (H) 30.0 - 36.0 g/dL    Comment: CORRECTED FOR COLD AGGLUTININS   RDW 13.6 11.5 - 15.5 %   Platelets 164 150 - 400 K/uL    Comment: REPEATED TO VERIFY   nRBC 0.0 0.0 - 0.2 %    Comment: Performed at [facility], [address] [place], [place] [address]  Troponin I (High Sensitivity)     Status: None   Collection Time: 09/23/22  9:58 AM  Result Value Ref Range    Troponin I (High Sensitivity) 11 <18 ng/L    Comment: (NOTE) Elevated high sensitivity troponin I (hsTnI) values and significant  changes across serial measurements may suggest ACS but many other  chronic and acute conditions are known to elevate hsTnI results.  Refer to the "Links" section for chest pain algorithms and additional  guidance. Performed at [facility], [address] [place], [place] [address]   Ethanol     Status: Abnormal   Collection Time: 09/23/22 12:24 PM  Result Value Ref Range   Alcohol, Ethyl (B) 207 (H) <10 mg/dL    Comment: (NOTE) Lowest detectable limit for serum alcohol is 10 mg/dL.  For medical purposes only. Performed at [facility], [address], [place], [place] [address]   Acetaminophen level     Status: Abnormal   Collection Time: 09/23/22 12:24 PM  Result Value Ref Range   Acetaminophen (Tylenol), Serum <10 (L) 10 - 30 ug/mL    Comment: (NOTE) Therapeutic concentrations vary significantly. A range of 10-30 ug/mL  may be an effective concentration for many patients. However, some  are best treated at concentrations outside of this range. Acetaminophen concentrations >150 ug/mL at 4 hours after ingestion  and >50 ug/mL at 12 hours after ingestion are often associated with  toxic reactions.  Performed at [facility], [address], [place], [place] [address]   Salicylate level     Status: Abnormal   Collection Time: 09/23/22 12:24 PM  Result Value Ref Range   Salicylate Lvl <7.0 (L) 7.0 - 30.0 mg/dL    Comment: Performed at [facility], [address], [place], [place] [address]  Troponin I (High Sensitivity)     Status: None   Collection Time: 09/23/22  12:29 PM  Result Value Ref Range   Troponin I (High Sensitivity) 8 <18 ng/L    Comment: (NOTE) Elevated high sensitivity troponin I (hsTnI) values and significant  changes across serial measurements may suggest ACS but many other  chronic and acute  conditions are known to elevate hsTnI results.  Refer to the "Links" section for chest pain algorithms and additional  guidance. Performed at [facility], [address], [place], [place] [address]   Hepatic function panel     Status: Abnormal   Collection Time: 09/23/22 12:29 PM  Result Value Ref Range   Total Protein 7.8 6.5 - 8.1 g/dL   Albumin 4.1 3.5 - 5.0 g/dL   AST 604 (H) 15 - 41 U/L   ALT 152 (H) 0 - 44 U/L   Alkaline Phosphatase 111 38 - 126 U/L   Total Bilirubin 0.6 0.3 - 1.2 mg/dL   Bilirubin, Direct 0.2 0.0 - 0.2 mg/dL   Indirect Bilirubin 0.4 0.3 - 0.9 mg/dL    Comment: Performed at [facility], [address], [place], [place] [address]  Lipase, blood     Status: None   Collection Time: 09/23/22 12:29 PM  Result Value Ref Range   Lipase 41 11 - 51 U/L    Comment: Performed at [facility], [address] [place], [place] [address]  Rapid urine drug screen (hospital performed)     Status: None   Collection Time: 09/23/22 12:31 PM  Result Value Ref Range   Opiates NONE DETECTED NONE DETECTED   Cocaine NONE DETECTED NONE DETECTED   Benzodiazepines NONE DETECTED NONE DETECTED   Amphetamines NONE DETECTED NONE DETECTED   Tetrahydrocannabinol NONE DETECTED NONE DETECTED   Barbiturates NONE DETECTED NONE DETECTED    Comment: (NOTE) DRUG SCREEN FOR MEDICAL PURPOSES ONLY.  IF CONFIRMATION IS NEEDED FOR ANY PURPOSE, NOTIFY LAB WITHIN 5 DAYS.  LOWEST DETECTABLE LIMITS FOR URINE DRUG SCREEN Drug Class                     Cutoff (ng/mL) Amphetamine and metabolites    1000 Barbiturate and metabolites    200 Benzodiazepine                 200 Opiates and metabolites        300 Cocaine and metabolites        300 THC                            50 Performed at [facility], [address] [place], [place] [address]   Urinalysis, Routine w reflex microscopic -Urine, Clean Catch     Status: Abnormal   Collection Time: 09/23/22 12:31 PM   Result Value Ref Range   Color, Urine YELLOW YELLOW   APPearance CLEAR CLEAR   Specific Gravity, Urine 1.033 (H) 1.005 - 1.030   pH 6.0 5.0 - 8.0   Glucose, UA >=500 (A) NEGATIVE mg/dL   Hgb urine dipstick MODERATE (A) NEGATIVE   Bilirubin Urine NEGATIVE NEGATIVE   Ketones, ur 5 (A) NEGATIVE mg/dL   Protein, ur 621 (A) NEGATIVE mg/dL   Nitrite NEGATIVE NEGATIVE   Leukocytes,Ua NEGATIVE NEGATIVE   RBC / HPF 0-5 0 - 5 RBC/hpf   WBC, UA 0-5 0 - 5 WBC/hpf   Bacteria, UA NONE SEEN NONE SEEN   Squamous Epithelial / HPF 0-5 0 - 5 /HPF  Mucus PRESENT     Comment: Performed at [facility], [address], [place], [place] [address]  I-Stat venous blood gas, ED     Status: Abnormal   Collection Time: 09/23/22  4:30 PM  Result Value Ref Range   pH, Ven 7.463 (H) 7.25 - 7.43   pCO2, Ven 32.1 (L) 44 - 60 mmHg   pO2, Ven 68 (H) 32 - 45 mmHg   Bicarbonate 23.0 20.0 - 28.0 mmol/L   TCO2 24 22 - 32 mmol/L   O2 Saturation 95 %   Acid-Base Excess 0.0 0.0 - 2.0 mmol/L   Sodium 134 (L) 135 - 145 mmol/L   Potassium 3.6 3.5 - 5.1 mmol/L   Calcium, Ion 1.06 (L) 1.15 - 1.40 mmol/L   HCT 40.0 39.0 - 52.0 %   Hemoglobin 13.6 13.0 - 17.0 g/dL   Sample type VENOUS   POC CBG, ED     Status: Abnormal   Collection Time: 09/23/22  6:54 PM  Result Value Ref Range   Glucose-Capillary 276 (H) 70 - 99 mg/dL    Comment: Glucose reference range applies only to samples taken after fasting for at least 8 hours.   Comment 1 Notify RN   I-Stat venous blood gas, ED     Status: Abnormal   Collection Time: 09/23/22  9:44 PM  Result Value Ref Range   pH, Ven 7.480 (H) 7.25 - 7.43   pCO2, Ven 34.3 (L) 44 - 60 mmHg   pO2, Ven 53 (H) 32 - 45 mmHg   Bicarbonate 25.5 20.0 - 28.0 mmol/L   TCO2 27 22 - 32 mmol/L   O2 Saturation 90 %   Acid-Base Excess 2.0 0.0 - 2.0 mmol/L   Sodium 133 (L) 135 - 145 mmol/L   Potassium 3.5 3.5 - 5.1 mmol/L   Calcium, Ion 1.14 (L) 1.15 - 1.40 mmol/L   HCT 42.0 39.0 - 52.0 %    Hemoglobin 14.3 13.0 - 17.0 g/dL   Sample type VENOUS   POC CBG, ED     Status: Abnormal   Collection Time: 09/24/22  8:07 AM  Result Value Ref Range   Glucose-Capillary 239 (H) 70 - 99 mg/dL    Comment: Glucose reference range applies only to samples taken after fasting for at least 8 hours.   Comment 1 Notify RN    Comment 2 Document in Chart     Current Facility-Administered Medications  Medication Dose Route Frequency Provider Last Rate Last Admin   atorvastatin (LIPITOR) tablet 40 mg  40 mg Oral Daily Gowens, Mariah L, PA-C   40 mg at 09/24/22 0926   folic acid (FOLVITE) tablet 1 mg  1 mg Oral Daily Gowens, Mariah L, PA-C   1 mg at 09/24/22 0926   lisinopril (ZESTRIL) tablet 20 mg  20 mg Oral Daily Gowens, Mariah L, PA-C   20 mg at 09/24/22 0926   metFORMIN (GLUCOPHAGE-XR) 24 hr tablet 500 mg  500 mg Oral Q breakfast Gowens, Mariah L, PA-C   500 mg at 09/24/22 0926   multivitamin with minerals tablet 1 tablet  1 tablet Oral Daily Gowens, Mariah L, PA-C   1 tablet at 09/24/22 0926   thiamine (VITAMIN B1) tablet 100 mg  100 mg Oral Daily Gowens, Mariah L, PA-C   100 mg at 09/24/22 6045   Or   thiamine (VITAMIN B1) injection 100 mg  100 mg Intravenous Daily Gowens, Mariah L, PA-C       Current  Outpatient Medications  Medication Sig Dispense Refill   atorvastatin (LIPITOR) 40 MG tablet Take 1 tablet (40 mg total) by mouth daily. 30 tablet 2   lisinopril (ZESTRIL) 20 MG tablet Take 1 tablet (20 mg total) by mouth daily. 30 tablet 2   metFORMIN (GLUCOPHAGE-XR) 500 MG 24 hr tablet Take 2 tablets (1,000 mg total) by mouth daily with breakfast. 60 tablet 2   ondansetron (ZOFRAN) 4 MG tablet Take 1 tablet (4 mg total) by mouth every 6 (six) hours. 12 tablet 0   pantoprazole (PROTONIX) 40 MG tablet Take 1 tablet (40 mg total) by mouth daily as needed. (Patient taking differently: Take 40 mg by mouth daily as needed (heartburn).) 90 tablet 1   thiamine 100 MG tablet Take 1 tablet (100 mg total)  by mouth daily. (Patient not taking: Reported on 09/20/2021) 30 tablet 0   traZODone (DESYREL) 50 MG tablet Take 0.5 tablets (25 mg total) by mouth at bedtime. (Patient not taking: Reported on 09/20/2021) 15 tablet 0   Psychiatric Specialty Exam: Presentation  General Appearance:  Appropriate for Environment  Eye Contact: Fair  Speech: Clear and Coherent  Speech Volume: Normal  Handedness:No data recorded  Mood and Affect  Mood: Euthymic  Affect: Congruent   Thought Process  Thought Processes: Coherent  Descriptions of Associations:Intact  Orientation:Full (Time, Place and Person)  Thought Content:Logical  History of Schizophrenia/Schizoaffective disorder:No data recorded Duration of Psychotic Symptoms:No data recorded Hallucinations:Hallucinations: None  Ideas of Reference:None  Suicidal Thoughts:Suicidal Thoughts: No  Homicidal Thoughts:Homicidal Thoughts: No   Sensorium  Memory: Immediate Fair; Recent Fair  Judgment: Fair  Insight: Fair   Art therapist  Concentration: Good  Attention Span: Good  Recall: Good  Fund of Knowledge: Good  Language: Good   Psychomotor Activity  Psychomotor Activity: Psychomotor Activity: Normal   Assets  Assets: Desire for Improvement; Leisure Time; Physical Health; Social Support; Resilience    Sleep  Sleep: Sleep: Fair   Physical Exam: Physical Exam Neurological:     Mental Status: He is alert and oriented to person, place, and time.  Psychiatric:        Attention and Perception: Attention normal.        Mood and Affect: Mood is anxious.        Speech: Speech normal.        Behavior: Behavior is cooperative.        Thought Content: Thought content normal.    Review of Systems  Psychiatric/Behavioral:  Positive for depression and substance abuse. The patient is nervous/anxious.   All other systems reviewed and are negative.  Blood pressure 109/69, pulse 77, temperature 98.4 F  (36.9 C), temperature source Oral, resp. rate 18, SpO2 93 %. There is no height or weight on file to calculate BMI.  Medical Decision Making: Pt case reviewed and discussed with Dr. Lucianne Muss. Will psychiatrically clear the patient at this time. ED team notified.   - substance abuse resources and OP services provided in AVS - a referral for [facility] has been made to Alene Mires, LCSW  Disposition: No evidence of imminent risk to self or others at present.   Patient does not meet criteria for psychiatric inpatient admission. Supportive therapy provided about ongoing stressors. Refer to IOP. Discussed crisis plan, support from social network, calling 911, coming to the Emergency Department, and calling Suicide Hotline.  Eligha Bridegroom, NP 09/24/2022 9:27 AM

## 2022-09-24 NOTE — ED Provider Notes (Signed)
Emergency Medicine Observation Re-evaluation Note  Derek Cruz is a 57 y.o. male, seen on rounds today.  Pt initially presented to the ED for complaints of Chest Pain, Dizziness, and Headache Currently, the patient is resting comfortably.  Physical Exam  BP 125/72   Pulse 78   Temp 98.4 F (36.9 C) (Oral)   Resp 17   SpO2 94%  Physical Exam Constitutional:      General: He is not in acute distress.    Appearance: Normal appearance.  HENT:     Head: Normocephalic and atraumatic.     Nose: No congestion or rhinorrhea.  Eyes:     General:        Right eye: No discharge.        Left eye: No discharge.     Extraocular Movements: Extraocular movements intact.     Pupils: Pupils are equal, round, and reactive to light.  Cardiovascular:     Rate and Rhythm: Normal rate and regular rhythm.     Heart sounds: No murmur heard. Pulmonary:     Effort: No respiratory distress.     Breath sounds: No wheezing or rales.  Abdominal:     General: There is no distension.     Tenderness: There is no abdominal tenderness.  Musculoskeletal:        General: Normal range of motion.     Cervical back: Normal range of motion.  Skin:    General: Skin is warm and dry.  Neurological:     General: No focal deficit present.     Mental Status: He is alert.      ED Course / MDM  EKG:EKG Interpretation  Date/Time:  Sunday Sep 23 2022 18:05:19 EDT Ventricular Rate:  108 PR Interval:  139 QRS Duration: 96 QT Interval:  363 QTC Calculation: 487 R Axis:   -30 Text Interpretation: Sinus tachycardia Left axis deviation Borderline ST elevation, anterior leads Borderline prolonged QT interval When compared with ECG of EARLIER SAME DATE No significant change was found Confirmed by Dione Booze ([id number]) on 09/24/2022 2:35:43 AM  I have reviewed the labs performed to date as well as medications administered while in observation.  Recent changes in the last 24 hours include TTS evaluation,  offered rehab but declined. TTS recommending dc  Plan  Current plan is for dc.    Glendora Score, MD 09/24/22 [id number]

## 2022-09-25 ENCOUNTER — Other Ambulatory Visit (HOSPITAL_COMMUNITY)
Admission: EM | Admit: 2022-09-25 | Discharge: 2022-09-26 | Disposition: A | Discharge status: Other Acute Inpatient Hospital | Payer: No Payment, Other | Attending: Psychiatry | Admitting: Psychiatry

## 2022-09-25 DIAGNOSIS — E785 Hyperlipidemia, unspecified: Secondary | ICD-10-CM | POA: Insufficient documentation

## 2022-09-25 DIAGNOSIS — K7010 Alcoholic hepatitis without ascites: Secondary | ICD-10-CM | POA: Diagnosis not present

## 2022-09-25 DIAGNOSIS — E1165 Type 2 diabetes mellitus with hyperglycemia: Secondary | ICD-10-CM

## 2022-09-25 DIAGNOSIS — E119 Type 2 diabetes mellitus without complications: Secondary | ICD-10-CM | POA: Insufficient documentation

## 2022-09-25 DIAGNOSIS — F1010 Alcohol abuse, uncomplicated: Secondary | ICD-10-CM | POA: Diagnosis present

## 2022-09-25 DIAGNOSIS — F1020 Alcohol dependence, uncomplicated: Secondary | ICD-10-CM | POA: Diagnosis not present

## 2022-09-25 DIAGNOSIS — R4589 Other symptoms and signs involving emotional state: Secondary | ICD-10-CM

## 2022-09-25 DIAGNOSIS — K7290 Hepatic failure, unspecified without coma: Secondary | ICD-10-CM | POA: Insufficient documentation

## 2022-09-25 DIAGNOSIS — K219 Gastro-esophageal reflux disease without esophagitis: Secondary | ICD-10-CM | POA: Insufficient documentation

## 2022-09-25 DIAGNOSIS — F419 Anxiety disorder, unspecified: Secondary | ICD-10-CM | POA: Insufficient documentation

## 2022-09-25 LAB — COMPREHENSIVE METABOLIC PANEL
ALT: 172 U/L — ABNORMAL HIGH (ref 0–44)
AST: 111 U/L — ABNORMAL HIGH (ref 15–41)
Albumin: 4.2 g/dL (ref 3.5–5.0)
Alkaline Phosphatase: 132 U/L — ABNORMAL HIGH (ref 38–126)
Anion gap: 16 — ABNORMAL HIGH (ref 5–15)
BUN: <5 mg/dL — ABNORMAL LOW (ref 6–20)
CO2: 17 mmol/L — ABNORMAL LOW (ref 22–32)
Calcium: 9.7 mg/dL (ref 8.9–10.3)
Chloride: 100 mmol/L (ref 98–111)
Creatinine, Ser: 0.64 mg/dL (ref 0.61–1.24)
GFR, Estimated: >60 mL/min (ref >60)
Glucose, Bld: 251 mg/dL — ABNORMAL HIGH (ref 70–99)
Potassium: 3.5 mmol/L (ref 3.5–5.1)
Sodium: 133 mmol/L — ABNORMAL LOW (ref 135–145)
Total Bilirubin: 0.5 mg/dL (ref 0.3–1.2)
Total Protein: 7.4 g/dL (ref 6.5–8.1)

## 2022-09-25 LAB — HEMOGLOBIN A1C
Hgb A1c MFr Bld: 10.8 % — ABNORMAL HIGH (ref 4.8–5.6)
Mean Plasma Glucose: 263.26 mg/dL

## 2022-09-25 LAB — CBC WITH DIFFERENTIAL/PLATELET
Abs Immature Granulocytes: 0.02 10*3/uL (ref 0.00–0.07)
Basophils Absolute: 0 10*3/uL (ref 0.0–0.1)
Basophils Relative: 1 %
Eosinophils Absolute: 0.1 10*3/uL (ref 0.0–0.5)
Eosinophils Relative: 1 %
HCT: 46 % (ref 39.0–52.0)
Hemoglobin: 16.9 g/dL (ref 13.0–17.0)
Immature Granulocytes: 0 %
Lymphocytes Relative: 32 %
Lymphs Abs: 1.7 10*3/uL (ref 0.7–4.0)
MCH: 32.5 pg (ref 26.0–34.0)
MCHC: 36.7 g/dL — ABNORMAL HIGH (ref 30.0–36.0)
MCV: 88.5 fL (ref 80.0–100.0)
Monocytes Absolute: 0.6 10*3/uL (ref 0.1–1.0)
Monocytes Relative: 11 %
Neutro Abs: 2.8 10*3/uL (ref 1.7–7.7)
Neutrophils Relative %: 55 %
Platelets: 146 10*3/uL — ABNORMAL LOW (ref 150–400)
RBC: 5.2 MIL/uL (ref 4.22–5.81)
RDW: 13.2 % (ref 11.5–15.5)
WBC: 5.2 10*3/uL (ref 4.0–10.5)
nRBC: 0 % (ref 0.0–0.2)

## 2022-09-25 LAB — TSH: TSH: 1.526 u[IU]/mL (ref 0.350–4.500)

## 2022-09-25 LAB — LIPID PANEL
Cholesterol: 192 mg/dL (ref 0–200)
HDL: 98 mg/dL (ref >40)
LDL Cholesterol: 60 mg/dL (ref 0–99)
Total CHOL/HDL Ratio: 2 RATIO
Triglycerides: 169 mg/dL — ABNORMAL HIGH (ref <150)
VLDL: 34 mg/dL (ref 0–40)

## 2022-09-25 LAB — ETHANOL: Alcohol, Ethyl (B): 290 mg/dL — ABNORMAL HIGH (ref <10)

## 2022-09-25 MED ORDER — ZIPRASIDONE MESYLATE 20 MG IM SOLR: 20.0000 mg | INTRAMUSCULAR | Status: DC | PRN

## 2022-09-25 MED ORDER — OLANZAPINE 5 MG PO TBDP: 5.0000 mg | ORAL_TABLET | Freq: Three times a day (TID) | ORAL | Status: DC | PRN

## 2022-09-25 MED ORDER — LORAZEPAM 1 MG PO TABS: 1.0000 mg | ORAL_TABLET | Freq: Two times a day (BID) | ORAL | Status: DC

## 2022-09-25 MED ORDER — HYDROXYZINE HCL 25 MG PO TABS
25.0000 mg | ORAL_TABLET | Freq: Four times a day (QID) | ORAL | Status: DC | PRN
  Administered 2022-09-25 – 2022-09-26 (×2): 25 mg via ORAL
  Filled 2022-09-25 (×2): qty 1

## 2022-09-25 MED ORDER — LORAZEPAM 1 MG PO TABS
1.0000 mg | ORAL_TABLET | Freq: Four times a day (QID) | ORAL | Status: DC | PRN
  Administered 2022-09-26: 1 mg via ORAL
  Filled 2022-09-25: qty 1

## 2022-09-25 MED ORDER — THIAMINE HCL 100 MG/ML IJ SOLN
100.0000 mg | Freq: Once | INTRAMUSCULAR | Status: AC
  Administered 2022-09-25: 100 mg via INTRAMUSCULAR
  Filled 2022-09-25: qty 2

## 2022-09-25 MED ORDER — MAGNESIUM HYDROXIDE 400 MG/5ML PO SUSP: 30.0000 mL | Freq: Every day | ORAL | Status: DC | PRN

## 2022-09-25 MED ORDER — LORAZEPAM 1 MG PO TABS: 1.0000 mg | ORAL_TABLET | Freq: Three times a day (TID) | ORAL | Status: DC

## 2022-09-25 MED ORDER — LORAZEPAM 1 MG PO TABS: 1.0000 mg | ORAL_TABLET | Freq: Every day | ORAL | Status: DC

## 2022-09-25 MED ORDER — ADULT MULTIVITAMIN W/MINERALS CH
1.0000 | ORAL_TABLET | Freq: Every day | ORAL | Status: DC
  Administered 2022-09-26: 1 via ORAL
  Filled 2022-09-25: qty 1

## 2022-09-25 MED ORDER — ATORVASTATIN CALCIUM 40 MG PO TABS
40.0000 mg | ORAL_TABLET | Freq: Every day | ORAL | Status: DC
  Administered 2022-09-26: 40 mg via ORAL
  Filled 2022-09-25: qty 1

## 2022-09-25 MED ORDER — LORAZEPAM 1 MG PO TABS
1.0000 mg | ORAL_TABLET | Freq: Four times a day (QID) | ORAL | Status: DC
  Administered 2022-09-25: 1 mg via ORAL
  Filled 2022-09-25 (×2): qty 1

## 2022-09-25 MED ORDER — ONDANSETRON 4 MG PO TBDP
4.0000 mg | ORAL_TABLET | Freq: Four times a day (QID) | ORAL | Status: DC | PRN
  Administered 2022-09-26: 4 mg via ORAL
  Filled 2022-09-25: qty 1

## 2022-09-25 MED ORDER — THIAMINE MONONITRATE 100 MG PO TABS
100.0000 mg | ORAL_TABLET | Freq: Every day | ORAL | Status: DC
  Administered 2022-09-26: 100 mg via ORAL
  Filled 2022-09-25: qty 1

## 2022-09-25 MED ORDER — ALUM & MAG HYDROXIDE-SIMETH 200-200-20 MG/5ML PO SUSP: 30.0000 mL | ORAL | Status: DC | PRN

## 2022-09-25 MED ORDER — METFORMIN HCL ER 500 MG PO TB24
1000.0000 mg | ORAL_TABLET | Freq: Every day | ORAL | Status: DC
  Administered 2022-09-26: 1000 mg via ORAL
  Filled 2022-09-25: qty 2

## 2022-09-25 MED ORDER — LISINOPRIL 20 MG PO TABS
20.0000 mg | ORAL_TABLET | Freq: Every day | ORAL | Status: DC
  Administered 2022-09-25 – 2022-09-26 (×2): 20 mg via ORAL
  Filled 2022-09-25 (×2): qty 1

## 2022-09-25 MED ORDER — LOPERAMIDE HCL 2 MG PO CAPS: 2.0000 mg | ORAL_CAPSULE | ORAL | Status: DC | PRN

## 2022-09-25 MED ORDER — ACETAMINOPHEN 325 MG PO TABS: 650.0000 mg | ORAL_TABLET | Freq: Four times a day (QID) | ORAL | Status: DC | PRN

## 2022-09-25 NOTE — Group Note (Signed)
Group Topic: Social Support  Group Date: 09/25/2022 Start Time: 0800 End Time: 0830 Facilitators: Rae Lips B  Department: [facility]  Number of Participants: 7  Group Focus: acceptance Treatment Modality:  Individual Therapy Interventions utilized were leisure development Purpose: improve communication skills  Name: Derek Cruz Date of Birth: Aug 24, 1965  MR: [id number]    Level of Participation: active Quality of Participation: attentive Interactions with others: gave feedback Mood/Affect: appropriate Triggers (if applicable): NA Cognition: insightful Progress: Gaining insight Response: NA Plan: patient will be encouraged to keep going to groups  Patients Problems:  Patient Active Problem List   Diagnosis Date Noted   Elevated liver enzymes    Alcohol abuse    Liver failure (HCC) 05/07/2020   Alcoholic hepatitis    Hyponatremia    Polycythemia    Hemoglobin A1c less than 7.0% 07/16/2019   Neck pain 03/31/2019   Muscle strain 03/31/2019   Undifferentiated abdominal pain 03/31/2019   Gastroesophageal reflux disease without esophagitis 11/24/2018   Chest discomfort 11/24/2018   Essential hypertension 11/24/2018   Pre-diabetes 11/24/2018   Hyperlipidemia 10/06/2016   Chest pain 09/26/2016   Diabetes mellitus type 2, controlled (HCC) 09/26/2016

## 2022-09-25 NOTE — BH Assessment (Signed)
Comprehensive Clinical Assessment (CCA) Note  09/25/2022 Derek Cruz [id number]  Disposition: Derek Guadeloupe, NP, patient meets inpatient criteria for [facility]. Patient admitted to [facility].  The patient demonstrates the following risk factors for suicide: Chronic risk factors for suicide include: psychiatric disorder of depression and substance use disorder. Acute risk factors for suicide include: family or marital conflict. Protective factors for this patient include: positive social support, responsibility to others (children, family), coping skills, and hope for the future. Considering these factors, the overall suicide risk at this point appears to be high. Patient is not appropriate for outpatient follow up.  Derek Cruz is a 57 year old male presenting as a voluntary walk-in to [facility] due to alcohol detox. Patient denied SI, HI, psychosis and drug usage. Patient reports drinking for the past 3 months. Patient reports drinking 4 beers around 4pm today. Patient reports feeling better when he drinks. Patient reported occasionally drinking for adult life, however his drinking has worsened in the past 3 months as he is now drinking daily. Patient reported to triage clinician drinking 12 pack daily and reported to TTS clinician 4-5 beers daily. Patient denied being depressed. Patient reported poor sleep and normal appetite.   Patient denied receiving outpatient mental health services. Patient denied being prescribed psych medications. Patient denied prior substance abuse treatment. Patient denied prior psych hospitalizations, suicide attempts, self-harming behaviors and substance abuse treatment.   Patient resides with long term girlfriend of 25 years, 2 daughters (21 and 25) and 14 year old son. Patient reports family discord due to his current drinking. Patient is currently employed as a Designer, fashion/clothing and reports no work related stressors. Patient denied  access to guns. Patient was cooperative during assessment.   Chief Complaint:  Chief Complaint  Patient presents with   Alcohol Problem   Visit Diagnosis:  Alcohol Dependence    CCA Screening, Triage and Referral (STR)  Patient Reported Information How did you hear about Derek Cruz? Family/Friend  What Is the Reason for Your Visit/Call Today? Pt presents to [facility] voluntarily, accompanied by his son requesting treatment for substance abuse. Pt reports drinking 3-4 beers around 4pm today because he feels really bad when he does not drink. Pt reports feeling shaky, chest pain and nausea.He reports drinking intermittently most of his adult life, but he did not start drinking heavily daily until a few months ago. He states for the past three months he has been drinking around a 12 pack of beer daily. He does endorse stressors such as relationship issues with his wife, which have only worsened since he started drinking more heavily. Pt reports having diabetes and hypertension. Pt currently denies SI, HI, AVH.  How Long Has This Been Causing You Problems? 1-6 months  What Do You Feel Would Help You the Most Today? Alcohol or Drug Use Treatment   Have You Recently Had Any Thoughts About Hurting Yourself? No  Are You Planning to Commit Suicide/Harm Yourself At This time? No   Flowsheet Row ED from 09/25/2022 in [facility] ED from 09/23/2022 in [facility] Emergency Department at [facility] ED from 09/20/2021 in [facility] Emergency Department at [facility]  C-SSRS RISK CATEGORY No Risk No Risk No Risk       Have you Recently Had Thoughts About Hurting Someone Derek Cruz? No  Are You Planning to Harm Someone at This Time? No  Explanation: n/a   Have You Used Any Alcohol or Drugs in the  Past 24 Hours? Yes  What Did You Use and How Much? 3-4 beers   Do You Currently Have a Therapist/Psychiatrist? No  Name of Therapist/Psychiatrist: Name of  Therapist/Psychiatrist: n/a   Have You Been Recently Discharged From Any Office Practice or Programs? No  Explanation of Discharge From Practice/Program: n/a     CCA Screening Triage Referral Assessment Type of Contact: Face-to-Face  Telemedicine Service Delivery:   Is this Initial or Reassessment?   Date Telepsych consult ordered in [facility]:    Time Telepsych consult ordered in [facility]:    Location of Assessment: [facility]  Provider Location: [facility] Assessment Services   Collateral Involvement: n/a   Does Patient Have a Automotive engineer Guardian? No  Legal Guardian Contact Information: n/a  Copy of Legal Guardianship Form: -- (n/a)  Legal Guardian Notified of Arrival: -- (n/a)  Legal Guardian Notified of Pending Discharge: -- (n/a)  If Minor and Not Living with Parent(s), Who has Custody? n/a  Is CPS involved or ever been involved? Never  Is APS involved or ever been involved? Never   Patient Determined To Be At Risk for Harm To Self or Others Based on Review of Patient Reported Information or Presenting Complaint? No  Method: No Plan  Availability of Means: No access or NA  Intent: Vague intent or NA  Notification Required: No need or identified person  Additional Information for Danger to Others Potential: -- (n/a)  Additional Comments for Danger to Others Potential: n/a  Are There Guns or Other Weapons in Your Home? No  Types of Guns/Weapons: n/a  Are These Weapons Safely Secured?                            -- (n/a)  Who Could Verify You Are Able To Have These Secured: n/a  Do You Have any Outstanding Charges, Pending Court Dates, Parole/Probation? none reported  Contacted To Inform of Risk of Harm To Self or Others: Other: Comment    Does Patient Present under Involuntary Commitment? No    [place] of Residence: Derek Cruz   Patient Currently Receiving the Following Services: Not Receiving Services   Determination of Need:  Urgent (48 hours)   Options For Referral: Other: Comment; Outpatient Therapy; Medication Management; Facility-Based Crisis     CCA Biopsychosocial Patient Reported Schizophrenia/Schizoaffective Diagnosis in Past: No   Strengths: Self-awareness   Mental Health Symptoms Depression:   None   Duration of Depressive symptoms:    Mania:   None   Anxiety:    None   Psychosis:   None   Duration of Psychotic symptoms:    Trauma:   None   Obsessions:   None   Compulsions:   None   Inattention:   None   Hyperactivity/Impulsivity:   None   Oppositional/Defiant Behaviors:   None   Emotional Irregularity:   None   Other Mood/Personality Symptoms:   none    Mental Status Exam Appearance and self-care  Stature:   Average   Weight:   Average weight   Clothing:   Neat/clean   Grooming:   Normal   Cosmetic use:   None   Posture/gait:   Normal   Motor activity:   Not Remarkable   Sensorium  Attention:   Normal   Concentration:   Normal   Orientation:   X5   Recall/memory:   Normal   Affect and Mood  Affect:   Appropriate  Mood:   -- (appropriate)   Relating  Eye contact:   Normal   Facial expression:   Sad   Attitude toward examiner:   Cooperative   Thought and Language  Speech flow:  Normal   Thought content:   Appropriate to Mood and Circumstances   Preoccupation:   None   Hallucinations:   None   Organization:   Coherent   [employer] of Knowledge:   Average   Intelligence:   Average   Abstraction:   Normal   Judgement:   Normal   Reality Testing:   Adequate   Insight:   Fair   Decision Making:   Normal   Social Functioning  Social Maturity:   Responsible   Social Judgement:   Naive   Stress  Stressors:   Relationship   Coping Ability:   Human resources officer Deficits:   Self-control   Supports:   Family     Religion: Religion/Spirituality Are You A  Religious Person?: Yes How Might This Affect Treatment?: none  Leisure/Recreation: Leisure / Recreation Do You Have Hobbies?: Yes Leisure and Hobbies: drinking  Exercise/Diet: Exercise/Diet Do You Exercise?: No Have You Gained or Lost A Significant Amount of Weight in the Past Six Months?: No Do You Follow a Special Diet?: No Do You Have Any Trouble Sleeping?: Yes Explanation of Sleeping Difficulties: poor   CCA Employment/Education Employment/Work Situation: Employment / Work Situation Employment Situation: Employed Work Stressors: none Patient's Job has Been Impacted by Current Illness: No Has Patient ever Been in Equities trader?: No  Education: Education Is Patient Currently Attending School?: No Last Grade Completed: 8 ("grade school") Did Theme park manager?: No Did You Have An Individualized Education Program (IIEP): No Did You Have Any Difficulty At [employer]?: No Patient's Education Has Been Impacted by Current Illness: No   CCA Family/Childhood History Family and Relationship History: Family history Marital status: Long term relationship Long term relationship, how long?: 25 years What types of issues is patient dealing with in the relationship?: patients drinking Additional relationship information: none Does patient have children?: Yes How many children?: 3 How is patient's relationship with their children?: good  Childhood History:  Childhood History By whom was/is the patient raised?: Mother Did patient suffer any verbal/emotional/physical/sexual abuse as a child?: No Did patient suffer from severe childhood neglect?: No Has patient ever been sexually abused/assaulted/raped as an adolescent or adult?: No Was the patient ever a victim of a crime or a disaster?: No Witnessed domestic violence?: No Has patient been affected by domestic violence as an adult?: No       CCA Substance Use Alcohol/Drug Use: Alcohol / Drug Use Pain Medications: see  MAR Prescriptions: see MAR Over the Counter: see MAR History of alcohol / drug use?: Yes Longest period of sobriety (when/how long): none reported Negative Consequences of Use: Personal relationships Withdrawal Symptoms: None Substance #1 Name of Substance 1: alcohol 1 - Age of First Use: unknown 1 - Amount (size/oz): 5 beers 1 - Frequency: daily 1 - Last Use / Amount: today                       ASAM's:  Six Dimensions of Multidimensional Assessment  Dimension 1:  Acute Intoxication and/or Withdrawal Potential:   Dimension 1:  Description of individual's past and current experiences of substance use and withdrawal: Continued usage  Dimension 2:  Biomedical Conditions and Complications:   Dimension 2:  Description of patient's  biomedical conditions and  complications: None reported  Dimension 3:  Emotional, Behavioral, or Cognitive Conditions and Complications:  Dimension 3:  Description of emotional, behavioral, or cognitive conditions and complications: Sadness, marital problems.  Dimension 4:  Readiness to Change:  Dimension 4:  Description of Readiness to Change criteria: Patient motivated for treatment.  Dimension 5:  Relapse, Continued use, or Continued Problem Potential:  Dimension 5:  Relapse, continued use, or continued problem potential critiera description: Continued usage.  Dimension 6:  Recovery/Living Environment:  Dimension 6:  Recovery/Iiving environment criteria description: Family is supportive of patients treatment.  ASAM Severity Score: ASAM's Severity Rating Score: 6  ASAM Recommended Level of Treatment: ASAM Recommended Level of Treatment: Level II Partial Hospitalization Treatment   Substance use Disorder (SUD) Substance Use Disorder (SUD)  Checklist Symptoms of Substance Use: Social, occupational, recreational activities given up or reduced due to use, Persistent desire or unsuccessful efforts to cut down or control use, Presence of craving or strong  urge to use, Evidence of tolerance, Continued use despite persistent or recurrent social, interpersonal problems, caused or exacerbated by use  Recommendations for Services/Supports/Treatments: Recommendations for Services/Supports/Treatments Recommendations For Services/Supports/Treatments: Facility Based Crisis  Discharge Disposition:    DSM5 Diagnoses: Patient Active Problem List   Diagnosis Date Noted   Elevated liver enzymes    Alcohol abuse    Liver failure (HCC) 05/07/2020   Alcoholic hepatitis    Hyponatremia    Polycythemia    Hemoglobin A1c less than 7.0% 07/16/2019   Neck pain 03/31/2019   Muscle strain 03/31/2019   Undifferentiated abdominal pain 03/31/2019   Gastroesophageal reflux disease without esophagitis 11/24/2018   Chest discomfort 11/24/2018   Essential hypertension 11/24/2018   Pre-diabetes 11/24/2018   Hyperlipidemia 10/06/2016   Chest pain 09/26/2016   Diabetes mellitus type 2, controlled (HCC) 09/26/2016     Referrals to Alternative Service(s): Referred to Alternative Service(s):   Place:   Date:   Time:    Referred to Alternative Service(s):   Place:   Date:   Time:    Referred to Alternative Service(s):   Place:   Date:   Time:    Referred to Alternative Service(s):   Place:   Date:   Time:     Burnetta Sabin, [facility]

## 2022-09-25 NOTE — Progress Notes (Signed)
   09/25/22 1921  [facility] Triage Screening (Walk-ins at [facility] only)  How Did You Hear About [name]? Family/Friend  What Is the Reason for Your Visit/Call Today? Pt presents to [facility] voluntarily, accompanied by his son requesting treatment for substance abuse. Pt reports drinking 3-4 beers around 4pm today because he feels really bad when he does not drink. Pt reports feeling shaky, chest pain and nausea.He reports drinking intermittently most of his adult life, but he did not start drinking heavily daily until a few months ago. He states for the past three months he has been drinking around a 12 pack of beer daily. He does endorse stressors such as relationship issues with his wife, which have only worsened since he started drinking more heavily. Pt reports having diabetes and hypertension. Pt currently denies SI, HI, AVH.  How Long Has This Been Causing You Problems? 1-6 months  Have You Recently Had Any Thoughts About Hurting Yourself? No  Are You Planning to Commit Suicide/Harm Yourself At This time? No  Have you Recently Had Thoughts About Hurting Someone Karolee Ohs? No  Are You Planning To Harm Someone At This Time? No  Are you currently experiencing any auditory, visual or other hallucinations? No  Have You Used Any Alcohol or Drugs in the Past 24 Hours? Yes  How long ago did you use Drugs or Alcohol? this evening around 4pm  What Did You Use and How Much? 3-4 beers  Clinician description of patient physical appearance/behavior: Pt is calm, cooperative  What Do You Feel Would Help You the Most Today? Alcohol or Drug Use Treatment  If access to [facility] was not available, would you have sought care in the Emergency Department? No  Determination of Need Urgent (48 hours)  Options For Referral Other: Comment;Outpatient Therapy;Medication Management;Facility-Based Crisis

## 2022-09-25 NOTE — ED Provider Notes (Signed)
Facility Based Crisis Admission H&P  Date: 09/26/22 Patient Name: Derek Cruz MRN: [id number] Chief Complaint: I'm been drinking too much  Diagnoses:  Final diagnoses:  Alcohol abuse  Anxious appearance    HPI: Vivien Stephan,  57 y/o male with a history of alcohol abuse presented to [facility] voluntarily accompanied by his son.  Patient is of Hispanic [place] originally but do speak [place] and understand [place].  Per the patient he has been drinking and this is causing friction between him and his common law wife and she threatened to leave.  According to patient he has been drinking for the past 3 months basically every day last time drinking was today he had 4 beers.  Patient is currently not seeing a therapist or psychiatrist.  According to patient he is only taking medication for diabetes and for hypertension.  Patient lives with his common law wife/son/2 daughters.   Face-to-face observation of patient, patient is alert and oriented x 4, speech is clear, maintaining eye contact.  Does seek English well enough to answer all questions appropriately.  Patient denies SI, HI, AVH or paranoia at this time.  Patient reports he drinks alcohol daily last time he had 4 beers today.  Patient stated he is looking help for his alcohol addiction.  At this time patient does not seem to be influenced by external or internal stimuli.  Discussed with patient that for him to do detox it would be a couple of days and depending on continuous assessment it might take a little longer patient in agreement and staying in agreement with the plan of care.   Recommend inpatient [facility]  PHQ 2-9:  Flowsheet Row Office Visit from 09/14/2020 in [facility] at [facility]  Thoughts that you would be better off dead, or of hurting yourself in some way Not at all  PHQ-9 Total Score 0       Flowsheet Row ED from 09/25/2022 in [facility] ED from 09/23/2022 in [facility] Emergency Department at [facility] ED from 09/20/2021 in [facility] Emergency Department at [facility]  C-SSRS RISK CATEGORY No Risk No Risk No Risk       Screenings    Flowsheet Row Most Recent Value  CIWA-Ar Total 15       Total Time spent with patient: 30 minutes  Musculoskeletal  Strength & Muscle Tone: within normal limits Gait & Station: normal Patient leans: N/A  Psychiatric Specialty Exam  Presentation General Appearance:  Casual  Eye Contact: Good  Speech: Clear and Coherent  Speech Volume: Normal  Handedness: Right   Mood and Affect  Mood: Anxious  Affect: Appropriate   Thought Process  Thought Processes: Coherent  Descriptions of Associations:Circumstantial  Orientation:Full (Time, Place and Person)  Thought Content:Logical    Hallucinations:Hallucinations: None  Ideas of Reference:None  Suicidal Thoughts:Suicidal Thoughts: No  Homicidal Thoughts:Homicidal Thoughts: No   Sensorium  Memory: Immediate Good  Judgment: Fair  Insight: Fair   Art therapist  Concentration: Good  Attention Span: Good  Recall: Good  Fund of Knowledge: Good  Language: Fair   Psychomotor Activity  Psychomotor Activity: Psychomotor Activity: Normal   Assets  Assets: Desire for Improvement; Resilience   Sleep  Sleep: Sleep: Poor Number of Hours of Sleep: 4   Nutritional Assessment (For OBS and FBC admissions only) Has the patient had a weight loss or gain of 10 pounds or more in the last 3 months?: No Has the  patient had a decrease in food intake/or appetite?: No Does the patient have dental problems?: No Does the patient have eating habits or behaviors that may be indicators of an eating disorder including binging or inducing vomiting?: No Has the patient recently lost weight without trying?: 0 Has the patient been eating poorly because of a decreased appetite?: 0 Malnutrition Screening  Tool Score: 0    Physical Exam HENT:     Head: Normocephalic.     Nose: Nose normal.  Cardiovascular:     Rate and Rhythm: Tachycardia present.  Pulmonary:     Effort: Pulmonary effort is normal.  Musculoskeletal:        General: Normal range of motion.     Cervical back: Normal range of motion.  Neurological:     General: No focal deficit present.     Mental Status: He is alert.  Psychiatric:        Mood and Affect: Mood normal.        Behavior: Behavior normal.        Thought Content: Thought content normal.        Judgment: Judgment normal.    Review of Systems  Constitutional: Negative.   HENT: Negative.    Eyes: Negative.   Respiratory: Negative.    Cardiovascular: Negative.   Gastrointestinal: Negative.   Genitourinary: Negative.   Musculoskeletal: Negative.   Skin: Negative.   Neurological: Negative.   Psychiatric/Behavioral:  Positive for substance abuse. The patient is nervous/anxious.     Blood pressure 115/80, pulse (!) 110, temperature 98.3 F (36.8 C), temperature source Oral, resp. rate 18, SpO2 96 %. There is no height or weight on file to calculate BMI.  Past Psychiatric History: Alcoholism  Is the patient at risk to self? No  Has the patient been a risk to self in the past 6 months? No .    Has the patient been a risk to self within the distant past? No   Is the patient a risk to others? No   Has the patient been a risk to others in the past 6 months? No   Has the patient been a risk to others within the distant past? No   Past Medical History: See chart Family History: Unknown Social History: Alcohol  Last Labs:  Admission on 09/25/2022  Component Date Value Ref Range Status   WBC 09/25/2022 5.2  4.0 - 10.5 K/uL Final   RBC 09/25/2022 5.20  4.22 - 5.81 MIL/uL Final   Hemoglobin 09/25/2022 16.9  13.0 - 17.0 g/dL Final   HCT 11/91/4782 46.0  39.0 - 52.0 % Final   MCV 09/25/2022 88.5  80.0 - 100.0 fL Final   MCH 09/25/2022 32.5  26.0 -  34.0 pg Final   MCHC 09/25/2022 36.7 (H)  30.0 - 36.0 g/dL Final   RDW 95/62/1308 13.2  11.5 - 15.5 % Final   Platelets 09/25/2022 146 (L)  150 - 400 K/uL Final   nRBC 09/25/2022 0.0  0.0 - 0.2 % Final   Neutrophils Relative % 09/25/2022 55  % Final   Neutro Abs 09/25/2022 2.8  1.7 - 7.7 K/uL Final   Lymphocytes Relative 09/25/2022 32  % Final   Lymphs Abs 09/25/2022 1.7  0.7 - 4.0 K/uL Final   Monocytes Relative 09/25/2022 11  % Final   Monocytes Absolute 09/25/2022 0.6  0.1 - 1.0 K/uL Final   Eosinophils Relative 09/25/2022 1  % Final   Eosinophils Absolute 09/25/2022 0.1  0.0 - 0.5  K/uL Final   Basophils Relative 09/25/2022 1  % Final   Basophils Absolute 09/25/2022 0.0  0.0 - 0.1 K/uL Final   Immature Granulocytes 09/25/2022 0  % Final   Abs Immature Granulocytes 09/25/2022 0.02  0.00 - 0.07 K/uL Final   Performed at [facility] Lab, [address], [place], [place] [address]   Sodium 09/25/2022 133 (L)  135 - 145 mmol/L Final   Potassium 09/25/2022 3.5  3.5 - 5.1 mmol/L Final   Chloride 09/25/2022 100  98 - 111 mmol/L Final   CO2 09/25/2022 17 (L)  22 - 32 mmol/L Final   Glucose, Bld 09/25/2022 251 (H)  70 - 99 mg/dL Final   Glucose reference range applies only to samples taken after fasting for at least 8 hours.   BUN 09/25/2022 <5 (L)  6 - 20 mg/dL Final   Creatinine, Ser 09/25/2022 0.64  0.61 - 1.24 mg/dL Final   Calcium 47/82/9562 9.7  8.9 - 10.3 mg/dL Final   Total Protein 13/12/6576 7.4  6.5 - 8.1 g/dL Final   Albumin 46/96/2952 4.2  3.5 - 5.0 g/dL Final   AST 84/13/2440 111 (H)  15 - 41 U/L Final   ALT 09/25/2022 172 (H)  0 - 44 U/L Final   Alkaline Phosphatase 09/25/2022 132 (H)  38 - 126 U/L Final   Total Bilirubin 09/25/2022 0.5  0.3 - 1.2 mg/dL Final   GFR, Estimated 09/25/2022 >60  >60 mL/min Final   Comment: (NOTE) Calculated using the CKD-EPI Creatinine Equation (2021)    Anion gap 09/25/2022 16 (H)  5 - 15 Final   Performed at [facility], [address] [place], [place] [address]   Hgb A1c MFr Bld 09/25/2022 10.8 (H)  4.8 - 5.6 % Final   Comment: (NOTE) Pre diabetes:          5.7%-6.4%  Diabetes:              >6.4%  Glycemic control for   <7.0% adults with diabetes    Mean Plasma Glucose 09/25/2022 263.26  mg/dL Final   Performed at [facility] Lab, [address] [place], [place] [address]   Alcohol, Ethyl (B) 09/25/2022 290 (H)  <10 mg/dL Final   Comment: (NOTE) Lowest detectable limit for serum alcohol is 10 mg/dL.  For medical purposes only. Performed at [facility], [address], [place], [place] [address]    POC Amphetamine UR 09/26/2022 None Detected  NONE DETECTED (Cut Off Level 1000 ng/mL) Final   POC Secobarbital (BAR) 09/26/2022 None Detected  NONE DETECTED (Cut Off Level 300 ng/mL) Final   POC Buprenorphine (BUP) 09/26/2022 None Detected  NONE DETECTED (Cut Off Level 10 ng/mL) Final   POC Oxazepam (BZO) 09/26/2022 Positive (A)  NONE DETECTED (Cut Off Level 300 ng/mL) Final   POC Cocaine UR 09/26/2022 None Detected  NONE DETECTED (Cut Off Level 300 ng/mL) Final   POC Methamphetamine UR 09/26/2022 None Detected  NONE DETECTED (Cut Off Level 1000 ng/mL) Final   POC Morphine 09/26/2022 None Detected  NONE DETECTED (Cut Off Level 300 ng/mL) Final   POC Methadone UR 09/26/2022 None Detected  NONE DETECTED (Cut Off Level 300 ng/mL) Final   POC Oxycodone UR 09/26/2022 None Detected  NONE DETECTED (Cut Off Level 100 ng/mL) Final   POC Marijuana UR 09/26/2022 None Detected  NONE DETECTED (Cut Off Level 50 ng/mL) Final   Cholesterol 09/25/2022 192  0 - 200 mg/dL Final  Triglycerides 09/25/2022 169 (H)  <150 mg/dL Final   HDL 40/98/1191 98  >40 mg/dL Final   Total CHOL/HDL Ratio 09/25/2022 2.0  RATIO Final   VLDL 09/25/2022 34  0 - 40 mg/dL Final   LDL Cholesterol 09/25/2022 60  0 - 99 mg/dL Final   Comment:        Total Cholesterol/HDL:CHD Risk Coronary Heart Disease Risk Table                     Men    Women  1/2 Average Risk   3.4   3.3  Average Risk       5.0   4.4  2 X Average Risk   9.6   7.1  3 X Average Risk  23.4   11.0        Use the calculated Patient Ratio above and the CHD Risk Table to determine the patient's CHD Risk.        ATP III CLASSIFICATION (LDL):  <100     mg/dL   Optimal  478-295  mg/dL   Near or Above                    Optimal  130-159  mg/dL   Borderline  621-308  mg/dL   High  >657     mg/dL   Very High Performed at [facility], [address] [place], [place] [address]    TSH 09/25/2022 1.526  0.350 - 4.500 uIU/mL Final   Comment: Performed by a 3rd Generation assay with a functional sensitivity of <=0.01 uIU/mL. Performed at [facility], [address], [place], [place] [address]   Admission on 09/23/2022, Discharged on 09/24/2022  Component Date Value Ref Range Status   Sodium 09/23/2022 134 (L)  135 - 145 mmol/L Final   Potassium 09/23/2022 3.6  3.5 - 5.1 mmol/L Final   Chloride 09/23/2022 96 (L)  98 - 111 mmol/L Final   CO2 09/23/2022 19 (L)  22 - 32 mmol/L Final   Glucose, Bld 09/23/2022 315 (H)  70 - 99 mg/dL Final   Glucose reference range applies only to samples taken after fasting for at least 8 hours.   BUN 09/23/2022 <5 (L)  6 - 20 mg/dL Final   Creatinine, Ser 09/23/2022 0.63  0.61 - 1.24 mg/dL Final   Calcium 41/32/4401 9.1  8.9 - 10.3 mg/dL Final   GFR, Estimated 09/23/2022 >60  >60 mL/min Final   Comment: (NOTE) Calculated using the CKD-EPI Creatinine Equation (2021)    Anion gap 09/23/2022 19 (H)  5 - 15 Final   Performed at [facility], [address] [place], [place] [address]   WBC 09/23/2022 3.9 (L)  4.0 - 10.5 K/uL Final   RBC 09/23/2022 5.19  4.22 - 5.81 MIL/uL Final   Hemoglobin 09/23/2022 16.8  13.0 - 17.0 g/dL Final   HCT 36/64/4034 46.3  39.0 - 52.0 % Final   MCV 09/23/2022 89.2  80.0 - 100.0 fL Final   MCH 09/23/2022 32.4  26.0 - 34.0 pg Final   MCHC 09/23/2022 36.3 (H)  30.0 - 36.0 g/dL Final    CORRECTED FOR COLD AGGLUTININS   RDW 09/23/2022 13.6  11.5 - 15.5 % Final   Platelets 09/23/2022 164  150 - 400 K/uL Final   REPEATED TO VERIFY   nRBC 09/23/2022 0.0  0.0 - 0.2 % Final   Performed at [facility], [address] [place], [place] [address]   Troponin I (High Sensitivity) 09/23/2022 11  <18 ng/L Final   Comment: (NOTE) Elevated high sensitivity troponin I (hsTnI) values and significant  changes across serial measurements may suggest ACS but many other  chronic and acute conditions are known to elevate hsTnI results.  Refer to the "Links" section for chest pain algorithms and additional  guidance. Performed at [facility], [address], [place], [place] [address]    Troponin I (High Sensitivity) 09/23/2022 8  <18 ng/L Final   Comment: (NOTE) Elevated high sensitivity troponin I (hsTnI) values and significant  changes across serial measurements may suggest ACS but many other  chronic and acute conditions are known to elevate hsTnI results.  Refer to the "Links" section for chest pain algorithms and additional  guidance. Performed at [facility], [address] [place], [place] [address]    Total Protein 09/23/2022 7.8  6.5 - 8.1 g/dL Final   Albumin 78/29/5621 4.1  3.5 - 5.0 g/dL Final   AST 30/86/5784 127 (H)  15 - 41 U/L Final   ALT 09/23/2022 152 (H)  0 - 44 U/L Final   Alkaline Phosphatase 09/23/2022 111  38 - 126 U/L Final   Total Bilirubin 09/23/2022 0.6  0.3 - 1.2 mg/dL Final   Bilirubin, Direct 09/23/2022 0.2  0.0 - 0.2 mg/dL Final   Indirect Bilirubin 09/23/2022 0.4  0.3 - 0.9 mg/dL Final   Performed at [facility], [address], [place], [place] [address]   Alcohol, Ethyl (B) 09/23/2022 207 (H)  <10 mg/dL Final   Comment: (NOTE) Lowest detectable limit for serum alcohol is 10 mg/dL.  For medical purposes only. Performed at [facility], [address] [place], [place] [address]    Acetaminophen (Tylenol),  Serum 09/23/2022 <10 (L)  10 - 30 ug/mL Final   Comment: (NOTE) Therapeutic concentrations vary significantly. A range of 10-30 ug/mL  may be an effective concentration for many patients. However, some  are best treated at concentrations outside of this range. Acetaminophen concentrations >150 ug/mL at 4 hours after ingestion  and >50 ug/mL at 12 hours after ingestion are often associated with  toxic reactions.  Performed at [facility], [address] [place], [place] [address]    Salicylate Lvl 09/23/2022 <7.0 (L)  7.0 - 30.0 mg/dL Final   Performed at [facility], [address], [place], [place] [address]   Opiates 09/23/2022 NONE DETECTED  NONE DETECTED Final   Cocaine 09/23/2022 NONE DETECTED  NONE DETECTED Final   Benzodiazepines 09/23/2022 NONE DETECTED  NONE DETECTED Final   Amphetamines 09/23/2022 NONE DETECTED  NONE DETECTED Final   Tetrahydrocannabinol 09/23/2022 NONE DETECTED  NONE DETECTED Final   Barbiturates 09/23/2022 NONE DETECTED  NONE DETECTED Final   Comment: (NOTE) DRUG SCREEN FOR MEDICAL PURPOSES ONLY.  IF CONFIRMATION IS NEEDED FOR ANY PURPOSE, NOTIFY LAB WITHIN 5 DAYS.  LOWEST DETECTABLE LIMITS FOR URINE DRUG SCREEN Drug Class                     Cutoff (ng/mL) Amphetamine and metabolites    1000 Barbiturate and metabolites    200 Benzodiazepine                 200 Opiates and metabolites        300 Cocaine and metabolites        300 THC  50 Performed at [facility], [address] [place], [place] [address]    Color, Urine 09/23/2022 YELLOW  YELLOW Final   APPearance 09/23/2022 CLEAR  CLEAR Final   Specific Gravity, Urine 09/23/2022 1.033 (H)  1.005 - 1.030 Final   pH 09/23/2022 6.0  5.0 - 8.0 Final   Glucose, UA 09/23/2022 >=500 (A)  NEGATIVE mg/dL Final   Hgb urine dipstick 09/23/2022 MODERATE (A)  NEGATIVE Final   Bilirubin Urine 09/23/2022 NEGATIVE  NEGATIVE Final   Ketones, ur 09/23/2022 5 (A)   NEGATIVE mg/dL Final   Protein, ur 28/41/3244 100 (A)  NEGATIVE mg/dL Final   Nitrite 05/23/7251 NEGATIVE  NEGATIVE Final   Leukocytes,Ua 09/23/2022 NEGATIVE  NEGATIVE Final   RBC / HPF 09/23/2022 0-5  0 - 5 RBC/hpf Final   WBC, UA 09/23/2022 0-5  0 - 5 WBC/hpf Final   Bacteria, UA 09/23/2022 NONE SEEN  NONE SEEN Final   Squamous Epithelial / HPF 09/23/2022 0-5  0 - 5 /HPF Final   Mucus 09/23/2022 PRESENT   Final   Performed at [facility] Lab, [address], [place], [place] [address]   Lipase 09/23/2022 41  11 - 51 U/L Final   Performed at [facility], [address] [place], [place] [address]   pH, Ven 09/23/2022 7.463 (H)  7.25 - 7.43 Final   pCO2, Ven 09/23/2022 32.1 (L)  44 - 60 mmHg Final   pO2, Ven 09/23/2022 68 (H)  32 - 45 mmHg Final   Bicarbonate 09/23/2022 23.0  20.0 - 28.0 mmol/L Final   TCO2 09/23/2022 24  22 - 32 mmol/L Final   O2 Saturation 09/23/2022 95  % Final   Acid-Base Excess 09/23/2022 0.0  0.0 - 2.0 mmol/L Final   Sodium 09/23/2022 134 (L)  135 - 145 mmol/L Final   Potassium 09/23/2022 3.6  3.5 - 5.1 mmol/L Final   Calcium, Ion 09/23/2022 1.06 (L)  1.15 - 1.40 mmol/L Final   HCT 09/23/2022 40.0  39.0 - 52.0 % Final   Hemoglobin 09/23/2022 13.6  13.0 - 17.0 g/dL Final   Sample type 59/56/3875 VENOUS   Final   Glucose-Capillary 09/23/2022 276 (H)  70 - 99 mg/dL Final   Glucose reference range applies only to samples taken after fasting for at least 8 hours.   Comment 1 09/23/2022 Notify RN   Final   pH, Ven 09/23/2022 7.480 (H)  7.25 - 7.43 Final   pCO2, Ven 09/23/2022 34.3 (L)  44 - 60 mmHg Final   pO2, Ven 09/23/2022 53 (H)  32 - 45 mmHg Final   Bicarbonate 09/23/2022 25.5  20.0 - 28.0 mmol/L Final   TCO2 09/23/2022 27  22 - 32 mmol/L Final   O2 Saturation 09/23/2022 90  % Final   Acid-Base Excess 09/23/2022 2.0  0.0 - 2.0 mmol/L Final   Sodium 09/23/2022 133 (L)  135 - 145 mmol/L Final   Potassium 09/23/2022 3.5  3.5 - 5.1 mmol/L Final    Calcium, Ion 09/23/2022 1.14 (L)  1.15 - 1.40 mmol/L Final   HCT 09/23/2022 42.0  39.0 - 52.0 % Final   Hemoglobin 09/23/2022 14.3  13.0 - 17.0 g/dL Final   Sample type 64/33/2951 VENOUS   Final   Glucose-Capillary 09/24/2022 239 (H)  70 - 99 mg/dL Final   Glucose reference range applies only to samples taken after fasting for at least 8 hours.   Comment 1 09/24/2022 Notify RN   Final  Comment 2 09/24/2022 Document in Chart   Final  Office Visit on 05/01/2022  Component Date Value Ref Range Status   Glucose 05/01/2022 275 (H)  70 - 99 mg/dL Final   BUN 52/84/1324 12  6 - 24 mg/dL Final   Creatinine, Ser 05/01/2022 0.66 (L)  0.76 - 1.27 mg/dL Final   eGFR 40/02/2724 110  >59 mL/min/1.73 Final   BUN/Creatinine Ratio 05/01/2022 18  9 - 20 Final   Sodium 05/01/2022 137  134 - 144 mmol/L Final   Potassium 05/01/2022 4.0  3.5 - 5.2 mmol/L Final   Chloride 05/01/2022 98  96 - 106 mmol/L Final   CO2 05/01/2022 21  20 - 29 mmol/L Final   Calcium 05/01/2022 9.6  8.7 - 10.2 mg/dL Final   Hgb D6U MFr Bld 05/01/2022 7.1 (H)  4.8 - 5.6 % Final   Comment:          Prediabetes: 5.7 - 6.4          Diabetes: >6.4          Glycemic control for adults with diabetes: <7.0    Est. average glucose Bld gHb Est-m* 05/01/2022 157  mg/dL Final    Allergies: Patient has no known allergies.  Medications:  Facility Ordered Medications  Medication   acetaminophen (TYLENOL) tablet 650 mg   alum & mag hydroxide-simeth (MAALOX/MYLANTA) 200-200-20 MG/5ML suspension 30 mL   magnesium hydroxide (MILK OF MAGNESIA) suspension 30 mL   [COMPLETED] thiamine (VITAMIN B1) injection 100 mg   thiamine (VITAMIN B1) tablet 100 mg   multivitamin with minerals tablet 1 tablet   LORazepam (ATIVAN) tablet 1 mg   hydrOXYzine (ATARAX) tablet 25 mg   loperamide (IMODIUM) capsule 2-4 mg   ondansetron (ZOFRAN-ODT) disintegrating tablet 4 mg   LORazepam (ATIVAN) tablet 1 mg   Followed by   LORazepam (ATIVAN) tablet 1 mg    Followed by   Melene Muller ON 09/27/2022] LORazepam (ATIVAN) tablet 1 mg   Followed by   Melene Muller ON 09/29/2022] LORazepam (ATIVAN) tablet 1 mg   OLANZapine zydis (ZYPREXA) disintegrating tablet 5 mg   And   ziprasidone (GEODON) injection 20 mg   metFORMIN (GLUCOPHAGE-XR) 24 hr tablet 1,000 mg   lisinopril (ZESTRIL) tablet 20 mg   atorvastatin (LIPITOR) tablet 40 mg   PTA Medications  Medication Sig   thiamine 100 MG tablet Take 1 tablet (100 mg total) by mouth daily. (Patient not taking: Reported on 09/20/2021)   ondansetron (ZOFRAN) 4 MG tablet Take 1 tablet (4 mg total) by mouth every 6 (six) hours.    Long Term Goals: Improvement in symptoms so as ready for discharge  Short Term Goals: Patient will verbalize feelings in meetings with treatment team members., Patient will attend at least of 50% of the groups daily., Pt will complete the PHQ9 on admission, day 3 and discharge., Patient will participate in completing the [place] Suicide Severity Rating Scale, Patient will score a low risk of violence for 24 hours prior to discharge, and Patient will take medications as prescribed daily.  Medical Decision Making  Inpatient FBC  Lab Orders         CBC with Differential/Platelet         Comprehensive metabolic panel         Hemoglobin A1c         Ethanol         Lipid panel         TSH  POCT Urine Drug Screen - (I-Screen)      Meds ordered this encounter  Medications   acetaminophen (TYLENOL) tablet 650 mg   alum & mag hydroxide-simeth (MAALOX/MYLANTA) 200-200-20 MG/5ML suspension 30 mL   magnesium hydroxide (MILK OF MAGNESIA) suspension 30 mL   thiamine (VITAMIN B1) injection 100 mg   thiamine (VITAMIN B1) tablet 100 mg   multivitamin with minerals tablet 1 tablet   LORazepam (ATIVAN) tablet 1 mg   hydrOXYzine (ATARAX) tablet 25 mg   loperamide (IMODIUM) capsule 2-4 mg   ondansetron (ZOFRAN-ODT) disintegrating tablet 4 mg   FOLLOWED BY Linked Order Group    LORazepam (ATIVAN)  tablet 1 mg    LORazepam (ATIVAN) tablet 1 mg    LORazepam (ATIVAN) tablet 1 mg    LORazepam (ATIVAN) tablet 1 mg   AND Linked Order Group    OLANZapine zydis (ZYPREXA) disintegrating tablet 5 mg    ziprasidone (GEODON) injection 20 mg   metFORMIN (GLUCOPHAGE-XR) 24 hr tablet 1,000 mg   lisinopril (ZESTRIL) tablet 20 mg   atorvastatin (LIPITOR) tablet 40 mg     Recommendations  Based on my evaluation the patient appears to have an emergency medical condition for which I recommend the patient be transferred to the emergency department for further evaluation.  [name], NP 09/26/22  5:51 AM

## 2022-09-26 ENCOUNTER — Other Ambulatory Visit: Payer: Self-pay

## 2022-09-26 ENCOUNTER — Encounter (HOSPITAL_COMMUNITY): Payer: Self-pay | Admitting: Emergency Medicine

## 2022-09-26 ENCOUNTER — Emergency Department (HOSPITAL_COMMUNITY)
Admission: EM | Admit: 2022-09-26 | Discharge: 2022-09-27 | Disposition: A | Discharge status: Home / Self Care | Payer: Self-pay | Attending: Emergency Medicine | Admitting: Emergency Medicine

## 2022-09-26 DIAGNOSIS — Z7984 Long term (current) use of oral hypoglycemic drugs: Secondary | ICD-10-CM | POA: Insufficient documentation

## 2022-09-26 DIAGNOSIS — E119 Type 2 diabetes mellitus without complications: Secondary | ICD-10-CM | POA: Diagnosis not present

## 2022-09-26 DIAGNOSIS — Z79899 Other long term (current) drug therapy: Secondary | ICD-10-CM | POA: Insufficient documentation

## 2022-09-26 DIAGNOSIS — F1010 Alcohol abuse, uncomplicated: Secondary | ICD-10-CM | POA: Diagnosis not present

## 2022-09-26 DIAGNOSIS — K7010 Alcoholic hepatitis without ascites: Secondary | ICD-10-CM | POA: Diagnosis not present

## 2022-09-26 DIAGNOSIS — K7290 Hepatic failure, unspecified without coma: Secondary | ICD-10-CM | POA: Diagnosis not present

## 2022-09-26 DIAGNOSIS — F1020 Alcohol dependence, uncomplicated: Secondary | ICD-10-CM | POA: Diagnosis not present

## 2022-09-26 DIAGNOSIS — R739 Hyperglycemia, unspecified: Secondary | ICD-10-CM

## 2022-09-26 DIAGNOSIS — E1165 Type 2 diabetes mellitus with hyperglycemia: Secondary | ICD-10-CM | POA: Insufficient documentation

## 2022-09-26 DIAGNOSIS — R45851 Suicidal ideations: Secondary | ICD-10-CM | POA: Insufficient documentation

## 2022-09-26 LAB — POCT URINE DRUG SCREEN - MANUAL ENTRY (I-SCREEN)
POC Amphetamine UR: NOT DETECTED
POC Buprenorphine (BUP): NOT DETECTED
POC Cocaine UR: NOT DETECTED
POC Marijuana UR: NOT DETECTED
POC Methadone UR: NOT DETECTED
POC Methamphetamine UR: NOT DETECTED
POC Morphine: NOT DETECTED
POC Oxazepam (BZO): POSITIVE — AB
POC Oxycodone UR: NOT DETECTED
POC Secobarbital (BAR): NOT DETECTED

## 2022-09-26 LAB — COMPREHENSIVE METABOLIC PANEL
ALT: 185 U/L — ABNORMAL HIGH (ref 0–44)
ALT: 172 U/L — ABNORMAL HIGH (ref 0–44)
AST: 161 U/L — ABNORMAL HIGH (ref 15–41)
AST: 112 U/L — ABNORMAL HIGH (ref 15–41)
Albumin: 3.9 g/dL (ref 3.5–5.0)
Albumin: 3.8 g/dL (ref 3.5–5.0)
Alkaline Phosphatase: 103 U/L (ref 38–126)
Alkaline Phosphatase: 162 U/L — ABNORMAL HIGH (ref 38–126)
Anion gap: 13 (ref 5–15)
Anion gap: 15 (ref 5–15)
BUN: 10 mg/dL (ref 6–20)
BUN: 18 mg/dL (ref 6–20)
CO2: 20 mmol/L — ABNORMAL LOW (ref 22–32)
CO2: 18 mmol/L — ABNORMAL LOW (ref 22–32)
Calcium: 9.5 mg/dL (ref 8.9–10.3)
Calcium: 9.9 mg/dL (ref 8.9–10.3)
Chloride: 102 mmol/L (ref 98–111)
Chloride: 98 mmol/L (ref 98–111)
Creatinine, Ser: 0.71 mg/dL (ref 0.61–1.24)
Creatinine, Ser: 0.88 mg/dL (ref 0.61–1.24)
GFR, Estimated: >60 mL/min (ref >60)
GFR, Estimated: >60 mL/min (ref >60)
Glucose, Bld: 298 mg/dL — ABNORMAL HIGH (ref 70–99)
Glucose, Bld: 296 mg/dL — ABNORMAL HIGH (ref 70–99)
Potassium: 3.6 mmol/L (ref 3.5–5.1)
Potassium: 4.2 mmol/L (ref 3.5–5.1)
Sodium: 135 mmol/L (ref 135–145)
Sodium: 131 mmol/L — ABNORMAL LOW (ref 135–145)
Total Bilirubin: 0.8 mg/dL (ref 0.3–1.2)
Total Bilirubin: 0.6 mg/dL (ref 0.3–1.2)
Total Protein: 7.3 g/dL (ref 6.5–8.1)
Total Protein: 7.1 g/dL (ref 6.5–8.1)

## 2022-09-26 LAB — URINALYSIS, ROUTINE W REFLEX MICROSCOPIC
Bilirubin Urine: NEGATIVE
Glucose, UA: >=500 mg/dL — AB
Hgb urine dipstick: NEGATIVE
Ketones, ur: NEGATIVE mg/dL
Leukocytes,Ua: NEGATIVE
Nitrite: NEGATIVE
Protein, ur: NEGATIVE mg/dL
Specific Gravity, Urine: 1.023 (ref 1.005–1.030)
pH: 5 (ref 5.0–8.0)

## 2022-09-26 LAB — I-STAT VENOUS BLOOD GAS, ED
Acid-base deficit: 2 mmol/L (ref 0.0–2.0)
Bicarbonate: 21.3 mmol/L (ref 20.0–28.0)
Calcium, Ion: 1.19 mmol/L (ref 1.15–1.40)
HCT: 42 % (ref 39.0–52.0)
Hemoglobin: 14.3 g/dL (ref 13.0–17.0)
O2 Saturation: 97 %
Potassium: 3.6 mmol/L (ref 3.5–5.1)
Sodium: 133 mmol/L — ABNORMAL LOW (ref 135–145)
TCO2: 22 mmol/L (ref 22–32)
pCO2, Ven: 31.6 mmHg — ABNORMAL LOW (ref 44–60)
pH, Ven: 7.437 — ABNORMAL HIGH (ref 7.25–7.43)
pO2, Ven: 87 mmHg — ABNORMAL HIGH (ref 32–45)

## 2022-09-26 LAB — GLUCOSE, CAPILLARY
Glucose-Capillary: 192 mg/dL — ABNORMAL HIGH (ref 70–99)
Glucose-Capillary: 315 mg/dL — ABNORMAL HIGH (ref 70–99)
Glucose-Capillary: 387 mg/dL — ABNORMAL HIGH (ref 70–99)

## 2022-09-26 LAB — CBC
HCT: 44.4 % (ref 39.0–52.0)
Hemoglobin: 15.9 g/dL (ref 13.0–17.0)
MCH: 32.6 pg (ref 26.0–34.0)
MCHC: 35.8 g/dL (ref 30.0–36.0)
MCV: 91 fL (ref 80.0–100.0)
Platelets: 150 10*3/uL (ref 150–400)
RBC: 4.88 MIL/uL (ref 4.22–5.81)
RDW: 13.5 % (ref 11.5–15.5)
WBC: 8.1 10*3/uL (ref 4.0–10.5)
nRBC: 0 % (ref 0.0–0.2)

## 2022-09-26 LAB — RAPID URINE DRUG SCREEN, HOSP PERFORMED
Amphetamines: NOT DETECTED
Barbiturates: NOT DETECTED
Benzodiazepines: POSITIVE — AB
Cocaine: NOT DETECTED
Opiates: NOT DETECTED
Tetrahydrocannabinol: NOT DETECTED

## 2022-09-26 LAB — HEPATITIS PANEL, ACUTE
HCV Ab: NONREACTIVE
Hep A IgM: NONREACTIVE
Hep B C IgM: NONREACTIVE
Hepatitis B Surface Ag: NONREACTIVE

## 2022-09-26 LAB — CBG MONITORING, ED
Glucose-Capillary: 281 mg/dL — ABNORMAL HIGH (ref 70–99)
Glucose-Capillary: 294 mg/dL — ABNORMAL HIGH (ref 70–99)

## 2022-09-26 LAB — PROTIME-INR
INR: 1 (ref 0.8–1.2)
Prothrombin Time: 13 seconds (ref 11.4–15.2)

## 2022-09-26 LAB — ETHANOL: Alcohol, Ethyl (B): <10 mg/dL (ref <10)

## 2022-09-26 LAB — LIPASE, BLOOD: Lipase: 64 U/L — ABNORMAL HIGH (ref 11–51)

## 2022-09-26 MED ORDER — LORAZEPAM 1 MG PO TABS: 1.0000 mg | ORAL_TABLET | Freq: Two times a day (BID) | ORAL | Status: DC

## 2022-09-26 MED ORDER — LORAZEPAM 1 MG PO TABS: 1.0000 mg | ORAL_TABLET | Freq: Every day | ORAL | Status: DC

## 2022-09-26 MED ORDER — PANTOPRAZOLE SODIUM 40 MG PO TBEC
40.0000 mg | DELAYED_RELEASE_TABLET | Freq: Every day | ORAL | Status: DC
  Administered 2022-09-26: 40 mg via ORAL
  Filled 2022-09-26: qty 1

## 2022-09-26 MED ORDER — INSULIN ASPART 100 UNIT/ML IJ SOLN
0.0000 [IU] | Freq: Three times a day (TID) | INTRAMUSCULAR | Status: DC
  Administered 2022-09-26: 7 [IU] via SUBCUTANEOUS
  Administered 2022-09-26: 2 [IU] via SUBCUTANEOUS

## 2022-09-26 MED ORDER — LORAZEPAM 1 MG PO TABS
1.0000 mg | ORAL_TABLET | Freq: Four times a day (QID) | ORAL | Status: DC
  Administered 2022-09-26 (×3): 1 mg via ORAL
  Filled 2022-09-26 (×2): qty 1

## 2022-09-26 MED ORDER — SODIUM CHLORIDE 0.9 % IV BOLUS
1000.0000 mL | Freq: Once | INTRAVENOUS | Status: AC
  Administered 2022-09-26: 1000 mL via INTRAVENOUS

## 2022-09-26 MED ORDER — METFORMIN HCL ER 750 MG PO TB24: 1500.0000 mg | ORAL_TABLET | Freq: Every day | ORAL | Status: DC

## 2022-09-26 MED ORDER — INSULIN GLARGINE-YFGN 100 UNIT/ML ~~LOC~~ SOLN: 5.0000 [IU] | Freq: Every day | SUBCUTANEOUS | Status: DC

## 2022-09-26 MED ORDER — INSULIN ASPART 100 UNIT/ML IJ SOLN
0.0000 [IU] | Freq: Three times a day (TID) | INTRAMUSCULAR | Status: DC
  Administered 2022-09-26: 15 [IU] via SUBCUTANEOUS

## 2022-09-26 MED ORDER — ENSURE ENLIVE PO LIQD
237.0000 mL | Freq: Two times a day (BID) | ORAL | Status: DC
  Administered 2022-09-26 (×2): 237 mL via ORAL

## 2022-09-26 MED ORDER — LORAZEPAM 1 MG PO TABS: 1.0000 mg | ORAL_TABLET | Freq: Three times a day (TID) | ORAL | Status: DC

## 2022-09-26 MED ORDER — METFORMIN HCL 500 MG PO TABS
750.0000 mg | ORAL_TABLET | Freq: Two times a day (BID) | ORAL | 0 refills | Status: DC
  Filled 2022-09-26: qty 90, 30d supply, fill #0

## 2022-09-26 MED ORDER — MECLIZINE HCL 12.5 MG PO TABS
25.0000 mg | ORAL_TABLET | Freq: Three times a day (TID) | ORAL | Status: DC | PRN
  Administered 2022-09-26: 25 mg via ORAL
  Filled 2022-09-26: qty 2

## 2022-09-26 NOTE — ED Notes (Signed)
Patient denies SI,HI,AVH. Night medication administered without difficulty to patient. Patient interacted well with peers and cooperative with staff. Patient shows no signs of ETOH withdrawal. Respiratory is even and unlabored. No distress noted. Patient sleeping or resting in bed at present. Patient stated no complaints at present. Will continue to monitor for safety.

## 2022-09-26 NOTE — Group Note (Signed)
Group Topic: Communication  Group Date: 09/26/2022 Start Time: 1200 End Time: 1230 Facilitators: Merrie Roof, RN  Department: [facility]  Number of Participants: 6  Group Focus: safety plan Treatment Modality:  Patient-Centered Therapy Interventions utilized were assignment Purpose: express feelings  Name: Derek Cruz Date of Birth: 1965/12/21  MR: [id number]    Level of Participation:  Quality of Participation:  Interactions with others:  Mood/Affect:  Triggers (if applicable):  Cognition:  Progress:  Response:  Plan: Patient did not attend group due to not feeling well.  Patients Problems:  Patient Active Problem List   Diagnosis Date Noted   Elevated liver enzymes    Alcohol abuse    Liver failure (HCC) 05/07/2020   Alcoholic hepatitis    Hyponatremia    Polycythemia    Hemoglobin A1c less than 7.0% 07/16/2019   Neck pain 03/31/2019   Muscle strain 03/31/2019   Undifferentiated abdominal pain 03/31/2019   Gastroesophageal reflux disease without esophagitis 11/24/2018   Chest discomfort 11/24/2018   Essential hypertension 11/24/2018   Pre-diabetes 11/24/2018   Hyperlipidemia 10/06/2016   Chest pain 09/26/2016   Diabetes mellitus type 2, controlled (HCC) 09/26/2016

## 2022-09-26 NOTE — ED Provider Notes (Signed)
[place] EMERGENCY DEPARTMENT AT [facility] Provider Note   CSN: [id number] Arrival date & time: 09/26/22  1900     History  Chief Complaint  Patient presents with   Medical Clerance   Alcohol Problem   Suicidal    Derek Cruz is a 57 y.o. male, history of alcoholism type 2 diabetes, who presents to the ED secondary to elevated blood sugars, was brought in by EMS from [facility] due to staff concern from elevated blood sugars.  He states that he is currently in rehab, and has been at be [facility] for alcohol detox.  Has been drinking 8-10 beers per day, for the last 3 months, last drink 8 beers yesterday.  Notes that he also had suicidal plans yesterday, and plan to kill himself, by taking a knife and hurting himself.  Today he denies any kind of SI, HI, AVH.  He is not have any nausea, vomiting, abdominal pain.  He has been compliant with his metformin.   Home Medications Prior to Admission medications   Medication Sig Start Date End Date Taking? Authorizing Provider  acetaminophen (TYLENOL) 500 MG tablet Take 500 mg by mouth every 6 (six) hours as needed for headache.   Yes [provider]  atorvastatin (LIPITOR) 40 MG tablet Take 1 tablet (40 mg total) by mouth daily. 05/01/22 09/26/22 Yes Zonia Kief, Amy J, NP  lisinopril (ZESTRIL) 20 MG tablet Take 1 tablet (20 mg total) by mouth daily. 05/01/22 09/26/22 Yes Zonia Kief, Amy J, NP  metFORMIN (GLUCOPHAGE) 500 MG tablet Take 1.5 tablets (750 mg total) by mouth 2 (two) times daily with a meal. 09/26/22  Yes Jakaiya Netherland L, PA      Allergies    Patient has no known allergies.    Review of Systems   Review of Systems  Gastrointestinal:  Negative for abdominal pain, nausea and vomiting.    Physical Exam Updated Vital Signs BP 126/86 (BP Location: Right Arm)   Pulse (!) 106   Temp 98.3 F (36.8 C)   Resp 19   SpO2 100%  Physical Exam Vitals and nursing note reviewed.  Constitutional:      General: He is  not in acute distress.    Appearance: He is well-developed.  HENT:     Head: Normocephalic and atraumatic.  Eyes:     Conjunctiva/sclera: Conjunctivae normal.  Cardiovascular:     Rate and Rhythm: Normal rate and regular rhythm.     Heart sounds: No murmur heard. Pulmonary:     Effort: Pulmonary effort is normal. No respiratory distress.     Breath sounds: Normal breath sounds.  Abdominal:     Palpations: Abdomen is soft.     Tenderness: There is no abdominal tenderness.  Musculoskeletal:        General: No swelling.     Cervical back: Neck supple.  Skin:    General: Skin is warm and dry.     Capillary Refill: Capillary refill takes less than 2 seconds.  Neurological:     Mental Status: He is alert.  Psychiatric:        Mood and Affect: Mood normal.     ED Results / Procedures / Treatments   Labs (all labs ordered are listed, but only abnormal results are displayed) Labs Reviewed  COMPREHENSIVE METABOLIC PANEL - Abnormal; Notable for the following components:      Result Value   Sodium 131 (*)    CO2 18 (*)    Glucose, Bld 296 (*)  AST 112 (*)    ALT 172 (*)    Alkaline Phosphatase 162 (*)    All other components within normal limits  URINALYSIS, ROUTINE W REFLEX MICROSCOPIC - Abnormal; Notable for the following components:   Glucose, UA >=500 (*)    Bacteria, UA RARE (*)    All other components within normal limits  RAPID URINE DRUG SCREEN, HOSP PERFORMED - Abnormal; Notable for the following components:   Benzodiazepines POSITIVE (*)    All other components within normal limits  CBG MONITORING, ED - Abnormal; Notable for the following components:   Glucose-Capillary 281 (*)    All other components within normal limits  I-STAT VENOUS BLOOD GAS, ED - Abnormal; Notable for the following components:   pH, Ven 7.437 (*)    pCO2, Ven 31.6 (*)    pO2, Ven 87 (*)    Sodium 133 (*)    All other components within normal limits  CBG MONITORING, ED - Abnormal; Notable  for the following components:   Glucose-Capillary 294 (*)    All other components within normal limits  CBC  ETHANOL  BLOOD GAS, VENOUS  BLOOD GAS, VENOUS    EKG None  Radiology No results found.  Procedures Procedures    Medications Ordered in ED Medications  sodium chloride 0.9 % bolus 1,000 mL (0 mLs Intravenous Stopped 09/26/22 2221)    ED Course/ Medical Decision Making/ A&P                             Medical Decision Making Patient is a 57 year old male, here for medical evaluation, sent by the ER for hyperglycemia.  He denies any current nausea, vomiting, abdominal pain.  Will obtain labs as well as VBG.  He did have suicidal ideation yesterday, but today he denies it, upon evaluation, and review of [facility] notes, he is currently at rehab for alcohol detox.  Amount and/or Complexity of Data Reviewed Labs: ordered.    Details: Hyperglycemia, elevated transaminitis, positive for benzos, no evidence of acidosis on VBG Discussion of management or test interpretation with external provider(s): Discussed with patient, he denies any SI, HI, AVH.  He is well-appearing, and he states that he has been cleared by psychiatric, to be [facility], I reviewed his notes he is currently there for alcohol detox.  He has no plan, and is not suicidal at this time for me.  His glucose is elevated, however he is currently on metformin we will increase his metformin to 750 mg twice a day, have him follow-up with primary care doctor, he is not currently in DKA or HHS, this is reassuring, received fluids, and after eating glucose was 294.  Discussed return precautions he voiced understanding utilized interpreter.  Risk Prescription drug management.   Final Clinical Impression(s) / ED Diagnoses Final diagnoses:  Hyperglycemia    Rx / DC Orders ED Discharge Orders          Ordered    metFORMIN (GLUCOPHAGE) 500 MG tablet  2 times daily with meals        09/26/22 2328              Vici Novick,  Emmalynne Courtney Elbert Ewings, PA 09/26/22 2332    Gloris Manchester, MD 09/27/22 [phone]

## 2022-09-26 NOTE — ED Notes (Signed)
Patient had blood draw this am. States that he got light headed . Vital were taken and they were 123/72pulse 100 oxygen 96. Patient was breathing non labored . Provider is aware. Will continue to monitor for safety. Advise the patient to call for help if needed.

## 2022-09-26 NOTE — ED Notes (Signed)
Patient alert and oriented x 3. Denies SI/HI/AVH. Denies intent or plan to harm self or others. Routine conducted according to faculty protocol. Encourage patient to notify staff with any needs or concerns. Patient verbalized agreement and understanding. Will continue to monitor for safety. 

## 2022-09-26 NOTE — ED Triage Notes (Signed)
Pt BIB GEMS from [facility] d/t staff concern for elevated blood sugars. Per EMS staff request medical clearance prior to pt being able to return to facility, Pt endorses hx of T2DM with compliance with metformin.   Pt was a [facility] seeking medical care for alcohol detox. Has been drinking 8-10 beers per day for past 3 months. Last drink yesterday.  Has had suicidal thoughts with intent on acting on plan to cut self. Took knife yesterday but did not hurt himself.   Pt requires spanish speaking interpreter.

## 2022-09-26 NOTE — ED Notes (Signed)
Rn notified provider of patients blood sugar and blood pressure 129/91 pulse 104,blood sugar was 315

## 2022-09-26 NOTE — ED Notes (Signed)
Rn called 911 to take patient to hospital to be medically cleared then he can return to [facility].

## 2022-09-26 NOTE — ED Notes (Signed)
ED Provider at bedside. 

## 2022-09-26 NOTE — ED Notes (Signed)
Pt to be discharged from ED and return to [facility].  Attempted to call x2 to [facility] to give report but no answer.  Will continue to attempt calling.

## 2022-09-26 NOTE — ED Notes (Signed)
Patient did not attend group today due to not feeling well.

## 2022-09-26 NOTE — ED Notes (Addendum)
Patient in milieu. Environment is secured. Will continue to monitor for safety. 

## 2022-09-26 NOTE — ED Notes (Signed)
Provider states that the patient is going to the hospital to be medically cleared.

## 2022-09-26 NOTE — ED Notes (Signed)
Rn gave report to [employer] charge at [employer]. Aundra Millet states for the patient to go to triage.

## 2022-09-26 NOTE — Tx Team (Signed)
LCSW and MD met with patient at bedside to assess current mood, affect, physical state, and inquire about needs/goals while here in [facility] and after discharge. Spanish Interpreter was not needed. Patient reports he presented due to needing to quit alcohol before he loses his whole family. Patient reports his wife does not want anything to do with him at this time, and reports he wants to fight for his marriage. Patient reports he drinks about a 6-12 pack of beer per day. Patient reports a two year period of sobriety and reports having support from his wife helped him to remain clean. Patient reports it gradually went from 1-2 beers in a setting to 3-6 beers in one setting, and then reports it just increased from there. Patient reports he has never sought help for his addiction. Patient is being seen by a medical provider Amy Zonia Kief for primary care services. Patient reports he works for a [employer] and his plan is to return home with outpatient services in place. Patient provided permission for team to follow up with his family as needed. Sister: Wallene Huh [phone], Son: Montez Hageman [phone]. No other needs were reported at this time.   LCSW will likely schedule an outpatient appointment with the [facility] outpatient clinic. Updates will be provided as received.    Fernande Boyden, LCSW Clinical Social Worker [facility] Ph: [phone]

## 2022-09-26 NOTE — ED Provider Triage Note (Signed)
Emergency Medicine Provider Triage Evaluation Note  Derek Cruz , a 57 y.o. male  was evaluated in triage.  Pt complains of hyperglycemia, he was at behavioral health secondary to alcohol withdrawal, patient reports that he had been drinking around 8 beers per day, and had stopped yesterday.  He reports nauseousness, vomiting, chills, general malaise.  History of diabetes, alcoholic hepatitis.  He normally takes metformin only.  He had insulin several times today, blood sugar maximum of 400, 280 with EMS..  Review of Systems  Positive: Nauseous, vomiting, shaking, chills Negative: Fever, chest pain, shob  Physical Exam  There were no vitals taken for this visit. Gen:   Awake, no distress , somewhat diaphoretic Resp:  Normal effort  MSK:   Moves extremities without difficulty  Other:    Medical Decision Making  Medically screening exam initiated at 7:05 PM.  Appropriate orders placed.  Derek Cruz was informed that the remainder of the evaluation will be completed by another provider, this initial triage assessment does not replace that evaluation, and the importance of remaining in the ED until their evaluation is complete.  Workup initiated in triage    [place] 09/26/22 1905

## 2022-09-26 NOTE — ED Notes (Signed)
Rn notified provider that patients blood sugar was 387. Insulin was given.Provider states to recheck in  1 hour . Patient is not showing any s/s of hyperglycemia will continue to monitor for safety.

## 2022-09-26 NOTE — ED Notes (Signed)
Patient  resting in no acute stress. RR even and unlabored .Environment secured .Will continue to monitor for safely. 

## 2022-09-26 NOTE — ED Notes (Signed)
Pt given sandwich and sprite per request.  

## 2022-09-26 NOTE — ED Notes (Signed)
Patient admitted to [facility] endorsing ETOH abuse. Patient was cooperative during the admission assessment. Skin assessment complete. Patient has no belongings. Patient oriented to unit and unit rules. Meal and drinks offered to patient. Patient verbalized agreement to treatment plans. Patient verbally contracts for safety during hospitalization. Will continue to monitor for safety.

## 2022-09-26 NOTE — ED Notes (Signed)
Patient in milieu. Environment is secured. Will continue to monitor for safety. 

## 2022-09-26 NOTE — ED Notes (Signed)
Patient denies any dizziness at this time. 

## 2022-09-26 NOTE — ED Provider Notes (Signed)
This provider was notified by patient's nurse Delice Bison, RN that patient's CBG at 5pm was 387 and that patient received 15 units of insulin aspart per sliding scale orders. Upon recheck at 6pm, patient's CBG is now 404. Provider to unit to assess patient. Patient alert and oriented x4, ambulatory. Respirations even and unlabored. No signs of acute distress noted. Patient denies current signs/symptoms of hyperglycemia. This provider made call to [facility], spoke with Dr. Lynelle Doctor who has agreed to accept the patient for medical clearance. EMTALA completed. Nursing staff will contact EMS to transport patient to [facility]. Once medically cleared, patient may return to [facility]).

## 2022-09-26 NOTE — ED Notes (Signed)
Rn notified provider that patients blood sugar was 315

## 2022-09-26 NOTE — ED Notes (Signed)
Rn notified provider that patients blood sugar is 404. Patient is not showing any s/s of hyperglycemia  will continue to monitor for safety.

## 2022-09-26 NOTE — ED Provider Notes (Cosign Needed Addendum)
[facility] Progress Note  Date and Time: 09/26/2022 8:58 AM Name: Derek Cruz MRN:  [id number]  Reason For Admission: Derek Cruz is a 57 yo male w/ hx of type 2 diabetes, dyslipidemia, GERD, severe alcohol use disorder c/b alcoholic hepatitis and liver failure admitted to [facility] for alcohol detox.   Subjective: Patient seen and assessed at bedside.  He appears fatigued and ill-appearing.  He reports did not sleep well last night due to alcohol withdrawal symptoms including restlessness, nausea/vomiting, tremors, and general discomfort.  He also endorses that since yesterday he has had constant epigastric abdominal pain that radiates to his back. He reports poor appetite at this time.  While he denies experiencing paresthesia, he does report he has been feeling off balance for a while. He denies SI/HI/AVH. He denies recent hx of abdominal swelling or leg edema. AxOx3 (thought it was March 2024).  Patient reports having argument with wife and wife wants to leave him especially given his chronic alcohol use. He is motivated to quit so he does not lose his family.  I discussed we would draw some labs to assess for liver dysfunction and pancreatitis and he was agreeable.   Diagnosis:  Final diagnoses:  Alcoholic hepatitis  Controlled type 2 diabetes mellitus without complication, without long-term current use of insulin (HCC)  Alcoholic hepatitis, unspecified whether ascites present  Liver failure without hepatic coma, unspecified chronicity (HCC)  Alcohol abuse  Anxious appearance    Total Time spent with patient: 45 minutes   Labs  Lab Results:     Latest Ref Rng & Units 09/25/2022    8:52 PM 09/23/2022    9:44 PM 09/23/2022    4:30 PM  CBC  WBC 4.0 - 10.5 K/uL 5.2     Hemoglobin 13.0 - 17.0 g/dL 78.2  95.6  21.3   Hematocrit 39.0 - 52.0 % 46.0  42.0  40.0   Platelets 150 - 400 K/uL 146         Latest Ref Rng & Units 09/25/2022    8:52 PM 09/23/2022    9:44 PM  09/23/2022    4:30 PM  CMP  Glucose 70 - 99 mg/dL 086     BUN 6 - 20 mg/dL <5     Creatinine 5.78 - 1.24 mg/dL 4.69     Sodium 629 - 528 mmol/L 133  133  134   Potassium 3.5 - 5.1 mmol/L 3.5  3.5  3.6   Chloride 98 - 111 mmol/L 100     CO2 22 - 32 mmol/L 17     Calcium 8.9 - 10.3 mg/dL 9.7     Total Protein 6.5 - 8.1 g/dL 7.4     Total Bilirubin 0.3 - 1.2 mg/dL 0.5     Alkaline Phos 38 - 126 U/L 132     AST 15 - 41 U/L 111     ALT 0 - 44 U/L 172       Physical Findings   PHQ2-9    Flowsheet Row Office Visit from 01/15/2022 in [facility] at [facility] Office Visit from 09/11/2021 in [facility] Care at [facility] Office Visit from 06/20/2021 in [facility] Primary Care at [facility] Office Visit from 04/25/2021 in [facility] Care at [facility] Office Visit from 09/14/2020 in [facility] Primary Care at [facility] Total Score 0 0 0 0 0  PHQ-9 Total Score -- -- -- -- 0  Flowsheet Row ED from 09/25/2022 in [facility] ED from 09/23/2022 in [facility] Emergency Department at [facility] ED from 09/20/2021 in [facility] Emergency Department at [facility]  C-SSRS RISK CATEGORY No Risk No Risk No Risk        Musculoskeletal  Strength & Muscle Tone: within normal limits Gait & Station: normal Patient leans: N/A  Psychiatric Specialty Exam  Presentation  General Appearance:  Casual   Eye Contact: Good   Speech: Clear and Coherent   Speech Volume: Normal   Handedness: Right    Mood and Affect  Mood: Anxious   Affect: Appropriate    Thought Process  Thought Processes: Coherent   Descriptions of Associations:Circumstantial   Orientation:Full (Time, Place and Person)   Thought Content:Logical   Diagnosis of Schizophrenia or Schizoaffective disorder in past: No     Hallucinations:Hallucinations: None   Ideas of Reference:None   Suicidal  Thoughts:Suicidal Thoughts: No   Homicidal Thoughts:Homicidal Thoughts: No    Sensorium  Memory: Immediate Good   Judgment: Fair   Insight: Fair    Executive Functions  Concentration: Good   Attention Span: Good   Recall: Good   Fund of Knowledge: Good   Language: Fair    Psychomotor Activity  Psychomotor Activity: Psychomotor Activity: Normal    Assets  Assets: Desire for Improvement; Resilience    Sleep  Sleep: Sleep: Poor Number of Hours of Sleep: 4    Physical Exam  Physical Exam ROS Blood pressure 119/84, pulse (!) 103, temperature 98.3 F (36.8 C), temperature source Oral, resp. rate 16, SpO2 98 %. There is no height or weight on file to calculate BMI.  ASSESSMENT Derek Cruz is a 57 yo male w/ hx of type 2 diabetes, dyslipidemia, GERD, severe alcohol use disorder c/b alcoholic hepatitis and liver failure admitted to [facility] for alcohol detox.   Appears to be acutely withdrawing from alcohol.  Will order lipase to assess for alcoholic pancreatitis given lack of appetite, epigastric pain. PT-INR wnl. Diabetes poorly controlled at this time so started sensitive SSI and will adjust as needed. BP relatively stable and lipid panel shows only hypertriglyceridemia.   PLAN Alcohol use disorder Alcoholic hepatitis Elevated Lipase PT-INR wnl, lipase 64,  -Continue CIWA with as needed Ativan, AST 161, ALT 185 -Continue Ativan taper -Acute hepatitis panel, CMP and lipase ordered -MVI/thiamine   Type 2 diabetes mellitus, uncontrolled A1c 10.8 -Outpatient follow-up -Increase metformin to 1500 mg daily with breakfast -Sensitive SSI qac and qhs -Start semglee 5 mg qhs and adjust accordingly  Primary hypertension -Continue lisinopril 20 mg daily  Dyslipidemia -Continue lipitor 40 mg daily  GERD -Start pantoprazole 40 mg daily  Dispo home   Park Pope, MD 09/26/2022 8:58 AM

## 2022-09-26 NOTE — ED Notes (Addendum)
Patient A&O x 4, ambulatory. Patient discharged in no acute distress. Patient denied SI/HI, A/VH upon discharge. Patient escorted to lobby via ems for transport to [facility] Safety maintained. Patient was sent to [facility] to be medically cleared . If the patient is medical cleared he can return to [facility].

## 2022-09-26 NOTE — ED Notes (Signed)
Pt awake, pacing in hall way, tremors, chills, and nausea. Pt vomited while in bathroom. Medicated with Prn meds. Will continue to monitor for safety

## 2022-09-26 NOTE — ED Notes (Signed)
[facility] lab courier called to transport labs to [facility] lab 

## 2022-09-26 NOTE — ED Notes (Signed)
Patient reports some dizziness notified provider and medication was ordered for relief.

## 2022-09-26 NOTE — Discharge Instructions (Addendum)
You have high glucose, we are going to increase your metformin to 750 mg twice a day, to help control your glucose please follow-up with your primary care doctor, stop taking your 500 mg twice a day, and take the 750 mg twice a day instead.  Return to the ER if you have intractable nausea, vomiting, abdominal pain.  Please return to be [facility] for your detox.

## 2022-09-26 NOTE — ED Notes (Signed)
Patient states that he is no longer dizzy.Will continue to monitor for safety.

## 2022-09-26 NOTE — ED Notes (Signed)
Pt was dressed into purple scrubs in triage. Belongings taken by pt's son and daughter at bedside. Pt and pts family informed of visitation policy.   Security requested to wand pt.   Pt awaiting room assignment

## 2022-09-27 ENCOUNTER — Other Ambulatory Visit: Payer: Self-pay

## 2022-09-27 ENCOUNTER — Other Ambulatory Visit (HOSPITAL_COMMUNITY)
Admission: EM | Admit: 2022-09-27 | Discharge: 2022-09-29 | Disposition: A | Discharge status: Other Acute Inpatient Hospital | Payer: Self-pay | Attending: Psychiatry | Admitting: Psychiatry

## 2022-09-27 DIAGNOSIS — E1165 Type 2 diabetes mellitus with hyperglycemia: Secondary | ICD-10-CM | POA: Diagnosis not present

## 2022-09-27 DIAGNOSIS — F1020 Alcohol dependence, uncomplicated: Secondary | ICD-10-CM | POA: Insufficient documentation

## 2022-09-27 DIAGNOSIS — K219 Gastro-esophageal reflux disease without esophagitis: Secondary | ICD-10-CM | POA: Insufficient documentation

## 2022-09-27 DIAGNOSIS — F1010 Alcohol abuse, uncomplicated: Secondary | ICD-10-CM

## 2022-09-27 DIAGNOSIS — I10 Essential (primary) hypertension: Secondary | ICD-10-CM | POA: Diagnosis not present

## 2022-09-27 DIAGNOSIS — E119 Type 2 diabetes mellitus without complications: Secondary | ICD-10-CM | POA: Insufficient documentation

## 2022-09-27 DIAGNOSIS — E785 Hyperlipidemia, unspecified: Secondary | ICD-10-CM | POA: Insufficient documentation

## 2022-09-27 DIAGNOSIS — Z79899 Other long term (current) drug therapy: Secondary | ICD-10-CM | POA: Insufficient documentation

## 2022-09-27 LAB — GLUCOSE, CAPILLARY
Glucose-Capillary: 404 mg/dL — ABNORMAL HIGH (ref 70–99)
Glucose-Capillary: 271 mg/dL — ABNORMAL HIGH (ref 70–99)
Glucose-Capillary: 252 mg/dL — ABNORMAL HIGH (ref 70–99)
Glucose-Capillary: 284 mg/dL — ABNORMAL HIGH (ref 70–99)
Glucose-Capillary: 226 mg/dL — ABNORMAL HIGH (ref 70–99)
Glucose-Capillary: 342 mg/dL — ABNORMAL HIGH (ref 70–99)

## 2022-09-27 MED ORDER — LORAZEPAM 1 MG PO TABS: 1.0000 mg | ORAL_TABLET | Freq: Four times a day (QID) | ORAL | Status: DC

## 2022-09-27 MED ORDER — INSULIN GLARGINE-YFGN 100 UNIT/ML ~~LOC~~ SOLN
10.0000 [IU] | Freq: Every day | SUBCUTANEOUS | Status: DC
  Administered 2022-09-27: 10 [IU] via SUBCUTANEOUS

## 2022-09-27 MED ORDER — ACETAMINOPHEN 325 MG PO TABS: 650.0000 mg | ORAL_TABLET | Freq: Four times a day (QID) | ORAL | Status: DC | PRN

## 2022-09-27 MED ORDER — INSULIN ASPART 100 UNIT/ML IJ SOLN
0.0000 [IU] | Freq: Every day | INTRAMUSCULAR | Status: DC
  Administered 2022-09-27 – 2022-09-28 (×2): 4 [IU] via SUBCUTANEOUS

## 2022-09-27 MED ORDER — ADULT MULTIVITAMIN W/MINERALS CH
1.0000 | ORAL_TABLET | Freq: Every day | ORAL | Status: DC
  Administered 2022-09-27 – 2022-09-29 (×3): 1 via ORAL
  Filled 2022-09-27 (×3): qty 1

## 2022-09-27 MED ORDER — LORAZEPAM 1 MG PO TABS: 1.0000 mg | ORAL_TABLET | Freq: Once | ORAL | Status: DC

## 2022-09-27 MED ORDER — LORAZEPAM 1 MG PO TABS: 1.0000 mg | ORAL_TABLET | Freq: Two times a day (BID) | ORAL | Status: DC

## 2022-09-27 MED ORDER — ALUM & MAG HYDROXIDE-SIMETH 200-200-20 MG/5ML PO SUSP: 30.0000 mL | ORAL | Status: DC | PRN

## 2022-09-27 MED ORDER — LORAZEPAM 2 MG/ML IJ SOLN: 1.0000 mg | Freq: Four times a day (QID) | INTRAMUSCULAR | Status: DC | PRN

## 2022-09-27 MED ORDER — LORAZEPAM 1 MG PO TABS
1.0000 mg | ORAL_TABLET | Freq: Once | ORAL | Status: AC
  Administered 2022-09-29: 1 mg via ORAL
  Filled 2022-09-27: qty 1

## 2022-09-27 MED ORDER — BISMUTH SUBSALICYLATE 262 MG PO CHEW: 524.0000 mg | CHEWABLE_TABLET | ORAL | Status: DC | PRN

## 2022-09-27 MED ORDER — THIAMINE MONONITRATE 100 MG PO TABS
100.0000 mg | ORAL_TABLET | Freq: Every day | ORAL | Status: DC
  Administered 2022-09-27 – 2022-09-29 (×3): 100 mg via ORAL
  Filled 2022-09-27 (×3): qty 1

## 2022-09-27 MED ORDER — LOPERAMIDE HCL 2 MG PO CAPS: 2.0000 mg | ORAL_CAPSULE | ORAL | Status: DC | PRN

## 2022-09-27 MED ORDER — ONDANSETRON HCL 4 MG PO TABS
8.0000 mg | ORAL_TABLET | Freq: Three times a day (TID) | ORAL | Status: DC | PRN
  Administered 2022-09-28: 8 mg via ORAL
  Filled 2022-09-27: qty 2

## 2022-09-27 MED ORDER — METFORMIN HCL ER 750 MG PO TB24: 1500.0000 mg | ORAL_TABLET | Freq: Every day | ORAL | Status: DC

## 2022-09-27 MED ORDER — LORAZEPAM 1 MG PO TABS: 1.0000 mg | ORAL_TABLET | Freq: Every day | ORAL | Status: DC

## 2022-09-27 MED ORDER — LORAZEPAM 1 MG PO TABS
1.0000 mg | ORAL_TABLET | Freq: Four times a day (QID) | ORAL | Status: DC | PRN
  Administered 2022-09-27: 1 mg via ORAL
  Filled 2022-09-27: qty 1

## 2022-09-27 MED ORDER — POLYETHYLENE GLYCOL 3350 17 G PO PACK: 17.0000 g | PACK | Freq: Every day | ORAL | Status: DC | PRN

## 2022-09-27 MED ORDER — HYDROXYZINE HCL 25 MG PO TABS
25.0000 mg | ORAL_TABLET | Freq: Three times a day (TID) | ORAL | Status: DC | PRN
  Administered 2022-09-27 – 2022-09-28 (×2): 25 mg via ORAL
  Filled 2022-09-27 (×2): qty 1

## 2022-09-27 MED ORDER — ONDANSETRON 4 MG PO TBDP: 4.0000 mg | ORAL_TABLET | Freq: Four times a day (QID) | ORAL | Status: DC | PRN

## 2022-09-27 MED ORDER — INSULIN ASPART 100 UNIT/ML IJ SOLN
0.0000 [IU] | Freq: Three times a day (TID) | INTRAMUSCULAR | Status: DC
  Administered 2022-09-27: 5 [IU] via SUBCUTANEOUS
  Administered 2022-09-27 (×2): 8 [IU] via SUBCUTANEOUS
  Administered 2022-09-28: 5 [IU] via SUBCUTANEOUS
  Administered 2022-09-28 – 2022-09-29 (×3): 11 [IU] via SUBCUTANEOUS

## 2022-09-27 MED ORDER — LORAZEPAM 1 MG PO TABS
1.0000 mg | ORAL_TABLET | Freq: Two times a day (BID) | ORAL | Status: AC
  Administered 2022-09-28 (×2): 1 mg via ORAL
  Filled 2022-09-27 (×2): qty 1

## 2022-09-27 MED ORDER — LORAZEPAM 1 MG PO TABS: 1.0000 mg | ORAL_TABLET | Freq: Three times a day (TID) | ORAL | Status: DC

## 2022-09-27 MED ORDER — ATORVASTATIN CALCIUM 40 MG PO TABS
40.0000 mg | ORAL_TABLET | Freq: Every day | ORAL | Status: DC
  Administered 2022-09-27 – 2022-09-29 (×3): 40 mg via ORAL
  Filled 2022-09-27 (×3): qty 1

## 2022-09-27 MED ORDER — METFORMIN HCL 500 MG PO TABS: 750.0000 mg | ORAL_TABLET | Freq: Two times a day (BID) | ORAL | Status: DC

## 2022-09-27 MED ORDER — ACETAMINOPHEN 325 MG PO TABS
650.0000 mg | ORAL_TABLET | Freq: Four times a day (QID) | ORAL | Status: DC | PRN
  Administered 2022-09-27 – 2022-09-28 (×2): 650 mg via ORAL
  Filled 2022-09-27 (×2): qty 2

## 2022-09-27 MED ORDER — SENNA 8.6 MG PO TABS: 1.0000 | ORAL_TABLET | Freq: Every evening | ORAL | Status: DC | PRN

## 2022-09-27 MED ORDER — HYDROXYZINE HCL 25 MG PO TABS: 25.0000 mg | ORAL_TABLET | Freq: Four times a day (QID) | ORAL | Status: DC | PRN

## 2022-09-27 MED ORDER — LORAZEPAM 1 MG PO TABS
1.0000 mg | ORAL_TABLET | Freq: Two times a day (BID) | ORAL | Status: AC
  Administered 2022-09-27 (×2): 1 mg via ORAL
  Filled 2022-09-27 (×2): qty 1

## 2022-09-27 MED ORDER — METFORMIN HCL ER 750 MG PO TB24
1500.0000 mg | ORAL_TABLET | Freq: Every day | ORAL | Status: DC
  Administered 2022-09-27 – 2022-09-29 (×3): 1500 mg via ORAL
  Filled 2022-09-27 (×3): qty 2

## 2022-09-27 MED ORDER — PANTOPRAZOLE SODIUM 40 MG PO TBEC
40.0000 mg | DELAYED_RELEASE_TABLET | Freq: Every day | ORAL | Status: DC
  Administered 2022-09-27 – 2022-09-29 (×3): 40 mg via ORAL
  Filled 2022-09-27 (×3): qty 1

## 2022-09-27 MED ORDER — MECLIZINE HCL 12.5 MG PO TABS: 25.0000 mg | ORAL_TABLET | Freq: Two times a day (BID) | ORAL | Status: DC | PRN

## 2022-09-27 MED ORDER — MAGNESIUM HYDROXIDE 400 MG/5ML PO SUSP: 30.0000 mL | Freq: Every day | ORAL | Status: DC | PRN

## 2022-09-27 NOTE — Group Note (Signed)
Group Topic: Decisional Balance/Substance Abuse  Group Date: 09/27/2022 Start Time: 0130 End Time: 0230 Facilitators: Lenny Pastel  Department: [facility]  Number of Participants: 1  Group Focus: activities of daily living skills, chemical dependency issues, clarity of thought, communication, coping skills, feeling awareness/expression, goals/reality orientation, personal responsibility, problem solving, relapse prevention, safety plan, and self-awareness Treatment Modality:  Behavior Modification Therapy and Spiritual Interventions utilized were clarification, exploration, problem solving, story telling, and support Purpose: enhance coping skills, explore maladaptive thinking, express feelings, improve communication skills, increase insight, regain self-worth, reinforce self-care, relapse prevention strategies, and trigger / craving management  Name: Derek Cruz Date of Birth: December 12, 1965  MR: [id number]    Level of Participation: active Quality of Participation: cooperative Interactions with others: gave feedback Mood/Affect: appropriate Triggers (if applicable): Patient reports his triggers is social drinking and believing he will be alright if he only drinks 1-2 beers.  Cognition: coherent/clear, goal directed, insightful, and logical Progress: Gaining insight Response: Patient reports he enjoyed listening to the inspirational video on today. Patient reports his goal is to quit drinking in totality as he does not want to lose his family. Patient spoke about his 2 year period of sobriety and the help he received from his wife. Patient reports he would like to get back to a place of happiness without alcohol. LCSW provided brief supportive counseling to the patient and he was receptive to the feedback provided.  Plan: referral / recommendations  Patients Problems:  Patient Active Problem List   Diagnosis Date Noted   Elevated liver  enzymes    Alcohol abuse    Liver failure (HCC) 05/07/2020   Alcoholic hepatitis    Hyponatremia    Polycythemia    Hemoglobin A1c less than 7.0% 07/16/2019   Neck pain 03/31/2019   Muscle strain 03/31/2019   Undifferentiated abdominal pain 03/31/2019   Gastroesophageal reflux disease without esophagitis 11/24/2018   Chest discomfort 11/24/2018   Essential hypertension 11/24/2018   Pre-diabetes 11/24/2018   Hyperlipidemia 10/06/2016   Chest pain 09/26/2016   Diabetes mellitus type 2, controlled (HCC) 09/26/2016

## 2022-09-27 NOTE — ED Notes (Addendum)
Patient has been calm and cooperative with staff and other patients. Patient has spent some time in the dayroom watching television and attending groups.

## 2022-09-27 NOTE — ED Notes (Addendum)
Patient denies SI/HI and AVH. Patient has been calm and cooperative throughout the shift. Patient has been attending groups and spending time in the day room. Patient has been on sliding scale Novolog and his blood sugar was 252 prior to breakfast and 284 prior to lunch. Patient received coverage both times.

## 2022-09-27 NOTE — Progress Notes (Addendum)
Progress note   D: Pt seen at nurse's station. Pt denies SI, HI, AVH. Pt rates pain  2/10 as abdominal pain on left side. Says it started today.  Pt rates anxiety  0/10 and depression  0/10. Pt states he has had a good day. Pt denying withdrawal symptoms. Tremors can be seen when pt handles a cup. Pt concerned about sleep. CBG has been in the 200s. Pt encouraged to watch his snacks. Pt endorsed dizziness earlier in the day. Attended group this evening. No other concerns noted at this time.  A: Pt provided support and encouragement. Pt given scheduled medication as prescribed. PRNs as appropriate. Q15 min checks for safety.   R: Pt safe on the unit. Will continue to monitor.

## 2022-09-27 NOTE — ED Notes (Signed)
Patient A&Ox4. Patient denies SI/HI and AVH. Patient denies any physical complaints when asked. No acute distress noted. Support and encouragement provided. Routine safety checks conducted according to facility protocol. Encouraged patient to notify staff if thoughts of harm toward self or others arise. Patient verbalize understanding and agreement. Will continue to monitor for safety.    

## 2022-09-27 NOTE — Group Note (Signed)
Group Topic: Identity and Relationships  Group Date: 09/27/2022 Start Time: 1010 End Time: 1055 Facilitators: Vonzell Schlatter B  Department: [facility]  Number of Participants: 5  Group Focus: daily focus Treatment Modality:  Psychoeducation Interventions utilized were reality testing Purpose: regain self-worth  Name: Derek Cruz Date of Birth: 03-Sep-1965  MR: [id number]    Level of Participation: active Quality of Participation: attentive and cooperative Interactions with others: gave feedback Mood/Affect: positive Triggers (if applicable): na Cognition: coherent/clear Progress: Moderate Response: na Plan: follow-up needed  Patients Problems:  Patient Active Problem List   Diagnosis Date Noted   Elevated liver enzymes    Alcohol abuse    Liver failure (HCC) 05/07/2020   Alcoholic hepatitis    Hyponatremia    Polycythemia    Hemoglobin A1c less than 7.0% 07/16/2019   Neck pain 03/31/2019   Muscle strain 03/31/2019   Undifferentiated abdominal pain 03/31/2019   Gastroesophageal reflux disease without esophagitis 11/24/2018   Chest discomfort 11/24/2018   Essential hypertension 11/24/2018   Pre-diabetes 11/24/2018   Hyperlipidemia 10/06/2016   Chest pain 09/26/2016   Diabetes mellitus type 2, controlled (HCC) 09/26/2016

## 2022-09-27 NOTE — ED Notes (Signed)
Pt's belongings given to family upon arrival, pt being transported without any personal belongings.

## 2022-09-27 NOTE — ED Notes (Signed)
Pt on phone at this hour. No apparent distress. RR even and unlabored. Monitored for safety.

## 2022-09-27 NOTE — ED Provider Notes (Signed)
Facility Based Crisis Admission H&P  Date: 09/27/22 Patient Name: Derek Cruz MRN: [id number] Chief Complaint: "alcohol abuse"  Diagnoses:  Final diagnoses:  Alcohol abuse    HPI: Derek Cruz is a 57 yo male w/ hx of type 2 diabetes, dyslipidemia, GERD, severe alcohol use disorder c/b alcoholic hepatitis and liver failure admitted to [facility] for alcohol detox and has been drinking 8-10 beers per day for past 3 months. Last drink 8 beers yesterday. Patient also had suicidal plans yesterday, and plan to kill himself, by taking a knife and hurting himself.   Today, Patient denies SI/HI/AVH or paranoia.   Patient was seen face to face by this provider and chart reviewed.  On approach, patient is seated, calm, and in no distress.   Patient transferred from the [facility] to [facility] for medical clearance via EMS 09/26/22 due to hyperglycemia. Patient medically cleared and returned to [facility] 09/27/22 to continue his substance abuse treatment.   Per [facility] notes: "Details: Hyperglycemia, elevated transaminitis, positive for benzos, no evidence of acidosis on VBG Discussion of management or test interpretation with external provider(s): Discussed with patient, he denies any SI, HI, AVH.  He is well-appearing, and he states that he has been cleared by psychiatric, to be [facility], I reviewed his notes he is currently there for alcohol detox.  He has no plan, and is not suicidal at this time for me.  His glucose is elevated, however he is currently on metformin we will increase his metformin to 750 mg twice a day, have him follow-up with primary care doctor, he is not currently in DKA or HHS, this is reassuring, received fluids, and after eating glucose was 294".  Pt reports readiness to continue his substance abuse/alcohol detox treatment.  No other concerns voiced at this time.   Support, encouragement and reassurance provided about ongoing stressors. Patient is provided with opportunity  for questions.   On evaluation, patient is alert, oriented x 4, and cooperative. Speech is clear and coherent. Pt appears casual. Eye contact is good. Mood is euthymic, affect is congruent with mood. Thought process is coherent and thought content is WDL. Pt denies SI/HI/AVH or paranoia. There is no objective indication that the patient is responding to internal stimuli. No delusions elicited during this assessment.    PHQ 2-9:  Flowsheet Row Office Visit from 09/14/2020 in [facility]  Thoughts that you would be better off dead, or of hurting yourself in some way Not at all  PHQ-9 Total Score 0       Flowsheet Row ED from 09/26/2022 in [facility] Emergency Department at [facility] ED from 09/25/2022 in [facility] ED from 09/23/2022 in [facility] Emergency Department at [facility]  C-SSRS RISK CATEGORY High Risk No Risk No Risk         Total Time spent with patient: 15 minutes  Musculoskeletal  Strength & Muscle Tone: within normal limits Gait & Station: normal Patient leans: N/A  Psychiatric Specialty Exam  Presentation General Appearance:  Appropriate for Environment  Eye Contact: Good  Speech: Clear and Coherent  Speech Volume: Normal  Handedness: Right   Mood and Affect  Mood: Euthymic  Affect: Appropriate   Thought Process  Thought Processes: Coherent  Descriptions of Associations:Intact  Orientation:Full (Time, Place and Person)  Thought Content:WDL  Diagnosis of Schizophrenia or Schizoaffective disorder in past: No   Hallucinations:Hallucinations: None  Ideas of Reference:None  Suicidal Thoughts:Suicidal Thoughts:  No  Homicidal Thoughts:Homicidal Thoughts: No   Sensorium  Memory: Immediate Fair  Judgment: Fair  Insight: Fair   Executive Functions  Concentration: Good  Attention Span: Good  Recall: Good  Fund of  Knowledge: Good  Language: Fair   Psychomotor Activity  Psychomotor Activity: Psychomotor Activity: Normal   Assets  Assets: Communication Skills; Desire for Improvement; Social Support; Resilience   Sleep  Sleep: Sleep: Fair   Nutritional Assessment (For [facility] and [facility] admissions only) Has the patient had a weight loss or gain of 10 pounds or more in the last 3 months?: No Has the patient had a decrease in food intake/or appetite?: No Does the patient have dental problems?: No Does the patient have eating habits or behaviors that may be indicators of an eating disorder including binging or inducing vomiting?: No Has the patient recently lost weight without trying?: 0 Has the patient been eating poorly because of a decreased appetite?: 0 Malnutrition Screening Tool Score: 0    Physical Exam Constitutional:      General: He is not in acute distress.    Appearance: He is not diaphoretic.  HENT:     Head: Normocephalic.     Right Ear: External ear normal.     Left Ear: External ear normal.     Nose: No congestion.  Eyes:     General:        Right eye: No discharge.        Left eye: No discharge.  Cardiovascular:     Rate and Rhythm: Normal rate.  Pulmonary:     Effort: No respiratory distress.  Chest:     Chest wall: No tenderness.  Neurological:     Mental Status: He is alert and oriented to person, place, and time.  Psychiatric:        Attention and Perception: Attention and perception normal.        Mood and Affect: Mood and affect normal.        Speech: Speech normal.        Behavior: Behavior is cooperative.        Thought Content: Thought content normal. Thought content is not paranoid or delusional. Thought content does not include homicidal or suicidal ideation. Thought content does not include homicidal or suicidal plan.        Cognition and Memory: Cognition and memory normal.    Review of Systems  Constitutional:  Negative for chills, diaphoresis  and fever.  HENT:  Negative for congestion.   Eyes:  Negative for discharge.  Respiratory:  Negative for cough, shortness of breath and wheezing.   Cardiovascular:  Negative for chest pain and palpitations.  Gastrointestinal:  Negative for diarrhea, nausea and vomiting.  Neurological:  Negative for dizziness, seizures, loss of consciousness, weakness and headaches.  Psychiatric/Behavioral:  Positive for substance abuse.     Blood pressure (!) 136/98, pulse 91, temperature 98.7 F (37.1 C), temperature source Oral, resp. rate 18, SpO2 97 %. There is no height or weight on file to calculate BMI.  Past Psychiatric History: See H & P   Is the patient at risk to self? No  Has the patient been a risk to self in the past 6 months? No .    Has the patient been a risk to self within the distant past? No   Is the patient a risk to others? No   Has the patient been a risk to others in the past 6 months? No   Has  the patient been a risk to others within the distant past? No   Past Medical History: See Chart Family History: N/A Social History: N/A  Last Labs:  Admission on 09/26/2022, Discharged on 09/27/2022  Component Date Value Ref Range Status   WBC 09/26/2022 8.1  4.0 - 10.5 K/uL Final   RBC 09/26/2022 4.88  4.22 - 5.81 MIL/uL Final   Hemoglobin 09/26/2022 15.9  13.0 - 17.0 g/dL Final   HCT 16/02/9603 44.4  39.0 - 52.0 % Final   MCV 09/26/2022 91.0  80.0 - 100.0 fL Final   MCH 09/26/2022 32.6  26.0 - 34.0 pg Final   MCHC 09/26/2022 35.8  30.0 - 36.0 g/dL Final   RDW 54/01/8118 13.5  11.5 - 15.5 % Final   Platelets 09/26/2022 150  150 - 400 K/uL Final   nRBC 09/26/2022 0.0  0.0 - 0.2 % Final   Performed at [facility] Lab, [address] [place], [place] [address]   Sodium 09/26/2022 131 (L)  135 - 145 mmol/L Final   Potassium 09/26/2022 4.2  3.5 - 5.1 mmol/L Final   Chloride 09/26/2022 98  98 - 111 mmol/L Final   CO2 09/26/2022 18 (L)  22 - 32 mmol/L Final   Glucose, Bld  09/26/2022 296 (H)  70 - 99 mg/dL Final   Glucose reference range applies only to samples taken after fasting for at least 8 hours.   BUN 09/26/2022 18  6 - 20 mg/dL Final   Creatinine, Ser 09/26/2022 0.88  0.61 - 1.24 mg/dL Final   Calcium 95/62/1308 9.9  8.9 - 10.3 mg/dL Final   Total Protein 65/78/4696 7.1  6.5 - 8.1 g/dL Final   Albumin 29/52/8413 3.8  3.5 - 5.0 g/dL Final   AST 24/40/1027 112 (H)  15 - 41 U/L Final   ALT 09/26/2022 172 (H)  0 - 44 U/L Final   Alkaline Phosphatase 09/26/2022 162 (H)  38 - 126 U/L Final   Total Bilirubin 09/26/2022 0.6  0.3 - 1.2 mg/dL Final   GFR, Estimated 09/26/2022 >60  >60 mL/min Final   Comment: (NOTE) Calculated using the CKD-EPI Creatinine Equation (2021)    Anion gap 09/26/2022 15  5 - 15 Final   Performed at [facility] Lab, [address], [place], [place] [address]   Glucose-Capillary 09/26/2022 281 (H)  70 - 99 mg/dL Final   Glucose reference range applies only to samples taken after fasting for at least 8 hours.   Alcohol, Ethyl (B) 09/26/2022 <10  <10 mg/dL Final   Comment: (NOTE) Lowest detectable limit for serum alcohol is 10 mg/dL.  For medical purposes only. Performed at [facility] Lab, [address] [place], [place] [address]    Color, Urine 09/26/2022 YELLOW  YELLOW Final   APPearance 09/26/2022 CLEAR  CLEAR Final   Specific Gravity, Urine 09/26/2022 1.023  1.005 - 1.030 Final   pH 09/26/2022 5.0  5.0 - 8.0 Final   Glucose, UA 09/26/2022 >=500 (A)  NEGATIVE mg/dL Final   Hgb urine dipstick 09/26/2022 NEGATIVE  NEGATIVE Final   Bilirubin Urine 09/26/2022 NEGATIVE  NEGATIVE Final   Ketones, ur 09/26/2022 NEGATIVE  NEGATIVE mg/dL Final   Protein, ur 74/25/9563 NEGATIVE  NEGATIVE mg/dL Final   Nitrite 87/56/4332 NEGATIVE  NEGATIVE Final   Leukocytes,Ua 09/26/2022 NEGATIVE  NEGATIVE Final   RBC / HPF 09/26/2022 0-5  0 - 5 RBC/hpf Final   WBC, UA 09/26/2022 0-5  0 - 5 WBC/hpf Final  Bacteria, UA 09/26/2022 RARE (A)   NONE SEEN Final   Squamous Epithelial / HPF 09/26/2022 0-5  0 - 5 /HPF Final   Performed at [facility] Lab, [address] [place], [place] [address]   Opiates 09/26/2022 NONE DETECTED  NONE DETECTED Final   Cocaine 09/26/2022 NONE DETECTED  NONE DETECTED Final   Benzodiazepines 09/26/2022 POSITIVE (A)  NONE DETECTED Final   Amphetamines 09/26/2022 NONE DETECTED  NONE DETECTED Final   Tetrahydrocannabinol 09/26/2022 NONE DETECTED  NONE DETECTED Final   Barbiturates 09/26/2022 NONE DETECTED  NONE DETECTED Final   Comment: (NOTE) DRUG SCREEN FOR MEDICAL PURPOSES ONLY.  IF CONFIRMATION IS NEEDED FOR ANY PURPOSE, NOTIFY LAB WITHIN 5 DAYS.  LOWEST DETECTABLE LIMITS FOR URINE DRUG SCREEN Drug Class                     Cutoff (ng/mL) Amphetamine and metabolites    1000 Barbiturate and metabolites    200 Benzodiazepine                 200 Opiates and metabolites        300 Cocaine and metabolites        300 THC                            50 Performed at [facility] Lab, [address] [place], [place] [address]    pH, Ven 09/26/2022 7.437 (H)  7.25 - 7.43 Final   pCO2, Ven 09/26/2022 31.6 (L)  44 - 60 mmHg Final   pO2, Ven 09/26/2022 87 (H)  32 - 45 mmHg Final   Bicarbonate 09/26/2022 21.3  20.0 - 28.0 mmol/L Final   TCO2 09/26/2022 22  22 - 32 mmol/L Final   O2 Saturation 09/26/2022 97  % Final   Acid-base deficit 09/26/2022 2.0  0.0 - 2.0 mmol/L Final   Sodium 09/26/2022 133 (L)  135 - 145 mmol/L Final   Potassium 09/26/2022 3.6  3.5 - 5.1 mmol/L Final   Calcium, Ion 09/26/2022 1.19  1.15 - 1.40 mmol/L Final   HCT 09/26/2022 42.0  39.0 - 52.0 % Final   Hemoglobin 09/26/2022 14.3  13.0 - 17.0 g/dL Final   Sample type 78/46/9629 VENOUS   Final   Glucose-Capillary 09/26/2022 294 (H)  70 - 99 mg/dL Final   Glucose reference range applies only to samples taken after fasting for at least 8 hours.  Admission on 09/25/2022, Discharged on 09/26/2022  Component Date Value Ref  Range Status   WBC 09/25/2022 5.2  4.0 - 10.5 K/uL Final   RBC 09/25/2022 5.20  4.22 - 5.81 MIL/uL Final   Hemoglobin 09/25/2022 16.9  13.0 - 17.0 g/dL Final   HCT 52/84/1324 46.0  39.0 - 52.0 % Final   MCV 09/25/2022 88.5  80.0 - 100.0 fL Final   MCH 09/25/2022 32.5  26.0 - 34.0 pg Final   MCHC 09/25/2022 36.7 (H)  30.0 - 36.0 g/dL Final   RDW 40/02/2724 13.2  11.5 - 15.5 % Final   Platelets 09/25/2022 146 (L)  150 - 400 K/uL Final   nRBC 09/25/2022 0.0  0.0 - 0.2 % Final   Neutrophils Relative % 09/25/2022 55  % Final   Neutro Abs 09/25/2022 2.8  1.7 - 7.7 K/uL Final   Lymphocytes Relative 09/25/2022 32  % Final   Lymphs Abs 09/25/2022 1.7  0.7 - 4.0 K/uL Final   Monocytes  Relative 09/25/2022 11  % Final   Monocytes Absolute 09/25/2022 0.6  0.1 - 1.0 K/uL Final   Eosinophils Relative 09/25/2022 1  % Final   Eosinophils Absolute 09/25/2022 0.1  0.0 - 0.5 K/uL Final   Basophils Relative 09/25/2022 1  % Final   Basophils Absolute 09/25/2022 0.0  0.0 - 0.1 K/uL Final   Immature Granulocytes 09/25/2022 0  % Final   Abs Immature Granulocytes 09/25/2022 0.02  0.00 - 0.07 K/uL Final   Performed at [facility], [address] [place], [place] [address]   Sodium 09/25/2022 133 (L)  135 - 145 mmol/L Final   Potassium 09/25/2022 3.5  3.5 - 5.1 mmol/L Final   Chloride 09/25/2022 100  98 - 111 mmol/L Final   CO2 09/25/2022 17 (L)  22 - 32 mmol/L Final   Glucose, Bld 09/25/2022 251 (H)  70 - 99 mg/dL Final   Glucose reference range applies only to samples taken after fasting for at least 8 hours.   BUN 09/25/2022 <5 (L)  6 - 20 mg/dL Final   Creatinine, Ser 09/25/2022 0.64  0.61 - 1.24 mg/dL Final   Calcium 62/13/0865 9.7  8.9 - 10.3 mg/dL Final   Total Protein 78/46/9629 7.4  6.5 - 8.1 g/dL Final   Albumin 52/84/1324 4.2  3.5 - 5.0 g/dL Final   AST 40/02/2724 111 (H)  15 - 41 U/L Final   ALT 09/25/2022 172 (H)  0 - 44 U/L Final   Alkaline Phosphatase 09/25/2022 132 (H)  38 - 126 U/L  Final   Total Bilirubin 09/25/2022 0.5  0.3 - 1.2 mg/dL Final   GFR, Estimated 09/25/2022 >60  >60 mL/min Final   Comment: (NOTE) Calculated using the CKD-EPI Creatinine Equation (2021)    Anion gap 09/25/2022 16 (H)  5 - 15 Final   Performed at [facility] Lab, [address], [place], [place] [address]   Hgb A1c MFr Bld 09/25/2022 10.8 (H)  4.8 - 5.6 % Final   Comment: (NOTE) Pre diabetes:          5.7%-6.4%  Diabetes:              >6.4%  Glycemic control for   <7.0% adults with diabetes    Mean Plasma Glucose 09/25/2022 263.26  mg/dL Final   Performed at [facility] Lab, [address], [place], [place] [address]   Alcohol, Ethyl (B) 09/25/2022 290 (H)  <10 mg/dL Final   Comment: (NOTE) Lowest detectable limit for serum alcohol is 10 mg/dL.  For medical purposes only. Performed at [facility], [address], [place], [place] [address]    POC Amphetamine UR 09/26/2022 None Detected  NONE DETECTED (Cut Off Level 1000 ng/mL) Final   POC Secobarbital (BAR) 09/26/2022 None Detected  NONE DETECTED (Cut Off Level 300 ng/mL) Final   POC Buprenorphine (BUP) 09/26/2022 None Detected  NONE DETECTED (Cut Off Level 10 ng/mL) Final   POC Oxazepam (BZO) 09/26/2022 Positive (A)  NONE DETECTED (Cut Off Level 300 ng/mL) Final   POC Cocaine UR 09/26/2022 None Detected  NONE DETECTED (Cut Off Level 300 ng/mL) Final   POC Methamphetamine UR 09/26/2022 None Detected  NONE DETECTED (Cut Off Level 1000 ng/mL) Final   POC Morphine 09/26/2022 None Detected  NONE DETECTED (Cut Off Level 300 ng/mL) Final   POC Methadone UR 09/26/2022 None Detected  NONE DETECTED (Cut Off Level 300 ng/mL) Final   POC Oxycodone UR 09/26/2022 None Detected  NONE DETECTED (Cut Off Level 100 ng/mL) Final   POC Marijuana UR 09/26/2022 None Detected  NONE DETECTED (Cut Off Level 50 ng/mL) Final   Cholesterol 09/25/2022 192  0 - 200 mg/dL Final   Triglycerides 16/02/9603 169 (H)  <150 mg/dL Final   HDL 54/01/8118  98  >40 mg/dL Final   Total CHOL/HDL Ratio 09/25/2022 2.0  RATIO Final   VLDL 09/25/2022 34  0 - 40 mg/dL Final   LDL Cholesterol 09/25/2022 60  0 - 99 mg/dL Final   Comment:        Total Cholesterol/HDL:CHD Risk Coronary Heart Disease Risk Table                     Men   Women  1/2 Average Risk   3.4   3.3  Average Risk       5.0   4.4  2 X Average Risk   9.6   7.1  3 X Average Risk  23.4   11.0        Use the calculated Patient Ratio above and the CHD Risk Table to determine the patient's CHD Risk.        ATP III CLASSIFICATION (LDL):  <100     mg/dL   Optimal  147-829  mg/dL   Near or Above                    Optimal  130-159  mg/dL   Borderline  562-130  mg/dL   High  >865     mg/dL   Very High Performed at [facility] Lab, [address] [place], [place] [address]    TSH 09/25/2022 1.526  0.350 - 4.500 uIU/mL Final   Comment: Performed by a 3rd Generation assay with a functional sensitivity of <=0.01 uIU/mL. Performed at [facility], [address], [place], [place] [address]    Prothrombin Time 09/26/2022 13.0  11.4 - 15.2 seconds Final   INR 09/26/2022 1.0  0.8 - 1.2 Final   Comment: (NOTE) INR goal varies based on device and disease states. Performed at [facility], [address], [place], [place] [address]    Hepatitis B Surface Ag 09/26/2022 NON REACTIVE  NON REACTIVE Final   HCV Ab 09/26/2022 NON REACTIVE  NON REACTIVE Final   Comment: (NOTE) Nonreactive HCV antibody screen is consistent with no HCV infections,  unless recent infection is suspected or other evidence exists to indicate HCV infection.     Hep A IgM 09/26/2022 NON REACTIVE  NON REACTIVE Final   Hep B C IgM 09/26/2022 NON REACTIVE  NON REACTIVE Final   Performed at [facility], [address] [place], [place] [address]   Glucose-Capillary 09/26/2022 192 (H)  70 - 99 mg/dL Final   Glucose reference range applies only to samples taken after fasting for at least 8 hours.    Lipase 09/26/2022 64 (H)  11 - 51 U/L Final   Performed at [facility], [address], [place], [place] [address]   Sodium 09/26/2022 135  135 - 145 mmol/L Final   Potassium 09/26/2022 3.6  3.5 - 5.1 mmol/L Final   Chloride 09/26/2022 102  98 - 111 mmol/L Final   CO2 09/26/2022 20 (L)  22 - 32 mmol/L Final   Glucose, Bld 09/26/2022 298 (H)  70 - 99 mg/dL Final   Glucose reference range applies only to samples taken after fasting for  at least 8 hours.   BUN 09/26/2022 10  6 - 20 mg/dL Final   Creatinine, Ser 09/26/2022 0.71  0.61 - 1.24 mg/dL Final   Calcium 16/02/9603 9.5  8.9 - 10.3 mg/dL Final   Total Protein 54/01/8118 7.3  6.5 - 8.1 g/dL Final   Albumin 14/78/2956 3.9  3.5 - 5.0 g/dL Final   AST 21/30/8657 161 (H)  15 - 41 U/L Final   ALT 09/26/2022 185 (H)  0 - 44 U/L Final   Alkaline Phosphatase 09/26/2022 103  38 - 126 U/L Final   Total Bilirubin 09/26/2022 0.8  0.3 - 1.2 mg/dL Final   GFR, Estimated 09/26/2022 >60  >60 mL/min Final   Comment: (NOTE) Calculated using the CKD-EPI Creatinine Equation (2021)    Anion gap 09/26/2022 13  5 - 15 Final   Performed at [facility] Lab, [address], [place], [place] [address]   Glucose-Capillary 09/26/2022 315 (H)  70 - 99 mg/dL Final   Glucose reference range applies only to samples taken after fasting for at least 8 hours.   Glucose-Capillary 09/26/2022 387 (H)  70 - 99 mg/dL Final   Glucose reference range applies only to samples taken after fasting for at least 8 hours.  Admission on 09/23/2022, Discharged on 09/24/2022  Component Date Value Ref Range Status   Sodium 09/23/2022 134 (L)  135 - 145 mmol/L Final   Potassium 09/23/2022 3.6  3.5 - 5.1 mmol/L Final   Chloride 09/23/2022 96 (L)  98 - 111 mmol/L Final   CO2 09/23/2022 19 (L)  22 - 32 mmol/L Final   Glucose, Bld 09/23/2022 315 (H)  70 - 99 mg/dL Final   Glucose reference range applies only to samples taken after fasting for at least 8 hours.   BUN  09/23/2022 <5 (L)  6 - 20 mg/dL Final   Creatinine, Ser 09/23/2022 0.63  0.61 - 1.24 mg/dL Final   Calcium 29/52/8413 9.1  8.9 - 10.3 mg/dL Final   GFR, Estimated 09/23/2022 >60  >60 mL/min Final   Comment: (NOTE) Calculated using the CKD-EPI Creatinine Equation (2021)    Anion gap 09/23/2022 19 (H)  5 - 15 Final   Performed at [facility], [address] [place], [place] [address]   WBC 09/23/2022 3.9 (L)  4.0 - 10.5 K/uL Final   RBC 09/23/2022 5.19  4.22 - 5.81 MIL/uL Final   Hemoglobin 09/23/2022 16.8  13.0 - 17.0 g/dL Final   HCT 02/72/5366 46.3  39.0 - 52.0 % Final   MCV 09/23/2022 89.2  80.0 - 100.0 fL Final   MCH 09/23/2022 32.4  26.0 - 34.0 pg Final   MCHC 09/23/2022 36.3 (H)  30.0 - 36.0 g/dL Final   CORRECTED FOR COLD AGGLUTININS   RDW 09/23/2022 13.6  11.5 - 15.5 % Final   Platelets 09/23/2022 164  150 - 400 K/uL Final   REPEATED TO VERIFY   nRBC 09/23/2022 0.0  0.0 - 0.2 % Final   Performed at [facility] Lab, [address] [place], [place] [address]   Troponin I (High Sensitivity) 09/23/2022 11  <18 ng/L Final   Comment: (NOTE) Elevated high sensitivity troponin I (hsTnI) values and significant  changes across serial measurements may suggest ACS but many other  chronic and acute conditions are known to elevate hsTnI results.  Refer to the "Links" section for chest pain algorithms and additional  guidance. Performed at [facility], [address]  [place], [place] [address]    Troponin I (High Sensitivity) 09/23/2022 8  <18 ng/L Final   Comment: (NOTE) Elevated high sensitivity troponin I (hsTnI) values and significant  changes across serial measurements may suggest ACS but many other  chronic and acute conditions are known to elevate hsTnI results.  Refer to the "Links" section for chest pain algorithms and additional  guidance. Performed at [facility], [address] [place], [place] [address]    Total Protein 09/23/2022 7.8  6.5 -  8.1 g/dL Final   Albumin 09/81/1914 4.1  3.5 - 5.0 g/dL Final   AST 78/29/5621 127 (H)  15 - 41 U/L Final   ALT 09/23/2022 152 (H)  0 - 44 U/L Final   Alkaline Phosphatase 09/23/2022 111  38 - 126 U/L Final   Total Bilirubin 09/23/2022 0.6  0.3 - 1.2 mg/dL Final   Bilirubin, Direct 09/23/2022 0.2  0.0 - 0.2 mg/dL Final   Indirect Bilirubin 09/23/2022 0.4  0.3 - 0.9 mg/dL Final   Performed at [facility], [address], [place], [place] [address]   Alcohol, Ethyl (B) 09/23/2022 207 (H)  <10 mg/dL Final   Comment: (NOTE) Lowest detectable limit for serum alcohol is 10 mg/dL.  For medical purposes only. Performed at [facility] Lab, [address], [place], [place] [address]    Acetaminophen (Tylenol), Serum 09/23/2022 <10 (L)  10 - 30 ug/mL Final   Comment: (NOTE) Therapeutic concentrations vary significantly. A range of 10-30 ug/mL  may be an effective concentration for many patients. However, some  are best treated at concentrations outside of this range. Acetaminophen concentrations >150 ug/mL at 4 hours after ingestion  and >50 ug/mL at 12 hours after ingestion are often associated with  toxic reactions.  Performed at [facility] Lab, [address], [place], [place] [address]    Salicylate Lvl 09/23/2022 <7.0 (L)  7.0 - 30.0 mg/dL Final   Performed at [facility] Lab, [address] [place], [place] [address]   Opiates 09/23/2022 NONE DETECTED  NONE DETECTED Final   Cocaine 09/23/2022 NONE DETECTED  NONE DETECTED Final   Benzodiazepines 09/23/2022 NONE DETECTED  NONE DETECTED Final   Amphetamines 09/23/2022 NONE DETECTED  NONE DETECTED Final   Tetrahydrocannabinol 09/23/2022 NONE DETECTED  NONE DETECTED Final   Barbiturates 09/23/2022 NONE DETECTED  NONE DETECTED Final   Comment: (NOTE) DRUG SCREEN FOR MEDICAL PURPOSES ONLY.  IF CONFIRMATION IS NEEDED FOR ANY PURPOSE, NOTIFY LAB WITHIN 5 DAYS.  LOWEST DETECTABLE LIMITS FOR URINE DRUG SCREEN Drug Class                      Cutoff (ng/mL) Amphetamine and metabolites    1000 Barbiturate and metabolites    200 Benzodiazepine                 200 Opiates and metabolites        300 Cocaine and metabolites        300 THC                            50 Performed at [facility], [address], [place], [place] [address]    Color, Urine 09/23/2022 YELLOW  YELLOW Final   APPearance 09/23/2022 CLEAR  CLEAR Final   Specific Gravity, Urine 09/23/2022 1.033 (H)  1.005 - 1.030 Final   pH 09/23/2022 6.0  5.0 -  8.0 Final   Glucose, UA 09/23/2022 >=500 (A)  NEGATIVE mg/dL Final   Hgb urine dipstick 09/23/2022 MODERATE (A)  NEGATIVE Final   Bilirubin Urine 09/23/2022 NEGATIVE  NEGATIVE Final   Ketones, ur 09/23/2022 5 (A)  NEGATIVE mg/dL Final   Protein, ur 16/02/9603 100 (A)  NEGATIVE mg/dL Final   Nitrite 54/01/8118 NEGATIVE  NEGATIVE Final   Leukocytes,Ua 09/23/2022 NEGATIVE  NEGATIVE Final   RBC / HPF 09/23/2022 0-5  0 - 5 RBC/hpf Final   WBC, UA 09/23/2022 0-5  0 - 5 WBC/hpf Final   Bacteria, UA 09/23/2022 NONE SEEN  NONE SEEN Final   Squamous Epithelial / HPF 09/23/2022 0-5  0 - 5 /HPF Final   Mucus 09/23/2022 PRESENT   Final   Performed at [facility], [address] [place], [place] [address]   Lipase 09/23/2022 41  11 - 51 U/L Final   Performed at [facility], [address], [place], [place] [address]   pH, Ven 09/23/2022 7.463 (H)  7.25 - 7.43 Final   pCO2, Ven 09/23/2022 32.1 (L)  44 - 60 mmHg Final   pO2, Ven 09/23/2022 68 (H)  32 - 45 mmHg Final   Bicarbonate 09/23/2022 23.0  20.0 - 28.0 mmol/L Final   TCO2 09/23/2022 24  22 - 32 mmol/L Final   O2 Saturation 09/23/2022 95  % Final   Acid-Base Excess 09/23/2022 0.0  0.0 - 2.0 mmol/L Final   Sodium 09/23/2022 134 (L)  135 - 145 mmol/L Final   Potassium 09/23/2022 3.6  3.5 - 5.1 mmol/L Final   Calcium, Ion 09/23/2022 1.06 (L)  1.15 - 1.40 mmol/L Final   HCT 09/23/2022 40.0  39.0 - 52.0 % Final   Hemoglobin  09/23/2022 13.6  13.0 - 17.0 g/dL Final   Sample type 30/86/5784 VENOUS   Final   Glucose-Capillary 09/23/2022 276 (H)  70 - 99 mg/dL Final   Glucose reference range applies only to samples taken after fasting for at least 8 hours.   Comment 1 09/23/2022 Notify RN   Final   pH, Ven 09/23/2022 7.480 (H)  7.25 - 7.43 Final   pCO2, Ven 09/23/2022 34.3 (L)  44 - 60 mmHg Final   pO2, Ven 09/23/2022 53 (H)  32 - 45 mmHg Final   Bicarbonate 09/23/2022 25.5  20.0 - 28.0 mmol/L Final   TCO2 09/23/2022 27  22 - 32 mmol/L Final   O2 Saturation 09/23/2022 90  % Final   Acid-Base Excess 09/23/2022 2.0  0.0 - 2.0 mmol/L Final   Sodium 09/23/2022 133 (L)  135 - 145 mmol/L Final   Potassium 09/23/2022 3.5  3.5 - 5.1 mmol/L Final   Calcium, Ion 09/23/2022 1.14 (L)  1.15 - 1.40 mmol/L Final   HCT 09/23/2022 42.0  39.0 - 52.0 % Final   Hemoglobin 09/23/2022 14.3  13.0 - 17.0 g/dL Final   Sample type 69/62/9528 VENOUS   Final   Glucose-Capillary 09/24/2022 239 (H)  70 - 99 mg/dL Final   Glucose reference range applies only to samples taken after fasting for at least 8 hours.   Comment 1 09/24/2022 Notify RN   Final   Comment 2 09/24/2022 Document in Chart   Final  Office Visit on 05/01/2022  Component Date Value Ref Range Status   Glucose 05/01/2022 275 (H)  70 - 99 mg/dL Final   BUN 41/32/4401 12  6 - 24 mg/dL Final   Creatinine, Ser 05/01/2022 0.66 (L)  0.76 -  1.27 mg/dL Final   eGFR 16/02/9603 110  >59 mL/min/1.73 Final   BUN/Creatinine Ratio 05/01/2022 18  9 - 20 Final   Sodium 05/01/2022 137  134 - 144 mmol/L Final   Potassium 05/01/2022 4.0  3.5 - 5.2 mmol/L Final   Chloride 05/01/2022 98  96 - 106 mmol/L Final   CO2 05/01/2022 21  20 - 29 mmol/L Final   Calcium 05/01/2022 9.6  8.7 - 10.2 mg/dL Final   Hgb V4U MFr Bld 05/01/2022 7.1 (H)  4.8 - 5.6 % Final   Comment:          Prediabetes: 5.7 - 6.4          Diabetes: >6.4          Glycemic control for adults with diabetes: <7.0    Est.  average glucose Bld gHb Est-m* 05/01/2022 157  mg/dL Final    Allergies: Patient has no known allergies.  Medications:  PTA Medications  Medication Sig   lisinopril (ZESTRIL) 20 MG tablet Take 1 tablet (20 mg total) by mouth daily.   atorvastatin (LIPITOR) 40 MG tablet Take 1 tablet (40 mg total) by mouth daily.   acetaminophen (TYLENOL) 500 MG tablet Take 500 mg by mouth every 6 (six) hours as needed for headache.   metFORMIN (GLUCOPHAGE) 500 MG tablet Take 1.5 tablets (750 mg total) by mouth 2 (two) times daily with a meal.    Long Term Goals: Improvement in symptoms so as ready for discharge  Short Term Goals: Patient will verbalize feelings in meetings with treatment team members., Patient will attend at least of 50% of the groups daily., Pt will complete the PHQ9 on admission, day 3 and discharge., Patient will participate in completing the [place] Suicide Severity Rating Scale, Patient will score a low risk of violence for 24 hours prior to discharge, and Patient will take medications as prescribed daily.  Medical Decision Making  Recommend admission to [facility] for substance abuse treatment and stabilization  Reviewed Labs resulted in last 24 hrs. CMP with abnormal values ;Liver enzymes elevated, Sodium 133.   A1c 10.8.  Initiate Prn CIWA protocol-alcohol abuse -lorazepam 1 mg every 6 hours prn for CIWA >10 -thiamine 100 mg daily for nutritional supplementation -hydroxyzine 25 mg every 6 hours prn for anxiety, CIWA < or = 10 -ondansetron 4 mg ODT every 6 hours prn nausea/vomiting -loperamide 2-4 mg capsule prn diarrhea or loose stools   SSI protocol-hyperglycemia/Type 2 diabetes -Metformin 750 mg PO BID with meals  for Type 2 diabetes  Other prns -Tylenol 650 mg p.o. every 6 hours as needed for pain -Maalox 30 mL p.o. every 4 hours as needed for indigestion -MOM -Daily MVI with mineral for nutritional support  Recommendations  Based on my evaluation the patient does not  appear to have an emergency medical condition.  Patient admitted to [facility] for substance abuse treatment and stabilization.   Mancel Bale, NP 09/27/22  2:02 AM

## 2022-09-27 NOTE — Group Note (Deleted)
Group Topic: Decisional Balance/Substance Abuse  Group Date: 09/27/2022 Start Time: 0130 End Time: 0230 Facilitators: Lenny Pastel  Department: [facility]  Number of Participants: 3  Group Focus: acceptance, activities of daily living skills, chemical dependency issues, communication, coping skills, family, feeling awareness/expression, goals/reality orientation, personal responsibility, problem solving, relapse prevention, safety plan, self-awareness, and self-esteem Treatment Modality:  Behavior Modification Therapy and Spiritual Interventions utilized were clarification, exploration, story telling, and support Purpose: enhance coping skills, explore maladaptive thinking, express feelings, improve communication skills, increase insight, regain self-worth, reinforce self-care, relapse prevention strategies, and trigger / craving management  Name: Derek Cruz Date of Birth: 1965-07-06  MR: [id number]    Level of Participation: active Quality of Participation: cooperative Interactions with others: gave feedback Mood/Affect: appropriate Triggers (if applicable): patient was not able to identify triggers at this time.  Cognition: concrete and goal directed Progress: Gaining insight Response: Patient reports he enjoyed listening to the inspirational video on today. Patient reports it challenges him to think about the goals he has set for himself in te past. 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 However, feels like with his setbacks, he is not worthy to carry such load. LCSW provided brief supportive counseling to the patient and he was receptive to the feedback provided. Patient reports he wants to do better with his relationships with his daughters and looks forward to making these changes.  Plan: referral / recommendations  Patients Problems:  Patient  Active Problem List   Diagnosis Date Noted   Elevated liver enzymes    Alcohol abuse    Liver failure (HCC) 05/07/2020   Alcoholic hepatitis    Hyponatremia    Polycythemia    Hemoglobin A1c less than 7.0% 07/16/2019   Neck pain 03/31/2019   Muscle strain 03/31/2019   Undifferentiated abdominal pain 03/31/2019   Gastroesophageal reflux disease without esophagitis 11/24/2018   Chest discomfort 11/24/2018   Essential hypertension 11/24/2018   Pre-diabetes 11/24/2018   Hyperlipidemia 10/06/2016   Chest pain 09/26/2016   Diabetes mellitus type 2, controlled (HCC) 09/26/2016

## 2022-09-27 NOTE — Discharge Planning (Signed)
LCSW and MD attempted to get in contact with the patient's wife Daphine Deutscher [phone], however received no answer. LCSW left non-emergency voice message left at 10:31AM requesting phone call back. No other needs to report at this time.   Fernande Boyden, LCSW Clinical Social Worker [facility] Ph: [phone]
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
## 2022-09-27 NOTE — ED Notes (Signed)
Pt asleep at this hour. No apparent distress. RR even and unlabored. Monitored for safety.  

## 2022-09-27 NOTE — ED Notes (Signed)
Pt sleeping@this time. Breathing even and unlabored. Will continue to monitor for safety 

## 2022-09-27 NOTE — Discharge Instructions (Addendum)
Dear Derek Cruz,  It was a pleasure to take care of you during your stay at [facility] where you were treated for your Alcohol abuse.  While you were here, you were:  observed and cared for by our nurses and nursing assistants  treated with medications by your psychiatrists  evaluated with imaging / lab tests, and treated with medicines / procedures by your doctors  provided individual and group therapy by therapists  provided resources by our social workers and case managers  Please review the medication list provided to you at discharge and stop, start taking, or continue taking the medications listed there.  You should also follow-up with your primary care doctor, or start seeing one if you don't have one yet. If applicable, here are some scheduled follow-ups for you:  Follow-up Information     [facility] [employer]. Go to.   Specialty: Professional Counselor Why: Patient advised to follow up for outpatient substance abuse counseling. Walk in hours are Monday-Friday 9:00am-1:00pm in [place]. Please present photo ID to front desk. Contact information: [facility] [address] [place] [address] [phone]                  I recommend abstinence from alcohol, tobacco, and other illicit drug use.   If your psychiatric symptoms or suicidal thoughts recur, worsen, or if you have side effects to your psychiatric medications, call your outpatient psychiatric provider, 911, [phone] or go to the nearest emergency department.  Take care!  Signed: Augusto Gamble, MD 09/28/2022, 2:28 PM  Naloxone (Narcan) can help reverse an overdose when given to the victim quickly.   offers free naloxone kits and instructions/training on its use.  Add naloxone to your first aid kit and you can help save a life. A prescription can be filled at your local pharmacy or free kits are provided by the county.  Pick up your  free kit at the following locations:   [place]:  [facility], [address] [place] [address] [phone]) [facility] [address] [employer] [place] [address] [phone]) [facility] Detention center [address] [place] [address]  [place]: [facility] [address] [place] [address] ([phone]) [facility] [address] [place] [address] [phone])  [facility] [address], [place], [address] [phone] phone  New Patient Assessment/Therapy Walk-Ins:  Monday and Wednesday: 8 am until slots are full. Every 1st and 2nd Fridays of the month: 1 pm - 5 pm.  NO ASSESSMENT/THERAPY WALK-INS ON TUESDAYS OR THURSDAYS  New Patient Assessment/Medication Management Walk-Ins:  Monday - Friday:  8 am - 11 am.  For all walk-ins, we ask that you arrive by 7:30 am because patients will be seen in the order of arrival.  Availability is limited; therefore, you may not be seen on the same day that you walk-in.  Our goal is to serve and meet the needs of our community to the best of our ability.  SUBSTANCE USE TREATMENT for Medicaid and State Funded/IPRS  [facility] [address], [place], [address] [phone] phone NOTE: [facility] is no longer offering IOP services.  Serves those who are low-income or have no insurance.  [facility] [address], [place], [place], [address] [phone] phone [phone] fax NOTE: Does have Substance  Abuse-Intensive Outpatient Program [facility]) as well as transitional housing if eligible.  [facility] [address] [place], [place], [address] [phone] phone [phone] fax  [facility] [phone] [address] [place], [place], [address] [phone] phone [phone] fax  HALFWAY  HOUSES:  [facility] [phone]  [employer].oxfordvacancies.com  12 STEP PROGRAMS:  Alcoholics Anonymous of [place] [employer]et.be  Narcotics Anonymous of [place] [employer]dk  Al-Anon of [place], [place] www.[place]alanon.org/find-meetings.html  Nar-Anon https://nar-anon.org/find-a-meetin  List of Residential placements:   [facility] in [place]: [phone]  [facility]: [phone]  [place], [place] [phone]: Male and male facility; 30-day program: (uninsured and Medicaid such as Laurena Bering, [place], [place], partners)  [facility]: [phone]; men and women's facility; 28 days; Can have Medicaid tailored plan Tour manager or Partners)  [facility]: [phone] Karoline Caldwell or Larita Fife; 28 day program; must be fully detox; tailored Medicaid or no insurance  [address] in [place], [place]; [phone]; 28 day all males program; no insurance accepted  [facility] in [place]: Gabriel Rung [phone] (no insurance or Medicaid only); 90 days; outpatient services but provide housing in apartments [place]  [facility] Admission: [phone]: Patient must complete phone screening for placement: [place], [place]; 6 month program; uninsured, Medicaid, and [employer].   [facility]: no insurance required; [phone]  [facility]: [phone]; Intake: Molly Maduro; Must fill out application online; Alecia Lemming Delay [phone] x [address] [facility] in [place], [place]: [phone]; Admissions Coordinators Mr. Maurine Minister or Barron Alvine; 90 day program.  [facility]: [place], [place] [phone]; Co-Ed 9 month to a year program; Online application; Men entry fee is $500 (6-55months);  [employer]: [address] [place], [place] [address]; no fee or insurance required; minimum of 2 years;  Highly structured; work based; Intake Coordinator is Thayer Ohm [phone]  [facility] in [place], [place]: [phone]; Fax number is [phone]; website: www.[facility].org; Requires 3-6 page autobiography; 2 year program (18 months and then 46month transitional housing); Admission fee is $300; no insurance needed; work Automotive engineer in [place], [place]: [employer] Desk Staff: Danise Edge [phone]: They have a Men's Regenerations Program 6-54months. Free program; There is an initial $300 fee however, they are willing to work with patients regarding that. Application is online.  [facility]: Admissions [phone] Doran Heater ext [phone]; Any 7-90 day program is out of pocket; 12 month program is free of charge; there is a $275 entry fee; Patient is responsible for own transportation  Follow up at [facility] -  [address] [place], [place], [place] [address]

## 2022-09-27 NOTE — Group Note (Signed)
Group Topic: Relapse and Recovery  Group Date: 09/27/2022 Start Time: 0800 End Time: 0900 Facilitators: Rae Lips B  Department: [facility]  Number of Participants: 3  Group Focus: chemical dependency issues Treatment Modality:  Leisure Development Interventions utilized were leisure development Purpose: relapse prevention strategies  Name: Nichalas Shupe Date of Birth: 04-Jul-1965  MR: [id number]    Level of Participation: active Quality of Participation: attentive Interactions with others: gave feedback Mood/Affect: appropriate Triggers (if applicable): NA Cognition: coherent/clear and insightful Progress: Gaining insight Response: NA Plan: patient will be encouraged to keep going to groups.  Patients Problems:  Patient Active Problem List   Diagnosis Date Noted   Elevated liver enzymes    Alcohol abuse    Liver failure (HCC) 05/07/2020   Alcoholic hepatitis    Hyponatremia    Polycythemia    Hemoglobin A1c less than 7.0% 07/16/2019   Neck pain 03/31/2019   Muscle strain 03/31/2019   Undifferentiated abdominal pain 03/31/2019   Gastroesophageal reflux disease without esophagitis 11/24/2018   Chest discomfort 11/24/2018   Essential hypertension 11/24/2018   Pre-diabetes 11/24/2018   Hyperlipidemia 10/06/2016   Chest pain 09/26/2016   Diabetes mellitus type 2, controlled (HCC) 09/26/2016

## 2022-09-28 ENCOUNTER — Other Ambulatory Visit: Payer: Self-pay

## 2022-09-28 DIAGNOSIS — F1020 Alcohol dependence, uncomplicated: Secondary | ICD-10-CM | POA: Diagnosis not present

## 2022-09-28 LAB — GLUCOSE, CAPILLARY
Glucose-Capillary: 255 mg/dL — ABNORMAL HIGH (ref 70–99)
Glucose-Capillary: 324 mg/dL — ABNORMAL HIGH (ref 70–99)
Glucose-Capillary: 309 mg/dL — ABNORMAL HIGH (ref 70–99)
Glucose-Capillary: 311 mg/dL — ABNORMAL HIGH (ref 70–99)

## 2022-09-28 MED ORDER — INSULIN GLARGINE-YFGN 100 UNIT/ML ~~LOC~~ SOLN
15.0000 [IU] | Freq: Every day | SUBCUTANEOUS | Status: DC
  Administered 2022-09-28: 15 [IU] via SUBCUTANEOUS

## 2022-09-28 MED ORDER — INSULIN GLARGINE-YFGN 100 UNIT/ML ~~LOC~~ SOLN: 20.0000 [IU] | Freq: Every day | SUBCUTANEOUS | Status: DC

## 2022-09-28 MED ORDER — LISINOPRIL 20 MG PO TABS
20.0000 mg | ORAL_TABLET | Freq: Every day | ORAL | Status: DC
  Administered 2022-09-28 – 2022-09-29 (×2): 20 mg via ORAL
  Filled 2022-09-28 (×2): qty 1

## 2022-09-28 NOTE — Group Note (Signed)
Group Topic: Balance in Life  Group Date: 09/28/2022 Start Time: 0130 End Time: 0215 Facilitators: Lenny Pastel  Department: [facility]  Number of Participants: 4  Group Focus: activities of daily living skills, chemical dependency issues, clarity of thought, communication, coping skills, daily focus, feeling awareness/expression, healthy friendships, impulsivity, leisure skills, personal responsibility, problem solving, relapse prevention, self-awareness, self-esteem, social skills, and substance abuse education Treatment Modality:  Cognitive Behavioral Therapy Interventions utilized were assignment, exploration, group exercise, mental fitness, problem solving, and support Purpose: enhance coping skills, explore maladaptive thinking, express feelings, express irrational fears, improve communication skills, increase insight, regain self-worth, reinforce self-care, relapse prevention strategies, and trigger / craving management  Name: Derek Cruz Date of Birth: [birth year]  MR: [id number]    Level of Participation: active Quality of Participation: cooperative Interactions with others: gave feedback Mood/Affect: appropriate Triggers (if applicable): N/A Cognition: coherent/clear and goal directed Progress: Gaining insight Response: Patient actively participated in group on today. Group started off with introductions and group rules. Group members participated in a therapeutic activity that required active listening and communication skills. Group members were able to identify similarities and differences within the group. Patient interacted positively with staff and peers. No issues to report.  Plan: referral / recommendations  Patients Problems:  Patient Active Problem List   Diagnosis Date Noted   Elevated liver enzymes    Alcohol abuse    Liver failure (HCC) 05/07/2020   Alcoholic hepatitis    Hyponatremia    Polycythemia     Hemoglobin A1c less than 7.0% 07/16/2019   Neck pain 03/31/2019   Muscle strain 03/31/2019   Undifferentiated abdominal pain 03/31/2019   Gastroesophageal reflux disease without esophagitis 11/24/2018   Chest discomfort 11/24/2018   Essential hypertension 11/24/2018   Pre-diabetes 11/24/2018   Hyperlipidemia 10/06/2016   Chest pain 09/26/2016   Diabetes mellitus type 2, controlled (HCC) 09/26/2016

## 2022-09-28 NOTE — ED Notes (Signed)
Pt is in the bed resting. Reported had 1 vomiting episode.  Respirations are even and unlabored. No acute distress noted. Will continue to monitor for safety.

## 2022-09-28 NOTE — ED Notes (Signed)
Pt asleep at this hour. No apparent distress. RR even and unlabored. Monitored for safety.  

## 2022-09-28 NOTE — ED Provider Notes (Cosign Needed Addendum)
[facility] Progress Note  Date: 09/28/22 Patient Name: Derek Cruz MRN: [id number] Chief Complaint:  Chief Complaint  Patient presents with   Addiction Problem      Diagnosis:  Final diagnoses:  Alcohol abuse   Subjective: Patient reports improvement in withdrawal symptoms, and states that he "feeling better" today. He denies any noticeable tremors. He states that was sweating again last night while sleeping but none this morning. He otherwise states that he slept well last night. He also notes some dizziness this morning. This is present with laying down and standing. He also notes some blurry vision; however, he denies any headaches, chest pain, palpitations. Continues to endorse some mild abdominal pain in the LUQ/epigastric region, but this is manageable with current PRN medications. He has been having regular bowel movements which are improved from his prior diarrhea throughout this admission.   Please see prior notes for robust social history involving patient's current admission.   Collateral information obtained Lenord Carbo, patient's wife) Called wife in the presence of Fernande [place], [place] and with a Spanish interpreter on the line.  Wife states her concerns are related to patient resuming use of alcohol again. Prior to the suicidal statements on admission, he has never expressed suicidal threats before or attempted suicide. Per Lenord Carbo, patient is not violent towards her or towards their children. She reports he owns the house. She discloses no firearms in the home.  During this conversation, I answered questions pertaining to the patient's current treatment and provided updates, outlined the treatment plan moving forward, coordinated plans for future disposition and recommended follow-up, and directed involved parties to available resources in the event of patient decompensating.  Augusto Gamble, MD 5/10/202410:19 AM  Total Time spent with patient: 45  minutes  Mental Status Exam  Appearance and Grooming: Patient appears overall clean.   Behavior: The patient appears in no acute distress, and during the interview, was calm, focused, required minimal redirection, and behaving appropriately to scenario. He was able to follow commands and compliant to requests and made good eye contact.   The patient did not appear internally or externally preoccupied.   Attitude: Patient's attitude towards the interviewer and other individuals present was cooperative and open.   Motor activity:  No noted tremulous movement this morning. There was no notable abnormal facial movements and no notable abnormal extremity movements.   Speech: The volume of his speech was within an appropriate range and normal in quantity. The rate was appropriately paced with a normal rhythm. Responses were normal in latency. There was no abnormal patterns in speech.   Mood: "Feeling better, happy"   Affect: His affect is appropriate for the topic of conversation. ------------------------------------------------------------------------------------------------------------------------- Perception The patient describes no hallucinations.   Thought Content The patient describes no delusional thoughts; he denies thought insertion, withdrawal, interruption, or broadcasting.   Patient at the time of interview denies passive or active suicidal ideations. He denies homicidal intent.   Thought Process The patient's thought process is linear and is goal-directed.   Insight The patient at the time of interview demonstrates fair insight, as evidenced by understanding of mental health condition/s, acknowledgement of substance use disorder/s, ability to identify trigger/s causing mental health decompensation, and inability to identify adaptive and maladaptive coping strategies.   Judgement The patient over the past 24 hours demonstrates fair judgement, as evidenced by  help-seeking behavior, such as voluntary admission to mental health facility.  ROS and Physical Exam  Review of Systems  Constitutional: Negative.  Negative for fever.  Respiratory: Negative.  Negative for shortness of breath.   Cardiovascular: Negative.  Negative for chest pain and palpitations.  Gastrointestinal:  Positive for abdominal pain (LUQ/epigastrium, mild). Negative for constipation and diarrhea.  Genitourinary: Negative.   Neurological:  Positive for dizziness. Negative for headaches.  Psychiatric/Behavioral:  Negative for hallucinations and suicidal ideas.   All other systems reviewed and are negative.  Blood pressure (!) 166/109, pulse 81, temperature 98.3 F (36.8 C), temperature source Oral, resp. rate 18, SpO2 100 %. There is no height or weight on file to calculate BMI. Physical Exam Vitals and nursing note reviewed.  Constitutional:      Appearance: Normal appearance.  HENT:     Head: Normocephalic and atraumatic.     Mouth/Throat:     Pharynx: Oropharynx is clear.  Eyes:     Extraocular Movements: Extraocular movements intact.  Cardiovascular:     Rate and Rhythm: Normal rate.  Pulmonary:     Effort: Pulmonary effort is normal.  Abdominal:     General: Abdomen is flat. There is no distension.     Palpations: Abdomen is soft.     Tenderness: There is no abdominal tenderness.  Musculoskeletal:        General: Normal range of motion.     Cervical back: Normal range of motion.  Skin:    General: Skin is warm and dry.  Neurological:     General: No focal deficit present.     Mental Status: He is alert.  Psychiatric:        Attention and Perception: Attention and perception normal. He is attentive. He does not perceive auditory or visual hallucinations.        Mood and Affect: Mood and affect normal.        Speech: Speech normal.        Behavior: Behavior normal. Behavior is not agitated. Behavior is cooperative.        Thought Content: Thought content  normal. Thought content does not include homicidal or suicidal ideation.        Cognition and Memory: Cognition and memory normal.        Judgment: Judgment normal.    Assets  Assets:Communication Skills; Desire for Improvement; Social Support; Resilience   Past Medical History:  Past Medical History:  Diagnosis Date   Alcohol abuse    Chest pain    Dyslipidemia    Elevated liver enzymes    Gastritis    Hypertension    No past surgical history on file.  Family History:  Family History  Problem Relation Age of Onset   Diabetes Mellitus II Neg Hx    Stomach cancer Neg Hx    Colon cancer Neg Hx    Social History:  Social History   Socioeconomic History   Marital status: Married    Spouse name: Not on file   Number of children: Not on file   Years of education: Not on file   Highest education level: Not on file  Occupational History   Not on file  Tobacco Use   Smoking status: Former    Passive exposure: Past   Smokeless tobacco: Never  Vaping Use   Vaping Use: Never used  Substance and Sexual Activity   Alcohol use: Not Currently    Comment: 6 pack a day, none now 06-07-2020   Drug use: No   Sexual activity: Not on file  Other Topics Concern   Not on file  Social History Narrative  Not on file   Social Determinants of Health   Financial Resource Strain: Not on file  Food Insecurity: No Food Insecurity (09/27/2022)   Hunger Vital Sign    Worried About Running Out of Food in the Last Year: Never true    Ran Out of Food in the Last Year: Never true  Transportation Needs: No Transportation Needs (09/27/2022)   PRAPARE - Administrator, Civil Service (Medical): No    Lack of Transportation (Non-Medical): No  Physical Activity: Not on file  Stress: Not on file  Social Connections: Not on file  Intimate Partner Violence: Not At Risk (09/27/2022)   Humiliation, Afraid, Rape, and Kick questionnaire    Fear of Current or Ex-Partner: No    Emotionally  Abused: No    Physically Abused: No    Sexually Abused: No   SDOH:  SDOH Screenings   Food Insecurity: No Food Insecurity (09/27/2022)  Housing: Low Risk  (09/27/2022)  Transportation Needs: No Transportation Needs (09/27/2022)  Utilities: Not At Risk (09/27/2022)  Depression (PHQ2-9): Low Risk  (09/26/2022)  Tobacco Use: Medium Risk (09/26/2022)    ASSESSMENT  Kaedin Ace Worman is a 57 y/o male w/ hx of type 2 diabetes, dyslipidemia, GERD, severe alcohol use disorder c/b alcoholic hepatitis and liver failure admitted to [facility] for alcohol detox.  Patient continues to benefit from observation at [facility] due to risk of medical instability due to the effects of substance withdrawal.   Problem List: #Alcohol use disorder #Elevated liver enzymes Patient reports alcohol use starting 3 months ago. He reportedly drinks 8-10 beers per day and last drink was Monday (5/6). He denies any prior history of alcohol withdrawal seizures, hallucinations, or DTs. On admission, liver enzymes elevated (AST 161, ALT 185) but does not show alcoholic pattern given AST:ALT ratio < 2. Hepatitis panel negative. - continues to feel improved overall - continue CIWA protocol with PRN lorazepam, most recent score of 2 this morning.  - continue lorazepam taper - continue multivitamin, thiamine 100 mg daily for nutritional supplementation   #DM II Patient's last HbA1c 10.8 on 09/25/22. Most recent CBGs in mid to high 200s - Increase semglee to 15U daily, continue SSI - continue metformin 1,500 mg 24-hr tablet daily with breakfast  #Dizziness, blurred vision Likely related to residual increased sympathetic over-activation iso ongoing EtOH withdrawal. Pressures also elevated this morning, but not into severe ranges and he denies presence of headache at this time. No focal deficits appreciated on observed exam this morning. Vitals otherwise stable. Negative for orthostatic hypotension - Restart  lisinopril as below, will CTM throughout today  Other chronic conditions: Primary hypertension - Restart lisinopril given BP significantly elevated this AM to 166/109, may be due to alcohol withdrawal versus or as apart of superimposed cHTN  Dyslipidemia - continue home atorvastatin 40 mg daily GERD - continue pantoprazole 40 mg daily  PRN medications for symptomatic management: -- continue acetaminophen 650 mg every 6 hours as needed for mild to moderate pain, fever, and headaches -- continue hydroxyzine 25 mg three times a day as needed for anxiety -- continue bismuth subsalicylate 524 mg oral chewable tablet every 3 hours as needed for diarrhea / loose stools -- continue senna 8.6 mg oral at bedtime and polyethylene glycol 17 g oral daily as needed for mild to moderate constipation -- continue ondansetron 8 mg every 8 hours as needed for nausea or vomiting -- continue aluminum-magnesium hydroxide + simethicone 30 mL every 4 hours  as needed for heartburn or indigestion   Short Term Goal: stabilize patient until a safe disposition can be secured. Will continue to work closely with SW to find options regarding disposition.    Disposition: TBD   I discussed my assessment, planned testing and intervention for the patient with Dr. Lucianne Muss who agrees with my formulated course of action.  Signed: Rosario Adie, MS3 [facility]  09/28/22 8:12 AM   Signed: Bary Leriche, MD [facility] Physician 09/28/2022 10:31 AM

## 2022-09-28 NOTE — Progress Notes (Signed)
Pt CIWA=2 this morning. Pt endorsing sweating during the night. Says he slept better last night.

## 2022-09-28 NOTE — ED Notes (Signed)
Patient complained of headache given tylenol. Patient now has additional complaint of dizziness. Patient is sitting quietly. Patient vitals taken BP 149/99, Pulse 90, Respiration 19, Temp 98.5, and O2 100.

## 2022-09-28 NOTE — ED Notes (Addendum)
Patient denies SI,HI,AVH. Morning and afternoon medication administered without difficulty to patient. Patient is cooperative and interacts well with staff. Patient shows signs of anxiousness and tremors from ETOH withdrawal. Patient vital signs was taken and CIWA score 2. No complaints at present. Will continue to monitor for safety.
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
## 2022-09-28 NOTE — Group Note (Signed)
Group Topic: Relapse and Recovery  Group Date: 09/28/2022 Start Time: 0740 End Time: 0800 Facilitators: Rae Lips B  Department: [facility]  Number of Participants: 5  Group Focus: acceptance Treatment Modality:  Leisure Development Interventions utilized were support Purpose: express feelings  Name: Derek Cruz Date of Birth: 02-10-1966  MR: [id number]    Level of Participation: active Quality of Participation: attentive Interactions with others: gave feedback Mood/Affect: appropriate Triggers (if applicable): NA Cognition: coherent/clear Progress: Gaining insight Response: NA Plan: patient will be encouraged to keep going to groups  Patients Problems:  Patient Active Problem List   Diagnosis Date Noted   Elevated liver enzymes    Alcohol abuse    Liver failure (HCC) 05/07/2020   Alcoholic hepatitis    Hyponatremia    Polycythemia    Hemoglobin A1c less than 7.0% 07/16/2019   Neck pain 03/31/2019   Muscle strain 03/31/2019   Undifferentiated abdominal pain 03/31/2019   Gastroesophageal reflux disease without esophagitis 11/24/2018   Chest discomfort 11/24/2018   Essential hypertension 11/24/2018   Pre-diabetes 11/24/2018   Hyperlipidemia 10/06/2016   Chest pain 09/26/2016   Diabetes mellitus type 2, controlled (HCC) 09/26/2016

## 2022-09-28 NOTE — ED Notes (Signed)
Received patient this AM. Patient in day room interacting with peers. Patient respirations are even and unlabored. Will continue to monitor for safety.

## 2022-09-29 ENCOUNTER — Emergency Department (HOSPITAL_COMMUNITY): Payer: Self-pay

## 2022-09-29 ENCOUNTER — Encounter (HOSPITAL_COMMUNITY): Payer: Self-pay | Admitting: Emergency Medicine

## 2022-09-29 ENCOUNTER — Other Ambulatory Visit (HOSPITAL_COMMUNITY)
Admission: EM | Admit: 2022-09-29 | Discharge: 2022-10-01 | Disposition: A | Discharge status: Home / Self Care | Payer: Self-pay | Source: Home / Self Care | Admitting: Nurse Practitioner

## 2022-09-29 ENCOUNTER — Other Ambulatory Visit: Payer: Self-pay

## 2022-09-29 ENCOUNTER — Emergency Department (HOSPITAL_COMMUNITY)
Admission: EM | Admit: 2022-09-29 | Discharge: 2022-09-29 | Disposition: A | Discharge status: Other Acute Inpatient Hospital | Payer: Self-pay | Attending: Emergency Medicine | Admitting: Emergency Medicine

## 2022-09-29 DIAGNOSIS — R0789 Other chest pain: Secondary | ICD-10-CM | POA: Insufficient documentation

## 2022-09-29 DIAGNOSIS — Z79899 Other long term (current) drug therapy: Secondary | ICD-10-CM | POA: Insufficient documentation

## 2022-09-29 DIAGNOSIS — F1020 Alcohol dependence, uncomplicated: Secondary | ICD-10-CM | POA: Insufficient documentation

## 2022-09-29 DIAGNOSIS — E119 Type 2 diabetes mellitus without complications: Secondary | ICD-10-CM | POA: Insufficient documentation

## 2022-09-29 DIAGNOSIS — E1165 Type 2 diabetes mellitus with hyperglycemia: Secondary | ICD-10-CM

## 2022-09-29 DIAGNOSIS — I10 Essential (primary) hypertension: Secondary | ICD-10-CM | POA: Insufficient documentation

## 2022-09-29 DIAGNOSIS — R202 Paresthesia of skin: Secondary | ICD-10-CM | POA: Insufficient documentation

## 2022-09-29 DIAGNOSIS — K219 Gastro-esophageal reflux disease without esophagitis: Secondary | ICD-10-CM | POA: Insufficient documentation

## 2022-09-29 DIAGNOSIS — R200 Anesthesia of skin: Secondary | ICD-10-CM

## 2022-09-29 DIAGNOSIS — F1010 Alcohol abuse, uncomplicated: Secondary | ICD-10-CM

## 2022-09-29 DIAGNOSIS — Z7984 Long term (current) use of oral hypoglycemic drugs: Secondary | ICD-10-CM | POA: Insufficient documentation

## 2022-09-29 LAB — CBC
HCT: 43.1 % (ref 39.0–52.0)
Hemoglobin: 15.1 g/dL (ref 13.0–17.0)
MCH: 32 pg (ref 26.0–34.0)
MCHC: 35 g/dL (ref 30.0–36.0)
MCV: 91.3 fL (ref 80.0–100.0)
Platelets: 157 10*3/uL (ref 150–400)
RBC: 4.72 MIL/uL (ref 4.22–5.81)
RDW: 13.3 % (ref 11.5–15.5)
WBC: 5.7 10*3/uL (ref 4.0–10.5)
nRBC: 0 % (ref 0.0–0.2)

## 2022-09-29 LAB — DIFFERENTIAL
Abs Immature Granulocytes: 0.02 10*3/uL (ref 0.00–0.07)
Basophils Absolute: 0 10*3/uL (ref 0.0–0.1)
Basophils Relative: 1 %
Eosinophils Absolute: 0.1 10*3/uL (ref 0.0–0.5)
Eosinophils Relative: 1 %
Immature Granulocytes: 0 %
Lymphocytes Relative: 15 %
Lymphs Abs: 0.8 10*3/uL (ref 0.7–4.0)
Monocytes Absolute: 0.7 10*3/uL (ref 0.1–1.0)
Monocytes Relative: 12 %
Neutro Abs: 4.1 10*3/uL (ref 1.7–7.7)
Neutrophils Relative %: 71 %

## 2022-09-29 LAB — COMPREHENSIVE METABOLIC PANEL
ALT: 161 U/L — ABNORMAL HIGH (ref 0–44)
AST: 126 U/L — ABNORMAL HIGH (ref 15–41)
Albumin: 3.7 g/dL (ref 3.5–5.0)
Alkaline Phosphatase: 115 U/L (ref 38–126)
Anion gap: 9 (ref 5–15)
BUN: 11 mg/dL (ref 6–20)
CO2: 23 mmol/L (ref 22–32)
Calcium: 9.2 mg/dL (ref 8.9–10.3)
Chloride: 101 mmol/L (ref 98–111)
Creatinine, Ser: 0.65 mg/dL (ref 0.61–1.24)
GFR, Estimated: >60 mL/min (ref >60)
Glucose, Bld: 206 mg/dL — ABNORMAL HIGH (ref 70–99)
Potassium: 3.7 mmol/L (ref 3.5–5.1)
Sodium: 133 mmol/L — ABNORMAL LOW (ref 135–145)
Total Bilirubin: 0.7 mg/dL (ref 0.3–1.2)
Total Protein: 7 g/dL (ref 6.5–8.1)

## 2022-09-29 LAB — TROPONIN I (HIGH SENSITIVITY)
Troponin I (High Sensitivity): 6 ng/L (ref <18)
Troponin I (High Sensitivity): 5 ng/L (ref <18)

## 2022-09-29 LAB — MAGNESIUM: Magnesium: 2.2 mg/dL (ref 1.7–2.4)

## 2022-09-29 LAB — CBG MONITORING, ED: Glucose-Capillary: 225 mg/dL — ABNORMAL HIGH (ref 70–99)

## 2022-09-29 LAB — ETHANOL: Alcohol, Ethyl (B): <10 mg/dL (ref <10)

## 2022-09-29 LAB — GLUCOSE, CAPILLARY: Glucose-Capillary: 305 mg/dL — ABNORMAL HIGH (ref 70–99)

## 2022-09-29 MED ORDER — INSULIN GLARGINE-YFGN 100 UNIT/ML ~~LOC~~ SOLN
15.0000 [IU] | Freq: Every day | SUBCUTANEOUS | Status: DC
  Administered 2022-09-29: 15 [IU] via SUBCUTANEOUS

## 2022-09-29 MED ORDER — THIAMINE MONONITRATE 100 MG PO TABS
100.0000 mg | ORAL_TABLET | Freq: Every day | ORAL | Status: DC
  Administered 2022-09-30 – 2022-10-01 (×2): 100 mg via ORAL
  Filled 2022-09-29 (×2): qty 1

## 2022-09-29 MED ORDER — LORAZEPAM 1 MG PO TABS: 1.0000 mg | ORAL_TABLET | Freq: Four times a day (QID) | ORAL | Status: DC | PRN

## 2022-09-29 MED ORDER — GADOBUTROL 1 MMOL/ML IV SOLN
9.0000 mL | Freq: Once | INTRAVENOUS | Status: AC | PRN
  Administered 2022-09-29: 9 mL via INTRAVENOUS

## 2022-09-29 MED ORDER — MAGNESIUM HYDROXIDE 400 MG/5ML PO SUSP: 30.0000 mL | Freq: Every day | ORAL | Status: DC | PRN

## 2022-09-29 MED ORDER — LOPERAMIDE HCL 2 MG PO CAPS: 2.0000 mg | ORAL_CAPSULE | ORAL | Status: DC | PRN

## 2022-09-29 MED ORDER — ADULT MULTIVITAMIN W/MINERALS CH
1.0000 | ORAL_TABLET | Freq: Every day | ORAL | Status: DC
  Administered 2022-09-30 – 2022-10-01 (×2): 1 via ORAL
  Filled 2022-09-29 (×2): qty 1

## 2022-09-29 MED ORDER — TRAZODONE HCL 50 MG PO TABS
50.0000 mg | ORAL_TABLET | Freq: Every evening | ORAL | Status: DC | PRN
  Administered 2022-09-30: 50 mg via ORAL
  Filled 2022-09-29: qty 1

## 2022-09-29 MED ORDER — LORAZEPAM 1 MG PO TABS: 1.0000 mg | ORAL_TABLET | Freq: Two times a day (BID) | ORAL | Status: DC

## 2022-09-29 MED ORDER — ALUM & MAG HYDROXIDE-SIMETH 200-200-20 MG/5ML PO SUSP: 30.0000 mL | ORAL | Status: DC | PRN

## 2022-09-29 MED ORDER — ONDANSETRON 4 MG PO TBDP: 4.0000 mg | ORAL_TABLET | Freq: Four times a day (QID) | ORAL | Status: DC | PRN

## 2022-09-29 MED ORDER — LORAZEPAM 1 MG PO TABS
1.0000 mg | ORAL_TABLET | Freq: Four times a day (QID) | ORAL | Status: DC
  Administered 2022-09-29: 1 mg via ORAL
  Filled 2022-09-29: qty 1

## 2022-09-29 MED ORDER — ACETAMINOPHEN 325 MG PO TABS
650.0000 mg | ORAL_TABLET | Freq: Four times a day (QID) | ORAL | Status: DC | PRN
  Administered 2022-09-30 – 2022-10-01 (×2): 650 mg via ORAL
  Filled 2022-09-29 (×2): qty 2

## 2022-09-29 MED ORDER — HYDROXYZINE HCL 25 MG PO TABS
25.0000 mg | ORAL_TABLET | Freq: Four times a day (QID) | ORAL | Status: DC | PRN
  Administered 2022-09-30: 25 mg via ORAL
  Filled 2022-09-29: qty 1

## 2022-09-29 MED ORDER — LORAZEPAM 2 MG/ML IJ SOLN
1.0000 mg | Freq: Once | INTRAMUSCULAR | Status: AC
  Administered 2022-09-29: 1 mg via INTRAVENOUS
  Filled 2022-09-29: qty 1

## 2022-09-29 MED ORDER — LORAZEPAM 1 MG PO TABS: 1.0000 mg | ORAL_TABLET | Freq: Three times a day (TID) | ORAL | Status: DC

## 2022-09-29 MED ORDER — LORAZEPAM 1 MG PO TABS: 1.0000 mg | ORAL_TABLET | Freq: Every day | ORAL | Status: DC

## 2022-09-29 NOTE — ED Notes (Signed)
Patient transported to MRI 

## 2022-09-29 NOTE — ED Notes (Signed)
Pt is in the bed sleeping. Respirations are even and unlabored. No acute distress noted. Will continue to monitor for safety. 

## 2022-09-29 NOTE — ED Notes (Signed)
Patient back to unit - no complaints at this time - will continue to monitor for safety

## 2022-09-29 NOTE — ED Notes (Signed)
Pt to return to [facility] when D/C.
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
## 2022-09-29 NOTE — ED Triage Notes (Addendum)
Pt BIB [facility] from [facility] with reports of numbness to the the left arm that started yesterday at 1600. Pt reports no numbness at this time. Pt reports "it feels like ants in my head." PT reports it feels like "tingling from my forehead to the back of my head." NIHSS 0.

## 2022-09-29 NOTE — ED Notes (Signed)
Safe transport is called, will be here shortly to take patient back to [facility]

## 2022-09-29 NOTE — ED Provider Notes (Signed)
[place] EMERGENCY DEPARTMENT AT [facility] Provider Note   CSN: [id number] Arrival date & time: 09/29/22  1038     History  Chief Complaint  Patient presents with   Numbness    Derek Cruz is a 57 y.o. male.  HPI Patient presents for forehead and scalp numbness and paresthesias.  Medical history includes T2DM, HLD, GERD, prediabetes, alcohol abuse.  He presents to the ED today from [facility].  Per chart review, he was also endorsing to [facility] staff of blurry vision and left arm feeling lazy.  Patient states that, beginning at 5 PM yesterday, he experienced a numbness and tingling sensation to his scalp and forehead.  Symptoms have been bilateral.  He did not endorse any left arm weakness, however, he did state that his vision has seemed yellow.  He denies any blurriness.  Onset of yellow vision was same time yesterday evening.  Additional recent symptoms include chest tightness for the past 2 days.  Patient is currently at [facility] for alcohol abuse.  He states that he has not drink any alcohol since Monday (5 days ago).  He was seen in the ED 3 days ago for medical clearance given his elevated blood sugars.  Glucose at the time peaked at 400.  It improved to 281 while in the ED.  Home dose of metformin was increased.    Home Medications Prior to Admission medications   Medication Sig Start Date End Date Taking? Authorizing Provider  acetaminophen (TYLENOL) 500 MG tablet Take 500 mg by mouth every 6 (six) hours as needed for headache.    [provider]  atorvastatin (LIPITOR) 40 MG tablet Take 1 tablet (40 mg total) by mouth daily. 05/01/22 09/26/22  Rema Fendt, NP  lisinopril (ZESTRIL) 20 MG tablet Take 1 tablet (20 mg total) by mouth daily. 05/01/22 09/26/22  Rema Fendt, NP  metFORMIN (GLUCOPHAGE) 500 MG tablet Take 1.5 tablets (750 mg total) by mouth 2 (two) times daily with a meal. 09/26/22   Small, Brooke L, PA      Allergies    Patient has no known  allergies.    Review of Systems   Review of Systems  Eyes:  Positive for visual disturbance.  Cardiovascular:  Positive for chest pain.  Neurological:  Positive for numbness.  All other systems reviewed and are negative.   Physical Exam Updated Vital Signs BP 118/84   Pulse 79   Temp 98.4 F (36.9 C)   Resp 16   SpO2 94%  Physical Exam Vitals and nursing note reviewed.  Constitutional:      General: He is not in acute distress.    Appearance: Normal appearance. He is well-developed. He is not ill-appearing, toxic-appearing or diaphoretic.  HENT:     Head: Normocephalic and atraumatic.     Right Ear: External ear normal.     Left Ear: External ear normal.     Nose: Nose normal.     Mouth/Throat:     Mouth: Mucous membranes are moist.  Eyes:     Extraocular Movements: Extraocular movements intact.     Conjunctiva/sclera: Conjunctivae normal.  Cardiovascular:     Rate and Rhythm: Normal rate and regular rhythm.     Heart sounds: No murmur heard. Pulmonary:     Effort: Pulmonary effort is normal. No respiratory distress.  Abdominal:     General: There is no distension.     Palpations: Abdomen is soft.     Tenderness: There is  no abdominal tenderness.  Musculoskeletal:        General: No swelling. Normal range of motion.     Cervical back: Normal range of motion and neck supple.     Right lower leg: No edema.     Left lower leg: No edema.  Skin:    General: Skin is warm and dry.     Coloration: Skin is not jaundiced or pale.  Neurological:     Mental Status: He is alert.     Comments: Endorses diminished sensation to bilateral scalp and forehead.  No loss of sensation to V2 or V3 distributions.  No other areas of diminished sensation.  No areas of weakness.  Coordination intact.  Psychiatric:        Mood and Affect: Mood normal.        Behavior: Behavior normal.     ED Results / Procedures / Treatments   Labs (all labs ordered are listed, but only abnormal  results are displayed) Labs Reviewed  COMPREHENSIVE METABOLIC PANEL - Abnormal; Notable for the following components:      Result Value   Sodium 133 (*)    Glucose, Bld 206 (*)    AST 126 (*)    ALT 161 (*)    All other components within normal limits  CBC  DIFFERENTIAL  ETHANOL  MAGNESIUM  TROPONIN I (HIGH SENSITIVITY)  TROPONIN I (HIGH SENSITIVITY)    EKG EKG Interpretation  Date/Time:  Saturday Sep 29 2022 11:04:06 EDT Ventricular Rate:  81 PR Interval:  152 QRS Duration: 90 QT Interval:  368 QTC Calculation: 427 R Axis:   -16 Text Interpretation: Normal sinus rhythm Confirmed by Gloris Manchester ([id number]) on 09/29/2022 2:31:14 PM  Radiology MR Brain W and Wo Contrast  Result Date: 09/29/2022 CLINICAL DATA:  Provided history: Neuro deficit, acute, stroke suspected. EXAM: MRI HEAD WITHOUT AND WITH CONTRAST TECHNIQUE: Multiplanar, multiecho pulse sequences of the brain and surrounding structures were obtained without and with intravenous contrast. CONTRAST:  9mL GADAVIST GADOBUTROL 1 MMOL/ML IV SOLN COMPARISON:  Head CT 09/29/2022. FINDINGS: Brain: No age advanced or lobar predominant parenchymal atrophy. Expanded and partially empty sella turcica. No cortical encephalomalacia is identified. No significant cerebral white matter disease. There is no acute infarct. No evidence of an intracranial mass. No extra-axial fluid collection. No midline shift. No pathologic intracranial enhancement identified. Vascular: Maintained flow voids within the proximal large arterial vessels. Skull and upper cervical spine: No focal suspicious marrow lesion. Sinuses/Orbits: No mass or acute finding within the imaged orbits. Medially displaced fracture deformity of the left lamina papyracea (without overlying soft tissue swelling), presumably chronic. Minimal mucosal thickening within the bilateral maxillary sinuses. IMPRESSION: 1. No evidence of acute infarct. 2. Expanded and partially empty sella turcica. This  finding can reflect incidental anatomic variation, or alternatively, it can be associated with idiopathic intracranial hypertension (pseudotumor cerebri). 3. Otherwise unremarkable MRI appearance of the brain. Electronically Signed   By: Jackey Loge D.O.   On: 09/29/2022 17:57   CT HEAD WO CONTRAST  Result Date: 09/29/2022 CLINICAL DATA:  Transient ischemic attack EXAM: CT HEAD WITHOUT CONTRAST TECHNIQUE: Contiguous axial images were obtained from the base of the skull through the vertex without intravenous contrast. RADIATION DOSE REDUCTION: This exam was performed according to the departmental dose-optimization program which includes automated exposure control, adjustment of the mA and/or kV according to patient size and/or use of iterative reconstruction technique. COMPARISON:  None Available. FINDINGS: Brain: The sella is expanded and a macroscopic  fat containing nodule measuring 11 mm replace the toward the area gland likely representing a sellar dermoid or lipoma. No other intra or extra-axial mass lesion identified. No abnormal mass effect or midline shift. No acute intracranial hemorrhage or infarct. Ventricular size is normal. Cerebellum is unremarkable. Vascular: No hyperdense vessel or unexpected calcification. Skull: Normal. Negative for fracture or focal lesion. Sinuses/Orbits: Remote left medial orbital wall fracture with herniation of extraconal fat as well as the medial rectus through the large defect. Orbits are otherwise unremarkable. Paranasal sinuses are clear. Other: Mastoid air cells and middle ear cavities are clear. IMPRESSION: 1. No acute intracranial abnormality. 2. 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 This would be better assessed with nonemergent contrast enhanced MRI examination. 3. Remote left medial orbital wall fracture with herniation of extraconal fat as well as the medial rectus through the defect. Electronically Signed   By: Helyn Numbers M.D.   On: 09/29/2022 11:40     Procedures Procedures    Medications Ordered in ED Medications  LORazepam (ATIVAN) injection 1 mg (1 mg Intravenous Given 09/29/22 1642)  gadobutrol (GADAVIST) 1 MMOL/ML injection 9 mL (9 mLs Intravenous Contrast Given 09/29/22 1716)    ED Course/ Medical Decision Making/ A&P                             Medical Decision Making Amount and/or Complexity of Data Reviewed Labs: ordered. Radiology: ordered.  Risk Prescription drug management.   This patient presents to the ED for concern of scalp and forehead numbness, visual disturbance, this involves an extensive number of treatment options, and is a complaint that carries with it a high risk of complications and morbidity.  The differential diagnosis includes CVA, neoplasm, alcohol withdrawal, polypharmacy, tactile hallucinations   Co morbidities that complicate the patient evaluation  T2DM, HLD, GERD, prediabetes, alcohol abuse   Additional history obtained:  Additional history obtained from N/A External records from outside source obtained and reviewed including EMR   Lab Tests:  I Ordered, and personally interpreted labs.  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  No other acute findings were identified I agree with the radiologist interpretation   Cardiac Monitoring: / EKG:  The patient was maintained on a cardiac monitor.  I personally viewed and interpreted the cardiac monitored which showed an underlying rhythm of: Sinus rhythm  Problem List / ED Course / Critical interventions / Medication management  Patient presents for scalp and forehead numbness and tingling.  This has been present bilaterally since yesterday  evening.  He states that his vision seems yellow.  He has had chest pressure for the last 2 days.  He denies any other recent symptoms.  Patient has no objective neurologic findings on exam.  He is well-appearing.  EKG does not show any concerning ST segment changes.  Workup was initiated.  Lab work is unremarkable.  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  This, in addition to his recent symptoms, prompted MRI.  MRI showed a partially empty sella.  Patient's symptoms not consistent with IIH.  On reassessment, patient is eating a salad.  He states that his symptoms have improved.  He did receive a  dose of Ativan for anxiolysis to undergo MRI.  Given his improvement, he may be having some residual withdrawal symptoms.  Given his well appearance and resolution of symptoms, in addition to his reassuring workup, patient is stable for discharge back to [facility]. I ordered medication including Ativan for anxiolysis Reevaluation of the patient after these medicines showed that the patient resolved I have reviewed the patients home medicines and have made adjustments as needed   Social Determinants of Health:  History of alcohol abuse         Final Clinical Impression(s) / ED Diagnoses Final diagnoses:  Numbness    Rx / DC Orders ED Discharge Orders     None         Gloris Manchester, MD 09/29/22 1815

## 2022-09-29 NOTE — ED Notes (Signed)
911 called for transportation services to [facility] ED per orders from the provider.

## 2022-09-29 NOTE — ED Notes (Signed)
Patient was sent to [facility] ED for medical clearance. Patient reported n/v on 09/28/2022. Patient reported this morning that his head felt numb, he was having blurred vision and his left arm felt lazy. Writer used a Spanish interpreter to assist with the patient explaining his symptoms. Patient's provider was informed and patient was sent to the ED. Patient denies Si/HI and AVH.

## 2022-09-30 DIAGNOSIS — F1020 Alcohol dependence, uncomplicated: Secondary | ICD-10-CM | POA: Diagnosis not present

## 2022-09-30 LAB — GLUCOSE, CAPILLARY
Glucose-Capillary: 238 mg/dL — ABNORMAL HIGH (ref 70–99)
Glucose-Capillary: 159 mg/dL — ABNORMAL HIGH (ref 70–99)
Glucose-Capillary: 191 mg/dL — ABNORMAL HIGH (ref 70–99)
Glucose-Capillary: 275 mg/dL — ABNORMAL HIGH (ref 70–99)
Glucose-Capillary: 247 mg/dL — ABNORMAL HIGH (ref 70–99)
Glucose-Capillary: 236 mg/dL — ABNORMAL HIGH (ref 70–99)

## 2022-09-30 MED ORDER — ATORVASTATIN CALCIUM 40 MG PO TABS
40.0000 mg | ORAL_TABLET | Freq: Every day | ORAL | Status: DC
  Administered 2022-09-30 – 2022-10-01 (×2): 40 mg via ORAL
  Filled 2022-09-30: qty 7
  Filled 2022-09-30 (×2): qty 1

## 2022-09-30 MED ORDER — INSULIN GLARGINE-YFGN 100 UNIT/ML ~~LOC~~ SOLN
15.0000 [IU] | Freq: Every day | SUBCUTANEOUS | Status: DC
  Administered 2022-09-30: 15 [IU] via SUBCUTANEOUS

## 2022-09-30 MED ORDER — ACETAMINOPHEN 500 MG PO TABS: 500.0000 mg | ORAL_TABLET | Freq: Four times a day (QID) | ORAL | Status: DC | PRN

## 2022-09-30 MED ORDER — INSULIN ASPART 100 UNIT/ML IJ SOLN
0.0000 [IU] | Freq: Every day | INTRAMUSCULAR | Status: DC
  Administered 2022-09-30: 2 [IU] via SUBCUTANEOUS

## 2022-09-30 MED ORDER — METFORMIN HCL 500 MG PO TABS
750.0000 mg | ORAL_TABLET | Freq: Two times a day (BID) | ORAL | Status: DC
  Administered 2022-09-30 (×2): 750 mg via ORAL
  Filled 2022-09-30 (×2): qty 2

## 2022-09-30 MED ORDER — INSULIN ASPART 100 UNIT/ML IJ SOLN
0.0000 [IU] | Freq: Three times a day (TID) | INTRAMUSCULAR | Status: DC
  Administered 2022-09-30 (×2): 5 [IU] via SUBCUTANEOUS
  Administered 2022-09-30: 3 [IU] via SUBCUTANEOUS
  Administered 2022-10-01: 2 [IU] via SUBCUTANEOUS
  Administered 2022-10-01 (×2): 3 [IU] via SUBCUTANEOUS

## 2022-09-30 MED ORDER — LISINOPRIL 20 MG PO TABS
20.0000 mg | ORAL_TABLET | Freq: Every day | ORAL | Status: DC
  Administered 2022-09-30 – 2022-10-01 (×2): 20 mg via ORAL
  Filled 2022-09-30: qty 7
  Filled 2022-09-30 (×2): qty 1

## 2022-09-30 MED ORDER — TRAZODONE HCL 50 MG PO TABS
50.0000 mg | ORAL_TABLET | Freq: Every evening | ORAL | Status: DC | PRN
  Administered 2022-09-30: 50 mg via ORAL
  Filled 2022-09-30: qty 1

## 2022-09-30 NOTE — ED Notes (Signed)
Patient denies SI,HI,AVH. Morning medication administered without difficulty to patient. Patient participating well in groups. Patient is cooperative and interacts well with staff. Respiratory is even and unlabored. No distress noted. Patient is watching TV with peers at present. Patient has no complaints at present. Will continue to monitor for safety.

## 2022-09-30 NOTE — ED Notes (Signed)
Patient observed/assessed at nursing station. Patient alert and oriented x 4. Affect is bright. Patient denies pain and anxiety. He denies A/V/H. He denies having any thoughts/plan of self harm and harm towards others. Fluid and snack offered. Patient states that appetite has been good throughout the day. Verbalizes no further complaints at this time. Will continue to monitor and support.  

## 2022-09-30 NOTE — ED Provider Notes (Signed)
Facility Based Crisis Admission H&P  Date: 09/30/22 Patient Name: Derek Cruz MRN: [id number] Chief Complaint: alcohol detox  Diagnoses:  Final diagnoses:  Alcohol abuse    HPI: Derek Cruz is a 57 y/o male who initially presented to [facility] on 09/26/22 for alcohol detox. Patient reported that he was drinking a 12 pack daily for the past three months and it has been causing tension and difficulty with his family members.  Patient reported on 09/29/22 that he was having some numbness and tingling in his arms and patient was sent out to [facility] to be medically cleared  to return to [facility]. Patient was worked up by [employer].  Patient was evaluated face to face by this Nurse Practitioner and chart reviewed. Patient is sitting calmly in the assessment room and does not appear to be in any distress. Patient does not appear to be responding to any internal or external stimuli at this time, or experiencing any delusions, paranoia or thought blocking. Patient was medically cleared and has returned to [facility]. Patient reports that he feels much better and denies any numbness tingling or chest pain at this time. Patient denies any SI/HI or AVH. Patient will be resuming his alcohol detox treatment on [facility].  EDP- Gloris Manchester MD-Patient presents for scalp and forehead numbness and tingling.  This has been present bilaterally since yesterday evening.  He states that his vision seems yellow.  He has had chest pressure for the last 2 days.  He denies any other recent symptoms.  Patient has no objective neurologic findings on exam.  He is well-appearing.  EKG does not show any concerning ST segment changes.  Workup was initiated.  Lab work is unremarkable.  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  This, in addition to his recent symptoms, prompted MRI.  MRI showed a partially empty sella.  Patient's symptoms not consistent with IIH.  On reassessment, patient is eating a salad.  He states  that his symptoms have improved.  He did receive a dose of Ativan for anxiolysis to undergo MRI.  Given his improvement, he may be having some residual withdrawal symptoms.  Given his well appearance and resolution of symptoms, in addition to his reassuring workup, patient is stable for discharge back to [facility]. I ordered medication including Ativan for anxiolysis Reevaluation of the patient after these medicines showed that the patient resolved I have reviewed the patients home medicines and have made adjustments as needed   PHQ 2-9:  Flowsheet Row ED from 09/29/2022 in [facility] Office Visit from 09/14/2020 in [facility] Primary Care at [facility]  Thoughts that you would be better off dead, or of hurting yourself in some way Not at all Not at all  PHQ-9 Total Score 4 0       Flowsheet Row ED from 09/29/2022 in [facility] Emergency Department at [facility] ED from 09/27/2022 in [facility] ED from 09/26/2022 in [facility] Emergency Department at [facility]  C-SSRS RISK CATEGORY No Risk No Risk High Risk       Screenings    Flowsheet Row Most Recent Value  CIWA-Ar Total 0       Total Time spent with patient: 15 minutes  Musculoskeletal  Strength & Muscle Tone: within normal limits Gait & Station: normal Patient leans: N/A  Psychiatric Specialty Exam  Presentation General Appearance:  Casual  Eye Contact: Good  Speech: Clear and  Coherent  Speech Volume: Normal  Handedness: Right   Mood and Affect  Mood: Euthymic  Affect: Appropriate   Thought Process  Thought Processes: Coherent  Descriptions of Associations:Intact  Orientation:Full (Time, Place and Person)  Thought Content:WDL  Diagnosis of Schizophrenia or Schizoaffective disorder in past: No   Hallucinations:Hallucinations: None  Ideas of Reference:None  Suicidal Thoughts:Suicidal Thoughts: No  Homicidal  Thoughts:Homicidal Thoughts: No   Sensorium  Memory: Immediate Good; Recent Good; Remote Good  Judgment: Fair  Insight: Fair   Art therapist  Concentration: Good  Attention Span: Good  Recall: Good  Fund of Knowledge: Good  Language: Fair   Psychomotor Activity  Psychomotor Activity: Psychomotor Activity: Normal   Assets  Assets: Communication Skills; Desire for Improvement; Housing; Resilience; Social Support   Sleep  Sleep: Sleep: Fair Number of Hours of Sleep: -1   Nutritional Assessment (For OBS and [facility]BC admissions only) Has the patient had a weight loss or gain of 10 pounds or more in the last 3 months?: No Has the patient had a decrease in food intake/or appetite?: No Does the patient have dental problems?: No Does the patient have eating habits or behaviors that may be indicators of an eating disorder including binging or inducing vomiting?: No Has the patient recently lost weight without trying?: 0 Has the patient been eating poorly because of a decreased appetite?: 0 Malnutrition Screening Tool Score: 0    Physical Exam HENT:     Head: Normocephalic and atraumatic.     Nose: Nose normal.  Eyes:     Pupils: Pupils are equal, round, and reactive to light.  Cardiovascular:     Rate and Rhythm: Normal rate.  Pulmonary:     Effort: Pulmonary effort is normal.  Abdominal:     General: Abdomen is flat.  Musculoskeletal:        General: Normal range of motion.     Cervical back: Normal range of motion.  Skin:    General: Skin is warm.  Neurological:     Mental Status: He is alert and oriented to person, place, and time.  Psychiatric:        Attention and Perception: Attention normal.        Mood and Affect: Mood normal.        Speech: Speech normal.        Behavior: Behavior is cooperative.        Thought Content: Thought content normal. Thought content is not paranoid or delusional. Thought content does not include homicidal or  suicidal ideation. Thought content does not include homicidal or suicidal plan.        Cognition and Memory: Cognition normal.        Judgment: Judgment is impulsive.    Review of Systems  Constitutional: Negative.   HENT: Negative.    Eyes: Negative.   Respiratory: Negative.    Cardiovascular: Negative.   Gastrointestinal: Negative.   Genitourinary: Negative.   Musculoskeletal: Negative.   Skin: Negative.   Neurological: Negative.   Endo/Heme/Allergies: Negative.   Psychiatric/Behavioral:  Positive for substance abuse.     Blood pressure (!) 129/91, pulse 71, temperature 98.6 F (37 C), temperature source Tympanic, resp. rate 18, SpO2 100 %. There is no height or weight on file to calculate BMI.  Past Psychiatric History: alcohol abuse  Is the patient at risk to self? No  Has the patient been a risk to self in the past 6 months? No .    Has the patient been  a risk to self within the distant past? No   Is the patient a risk to others? No   Has the patient been a risk to others in the past 6 months? No   Has the patient been a risk to others within the distant past? No   Past Medical History: type 2 diabetes, dyslipidemia, GERD, severe alcohol use disorder c/b alcoholic hepatitis and liver failure.  Family History: none reported  Social History: Lives with wife, son and two daughters, works as a Designer, fashion/clothing  Last Labs:  Admission on 09/29/2022, Discharged on 09/29/2022  Component Date Value Ref Range Status   WBC 09/29/2022 5.7  4.0 - 10.5 K/uL Final   RBC 09/29/2022 4.72  4.22 - 5.81 MIL/uL Final   Hemoglobin 09/29/2022 15.1  13.0 - 17.0 g/dL Final   HCT 95/63/8756 43.1  39.0 - 52.0 % Final   MCV 09/29/2022 91.3  80.0 - 100.0 fL Final   MCH 09/29/2022 32.0  26.0 - 34.0 pg Final   MCHC 09/29/2022 35.0  30.0 - 36.0 g/dL Final   RDW 43/32/9518 13.3  11.5 - 15.5 % Final   Platelets 09/29/2022 157  150 - 400 K/uL Final   nRBC 09/29/2022 0.0  0.0 - 0.2 % Final   Performed at  [facility] Lab, [address], [place], [place] [address]   Neutrophils Relative % 09/29/2022 71  % Final   Neutro Abs 09/29/2022 4.1  1.7 - 7.7 K/uL Final   Lymphocytes Relative 09/29/2022 15  % Final   Lymphs Abs 09/29/2022 0.8  0.7 - 4.0 K/uL Final   Monocytes Relative 09/29/2022 12  % Final   Monocytes Absolute 09/29/2022 0.7  0.1 - 1.0 K/uL Final   Eosinophils Relative 09/29/2022 1  % Final   Eosinophils Absolute 09/29/2022 0.1  0.0 - 0.5 K/uL Final   Basophils Relative 09/29/2022 1  % Final   Basophils Absolute 09/29/2022 0.0  0.0 - 0.1 K/uL Final   Immature Granulocytes 09/29/2022 0  % Final   Abs Immature Granulocytes 09/29/2022 0.02  0.00 - 0.07 K/uL Final   Performed at [facility] Lab, [address], [place], [place] [address]   Sodium 09/29/2022 133 (L)  135 - 145 mmol/L Final   Potassium 09/29/2022 3.7  3.5 - 5.1 mmol/L Final   Chloride 09/29/2022 101  98 - 111 mmol/L Final   CO2 09/29/2022 23  22 - 32 mmol/L Final   Glucose, Bld 09/29/2022 206 (H)  70 - 99 mg/dL Final   Glucose reference range applies only to samples taken after fasting for at least 8 hours.   BUN 09/29/2022 11  6 - 20 mg/dL Final   Creatinine, Ser 09/29/2022 0.65  0.61 - 1.24 mg/dL Final   Calcium 60/02/9322 9.2  8.9 - 10.3 mg/dL Final   Total Protein 55/73/2202 7.0  6.5 - 8.1 g/dL Final   Albumin 54/27/0623 3.7  3.5 - 5.0 g/dL Final   AST 76/28/3151 126 (H)  15 - 41 U/L Final   ALT 09/29/2022 161 (H)  0 - 44 U/L Final   Alkaline Phosphatase 09/29/2022 115  38 - 126 U/L Final   Total Bilirubin 09/29/2022 0.7  0.3 - 1.2 mg/dL Final   GFR, Estimated 09/29/2022 >60  >60 mL/min Final   Comment: (NOTE) Calculated using the CKD-EPI Creatinine Equation (2021)    Anion gap 09/29/2022 9  5 - 15 Final   Performed at [facility] Lab, [address]  [place], [place] [address]   Alcohol, Ethyl (B) 09/29/2022 <10  <10 mg/dL Final   Comment: (NOTE) Lowest detectable limit for serum alcohol is 10  mg/dL.  For medical purposes only. Performed at [facility] Lab, [address], [place], [place] [address]    Magnesium 09/29/2022 2.2  1.7 - 2.4 mg/dL Final   Performed at [facility] Lab, [address] [place], [place] [address]   Troponin I (High Sensitivity) 09/29/2022 6  <18 ng/L Final   Comment: (NOTE) Elevated high sensitivity troponin I (hsTnI) values and significant  changes across serial measurements may suggest ACS but many other  chronic and acute conditions are known to elevate hsTnI results.  Refer to the "Links" section for chest pain algorithms and additional  guidance. Performed at [facility], [address] [place], [place] [address]    Troponin I (High Sensitivity) 09/29/2022 5  <18 ng/L Final   Comment: (NOTE) Elevated high sensitivity troponin I (hsTnI) values and significant  changes across serial measurements may suggest ACS but many other  chronic and acute conditions are known to elevate hsTnI results.  Refer to the "Links" section for chest pain algorithms and additional  guidance. Performed at [facility], [address] [place], [place] [address]    Glucose-Capillary 09/29/2022 225 (H)  70 - 99 mg/dL Final   Glucose reference range applies only to samples taken after fasting for at least 8 hours.  Admission on 09/27/2022, Discharged on 09/29/2022  Component Date Value Ref Range Status   Glucose-Capillary 09/27/2022 271 (H)  70 - 99 mg/dL Final   Glucose reference range applies only to samples taken after fasting for at least 8 hours.   Glucose-Capillary 09/27/2022 252 (H)  70 - 99 mg/dL Final   Glucose reference range applies only to samples taken after fasting for at least 8 hours.   Glucose-Capillary 09/27/2022 284 (H)  70 - 99 mg/dL Final   Glucose reference range applies only to samples taken after fasting for at least 8 hours.   Glucose-Capillary 09/27/2022 226 (H)  70 - 99 mg/dL Final   Glucose reference range applies  only to samples taken after fasting for at least 8 hours.   Glucose-Capillary 09/27/2022 342 (H)  70 - 99 mg/dL Final   Glucose reference range applies only to samples taken after fasting for at least 8 hours.   Glucose-Capillary 09/28/2022 255 (H)  70 - 99 mg/dL Final   Glucose reference range applies only to samples taken after fasting for at least 8 hours.   Glucose-Capillary 09/28/2022 324 (H)  70 - 99 mg/dL Final   Glucose reference range applies only to samples taken after fasting for at least 8 hours.   Glucose-Capillary 09/28/2022 309 (H)  70 - 99 mg/dL Final   Glucose reference range applies only to samples taken after fasting for at least 8 hours.   Glucose-Capillary 09/28/2022 311 (H)  70 - 99 mg/dL Final   Glucose reference range applies only to samples taken after fasting for at least 8 hours.   Glucose-Capillary 09/29/2022 305 (H)  70 - 99 mg/dL Final   Glucose reference range applies only to samples taken after fasting for at least 8 hours.  Admission on 09/26/2022, Discharged on 09/27/2022  Component Date Value Ref Range Status   WBC 09/26/2022 8.1  4.0 - 10.5 K/uL Final   RBC 09/26/2022 4.88  4.22 - 5.81 MIL/uL Final   Hemoglobin 09/26/2022 15.9  13.0 -  17.0 g/dL Final   HCT 16/02/9603 44.4  39.0 - 52.0 % Final   MCV 09/26/2022 91.0  80.0 - 100.0 fL Final   MCH 09/26/2022 32.6  26.0 - 34.0 pg Final   MCHC 09/26/2022 35.8  30.0 - 36.0 g/dL Final   RDW 54/01/8118 13.5  11.5 - 15.5 % Final   Platelets 09/26/2022 150  150 - 400 K/uL Final   nRBC 09/26/2022 0.0  0.0 - 0.2 % Final   Performed at [facility], [address] [place], [place] [address]   Sodium 09/26/2022 131 (L)  135 - 145 mmol/L Final   Potassium 09/26/2022 4.2  3.5 - 5.1 mmol/L Final   Chloride 09/26/2022 98  98 - 111 mmol/L Final   CO2 09/26/2022 18 (L)  22 - 32 mmol/L Final   Glucose, Bld 09/26/2022 296 (H)  70 - 99 mg/dL Final   Glucose reference range applies only to samples taken after fasting  for at least 8 hours.   BUN 09/26/2022 18  6 - 20 mg/dL Final   Creatinine, Ser 09/26/2022 0.88  0.61 - 1.24 mg/dL Final   Calcium 95/62/1308 9.9  8.9 - 10.3 mg/dL Final   Total Protein 65/78/4696 7.1  6.5 - 8.1 g/dL Final   Albumin 29/52/8413 3.8  3.5 - 5.0 g/dL Final   AST 24/40/1027 112 (H)  15 - 41 U/L Final   ALT 09/26/2022 172 (H)  0 - 44 U/L Final   Alkaline Phosphatase 09/26/2022 162 (H)  38 - 126 U/L Final   Total Bilirubin 09/26/2022 0.6  0.3 - 1.2 mg/dL Final   GFR, Estimated 09/26/2022 >60  >60 mL/min Final   Comment: (NOTE) Calculated using the CKD-EPI Creatinine Equation (2021)    Anion gap 09/26/2022 15  5 - 15 Final   Performed at [facility], [address], [place], [place] [address]   Glucose-Capillary 09/26/2022 281 (H)  70 - 99 mg/dL Final   Glucose reference range applies only to samples taken after fasting for at least 8 hours.   Alcohol, Ethyl (B) 09/26/2022 <10  <10 mg/dL Final   Comment: (NOTE) Lowest detectable limit for serum alcohol is 10 mg/dL.  For medical purposes only. Performed at [facility], [address], [place], [place] [address]    Color, Urine 09/26/2022 YELLOW  YELLOW Final   APPearance 09/26/2022 CLEAR  CLEAR Final   Specific Gravity, Urine 09/26/2022 1.023  1.005 - 1.030 Final   pH 09/26/2022 5.0  5.0 - 8.0 Final   Glucose, UA 09/26/2022 >=500 (A)  NEGATIVE mg/dL Final   Hgb urine dipstick 09/26/2022 NEGATIVE  NEGATIVE Final   Bilirubin Urine 09/26/2022 NEGATIVE  NEGATIVE Final   Ketones, ur 09/26/2022 NEGATIVE  NEGATIVE mg/dL Final   Protein, ur 74/25/9563 NEGATIVE  NEGATIVE mg/dL Final   Nitrite 87/56/4332 NEGATIVE  NEGATIVE Final   Leukocytes,Ua 09/26/2022 NEGATIVE  NEGATIVE Final   RBC / HPF 09/26/2022 0-5  0 - 5 RBC/hpf Final   WBC, UA 09/26/2022 0-5  0 - 5 WBC/hpf Final   Bacteria, UA 09/26/2022 RARE (A)  NONE SEEN Final   Squamous Epithelial / HPF 09/26/2022 0-5  0 - 5 /HPF Final   Performed at [facility], [address] [place], [place] [address]   Opiates 09/26/2022 NONE DETECTED  NONE DETECTED Final   Cocaine 09/26/2022 NONE DETECTED  NONE DETECTED Final   Benzodiazepines 09/26/2022 POSITIVE (A)  NONE DETECTED Final  Amphetamines 09/26/2022 NONE DETECTED  NONE DETECTED Final   Tetrahydrocannabinol 09/26/2022 NONE DETECTED  NONE DETECTED Final   Barbiturates 09/26/2022 NONE DETECTED  NONE DETECTED Final   Comment: (NOTE) DRUG SCREEN FOR MEDICAL PURPOSES ONLY.  IF CONFIRMATION IS NEEDED FOR ANY PURPOSE, NOTIFY LAB WITHIN 5 DAYS.  LOWEST DETECTABLE LIMITS FOR URINE DRUG SCREEN Drug Class                     Cutoff (ng/mL) Amphetamine and metabolites    1000 Barbiturate and metabolites    200 Benzodiazepine                 200 Opiates and metabolites        300 Cocaine and metabolites        300 THC                            50 Performed at [facility] Lab, [address], [place], [place] [address]    pH, Ven 09/26/2022 7.437 (H)  7.25 - 7.43 Final   pCO2, Ven 09/26/2022 31.6 (L)  44 - 60 mmHg Final   pO2, Ven 09/26/2022 87 (H)  32 - 45 mmHg Final   Bicarbonate 09/26/2022 21.3  20.0 - 28.0 mmol/L Final   TCO2 09/26/2022 22  22 - 32 mmol/L Final   O2 Saturation 09/26/2022 97  % Final   Acid-base deficit 09/26/2022 2.0  0.0 - 2.0 mmol/L Final   Sodium 09/26/2022 133 (L)  135 - 145 mmol/L Final   Potassium 09/26/2022 3.6  3.5 - 5.1 mmol/L Final   Calcium, Ion 09/26/2022 1.19  1.15 - 1.40 mmol/L Final   HCT 09/26/2022 42.0  39.0 - 52.0 % Final   Hemoglobin 09/26/2022 14.3  13.0 - 17.0 g/dL Final   Sample type 60/45/4098 VENOUS   Final   Glucose-Capillary 09/26/2022 294 (H)  70 - 99 mg/dL Final   Glucose reference range applies only to samples taken after fasting for at least 8 hours.  Admission on 09/25/2022, Discharged on 09/26/2022  Component Date Value Ref Range Status   WBC 09/25/2022 5.2  4.0 - 10.5 K/uL Final   RBC 09/25/2022 5.20  4.22 - 5.81 MIL/uL Final    Hemoglobin 09/25/2022 16.9  13.0 - 17.0 g/dL Final   HCT 11/91/4782 46.0  39.0 - 52.0 % Final   MCV 09/25/2022 88.5  80.0 - 100.0 fL Final   MCH 09/25/2022 32.5  26.0 - 34.0 pg Final   MCHC 09/25/2022 36.7 (H)  30.0 - 36.0 g/dL Final   RDW 95/62/1308 13.2  11.5 - 15.5 % Final   Platelets 09/25/2022 146 (L)  150 - 400 K/uL Final   nRBC 09/25/2022 0.0  0.0 - 0.2 % Final   Neutrophils Relative % 09/25/2022 55  % Final   Neutro Abs 09/25/2022 2.8  1.7 - 7.7 K/uL Final   Lymphocytes Relative 09/25/2022 32  % Final   Lymphs Abs 09/25/2022 1.7  0.7 - 4.0 K/uL Final   Monocytes Relative 09/25/2022 11  % Final   Monocytes Absolute 09/25/2022 0.6  0.1 - 1.0 K/uL Final   Eosinophils Relative 09/25/2022 1  % Final   Eosinophils Absolute 09/25/2022 0.1  0.0 - 0.5 K/uL Final   Basophils Relative 09/25/2022 1  % Final   Basophils Absolute 09/25/2022 0.0  0.0 - 0.1 K/uL Final   Immature Granulocytes 09/25/2022 0  % Final  Abs Immature Granulocytes 09/25/2022 0.02  0.00 - 0.07 K/uL Final   Performed at [facility], [address], [place], [place] [address]   Sodium 09/25/2022 133 (L)  135 - 145 mmol/L Final   Potassium 09/25/2022 3.5  3.5 - 5.1 mmol/L Final   Chloride 09/25/2022 100  98 - 111 mmol/L Final   CO2 09/25/2022 17 (L)  22 - 32 mmol/L Final   Glucose, Bld 09/25/2022 251 (H)  70 - 99 mg/dL Final   Glucose reference range applies only to samples taken after fasting for at least 8 hours.   BUN 09/25/2022 <5 (L)  6 - 20 mg/dL Final   Creatinine, Ser 09/25/2022 0.64  0.61 - 1.24 mg/dL Final   Calcium 60/45/4098 9.7  8.9 - 10.3 mg/dL Final   Total Protein 11/91/4782 7.4  6.5 - 8.1 g/dL Final   Albumin 95/62/1308 4.2  3.5 - 5.0 g/dL Final   AST 65/78/4696 111 (H)  15 - 41 U/L Final   ALT 09/25/2022 172 (H)  0 - 44 U/L Final   Alkaline Phosphatase 09/25/2022 132 (H)  38 - 126 U/L Final   Total Bilirubin 09/25/2022 0.5  0.3 - 1.2 mg/dL Final   GFR, Estimated 09/25/2022 >60  >60 mL/min  Final   Comment: (NOTE) Calculated using the CKD-EPI Creatinine Equation (2021)    Anion gap 09/25/2022 16 (H)  5 - 15 Final   Performed at [facility], [address] [place], [place] [address]   Hgb A1c MFr Bld 09/25/2022 10.8 (H)  4.8 - 5.6 % Final   Comment: (NOTE) Pre diabetes:          5.7%-6.4%  Diabetes:              >6.4%  Glycemic control for   <7.0% adults with diabetes    Mean Plasma Glucose 09/25/2022 263.26  mg/dL Final   Performed at [facility], [address] [place], [place] [address]   Alcohol, Ethyl (B) 09/25/2022 290 (H)  <10 mg/dL Final   Comment: (NOTE) Lowest detectable limit for serum alcohol is 10 mg/dL.  For medical purposes only. Performed at [facility], [address] [place], [place] [address]    POC Amphetamine UR 09/26/2022 None Detected  NONE DETECTED (Cut Off Level 1000 ng/mL) Final   POC Secobarbital (BAR) 09/26/2022 None Detected  NONE DETECTED (Cut Off Level 300 ng/mL) Final   POC Buprenorphine (BUP) 09/26/2022 None Detected  NONE DETECTED (Cut Off Level 10 ng/mL) Final   POC Oxazepam (BZO) 09/26/2022 Positive (A)  NONE DETECTED (Cut Off Level 300 ng/mL) Final   POC Cocaine UR 09/26/2022 None Detected  NONE DETECTED (Cut Off Level 300 ng/mL) Final   POC Methamphetamine UR 09/26/2022 None Detected  NONE DETECTED (Cut Off Level 1000 ng/mL) Final   POC Morphine 09/26/2022 None Detected  NONE DETECTED (Cut Off Level 300 ng/mL) Final   POC Methadone UR 09/26/2022 None Detected  NONE DETECTED (Cut Off Level 300 ng/mL) Final   POC Oxycodone UR 09/26/2022 None Detected  NONE DETECTED (Cut Off Level 100 ng/mL) Final   POC Marijuana UR 09/26/2022 None Detected  NONE DETECTED (Cut Off Level 50 ng/mL) Final   Cholesterol 09/25/2022 192  0 - 200 mg/dL Final   Triglycerides 72/53/6644 169 (H)  <150 mg/dL Final   HDL 03/47/4259 98  >40 mg/dL Final   Total CHOL/HDL Ratio 09/25/2022 2.0  RATIO Final   VLDL 09/25/2022 34  0 - 40 mg/dL  Final   LDL Cholesterol 09/25/2022 60  0 - 99 mg/dL Final   Comment:        Total Cholesterol/HDL:CHD Risk Coronary Heart Disease Risk Table                     Men   Women  1/2 Average Risk   3.4   3.3  Average Risk       5.0   4.4  2 X Average Risk   9.6   7.1  3 X Average Risk  23.4   11.0        Use the calculated Patient Ratio above and the CHD Risk Table to determine the patient's CHD Risk.        ATP III CLASSIFICATION (LDL):  <100     mg/dL   Optimal  528-413  mg/dL   Near or Above                    Optimal  130-159  mg/dL   Borderline  244-010  mg/dL   High  >272     mg/dL   Very High Performed at [facility], [address] [place], [place] [address]    TSH 09/25/2022 1.526  0.350 - 4.500 uIU/mL Final   Comment: Performed by a 3rd Generation assay with a functional sensitivity of <=0.01 uIU/mL. Performed at [facility], [address], [place], [place] [address]    Prothrombin Time 09/26/2022 13.0  11.4 - 15.2 seconds Final   INR 09/26/2022 1.0  0.8 - 1.2 Final   Comment: (NOTE) INR goal varies based on device and disease states. Performed at [facility], [address] [place], [place] [address]    Hepatitis B Surface Ag 09/26/2022 NON REACTIVE  NON REACTIVE Final   HCV Ab 09/26/2022 NON REACTIVE  NON REACTIVE Final   Comment: (NOTE) Nonreactive HCV antibody screen is consistent with no HCV infections,  unless recent infection is suspected or other evidence exists to indicate HCV infection.     Hep A IgM 09/26/2022 NON REACTIVE  NON REACTIVE Final   Hep B C IgM 09/26/2022 NON REACTIVE  NON REACTIVE Final   Performed at [facility], [address] [place], [place] [address]   Glucose-Capillary 09/26/2022 192 (H)  70 - 99 mg/dL Final   Glucose reference range applies only to samples taken after fasting for at least 8 hours.   Lipase 09/26/2022 64 (H)  11 - 51 U/L Final   Performed at [facility] Lab, [address]  [place], [place] [address]   Sodium 09/26/2022 135  135 - 145 mmol/L Final   Potassium 09/26/2022 3.6  3.5 - 5.1 mmol/L Final   Chloride 09/26/2022 102  98 - 111 mmol/L Final   CO2 09/26/2022 20 (L)  22 - 32 mmol/L Final   Glucose, Bld 09/26/2022 298 (H)  70 - 99 mg/dL Final   Glucose reference range applies only to samples taken after fasting for at least 8 hours.   BUN 09/26/2022 10  6 - 20 mg/dL Final   Creatinine, Ser 09/26/2022 0.71  0.61 - 1.24 mg/dL Final   Calcium 95/18/8416 9.5  8.9 - 10.3 mg/dL Final   Total Protein 60/63/0160 7.3  6.5 - 8.1 g/dL Final   Albumin 10/93/2355 3.9  3.5 - 5.0 g/dL Final   AST 73/22/0254 161 (H)  15 - 41  U/L Final   ALT 09/26/2022 185 (H)  0 - 44 U/L Final   Alkaline Phosphatase 09/26/2022 103  38 - 126 U/L Final   Total Bilirubin 09/26/2022 0.8  0.3 - 1.2 mg/dL Final   GFR, Estimated 09/26/2022 >60  >60 mL/min Final   Comment: (NOTE) Calculated using the CKD-EPI Creatinine Equation (2021)    Anion gap 09/26/2022 13  5 - 15 Final   Performed at [facility], [address], [place], [place] [address]   Glucose-Capillary 09/26/2022 315 (H)  70 - 99 mg/dL Final   Glucose reference range applies only to samples taken after fasting for at least 8 hours.   Glucose-Capillary 09/26/2022 387 (H)  70 - 99 mg/dL Final   Glucose reference range applies only to samples taken after fasting for at least 8 hours.   Glucose-Capillary 09/26/2022 404 (H)  70 - 99 mg/dL Final   Glucose reference range applies only to samples taken after fasting for at least 8 hours.  Admission on 09/23/2022, Discharged on 09/24/2022  Component Date Value Ref Range Status   Sodium 09/23/2022 134 (L)  135 - 145 mmol/L Final   Potassium 09/23/2022 3.6  3.5 - 5.1 mmol/L Final   Chloride 09/23/2022 96 (L)  98 - 111 mmol/L Final   CO2 09/23/2022 19 (L)  22 - 32 mmol/L Final   Glucose, Bld 09/23/2022 315 (H)  70 - 99 mg/dL Final   Glucose reference range applies only to samples taken  after fasting for at least 8 hours.   BUN 09/23/2022 <5 (L)  6 - 20 mg/dL Final   Creatinine, Ser 09/23/2022 0.63  0.61 - 1.24 mg/dL Final   Calcium 11/91/4782 9.1  8.9 - 10.3 mg/dL Final   GFR, Estimated 09/23/2022 >60  >60 mL/min Final   Comment: (NOTE) Calculated using the CKD-EPI Creatinine Equation (2021)    Anion gap 09/23/2022 19 (H)  5 - 15 Final   Performed at [facility], [address] [place], [place] [address]   WBC 09/23/2022 3.9 (L)  4.0 - 10.5 K/uL Final   RBC 09/23/2022 5.19  4.22 - 5.81 MIL/uL Final   Hemoglobin 09/23/2022 16.8  13.0 - 17.0 g/dL Final   HCT 30/86/5784 46.3  39.0 - 52.0 % Final   MCV 09/23/2022 89.2  80.0 - 100.0 fL Final   MCH 09/23/2022 32.4  26.0 - 34.0 pg Final   MCHC 09/23/2022 36.3 (H)  30.0 - 36.0 g/dL Final   CORRECTED FOR COLD AGGLUTININS   RDW 09/23/2022 13.6  11.5 - 15.5 % Final   Platelets 09/23/2022 164  150 - 400 K/uL Final   REPEATED TO VERIFY   nRBC 09/23/2022 0.0  0.0 - 0.2 % Final   Performed at [facility], [address] [place], [place] [address]   Troponin I (High Sensitivity) 09/23/2022 11  <18 ng/L Final   Comment: (NOTE) Elevated high sensitivity troponin I (hsTnI) values and significant  changes across serial measurements may suggest ACS but many other  chronic and acute conditions are known to elevate hsTnI results.  Refer to the "Links" section for chest pain algorithms and additional  guidance. Performed at [facility], [address], [place], [place] [address]    Troponin I (High Sensitivity) 09/23/2022 8  <18 ng/L Final   Comment: (NOTE) Elevated high sensitivity troponin I (hsTnI) values and significant  changes across serial measurements may suggest ACS but many other  chronic  and acute conditions are known to elevate hsTnI results.  Refer to the "Links" section for chest pain algorithms and additional  guidance. Performed at [facility], [address], [place],  [place] [address]    Total Protein 09/23/2022 7.8  6.5 - 8.1 g/dL Final   Albumin 11/91/4782 4.1  3.5 - 5.0 g/dL Final   AST 95/62/1308 127 (H)  15 - 41 U/L Final   ALT 09/23/2022 152 (H)  0 - 44 U/L Final   Alkaline Phosphatase 09/23/2022 111  38 - 126 U/L Final   Total Bilirubin 09/23/2022 0.6  0.3 - 1.2 mg/dL Final   Bilirubin, Direct 09/23/2022 0.2  0.0 - 0.2 mg/dL Final   Indirect Bilirubin 09/23/2022 0.4  0.3 - 0.9 mg/dL Final   Performed at [facility], [address], [place], [place] [address]   Alcohol, Ethyl (B) 09/23/2022 207 (H)  <10 mg/dL Final   Comment: (NOTE) Lowest detectable limit for serum alcohol is 10 mg/dL.  For medical purposes only. Performed at [facility], [address] [place], [place] [address]    Acetaminophen (Tylenol), Serum 09/23/2022 <10 (L)  10 - 30 ug/mL Final   Comment: (NOTE) Therapeutic concentrations vary significantly. A range of 10-30 ug/mL  may be an effective concentration for many patients. However, some  are best treated at concentrations outside of this range. Acetaminophen concentrations >150 ug/mL at 4 hours after ingestion  and >50 ug/mL at 12 hours after ingestion are often associated with  toxic reactions.  Performed at [facility], [address] [place], [place] [address]    Salicylate Lvl 09/23/2022 <7.0 (L)  7.0 - 30.0 mg/dL Final   Performed at [facility], [address], [place], [place] [address]   Opiates 09/23/2022 NONE DETECTED  NONE DETECTED Final   Cocaine 09/23/2022 NONE DETECTED  NONE DETECTED Final   Benzodiazepines 09/23/2022 NONE DETECTED  NONE DETECTED Final   Amphetamines 09/23/2022 NONE DETECTED  NONE DETECTED Final   Tetrahydrocannabinol 09/23/2022 NONE DETECTED  NONE DETECTED Final   Barbiturates 09/23/2022 NONE DETECTED  NONE DETECTED Final   Comment: (NOTE) DRUG SCREEN FOR MEDICAL PURPOSES ONLY.  IF CONFIRMATION IS NEEDED FOR ANY PURPOSE, NOTIFY LAB WITHIN 5 DAYS.  LOWEST  DETECTABLE LIMITS FOR URINE DRUG SCREEN Drug Class                     Cutoff (ng/mL) Amphetamine and metabolites    1000 Barbiturate and metabolites    200 Benzodiazepine                 200 Opiates and metabolites        300 Cocaine and metabolites        300 THC                            50 Performed at [facility], [address], [place], [place] [address]    Color, Urine 09/23/2022 YELLOW  YELLOW Final   APPearance 09/23/2022 CLEAR  CLEAR Final   Specific Gravity, Urine 09/23/2022 1.033 (H)  1.005 - 1.030 Final   pH 09/23/2022 6.0  5.0 - 8.0 Final   Glucose, UA 09/23/2022 >=500 (A)  NEGATIVE mg/dL Final   Hgb urine dipstick 09/23/2022 MODERATE (A)  NEGATIVE Final   Bilirubin Urine 09/23/2022 NEGATIVE  NEGATIVE Final   Ketones, ur 09/23/2022 5 (A)  NEGATIVE mg/dL Final   Protein, ur 40/98/1191 100 (A)  NEGATIVE mg/dL Final   Nitrite 47/82/9562 NEGATIVE  NEGATIVE Final   Leukocytes,Ua 09/23/2022 NEGATIVE  NEGATIVE Final   RBC / HPF 09/23/2022 0-5  0 - 5 RBC/hpf Final   WBC, UA 09/23/2022 0-5  0 - 5 WBC/hpf Final   Bacteria, UA 09/23/2022 NONE SEEN  NONE SEEN Final   Squamous Epithelial / HPF 09/23/2022 0-5  0 - 5 /HPF Final   Mucus 09/23/2022 PRESENT   Final   Performed at [facility], [address], [place], [place] [address]   Lipase 09/23/2022 41  11 - 51 U/L Final   Performed at [facility], [address], [place], [place] [address]   pH, Ven 09/23/2022 7.463 (H)  7.25 - 7.43 Final   pCO2, Ven 09/23/2022 32.1 (L)  44 - 60 mmHg Final   pO2, Ven 09/23/2022 68 (H)  32 - 45 mmHg Final   Bicarbonate 09/23/2022 23.0  20.0 - 28.0 mmol/L Final   TCO2 09/23/2022 24  22 - 32 mmol/L Final   O2 Saturation 09/23/2022 95  % Final   Acid-Base Excess 09/23/2022 0.0  0.0 - 2.0 mmol/L Final   Sodium 09/23/2022 134 (L)  135 - 145 mmol/L Final   Potassium 09/23/2022 3.6  3.5 - 5.1 mmol/L Final   Calcium, Ion 09/23/2022 1.06 (L)  1.15 - 1.40 mmol/L Final   HCT  09/23/2022 40.0  39.0 - 52.0 % Final   Hemoglobin 09/23/2022 13.6  13.0 - 17.0 g/dL Final   Sample type 96/29/5284 VENOUS   Final   Glucose-Capillary 09/23/2022 276 (H)  70 - 99 mg/dL Final   Glucose reference range applies only to samples taken after fasting for at least 8 hours.   Comment 1 09/23/2022 Notify RN   Final   pH, Ven 09/23/2022 7.480 (H)  7.25 - 7.43 Final   pCO2, Ven 09/23/2022 34.3 (L)  44 - 60 mmHg Final   pO2, Ven 09/23/2022 53 (H)  32 - 45 mmHg Final   Bicarbonate 09/23/2022 25.5  20.0 - 28.0 mmol/L Final   TCO2 09/23/2022 27  22 - 32 mmol/L Final   O2 Saturation 09/23/2022 90  % Final   Acid-Base Excess 09/23/2022 2.0  0.0 - 2.0 mmol/L Final   Sodium 09/23/2022 133 (L)  135 - 145 mmol/L Final   Potassium 09/23/2022 3.5  3.5 - 5.1 mmol/L Final   Calcium, Ion 09/23/2022 1.14 (L)  1.15 - 1.40 mmol/L Final   HCT 09/23/2022 42.0  39.0 - 52.0 % Final   Hemoglobin 09/23/2022 14.3  13.0 - 17.0 g/dL Final   Sample type 13/24/4010 VENOUS   Final   Glucose-Capillary 09/24/2022 239 (H)  70 - 99 mg/dL Final   Glucose reference range applies only to samples taken after fasting for at least 8 hours.   Comment 1 09/24/2022 Notify RN   Final   Comment 2 09/24/2022 Document in Chart   Final  Office Visit on 05/01/2022  Component Date Value Ref Range Status   Glucose 05/01/2022 275 (H)  70 - 99 mg/dL Final   BUN 27/25/3664 12  6 - 24 mg/dL Final   Creatinine, Ser 05/01/2022 0.66 (L)  0.76 - 1.27 mg/dL Final   eGFR 40/34/7425 110  >59 mL/min/1.73 Final   BUN/Creatinine Ratio 05/01/2022 18  9 - 20 Final   Sodium 05/01/2022 137  134 - 144 mmol/L Final   Potassium 05/01/2022 4.0  3.5 - 5.2 mmol/L Final   Chloride 05/01/2022 98  96 - 106 mmol/L Final   CO2 05/01/2022 21  20 - 29 mmol/L Final   Calcium 05/01/2022 9.6  8.7 - 10.2 mg/dL Final   Hgb Z6X MFr Bld 05/01/2022 7.1 (H)  4.8 - 5.6 % Final   Comment:          Prediabetes: 5.7 - 6.4          Diabetes: >6.4          Glycemic  control for adults with diabetes: <7.0    Est. average glucose Bld gHb Est-m* 05/01/2022 157  mg/dL Final    Allergies: Patient has no known allergies.  Medications:  Facility Ordered Medications  Medication   [COMPLETED] LORazepam (ATIVAN) tablet 1 mg   [COMPLETED] LORazepam (ATIVAN) injection 1 mg   [COMPLETED] gadobutrol (GADAVIST) 1 MMOL/ML injection 9 mL   acetaminophen (TYLENOL) tablet 650 mg   alum & mag hydroxide-simeth (MAALOX/MYLANTA) 200-200-20 MG/5ML suspension 30 mL   magnesium hydroxide (MILK OF MAGNESIA) suspension 30 mL   traZODone (DESYREL) tablet 50 mg   thiamine (VITAMIN B1) tablet 100 mg   multivitamin with minerals tablet 1 tablet   LORazepam (ATIVAN) tablet 1 mg   hydrOXYzine (ATARAX) tablet 25 mg   loperamide (IMODIUM) capsule 2-4 mg   ondansetron (ZOFRAN-ODT) disintegrating tablet 4 mg   LORazepam (ATIVAN) tablet 1 mg   Followed by   Melene Muller ON 10/01/2022] LORazepam (ATIVAN) tablet 1 mg   Followed by   Melene Muller ON 10/02/2022] LORazepam (ATIVAN) tablet 1 mg   Followed by   Melene Muller ON 10/04/2022] LORazepam (ATIVAN) tablet 1 mg   metFORMIN (GLUCOPHAGE) tablet 750 mg   lisinopril (ZESTRIL) tablet 20 mg   atorvastatin (LIPITOR) tablet 40 mg   PTA Medications  Medication Sig   acetaminophen (TYLENOL) 500 MG tablet Take 500 mg by mouth every 6 (six) hours as needed for headache.   metFORMIN (GLUCOPHAGE) 500 MG tablet Take 1.5 tablets (750 mg total) by mouth 2 (two) times daily with a meal.    Long Term Goals: Improvement in symptoms so as ready for discharge  Short Term Goals: Patient will verbalize feelings in meetings with treatment team members., Patient will attend at least of 50% of the groups daily., Pt will complete the PHQ9 on admission, day 3 and discharge., and Patient will participate in completing the [place] Suicide Severity Rating Scale  Medical Decision Making  Derek Cruz is a 57 y/o male who initially presented to [facility] on  09/26/22 for alcohol detox. Patient reported that he was drinking a 12 pack daily for the past three months and it has been causing tension and difficulty with his family members.  Patient reported on 09/29/22 that he was having some numbness and tingling in his arms and patient was sent out to [facility] to be medically cleared to return to [facility].    Recommendations  Based on my evaluation the patient does not appear to have an emergency medical condition. Patient will be admitted to [facility] for alcohol detox.   Jasper Riling, NP 09/30/22  6:34 AM

## 2022-09-30 NOTE — ED Notes (Signed)
Patient request medication for headache

## 2022-09-30 NOTE — Group Note (Signed)
Group Topic: Recovery Basics  Group Date: 09/29/2022 Start Time: 0740 End Time: 0930 Facilitators: Rae Lips B  Department: [facility]  Number of Participants: 7  Group Focus: abuse issues and activities of daily living skills Treatment Modality:  Leisure Development Interventions utilized were leisure development Purpose: enhance coping skills and express feelings  Name: Derek Cruz Date of Birth: Jan 06, 1966  MR: [id number]    Level of Participation: NA  Quality of Participation: wasn't here went to the hospital didn't join group. Interactions with others: NA Mood/Affect: NA Triggers (if applicable):  NA Cognition: NA Progress: Other Response: NA Plan: patient will be encouraged to keep going to groups  Patients Problems:  Patient Active Problem List   Diagnosis Date Noted   Elevated liver enzymes    Alcohol abuse    Liver failure (HCC) 05/07/2020   Alcoholic hepatitis    Hyponatremia    Polycythemia    Hemoglobin A1c less than 7.0% 07/16/2019   Neck pain 03/31/2019   Muscle strain 03/31/2019   Undifferentiated abdominal pain 03/31/2019   Gastroesophageal reflux disease without esophagitis 11/24/2018   Chest discomfort 11/24/2018   Essential hypertension 11/24/2018   Pre-diabetes 11/24/2018   Hyperlipidemia 10/06/2016   Chest pain 09/26/2016   Diabetes mellitus type 2, controlled (HCC) 09/26/2016

## 2022-09-30 NOTE — Group Note (Signed)
Group Topic: Wellness  Group Date: 09/30/2022 Start Time: 1135 End Time: 1205 Facilitators: Maeola Sarah  Department: [facility]  Number of Participants: 8  Group Focus: coping skills Treatment Modality:  Psychoeducation Interventions utilized were patient education Purpose: enhance coping skills and increase insight  Name: Derek Cruz Date of Birth: August 11, 1965  MR: [id number]    Level of Participation: active Quality of Participation: attentive, cooperative, and engaged Interactions with others: positive and respectful Mood/Affect: appropriate and positive Triggers (if applicable): N/A Cognition: coherent/clear Progress: Gaining insight Response: Patient discussed with staff and group members what coping skills are and when they are used as well as examples of negative & positive coping skills.  Plan: patient will be encouraged to continue attending groups  Patients Problems:  Patient Active Problem List   Diagnosis Date Noted   Elevated liver enzymes    Alcohol abuse    Liver failure (HCC) 05/07/2020   Alcoholic hepatitis    Hyponatremia    Polycythemia    Hemoglobin A1c less than 7.0% 07/16/2019   Neck pain 03/31/2019   Muscle strain 03/31/2019   Undifferentiated abdominal pain 03/31/2019   Gastroesophageal reflux disease without esophagitis 11/24/2018   Chest discomfort 11/24/2018   Essential hypertension 11/24/2018   Pre-diabetes 11/24/2018   Hyperlipidemia 10/06/2016   Chest pain 09/26/2016   Diabetes mellitus type 2, controlled (HCC) 09/26/2016

## 2022-09-30 NOTE — ED Notes (Signed)
Received patient this AM. Patient in his bed sleeping. Patient respirations are even and unlabored. Will continue to monitor for safety. 

## 2022-09-30 NOTE — ED Notes (Signed)
Patient was provided with breakfast 

## 2022-09-30 NOTE — ED Notes (Signed)
Patient was provided with lunch 

## 2022-09-30 NOTE — ED Notes (Signed)
Patient was provided with dinner 

## 2022-09-30 NOTE — ED Notes (Signed)
Patient resting quietly in bed with eyes closed. Respirations equal and unlabored, skin warm and dry, NAD. No change in assessment or acuity. Q 15 minute safety checks remain in place.   

## 2022-09-30 NOTE — ED Notes (Signed)
Patient resting in room in no sxs of distress - will continue to monitor for safety 

## 2022-10-01 ENCOUNTER — Other Ambulatory Visit: Payer: Self-pay

## 2022-10-01 DIAGNOSIS — F1020 Alcohol dependence, uncomplicated: Secondary | ICD-10-CM | POA: Diagnosis not present

## 2022-10-01 LAB — GLUCOSE, CAPILLARY
Glucose-Capillary: 156 mg/dL — ABNORMAL HIGH (ref 70–99)
Glucose-Capillary: 186 mg/dL — ABNORMAL HIGH (ref 70–99)
Glucose-Capillary: 139 mg/dL — ABNORMAL HIGH (ref 70–99)

## 2022-10-01 MED ORDER — METFORMIN HCL ER 750 MG PO TB24
1500.0000 mg | ORAL_TABLET | Freq: Every day | ORAL | 0 refills | Status: DC
  Filled 2022-10-01: qty 60, 30d supply, fill #0

## 2022-10-01 MED ORDER — INSULIN GLARGINE-YFGN 100 UNIT/ML ~~LOC~~ SOLN
18.0000 [IU] | Freq: Every day | SUBCUTANEOUS | Status: DC
  Administered 2022-10-01: 18 [IU] via SUBCUTANEOUS

## 2022-10-01 MED ORDER — TRAZODONE HCL 50 MG PO TABS
50.0000 mg | ORAL_TABLET | Freq: Every evening | ORAL | 0 refills | Status: DC | PRN
  Filled 2022-10-01 (×2): qty 30, 30d supply, fill #0

## 2022-10-01 MED ORDER — INSULIN GLARGINE-YFGN 100 UNIT/ML ~~LOC~~ SOLN: 20.0000 [IU] | Freq: Every day | SUBCUTANEOUS | Status: DC

## 2022-10-01 MED ORDER — METFORMIN HCL ER 750 MG PO TB24
1500.0000 mg | ORAL_TABLET | Freq: Every day | ORAL | Status: DC
  Administered 2022-10-01: 1500 mg via ORAL
  Filled 2022-10-01: qty 2
  Filled 2022-10-01: qty 14

## 2022-10-01 MED ORDER — INSULIN GLARGINE 100 UNITS/ML SOLOSTAR PEN
20.0000 [IU] | PEN_INJECTOR | Freq: Every day | SUBCUTANEOUS | 11 refills | Status: DC
  Filled 2022-10-01: qty 6, 30d supply, fill #0

## 2022-10-01 MED ORDER — INSULIN GLARGINE 100 UNITS/ML SOLOSTAR PEN
20.0000 [IU] | PEN_INJECTOR | Freq: Every day | SUBCUTANEOUS | Status: DC
  Filled 2022-10-01: qty 15

## 2022-10-01 NOTE — ED Notes (Signed)
Patient was provided with breakfast 

## 2022-10-01 NOTE — ED Notes (Signed)
The patient is currently siting in his room, no distress noted, will continue to monitor patient for safety.

## 2022-10-01 NOTE — ED Notes (Signed)
Patient was provided with lunch 
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
## 2022-10-01 NOTE — ED Notes (Addendum)
Patient observed/assessed in room in bed appearing in no immediate distress resting peacefully. Q15 minute checks continued by MHT and nursing staff. Will continue to monitor and support. 

## 2022-10-01 NOTE — ED Notes (Signed)
Patient is currently in the shower, no distress noted, will continue to monitor patient for safety

## 2022-10-01 NOTE — ED Notes (Signed)
Patients blood sugar is 186

## 2022-10-01 NOTE — ED Notes (Signed)
While speaking with provider this AM he asked if patient could be educated on how to administer insulin. This nurse explained to patient how to check his blood sugar as he stated he has a machine at home. The patient has received insulin twice thus today, during each administration I explained the process to the patient. I also showed him the different locations on his body where the insulin can be administered. Pharmacy has also provided a diabetes information book in the patients first language (spanish). Lantus solostar for home use has been provided by pharmacy. This nurse will go over how patient should use it.

## 2022-10-01 NOTE — Discharge Instructions (Addendum)
Dear Derek Cruz,  It was a pleasure to take care of you during your stay at [facility] where you were treated for your Alcohol abuse.  While you were here, you were:  observed and cared for by our nurses and nursing assistants  treated with medications by your psychiatrists  evaluated with imaging / lab tests, and treated with medicines / procedures by your doctors  provided individual and group therapy by therapists  provided resources by our social workers and case managers  Please review the medication list provided to you at discharge and stop, start taking, or continue taking the medications listed there.  You should also follow-up with your primary care doctor, or start seeing one if you don't have one yet. If applicable, here are some scheduled follow-ups for you:  - Internal Medicine Teaching Service (IMTS) Outpatient Clinic on 10/05/2022 at 8:45 am   I recommend abstinence from alcohol, tobacco, and other illicit drug use.   If your psychiatric symptoms or suicidal thoughts recur, worsen, or if you have side effects to your psychiatric medications, call your outpatient psychiatric provider, 911, [phone] or go to the nearest emergency department.  Take care!  Signed: Augusto Gamble, MD 10/01/2022, 4:04 PM  Naloxone (Narcan) can help reverse an overdose when given to the victim quickly.  [facility] offers free naloxone kits and instructions/training on its use.  Add naloxone to your first aid kit and you can help save a life. A prescription can be filled at your local pharmacy or free kits are provided by the county.  Pick up your free kit at the following locations:   [place]:  [facility] Division of [facility], [address] [place] [address] [phone]) [facility] [address] [place] [address] [phone]) [facility] [address] [place]  [address]  [place]: [facility] Division of [facility] [address] [place] [address] ([phone]) [facility] [address] [place] [address] [phone])

## 2022-10-05 ENCOUNTER — Encounter: Payer: Self-pay | Admitting: Student

## 2022-10-08 ENCOUNTER — Other Ambulatory Visit: Payer: Self-pay

## 2022-12-01 ENCOUNTER — Encounter (HOSPITAL_COMMUNITY): Payer: Self-pay | Admitting: Emergency Medicine

## 2022-12-01 ENCOUNTER — Emergency Department (HOSPITAL_COMMUNITY): Payer: Self-pay

## 2022-12-01 ENCOUNTER — Other Ambulatory Visit: Payer: Self-pay

## 2022-12-01 ENCOUNTER — Inpatient Hospital Stay (HOSPITAL_COMMUNITY)
Admission: EM | Admit: 2022-12-01 | Discharge: 2022-12-06 | DRG: 897 | Disposition: A | Discharge status: Home / Self Care | Payer: Self-pay | Attending: Internal Medicine | Admitting: Internal Medicine

## 2022-12-01 DIAGNOSIS — E871 Hypo-osmolality and hyponatremia: Secondary | ICD-10-CM | POA: Diagnosis not present

## 2022-12-01 DIAGNOSIS — Z781 Physical restraint status: Secondary | ICD-10-CM

## 2022-12-01 DIAGNOSIS — R54 Age-related physical debility: Secondary | ICD-10-CM | POA: Diagnosis present

## 2022-12-01 DIAGNOSIS — E872 Acidosis, unspecified: Secondary | ICD-10-CM | POA: Diagnosis present

## 2022-12-01 DIAGNOSIS — F10221 Alcohol dependence with intoxication delirium: Secondary | ICD-10-CM | POA: Diagnosis present

## 2022-12-01 DIAGNOSIS — G40509 Epileptic seizures related to external causes, not intractable, without status epilepticus: Secondary | ICD-10-CM | POA: Diagnosis present

## 2022-12-01 DIAGNOSIS — Z9181 History of falling: Secondary | ICD-10-CM

## 2022-12-01 DIAGNOSIS — Z7984 Long term (current) use of oral hypoglycemic drugs: Secondary | ICD-10-CM

## 2022-12-01 DIAGNOSIS — W19XXXA Unspecified fall, initial encounter: Secondary | ICD-10-CM | POA: Diagnosis present

## 2022-12-01 DIAGNOSIS — Z79899 Other long term (current) drug therapy: Secondary | ICD-10-CM

## 2022-12-01 DIAGNOSIS — F10939 Alcohol use, unspecified with withdrawal, unspecified: Secondary | ICD-10-CM

## 2022-12-01 DIAGNOSIS — F10231 Alcohol dependence with withdrawal delirium: Principal | ICD-10-CM | POA: Diagnosis present

## 2022-12-01 DIAGNOSIS — F1090 Alcohol use, unspecified, uncomplicated: Secondary | ICD-10-CM | POA: Diagnosis present

## 2022-12-01 DIAGNOSIS — E785 Hyperlipidemia, unspecified: Secondary | ICD-10-CM | POA: Diagnosis present

## 2022-12-01 DIAGNOSIS — Z87891 Personal history of nicotine dependence: Secondary | ICD-10-CM

## 2022-12-01 DIAGNOSIS — R7401 Elevation of levels of liver transaminase levels: Secondary | ICD-10-CM | POA: Diagnosis present

## 2022-12-01 DIAGNOSIS — I10 Essential (primary) hypertension: Secondary | ICD-10-CM | POA: Diagnosis present

## 2022-12-01 DIAGNOSIS — Y907 Blood alcohol level of 200-239 mg/100 ml: Secondary | ICD-10-CM | POA: Diagnosis present

## 2022-12-01 DIAGNOSIS — R296 Repeated falls: Secondary | ICD-10-CM

## 2022-12-01 DIAGNOSIS — Z794 Long term (current) use of insulin: Secondary | ICD-10-CM

## 2022-12-01 DIAGNOSIS — F10230 Alcohol dependence with withdrawal, uncomplicated: Secondary | ICD-10-CM | POA: Diagnosis present

## 2022-12-01 DIAGNOSIS — E1165 Type 2 diabetes mellitus with hyperglycemia: Secondary | ICD-10-CM | POA: Diagnosis present

## 2022-12-01 LAB — COMPREHENSIVE METABOLIC PANEL
ALT: 43 U/L (ref 0–44)
AST: 83 U/L — ABNORMAL HIGH (ref 15–41)
Albumin: 3.9 g/dL (ref 3.5–5.0)
Alkaline Phosphatase: 99 U/L (ref 38–126)
Anion gap: 16 — ABNORMAL HIGH (ref 5–15)
BUN: 6 mg/dL (ref 6–20)
CO2: 22 mmol/L (ref 22–32)
Calcium: 8.2 mg/dL — ABNORMAL LOW (ref 8.9–10.3)
Chloride: 96 mmol/L — ABNORMAL LOW (ref 98–111)
Creatinine, Ser: 0.69 mg/dL (ref 0.61–1.24)
GFR, Estimated: >60 mL/min (ref >60)
Glucose, Bld: 147 mg/dL — ABNORMAL HIGH (ref 70–99)
Potassium: 3.2 mmol/L — ABNORMAL LOW (ref 3.5–5.1)
Sodium: 134 mmol/L — ABNORMAL LOW (ref 135–145)
Total Bilirubin: 0.9 mg/dL (ref 0.3–1.2)
Total Protein: 7.3 g/dL (ref 6.5–8.1)

## 2022-12-01 LAB — I-STAT VENOUS BLOOD GAS, ED
Acid-base deficit: 4 mmol/L — ABNORMAL HIGH (ref 0.0–2.0)
Bicarbonate: 21.4 mmol/L (ref 20.0–28.0)
Calcium, Ion: 0.95 mmol/L — ABNORMAL LOW (ref 1.15–1.40)
HCT: 37 % — ABNORMAL LOW (ref 39.0–52.0)
Hemoglobin: 12.6 g/dL — ABNORMAL LOW (ref 13.0–17.0)
O2 Saturation: 99 %
Potassium: 3.4 mmol/L — ABNORMAL LOW (ref 3.5–5.1)
Sodium: 137 mmol/L (ref 135–145)
TCO2: 22 mmol/L (ref 22–32)
pCO2, Ven: 37.1 mmHg — ABNORMAL LOW (ref 44–60)
pH, Ven: 7.368 (ref 7.25–7.43)
pO2, Ven: 164 mmHg — ABNORMAL HIGH (ref 32–45)

## 2022-12-01 LAB — CBC WITH DIFFERENTIAL/PLATELET
Abs Immature Granulocytes: 0.02 10*3/uL (ref 0.00–0.07)
Basophils Absolute: 0.1 10*3/uL (ref 0.0–0.1)
Basophils Relative: 1 %
Eosinophils Absolute: 0 10*3/uL (ref 0.0–0.5)
Eosinophils Relative: 0 %
HCT: 40.4 % (ref 39.0–52.0)
Hemoglobin: 14.6 g/dL (ref 13.0–17.0)
Immature Granulocytes: 0 %
Lymphocytes Relative: 14 %
Lymphs Abs: 1.1 10*3/uL (ref 0.7–4.0)
MCH: 31.7 pg (ref 26.0–34.0)
MCHC: 36.1 g/dL — ABNORMAL HIGH (ref 30.0–36.0)
MCV: 87.8 fL (ref 80.0–100.0)
Monocytes Absolute: 0.4 10*3/uL (ref 0.1–1.0)
Monocytes Relative: 5 %
Neutro Abs: 6.3 10*3/uL (ref 1.7–7.7)
Neutrophils Relative %: 80 %
Platelets: 212 10*3/uL (ref 150–400)
RBC: 4.6 MIL/uL (ref 4.22–5.81)
RDW: 12.9 % (ref 11.5–15.5)
WBC: 7.9 10*3/uL (ref 4.0–10.5)
nRBC: 0 % (ref 0.0–0.2)

## 2022-12-01 LAB — I-STAT CHEM 8, ED
BUN: 5 mg/dL — ABNORMAL LOW (ref 6–20)
Calcium, Ion: 1.01 mmol/L — ABNORMAL LOW (ref 1.15–1.40)
Chloride: 98 mmol/L (ref 98–111)
Creatinine, Ser: 0.9 mg/dL (ref 0.61–1.24)
Glucose, Bld: 145 mg/dL — ABNORMAL HIGH (ref 70–99)
HCT: 44 % (ref 39.0–52.0)
Hemoglobin: 15 g/dL (ref 13.0–17.0)
Potassium: 3.3 mmol/L — ABNORMAL LOW (ref 3.5–5.1)
Sodium: 135 mmol/L (ref 135–145)
TCO2: 22 mmol/L (ref 22–32)

## 2022-12-01 LAB — URINALYSIS, ROUTINE W REFLEX MICROSCOPIC
Bacteria, UA: NONE SEEN
Bilirubin Urine: NEGATIVE
Glucose, UA: NEGATIVE mg/dL
Hgb urine dipstick: NEGATIVE
Ketones, ur: NEGATIVE mg/dL
Leukocytes,Ua: NEGATIVE
Nitrite: NEGATIVE
Protein, ur: NEGATIVE mg/dL
Specific Gravity, Urine: 1.028 (ref 1.005–1.030)
pH: 6 (ref 5.0–8.0)

## 2022-12-01 LAB — TROPONIN I (HIGH SENSITIVITY)
Troponin I (High Sensitivity): 8 ng/L (ref <18)
Troponin I (High Sensitivity): 8 ng/L (ref <18)

## 2022-12-01 LAB — ETHANOL: Alcohol, Ethyl (B): 220 mg/dL — ABNORMAL HIGH (ref <10)

## 2022-12-01 LAB — I-STAT CG4 LACTIC ACID, ED: Lactic Acid, Venous: 0.8 mmol/L (ref 0.5–1.9)

## 2022-12-01 LAB — CBG MONITORING, ED
Glucose-Capillary: 122 mg/dL — ABNORMAL HIGH (ref 70–99)
Glucose-Capillary: 109 mg/dL — ABNORMAL HIGH (ref 70–99)
Glucose-Capillary: 140 mg/dL — ABNORMAL HIGH (ref 70–99)
Glucose-Capillary: 110 mg/dL — ABNORMAL HIGH (ref 70–99)

## 2022-12-01 LAB — LACTIC ACID, PLASMA
Lactic Acid, Venous: 4 mmol/L (ref 0.5–1.9)
Lactic Acid, Venous: 3.7 mmol/L (ref 0.5–1.9)

## 2022-12-01 LAB — LIPASE, BLOOD: Lipase: 32 U/L (ref 11–51)

## 2022-12-01 LAB — HIV ANTIBODY (ROUTINE TESTING W REFLEX): HIV Screen 4th Generation wRfx: NONREACTIVE

## 2022-12-01 LAB — MAGNESIUM: Magnesium: 2.2 mg/dL (ref 1.7–2.4)

## 2022-12-01 MED ORDER — KCL IN DEXTROSE-NACL 20-5-0.9 MEQ/L-%-% IV SOLN
INTRAVENOUS | Status: DC
  Filled 2022-12-01: qty 1000

## 2022-12-01 MED ORDER — BISACODYL 5 MG PO TBEC: 5.0000 mg | DELAYED_RELEASE_TABLET | Freq: Every day | ORAL | Status: DC | PRN

## 2022-12-01 MED ORDER — ACETAMINOPHEN 325 MG PO TABS
650.0000 mg | ORAL_TABLET | Freq: Four times a day (QID) | ORAL | Status: DC | PRN
  Administered 2022-12-02 – 2022-12-03 (×6): 650 mg via ORAL
  Filled 2022-12-01 (×7): qty 2

## 2022-12-01 MED ORDER — ONDANSETRON HCL 4 MG/2ML IJ SOLN
4.0000 mg | Freq: Once | INTRAMUSCULAR | Status: AC
  Administered 2022-12-01: 4 mg via INTRAVENOUS
  Filled 2022-12-01: qty 2

## 2022-12-01 MED ORDER — DIAZEPAM 5 MG PO TABS
5.0000 mg | ORAL_TABLET | Freq: Four times a day (QID) | ORAL | Status: DC
  Administered 2022-12-01 – 2022-12-04 (×12): 5 mg via ORAL
  Filled 2022-12-01 (×12): qty 1

## 2022-12-01 MED ORDER — LORAZEPAM 1 MG PO TABS
1.0000 mg | ORAL_TABLET | ORAL | Status: AC | PRN
  Administered 2022-12-03: 2 mg via ORAL
  Administered 2022-12-04: 4 mg via ORAL
  Filled 2022-12-01: qty 2
  Filled 2022-12-01: qty 4

## 2022-12-01 MED ORDER — ACETAMINOPHEN 650 MG RE SUPP: 650.0000 mg | Freq: Four times a day (QID) | RECTAL | Status: DC | PRN

## 2022-12-01 MED ORDER — LORAZEPAM 2 MG/ML IJ SOLN
0.0000 mg | Freq: Three times a day (TID) | INTRAMUSCULAR | Status: DC
  Administered 2022-12-03: 2 mg via INTRAVENOUS
  Administered 2022-12-03: 3 mg via INTRAVENOUS
  Filled 2022-12-01: qty 1
  Filled 2022-12-01: qty 2

## 2022-12-01 MED ORDER — FOLIC ACID 1 MG PO TABS: 1.0000 mg | ORAL_TABLET | Freq: Every day | ORAL | Status: DC

## 2022-12-01 MED ORDER — THIAMINE HCL 100 MG/ML IJ SOLN
100.0000 mg | Freq: Once | INTRAMUSCULAR | Status: AC
  Administered 2022-12-01: 100 mg via INTRAVENOUS
  Filled 2022-12-01: qty 2

## 2022-12-01 MED ORDER — SODIUM CHLORIDE 0.9% FLUSH
3.0000 mL | Freq: Two times a day (BID) | INTRAVENOUS | Status: DC
  Administered 2022-12-01 – 2022-12-06 (×10): 3 mL via INTRAVENOUS

## 2022-12-01 MED ORDER — ENOXAPARIN SODIUM 40 MG/0.4ML IJ SOSY
40.0000 mg | PREFILLED_SYRINGE | INTRAMUSCULAR | Status: DC
  Administered 2022-12-01 – 2022-12-05 (×5): 40 mg via SUBCUTANEOUS
  Filled 2022-12-01 (×5): qty 0.4

## 2022-12-01 MED ORDER — HYDRALAZINE HCL 20 MG/ML IJ SOLN: 5.0000 mg | INTRAMUSCULAR | Status: DC | PRN

## 2022-12-01 MED ORDER — THIAMINE MONONITRATE 100 MG PO TABS
100.0000 mg | ORAL_TABLET | Freq: Every day | ORAL | Status: DC
  Administered 2022-12-03: 100 mg via ORAL
  Filled 2022-12-01 (×2): qty 1

## 2022-12-01 MED ORDER — LORAZEPAM 2 MG/ML IJ SOLN
0.0000 mg | INTRAMUSCULAR | Status: DC
  Administered 2022-12-01: 1 mg via INTRAVENOUS
  Administered 2022-12-01: 2 mg via INTRAVENOUS
  Administered 2022-12-01: 1 mg via INTRAVENOUS
  Administered 2022-12-02: 2 mg via INTRAVENOUS
  Administered 2022-12-02: 1 mg via INTRAVENOUS
  Administered 2022-12-02: 3 mg via INTRAVENOUS
  Administered 2022-12-03 (×2): 2 mg via INTRAVENOUS
  Filled 2022-12-01 (×4): qty 1
  Filled 2022-12-01: qty 2
  Filled 2022-12-01 (×5): qty 1

## 2022-12-01 MED ORDER — LORAZEPAM 2 MG/ML IJ SOLN
0.0000 mg | Freq: Four times a day (QID) | INTRAMUSCULAR | Status: DC
  Administered 2022-12-01: 4 mg via INTRAVENOUS
  Filled 2022-12-01: qty 2

## 2022-12-01 MED ORDER — LORAZEPAM 1 MG PO TABS: 0.0000 mg | ORAL_TABLET | Freq: Four times a day (QID) | ORAL | Status: DC

## 2022-12-01 MED ORDER — IOHEXOL 350 MG/ML SOLN
75.0000 mL | Freq: Once | INTRAVENOUS | Status: AC | PRN
  Administered 2022-12-01: 75 mL via INTRAVENOUS

## 2022-12-01 MED ORDER — LORAZEPAM 2 MG/ML IJ SOLN
1.0000 mg | INTRAMUSCULAR | Status: AC | PRN
  Administered 2022-12-02: 1 mg via INTRAVENOUS
  Administered 2022-12-03 – 2022-12-04 (×4): 2 mg via INTRAVENOUS
  Filled 2022-12-01 (×3): qty 1

## 2022-12-01 MED ORDER — LORAZEPAM 2 MG/ML IJ SOLN: 0.0000 mg | Freq: Two times a day (BID) | INTRAMUSCULAR | Status: DC

## 2022-12-01 MED ORDER — LACTATED RINGERS IV BOLUS
1000.0000 mL | Freq: Once | INTRAVENOUS | Status: AC
  Administered 2022-12-01: 1000 mL via INTRAVENOUS

## 2022-12-01 MED ORDER — SODIUM CHLORIDE 0.9 % IV BOLUS
1000.0000 mL | Freq: Once | INTRAVENOUS | Status: AC
  Administered 2022-12-01: 1000 mL via INTRAVENOUS

## 2022-12-01 MED ORDER — ONDANSETRON HCL 4 MG PO TABS
4.0000 mg | ORAL_TABLET | Freq: Four times a day (QID) | ORAL | Status: DC | PRN
  Filled 2022-12-01: qty 1

## 2022-12-01 MED ORDER — DOCUSATE SODIUM 100 MG PO CAPS
100.0000 mg | ORAL_CAPSULE | Freq: Two times a day (BID) | ORAL | Status: DC
  Administered 2022-12-01 – 2022-12-06 (×5): 100 mg via ORAL
  Filled 2022-12-01 (×8): qty 1

## 2022-12-01 MED ORDER — LACTATED RINGERS IV SOLN: INTRAVENOUS | Status: DC

## 2022-12-01 MED ORDER — THIAMINE HCL 100 MG/ML IJ SOLN
100.0000 mg | Freq: Every day | INTRAMUSCULAR | Status: DC
  Administered 2022-12-02: 100 mg via INTRAVENOUS
  Filled 2022-12-01: qty 2

## 2022-12-01 MED ORDER — ADULT MULTIVITAMIN W/MINERALS CH: 1.0000 | ORAL_TABLET | Freq: Every day | ORAL | Status: DC

## 2022-12-01 MED ORDER — LORAZEPAM 1 MG PO TABS: 0.0000 mg | ORAL_TABLET | Freq: Two times a day (BID) | ORAL | Status: DC

## 2022-12-01 MED ORDER — ONDANSETRON HCL 4 MG/2ML IJ SOLN
4.0000 mg | Freq: Four times a day (QID) | INTRAMUSCULAR | Status: DC | PRN
  Administered 2022-12-02 – 2022-12-03 (×2): 4 mg via INTRAVENOUS
  Filled 2022-12-01 (×4): qty 2

## 2022-12-01 MED ORDER — INSULIN ASPART 100 UNIT/ML IJ SOLN
0.0000 [IU] | Freq: Every day | INTRAMUSCULAR | Status: DC
  Administered 2022-12-05: 2 [IU] via SUBCUTANEOUS

## 2022-12-01 MED ORDER — FOLIC ACID 5 MG/ML IJ SOLN
1.0000 mg | Freq: Every day | INTRAMUSCULAR | Status: DC
  Administered 2022-12-01 – 2022-12-03 (×3): 1 mg via INTRAVENOUS
  Filled 2022-12-01 (×4): qty 0.2

## 2022-12-01 MED ORDER — ADULT MULTIVITAMIN W/MINERALS CH
1.0000 | ORAL_TABLET | Freq: Every day | ORAL | Status: DC
  Administered 2022-12-01 – 2022-12-06 (×6): 1 via ORAL
  Filled 2022-12-01 (×6): qty 1

## 2022-12-01 MED ORDER — DIAZEPAM 5 MG/ML IJ SOLN
5.0000 mg | Freq: Once | INTRAMUSCULAR | Status: AC
  Administered 2022-12-01: 5 mg via INTRAVENOUS
  Filled 2022-12-01: qty 2

## 2022-12-01 MED ORDER — INSULIN ASPART 100 UNIT/ML IJ SOLN
0.0000 [IU] | Freq: Three times a day (TID) | INTRAMUSCULAR | Status: DC
  Administered 2022-12-01 – 2022-12-02 (×2): 2 [IU] via SUBCUTANEOUS
  Administered 2022-12-02: 3 [IU] via SUBCUTANEOUS
  Administered 2022-12-02: 2 [IU] via SUBCUTANEOUS
  Administered 2022-12-03: 3 [IU] via SUBCUTANEOUS
  Administered 2022-12-03: 2 [IU] via SUBCUTANEOUS
  Administered 2022-12-03: 3 [IU] via SUBCUTANEOUS
  Administered 2022-12-04: 2 [IU] via SUBCUTANEOUS
  Administered 2022-12-04: 3 [IU] via SUBCUTANEOUS
  Administered 2022-12-05 – 2022-12-06 (×4): 2 [IU] via SUBCUTANEOUS

## 2022-12-01 MED ORDER — POLYETHYLENE GLYCOL 3350 17 G PO PACK: 17.0000 g | PACK | Freq: Every day | ORAL | Status: DC | PRN

## 2022-12-01 NOTE — ED Notes (Signed)
Notified EDP about pt CIWA 27

## 2022-12-01 NOTE — ED Notes (Signed)
Called pharmacy to have folic acid changed to IV
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
## 2022-12-01 NOTE — Discharge Instructions (Signed)
Abuso de sustancias para pacientes ambulatorios  Tratamiento: sin seguro  Narcticos Annimos LNEA DE AYUDA LAS 24 HORAS Pregrabada para los horarios de las reuniones REA DE PIEDEMONTE [phone]  [email or web address] ALCOHLICOS [facility] [place] de contestador [phone] Janie Morning en cuenta: Todas las reuniones de [employer] son para no fumadores [email or web address].es  Servicios de Alcohol y Facilities manager -  Seguro: Medicaid / Artist / Seguro privado [place], suboxone/Intensivo ambulatorio  [place] [phone] Telfono: [phone] [address] [place], [place], [address] [place] [phone] Fax: [phone] [address], [place], [place], [address] ([address] a [place], [place], [place], [place], [place], [place], De cualquier [place], [place]) Servicios de Cuidado [email or web address] Acepta fondos estatales/Medicaid Vivienda de transicin, Tratamiento ambulatorio intensivo, Gertha Calkin para veteranos  Telfono: [phone] Fax: [phone] Direccin: [address], [place] [address]   Crculo de Cuidado de Engineer, maintenance (IT) ([email or web address]/) Seguro: Medicaid Manejo de [place], Equipo de [place] Comunitario, Manejo de [place], [place], Rehabilitacin Psicosocial, Abuso de Sustancias Ambulatorio Intensivo  Telfono: [phone] Fax: [phone] [address], [place], [place], [address]  [place] Medicaid, la mayora de los proveedores de seguros privados Tipos de Programa: Terapia Individual/Grupal, Georgianne Fick de Sustancias  Telfono: [place] [phone] Fax: [phone] [address], [place], [place], [address] [place] [phone] [address] Gery Pray [place], [place], [address]  Nuevas progresiones, [facility]  Medicaid Tipos de ProgramaDarcella Gasman  Telfono: [phone] Fax: [phone] [address] [place], [place], [place], [address] RHA Medicaid/fondos estatales [place] de crisis [id number] [phone]) [phone] Ardis Rowan [address] [phone]  Conexiones Esenciales para la Vida [address];  [place], [place] [address] [phone]  Programa Ambulatorio Intensivo de Abuso de Sustancias Servicios de Evaluacin y [place] de OSA [address] [place], [place] [address] [phone]- Gerlean Ren por abuso de sustancias  Transiciones exitosas  Seguro: Medicaid del condado de [place], [place], escala mvil Tipos de programa: tratamiento para el abuso de sustancias, asistencia para Peyton Najjar Telfono: [phone] Fax: [phone] Direccin: [address], [place] [address] [place] ([email or web address]) Seguros: UHC, [place], [place], [place] Programa: Lajuana Carry, desintoxicacin,  Telfono: [phone] Fax: [phone] Direccin: [address] [place], [place] [address]  [facility] (instalaciones/programas en todo Payton Mccallum [place]) [address] (Medicaid/fondos estatales) [place], [place] [address] [email or web address] [phone] Marcy Panning- [phone] [place]- [phone] Servicios Familiares del [place] (2 ubicaciones) (Medicaid/fondos estatales) --[address] camine en 8:30-12 y 1-2:30 [place], [id number] Tel- [phone] --[address] [place], [place] [address] FK-[phone] camina en 8:30-12 y 2-3:30  Centro para la Salud Emocional [facility] Estatales/Medicaid [address] [place], [place] [address] [phone] Jeb Levering la Trada [facility] Suboxone) Medicaid/fondos estatales  [address] [place], [place] [address] [phone]   [facility]  [address], [place], [place] [address] [phone]) [place]- [address] [place], [place] [address] [phone] ([address]) [place]- [address] [place] [phone] Haynes D[place]- [address] [place] [phone] Turner Daniels [address] [place] [phone] [facility]- Medicaid y fondos estatales  [place]- [address] [place], [place] [address] [phone] (24 horas) [place]- [address] [place], [place] [address] [phone] Marga Melnick-  [address] [place], [place] [address] [phone] horas) [place] [address] [employer], [place] [address] [phone] [place]- [phone] Rd[address] [place] [phone]

## 2022-12-01 NOTE — ED Notes (Signed)
Derek Cruz (Son) called asking for a update [phone]

## 2022-12-01 NOTE — Progress Notes (Signed)
CSW received consult for substance abuse resources. CSW added resources in Spanish for patient to review.  Edwin Dada, MSW, LCSW Transitions of Care  Clinical Social Worker II [phone]

## 2022-12-01 NOTE — ED Provider Notes (Signed)
[name] Provider Note   CSN: [id number] Arrival date & time: 12/01/22  9147     History  Chief Complaint  Patient presents with   Fall    No Thinners + ETOH    Derek Cruz is a 57 y.o. male history of chronic alcohol use, type 2 diabetes, liver failure, hepatic hepatitis, hypertension, GERD presented after a fall last night.  Patient states he fell yesterday around 4 PM.  Patient stated he had a sixpack of alcohol yesterday from between 3 and 7 PM.  Patient states he drinks a sixpack daily.  Patient states he fell because it was wet outside and he landed on the back of his head and his butt.  Patient states the right side of his hip hurts and the backside of his head hurt.  Unable to obtain full history as patient is obviously intoxicated and is unsure if his answers   Home Medications Prior to Admission medications   Medication Sig Start Date End Date Taking? Authorizing Provider  atorvastatin (LIPITOR) 40 MG tablet Take 1 tablet (40 mg total) by mouth daily. 05/01/22 09/26/22  Rema Fendt, NP  lisinopril (ZESTRIL) 20 MG tablet Take 1 tablet (20 mg total) by mouth daily. 05/01/22 09/26/22  Rema Fendt, NP  metFORMIN (GLUCOPHAGE-XR) 750 MG 24 hr tablet Take 2 tablets (1,500 mg total) by mouth daily with breakfast. 10/02/22 11/01/22  Augusto Gamble, MD      Allergies    Patient has no known allergies.    Review of Systems   Review of Systems See HPI Physical Exam Updated Vital Signs BP 135/80   Pulse 89   Temp 98.7 F (37.1 C)   Resp 18   SpO2 94%  Physical Exam Vitals reviewed.  Constitutional:      General: He is not in acute distress. HENT:     Head: Normocephalic and atraumatic.     Comments: Posterior hematoma noted No step-off/crepitus/abnormalities palpated on schools    Right Ear: Tympanic membrane, ear canal and external ear normal.     Left Ear: Tympanic membrane, ear canal and external ear  normal.     Ears:     Comments: No hemotympanum noted bilaterally No mastoid ecchymosis or tenderness palpated bilaterally    Nose: Nose normal.     Mouth/Throat:     Mouth: Mucous membranes are moist.     Pharynx: No posterior oropharyngeal erythema.  Eyes:     Extraocular Movements: Extraocular movements intact.     Conjunctiva/sclera: Conjunctivae normal.     Pupils: Pupils are equal, round, and reactive to light.     Comments: No periorbital ecchymosis noted  Cardiovascular:     Rate and Rhythm: Regular rhythm. Tachycardia present.     Pulses: Normal pulses.     Heart sounds: Normal heart sounds.     Comments: 2+ bilateral radial/dorsalis pedis pulses with regular rate Pulmonary:     Effort: No respiratory distress.     Breath sounds: Normal breath sounds.     Comments: Increased work of breathing Tachypneic Abdominal:     Palpations: Abdomen is soft.     Tenderness: There is no abdominal tenderness. There is no guarding or rebound.  Musculoskeletal:        General: Normal range of motion.     Cervical back: Normal range of motion and neck supple. No tenderness.     Comments: 5 out of 5 bilateral grip/leg extension strength  Tenderness to palpation when palpating bilateral ribs; no step-off/crepitus/abnormalities palpated on the ribs No midline tenderness in spine; no step-off/crepitus/abnormalities palpated  Skin:    General: Skin is warm and dry.     Capillary Refill: Capillary refill takes less than 2 seconds.  Neurological:     Mental Status: He is alert and oriented to person, place, and time.     Comments: Sensation intact in all 4 limbs Tongue fasciculations noted Obvious body tremors noted Unable to fully assess patient cannot follow all commands  Psychiatric:        Mood and Affect: Mood normal.     ED Results / Procedures / Treatments   Labs (all labs ordered are listed, but only abnormal results are displayed) Labs Reviewed  COMPREHENSIVE METABOLIC  PANEL - Abnormal; Notable for the following components:      Result Value   Sodium 134 (*)    Potassium 3.2 (*)    Chloride 96 (*)    Glucose, Bld 147 (*)    Calcium 8.2 (*)    AST 83 (*)    Anion gap 16 (*)    All other components within normal limits  CBC WITH DIFFERENTIAL/PLATELET - Abnormal; Notable for the following components:   MCHC 36.1 (*)    All other components within normal limits  URINALYSIS, ROUTINE W REFLEX MICROSCOPIC - Abnormal; Notable for the following components:   Color, Urine STRAW (*)    All other components within normal limits  ETHANOL - Abnormal; Notable for the following components:   Alcohol, Ethyl (B) 220 (*)    All other components within normal limits  LACTIC ACID, PLASMA - Abnormal; Notable for the following components:   Lactic Acid, Venous 4.0 (*)    All other components within normal limits  CBG MONITORING, ED - Abnormal; Notable for the following components:   Glucose-Capillary 122 (*)    All other components within normal limits  I-STAT CHEM 8, ED - Abnormal; Notable for the following components:   Potassium 3.3 (*)    BUN 5 (*)    Glucose, Bld 145 (*)    Calcium, Ion 1.01 (*)    All other components within normal limits  I-STAT VENOUS BLOOD GAS, ED - Abnormal; Notable for the following components:   pCO2, Ven 37.1 (*)    pO2, Ven 164 (*)    Acid-base deficit 4.0 (*)    Potassium 3.4 (*)    Calcium, Ion 0.95 (*)    HCT 37.0 (*)    Hemoglobin 12.6 (*)    All other components within normal limits  LIPASE, BLOOD  MAGNESIUM  RAPID URINE DRUG SCREEN, HOSP PERFORMED  HIV ANTIBODY (ROUTINE TESTING W REFLEX)  LACTIC ACID, PLASMA  TROPONIN I (HIGH SENSITIVITY)  TROPONIN I (HIGH SENSITIVITY)    EKG EKG Interpretation Date/Time:  Saturday December 01 2022 07:30:19 EDT Ventricular Rate:  95 PR Interval:  143 QRS Duration:  104 QT Interval:  381 QTC Calculation: 479 R Axis:   -39  Text Interpretation: Sinus rhythm Left axis deviation ST-t  wave abnormality Abnormal ECG Confirmed by Gerhard Munch ([id number]) on 12/01/2022 7:45:13 AM  Radiology CT CHEST ABDOMEN PELVIS W CONTRAST  Result Date: 12/01/2022 CLINICAL DATA:  57 year old male status post multiple falls in the past 2 days. Pain. EXAM: CT CHEST, ABDOMEN, AND PELVIS WITH CONTRAST TECHNIQUE: Multidetector CT imaging of the chest, abdomen and pelvis was performed following the standard protocol during bolus administration of intravenous contrast. RADIATION DOSE REDUCTION: This exam  was performed according to the departmental dose-optimization program which includes automated exposure control, adjustment of the mA and/or kV according to patient size and/or use of iterative reconstruction technique. CONTRAST:  75mL OMNIPAQUE IOHEXOL 350 MG/ML SOLN COMPARISON:  CT Abdomen and Pelvis 05/07/2020. Cervical spine CT today reported separately. FINDINGS: CT CHEST FINDINGS Cardiovascular: Intact thoracic aorta. No periaortic hematoma. Normal heart size. No pericardial effusion. Other central mediastinal vascular structures appear intact. Mediastinum/Nodes: Negative. No mediastinal hematoma, lymphadenopathy, mass. Lungs/Pleura: Major airways are patent. Largely normal lung volumes and clear lungs. There is minor dependent atelectasis. No pneumothorax, pleural effusion, or pulmonary contusion. Musculoskeletal: Bulky left acromial osteophytosis. But the visible shoulder osseous structures appear intact. No sternal fracture identified. No rib fracture identified. No thoracic vertebral fracture identified. CT ABDOMEN PELVIS FINDINGS Hepatobiliary: Chronic fatty liver disease. No liver or gallbladder injury, perihepatic fluid identified. Pancreas: Negative. Spleen: Diminutive and intact.  No perisplenic fluid. Adrenals/Urinary Tract: Negative; adrenal glands and kidneys appear normal. Distended but otherwise unremarkable urinary bladder. Occasional pelvic phleboliths. Stomach/Bowel: No dilated large or small  bowel. Redundant sigmoid colon. Mild retained stool. Normal appendix series 4, image 92. Small volume retained fluid in the stomach. Duodenum appears negative. No free air or free fluid identified. Vascular/Lymphatic: Major arterial structures in the abdomen and pelvis appear patent and normal. No lymphadenopathy identified. Portal venous system appears to be patent. Reproductive: Negative. Other: No pelvis free fluid. Musculoskeletal: Chronic lumbar spine disc and endplate degeneration. Occasional vacuum disc. Mild chronic retrolisthesis of L1 on L2. Lower lumbar facet degeneration. Lumbar vertebrae, sacrum, SI joints, pelvis, and proximal femurs appear intact. Mild posterior left flank subcutaneous contusion series 4, image 84. No other superficial soft tissue injury identified. IMPRESSION: 1. Mild posterior left flank subcutaneous contusion. 2. No other acute traumatic injury identified in the chest, abdomen, or pelvis. 3. Chronic fatty liver disease. Electronically Signed   By: Odessa Fleming M.D.   On: 12/01/2022 08:11   CT Cervical Spine Wo Contrast  Result Date: 12/01/2022 CLINICAL DATA:  57 year old male status post multiple falls in the past 2 days. Pain. EXAM: CT CERVICAL SPINE WITHOUT CONTRAST TECHNIQUE: Multidetector CT imaging of the cervical spine was performed without intravenous contrast. Multiplanar CT image reconstructions were also generated. RADIATION DOSE REDUCTION: This exam was performed according to the departmental dose-optimization program which includes automated exposure control, adjustment of the mA and/or kV according to patient size and/or use of iterative reconstruction technique. COMPARISON:  CT head and face today reported separately. FINDINGS: Alignment: Straightening of cervical lordosis. Cervicothoracic junction alignment is within normal limits. Bilateral posterior element alignment is within normal limits. Skull base and vertebrae: Bone mineralization is within normal limits.  Visualized skull base is intact. No atlanto-occipital dissociation. C1 and C2 appear intact and aligned. Motion artifact most pronounced at C4 and C5. No acute osseous abnormality identified. Soft tissues and spinal canal: No prevertebral fluid or swelling. No visible canal hematoma. Larynx and pharynx motion artifact. Disc levels: Widespread cervical disc and endplate degeneration. 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 Upper chest: Chest CT today reported separately. IMPRESSION: 1. Mildly motion degraded. No acute traumatic injury identified in the cervical spine. 2. Widespread cervical disc and endplate degeneration.  Early OPLL. Electronically Signed   By: Odessa Fleming M.D.   On: 12/01/2022 08:04   CT Maxillofacial WO CM  Result Date: 12/01/2022 CLINICAL DATA:  57 year old male status post multiple falls in the past 2 days. Pain. EXAM: CT  MAXILLOFACIAL WITHOUT CONTRAST TECHNIQUE: Multidetector CT imaging of the maxillofacial structures was performed. Multiplanar CT image reconstructions were also generated. RADIATION DOSE REDUCTION: This exam was performed according to the departmental dose-optimization program which includes automated exposure control, adjustment of the mA and/or kV according to patient size and/or use of iterative reconstruction technique. COMPARISON:  CT head and cervical spine today reported separately. FINDINGS: Osseous: Mandible intact and normally located. No acute dental finding identified. Bilateral maxilla, zygoma, pterygoid, and nasal bones appear intact. Central skull base intact. Cervical spine detailed separately. Orbits: Chronic lamina papyracea fracture on the left. No acute orbital wall fracture. Globes and intraorbital soft tissues appear normal. Sinuses: Well aerated throughout. Tympanic cavities and mastoids appear clear. Soft tissues: Negative visible noncontrast deep soft tissue spaces of the face. No superficial soft tissue  injury is identified. Limited intracranial: Reported separately today. IMPRESSION: 1.  No acute traumatic injury identified in the Face. 2. Chronic left lamina papyracea fracture. Electronically Signed   By: Odessa Fleming M.D.   On: 12/01/2022 08:01   DG Chest Port 1 View  Result Date: 12/01/2022 CLINICAL DATA:  57 year old male status post multiple falls in the past 2 days. Pain. EXAM: PORTABLE CHEST 1 VIEW COMPARISON:  Chest radiographs 09/23/2022 and earlier. FINDINGS: Portable AP upright view at 0720 hours. Lung volumes are within normal limits today. Normal cardiac size and mediastinal contours. Visualized tracheal air column is within normal limits. Allowing for portable technique the lungs are clear. No acute osseous abnormality identified. Negative visible bowel gas. IMPRESSION: Negative portable chest. Electronically Signed   By: Odessa Fleming M.D.   On: 12/01/2022 07:51   CT Head Wo Contrast  Result Date: 12/01/2022 CLINICAL DATA:  57 year old male status post multiple falls in the past 2 days. Pain. EXAM: CT HEAD WITHOUT CONTRAST TECHNIQUE: Contiguous axial images were obtained from the base of the skull through the vertex without intravenous contrast. RADIATION DOSE REDUCTION: This exam was performed according to the departmental dose-optimization program which includes automated exposure control, adjustment of the mA and/or kV according to patient size and/or use of iterative reconstruction technique. COMPARISON:  Head CT and brain MRI 09/29/2022. FINDINGS: Brain: Partially empty sella redemonstrated. Background brain volume within normal limits for age. No midline shift, ventriculomegaly, mass effect, evidence of mass lesion, intracranial hemorrhage or evidence of cortically based acute infarction. Gray-white matter differentiation is within normal limits throughout the brain. Vascular: No suspicious intracranial vascular hyperdensity. Mild Calcified atherosclerosis at the skull base. Skull: Chronic left  lamina papyracea fracture. No acute osseous abnormality identified. Sinuses/Orbits: Visualized paranasal sinuses and mastoids are stable and well aerated. Other: Broad-based posterior vertex and left convexity scalp hematoma (series 4, image 72). And there is trace associated scalp soft tissue gas. Underlying calvarium appears intact. Orbits soft tissues appears stable. IMPRESSION: 1. Posterior scalp hematoma, laceration with no underlying skull fracture. 2. Stable and negative for age noncontrast CT appearance of the brain. Electronically Signed   By: Odessa Fleming M.D.   On: 12/01/2022 07:50    Procedures .Critical Care  Performed by: Netta Corrigan, PA-C Authorized by: Netta Corrigan, PA-C   Critical care provider statement:    Critical care time (minutes):  40   Critical care time was exclusive of:  Separately billable procedures and treating other patients   Critical care was necessary to treat or prevent imminent or life-threatening deterioration of the following conditions:  Toxidrome   Critical care was time spent personally by me on the  following activities:  Development of treatment plan with patient or surrogate, discussions with consultants, evaluation of patient's response to treatment, examination of patient, obtaining history from patient or surrogate, review of old charts, re-evaluation of patient's condition, pulse oximetry, ordering and review of radiographic studies, ordering and review of laboratory studies and ordering and performing treatments and interventions   I assumed direction of critical care for this patient from another provider in my specialty: no     Care discussed with: admitting provider       Medications Ordered in ED Medications  folic acid injection 1 mg (has no administration in time range)  LORazepam (ATIVAN) tablet 1-4 mg (has no administration in time range)    Or  LORazepam (ATIVAN) injection 1-4 mg (has no administration in time range)  thiamine  (VITAMIN B1) tablet 100 mg (has no administration in time range)    Or  thiamine (VITAMIN B1) injection 100 mg (has no administration in time range)  multivitamin with minerals tablet 1 tablet (has no administration in time range)  insulin aspart (novoLOG) injection 0-15 Units (has no administration in time range)  insulin aspart (novoLOG) injection 0-5 Units (has no administration in time range)  enoxaparin (LOVENOX) injection 40 mg (has no administration in time range)  sodium chloride flush (NS) 0.9 % injection 3 mL (has no administration in time range)  lactated ringers infusion (has no administration in time range)  acetaminophen (TYLENOL) tablet 650 mg (has no administration in time range)    Or  acetaminophen (TYLENOL) suppository 650 mg (has no administration in time range)  docusate sodium (COLACE) capsule 100 mg (has no administration in time range)  polyethylene glycol (MIRALAX / GLYCOLAX) packet 17 g (has no administration in time range)  bisacodyl (DULCOLAX) EC tablet 5 mg (has no administration in time range)  ondansetron (ZOFRAN) tablet 4 mg (has no administration in time range)    Or  ondansetron (ZOFRAN) injection 4 mg (has no administration in time range)  hydrALAZINE (APRESOLINE) injection 5 mg (has no administration in time range)  LORazepam (ATIVAN) injection 0-4 mg (has no administration in time range)    Followed by  LORazepam (ATIVAN) injection 0-4 mg (has no administration in time range)  diazepam (VALIUM) tablet 5 mg (has no administration in time range)  sodium chloride 0.9 % bolus 1,000 mL (0 mLs Intravenous Stopped 12/01/22 0838)  ondansetron (ZOFRAN) injection 4 mg (4 mg Intravenous Given 12/01/22 0702)  thiamine (VITAMIN B1) injection 100 mg (100 mg Intravenous Given 12/01/22 0702)  diazepam (VALIUM) injection 5 mg (5 mg Intravenous Given 12/01/22 0704)  iohexol (OMNIPAQUE) 350 MG/ML injection 75 mL (75 mLs Intravenous Contrast Given 12/01/22 0727)  diazepam  (VALIUM) injection 5 mg (5 mg Intravenous Given 12/01/22 0908)  ondansetron (ZOFRAN) injection 4 mg (4 mg Intravenous Given 12/01/22 1101)    ED Course/ Medical Decision Making/ A&P                             Medical Decision Making Amount and/or Complexity of Data Reviewed Labs: ordered. Radiology: ordered.  Risk Prescription drug management. Decision regarding hospitalization.   Jemarcus Meaker Tolentino 57 y.o. presented today for intoxication, fall, head pain, left hip pain. Working DDx that I considered at this time includes, but not limited to, alcohol intoxication, ICH, epidural/subdural hematoma, basal skull fracture, cervical neck fracture/dislocation, hip fracture, alcohol withdrawal, dehydration, anemia, electrolyte abnormalities, UTI, sepsis, ACS, pneumothorax.  Review of prior  external notes: 09/29/2022  Unique Tests and My Interpretation:  CBC: Unremarkable CMP: Hypokalemia 3.2, anion gap 16 UA: pending I-STAT Chem-8: Hypokalemia 3.3 UDS: pending VBG: unremarkable Lactic acid: 4.0 EKG: Rate, rhythm, axis, intervals all examined and without medically relevant abnormality. ST segments without concerns for elevations CT chest abdomen pelvis with contrast: Mild left flank contusion CT cervical spine: No acute changes CT maxillofacial: No acute changes CT head without contrast: Posterior hematoma noted Chest x-ray: No acute cardiopulmonary changes  Discussion with Independent Historian: None  Discussion of Management of Tests:  Yates, MD Hospitalist  Risk: High: hospitalization or escalation of hospital-level care  Risk Stratification Score: CIWA 7  Staffed with Jeraldine Loots, MD  R/o DDx: ICH, epidural/subdural hematoma, basal skull fracture, cervical neck fracture/dislocation, hip fracture, dehydration, anemia, electrolyte abnormalities, UTI, sepsis, ACS, pneumothorax: These diagnoses are not consistent with patient's history, physical exam,  lab/imaging  Plan: On exam patient was clearly intoxicated and unable to provide full history.  Patient was tender in bilateral ribs with palpation, was tender in the posterior head with a hematoma, tender to palpation of the left pelvis however no abnormalities were palpated with these.  Patient was complaining of chest pain and with his recent fall altered mental status and tachypnea a CT scan was ordered to fully evaluate for possible pneumothorax.  Patient also will get a CT head and neck as he did fall and hit his head.  On exam patient had tongue fasciculations and was having obvious tremors.  Patient last had alcohol at 7 PM last night and states he drinks a sixpack a day for as long as he can remember.  Patient was given Valium along with thiamine and fluids as he may be going into alcohol withdrawal.  Patient will also have Zofran as he is endorsing nausea.  Patient stable at this time.  Patient's potassium did come back slightly decreased however patient still appears intoxicated and endorsing nausea and so potassium will be withheld at this time.  Patient's ethanol is elevated to 20 which would explain patient's slight anion gap of 16 however VBG was ordered.  Patient was also given D5 due to his gap and a mag was ordered due to his low potassium.  Patient imaging did come back reassuring at this time the biggest concern is possible alcohol withdrawal.  After receiving the Valium and rollator patient is still very tremorous and so patient will receive another round of Valium.  At this time patient would benefit from admission due to suspected alcohol withdrawal and so hospitalist will be consulted for possible stepdown placement.  I spoke to hospitalist and hospitalist stated they would evaluate the patient to see if patient is suitable for admission.  Nurse notified the attending and me that patient CIWA had increased to 27 and that he was having tremors again.  CIWA orders were placed and  hospitalist evaluated patient.  At this time it appears patient will be admitted.         Final Clinical Impression(s) / ED Diagnoses Final diagnoses:  Fall, initial encounter  Alcohol withdrawal syndrome with complication [facility])    Rx / DC Orders ED Discharge Orders     None         Remi Deter 12/01/22 1146    Gerhard Munch, MD 12/02/22 1244

## 2022-12-01 NOTE — H&P (Signed)
History and Physical    Patient: Derek Cruz ZOX:[id number] DOB: 05/04/1966 DOA: 12/01/2022 DOS: the patient was seen and examined on 12/01/2022 PCP: Rema Fendt, NP  Patient coming from: Home - lives with wife, children; NOK: Margot Chimes, [phone]   Chief Complaint: Falls  HPI: Brenon Pennell is a 57 y.o. male with medical history significant of ETOH dependence, HLD, and HTN presenting with falls in the setting of ETOH use.   He acknowledges drinking about 6 beers daily and said that he has been falling a lot.  He has various MSK pains associated with falls, including back pain.  He desires to stop drinking.  He has h/o possible withdrawal seizures but denies DTs.  He has been to rehab before (?[facility]).  CIWA at the time of my evaluation was 27.    ER Course:  Chronic ETOH use, fell yesterday.  Drinking all day.  Still intoxicated.  ETOH withdrawal.  Given Valium and IVF.       Review of Systems: As mentioned in the history of present illness. All other systems reviewed and are negative. Past Medical History:  Diagnosis Date   Alcohol abuse    Chest pain    Dyslipidemia    Elevated liver enzymes    Gastritis    Hypertension    No past surgical history on file. Social History:  reports that he has quit smoking. He has been exposed to tobacco smoke. He has never used smokeless tobacco. He reports current alcohol use. He reports that he does not use drugs.  No Known Allergies  Family History  Problem Relation Age of Onset   Diabetes Mellitus II Neg Hx    Stomach cancer Neg Hx    Colon cancer Neg Hx     Prior to Admission medications   Medication Sig Start Date End Date Taking? Authorizing Provider  atorvastatin (LIPITOR) 40 MG tablet Take 1 tablet (40 mg total) by mouth daily. 05/01/22 09/26/22  Rema Fendt, NP  insulin glargine (LANTUS) 100 unit/mL SOPN Inject 20 Units into the skin daily. 10/02/22   Augusto Gamble, MD  lisinopril  (ZESTRIL) 20 MG tablet Take 1 tablet (20 mg total) by mouth daily. 05/01/22 09/26/22  Rema Fendt, NP  metFORMIN (GLUCOPHAGE-XR) 750 MG 24 hr tablet Take 2 tablets (1,500 mg total) by mouth daily with breakfast. 10/02/22 11/01/22  Augusto Gamble, MD  traZODone (DESYREL) 50 MG tablet Take 1 tablet (50 mg total) by mouth at bedtime as needed for sleep. 10/01/22 10/31/22  Augusto Gamble, MD    Physical Exam: Vitals:   12/01/22 1521 12/01/22 1523 12/01/22 1701 12/01/22 1745  BP: 122/77  (!) 126/98 120/76  Pulse: 87  97 90  Resp:    17  Temp:  98.5 F (36.9 C)    TempSrc:  Oral    SpO2:    99%   General:  Appears anxious, mildly jittery Eyes:  EOMI, normal lids, iris ENT:  grossly normal hearing, lips & tongue, mmm Neck:  no LAD, masses or thyromegaly Cardiovascular:  RRR, no m/r/g. No LE edema.  Respiratory:   CTA bilaterally with no wheezes/rales/rhonchi.  Normal respiratory effort. Abdomen:  soft, NT, ND Skin:  no rash or induration seen on limited exam Musculoskeletal:  grossly normal tone BUE/BLE, good ROM, no bony abnormality Psychiatric:  anxious mood and affect, speech fluent and appropriate, AOx3 Neurologic:  CN 2-12 grossly intact, moves all extremities in coordinated fashion   Radiological Exams on Admission:  Independently reviewed - see discussion in A/P where applicable  CT CHEST ABDOMEN PELVIS W CONTRAST  Result Date: 12/01/2022 CLINICAL DATA:  57 year old male status post multiple falls in the past 2 days. Pain. EXAM: CT CHEST, ABDOMEN, AND PELVIS WITH CONTRAST TECHNIQUE: Multidetector CT imaging of the chest, abdomen and pelvis was performed following the standard protocol during bolus administration of intravenous contrast. RADIATION DOSE REDUCTION: This exam was performed according to the departmental dose-optimization program which includes automated exposure control, adjustment of the mA and/or kV according to patient size and/or use of iterative reconstruction technique.  CONTRAST:  75mL OMNIPAQUE IOHEXOL 350 MG/ML SOLN COMPARISON:  CT Abdomen and Pelvis 05/07/2020. Cervical spine CT today reported separately. FINDINGS: CT CHEST FINDINGS Cardiovascular: Intact thoracic aorta. No periaortic hematoma. Normal heart size. No pericardial effusion. Other central mediastinal vascular structures appear intact. Mediastinum/Nodes: Negative. No mediastinal hematoma, lymphadenopathy, mass. Lungs/Pleura: Major airways are patent. Largely normal lung volumes and clear lungs. There is minor dependent atelectasis. No pneumothorax, pleural effusion, or pulmonary contusion. Musculoskeletal: Bulky left acromial osteophytosis. But the visible shoulder osseous structures appear intact. No sternal fracture identified. No rib fracture identified. No thoracic vertebral fracture identified. CT ABDOMEN PELVIS FINDINGS Hepatobiliary: Chronic fatty liver disease. No liver or gallbladder injury, perihepatic fluid identified. Pancreas: Negative. Spleen: Diminutive and intact.  No perisplenic fluid. Adrenals/Urinary Tract: Negative; adrenal glands and kidneys appear normal. Distended but otherwise unremarkable urinary bladder. Occasional pelvic phleboliths. Stomach/Bowel: No dilated large or small bowel. Redundant sigmoid colon. Mild retained stool. Normal appendix series 4, image 92. Small volume retained fluid in the stomach. Duodenum appears negative. No free air or free fluid identified. Vascular/Lymphatic: Major arterial structures in the abdomen and pelvis appear patent and normal. No lymphadenopathy identified. Portal venous system appears to be patent. Reproductive: Negative. Other: No pelvis free fluid. Musculoskeletal: Chronic lumbar spine disc and endplate degeneration. Occasional vacuum disc. Mild chronic retrolisthesis of L1 on L2. Lower lumbar facet degeneration. Lumbar vertebrae, sacrum, SI joints, pelvis, and proximal femurs appear intact. Mild posterior left flank subcutaneous contusion series 4,  image 84. No other superficial soft tissue injury identified. IMPRESSION: 1. Mild posterior left flank subcutaneous contusion. 2. No other acute traumatic injury identified in the chest, abdomen, or pelvis. 3. Chronic fatty liver disease. Electronically Signed   By: Odessa Fleming M.D.   On: 12/01/2022 08:11   CT Cervical Spine Wo Contrast  Result Date: 12/01/2022 CLINICAL DATA:  57 year old male status post multiple falls in the past 2 days. Pain. EXAM: CT CERVICAL SPINE WITHOUT CONTRAST TECHNIQUE: Multidetector CT imaging of the cervical spine was performed without intravenous contrast. Multiplanar CT image reconstructions were also generated. RADIATION DOSE REDUCTION: This exam was performed according to the departmental dose-optimization program which includes automated exposure control, adjustment of the mA and/or kV according to patient size and/or use of iterative reconstruction technique. COMPARISON:  CT head and face today reported separately. FINDINGS: Alignment: Straightening of cervical lordosis. Cervicothoracic junction alignment is within normal limits. Bilateral posterior element alignment is within normal limits. Skull base and vertebrae: Bone mineralization is within normal limits. Visualized skull base is intact. No atlanto-occipital dissociation. C1 and C2 appear intact and aligned. Motion artifact most pronounced at C4 and C5. No acute osseous abnormality identified. Soft tissues and spinal canal: No prevertebral fluid or swelling. No visible canal hematoma. Larynx and pharynx motion artifact. Disc levels: Widespread cervical disc and endplate degeneration. 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 Upper chest:  Chest CT today reported separately. IMPRESSION: 1. Mildly motion degraded. No acute traumatic injury identified in the cervical spine. 2. Widespread cervical disc and endplate degeneration.  Early OPLL. Electronically Signed   By: Odessa Fleming M.D.    On: 12/01/2022 08:04   CT Maxillofacial WO CM  Result Date: 12/01/2022 CLINICAL DATA:  57 year old male status post multiple falls in the past 2 days. Pain. EXAM: CT MAXILLOFACIAL WITHOUT CONTRAST TECHNIQUE: Multidetector CT imaging of the maxillofacial structures was performed. Multiplanar CT image reconstructions were also generated. RADIATION DOSE REDUCTION: This exam was performed according to the departmental dose-optimization program which includes automated exposure control, adjustment of the mA and/or kV according to patient size and/or use of iterative reconstruction technique. COMPARISON:  CT head and cervical spine today reported separately. FINDINGS: Osseous: Mandible intact and normally located. No acute dental finding identified. Bilateral maxilla, zygoma, pterygoid, and nasal bones appear intact. Central skull base intact. Cervical spine detailed separately. Orbits: Chronic lamina papyracea fracture on the left. No acute orbital wall fracture. Globes and intraorbital soft tissues appear normal. Sinuses: Well aerated throughout. Tympanic cavities and mastoids appear clear. Soft tissues: Negative visible noncontrast deep soft tissue spaces of the face. No superficial soft tissue injury is identified. Limited intracranial: Reported separately today. IMPRESSION: 1.  No acute traumatic injury identified in the Face. 2. Chronic left lamina papyracea fracture. Electronically Signed   By: Odessa Fleming M.D.   On: 12/01/2022 08:01   DG Chest Port 1 View  Result Date: 12/01/2022 CLINICAL DATA:  57 year old male status post multiple falls in the past 2 days. Pain. EXAM: PORTABLE CHEST 1 VIEW COMPARISON:  Chest radiographs 09/23/2022 and earlier. FINDINGS: Portable AP upright view at 0720 hours. Lung volumes are within normal limits today. Normal cardiac size and mediastinal contours. Visualized tracheal air column is within normal limits. Allowing for portable technique the lungs are clear. No acute osseous  abnormality identified. Negative visible bowel gas. IMPRESSION: Negative portable chest. Electronically Signed   By: Odessa Fleming M.D.   On: 12/01/2022 07:51   CT Head Wo Contrast  Result Date: 12/01/2022 CLINICAL DATA:  57 year old male status post multiple falls in the past 2 days. Pain. EXAM: CT HEAD WITHOUT CONTRAST TECHNIQUE: Contiguous axial images were obtained from the base of the skull through the vertex without intravenous contrast. RADIATION DOSE REDUCTION: This exam was performed according to the departmental dose-optimization program which includes automated exposure control, adjustment of the mA and/or kV according to patient size and/or use of iterative reconstruction technique. COMPARISON:  Head CT and brain MRI 09/29/2022. FINDINGS: Brain: Partially empty sella redemonstrated. Background brain volume within normal limits for age. No midline shift, ventriculomegaly, mass effect, evidence of mass lesion, intracranial hemorrhage or evidence of cortically based acute infarction. Gray-white matter differentiation is within normal limits throughout the brain. Vascular: No suspicious intracranial vascular hyperdensity. Mild Calcified atherosclerosis at the skull base. Skull: Chronic left lamina papyracea fracture. No acute osseous abnormality identified. Sinuses/Orbits: Visualized paranasal sinuses and mastoids are stable and well aerated. Other: Broad-based posterior vertex and left convexity scalp hematoma (series 4, image 72). And there is trace associated scalp soft tissue gas. Underlying calvarium appears intact. Orbits soft tissues appears stable. IMPRESSION: 1. Posterior scalp hematoma, laceration with no underlying skull fracture. 2. Stable and negative for age noncontrast CT appearance of the brain. Electronically Signed   By: Odessa Fleming M.D.   On: 12/01/2022 07:50    EKG: Independently reviewed.  NSR with rate 95; no  evidence of acute ischemia   Labs on Admission: I have personally reviewed the  available labs and imaging studies at the time of the admission.  Pertinent labs:    VBG: 7.368/37.1/21.4 K+ 3.2 Glucose 147 Anion gap 16 AST 83/ALT 43 Unremarkable CBC ETOH 220 Unremarkable UA Lactate 4 -> 3.7  Assessment and Plan: Principal Problem:   Alcohol dependence with withdrawal, uncomplicated (HCC) Active Problems:   Uncontrolled type 2 diabetes mellitus with hyperglycemia, without long-term current use of insulin (HCC)   Hyperlipidemia   Essential hypertension   Frequent falls    ETOH Dependence with withdrawal -Patient with chronic ETOH dependence -Number of drinks per day: reportedly 6 beers (likely significantly more) -Number of UC/ER visits or admissions for management of alcohol withdrawal syndrome (detox): 6 since 09/23/2022 -History of withdrawal seizures, ICU admissions, DTs: reports seizures -Patient is exhibiting active s/sx of withdrawal, CIWA score is 27 -BAL on admission: 220 -He is at high risk for complications of withdrawal including seizures, DTs; he is at high risk for needing Precedex to help get through DTs -Will admit to progressive care at this time -CIWA protocol -Folate, thiamine, and MVI ordered -Will provide fixed-dose (Valium 5 mg PO q6h) and also symptom-triggered BZD (Ativan per CIWA protocol) given his high BAL level; h/o severe withdrawal; and/or current signs of severe withdrawal. -TOC team consult for possible inpatient treatment -Likely needs 50-month driving restriction after discharge -Will also check UDS. -Elevated LFTs are likely related to alcoholism -Consider offering a medication for Alcohol Use Disorder at the time of d/c, to include Disulfuram; Naltrexone; or Acamprosate.  Falls Likely associated with heavy ETOH use Imaging with contusions but no overt fractures Supportive care  HTN Hold lisinopril for now  HLD Hold atorvastatin for now  DM Recent A1c was 10.8 - poor control Hold Glucophage Cover with  moderate-scale SSI     Advance Care Planning:   Code Status: Full Code - Code status was discussed with the patient at the time of admission.  The patient would want to receive full resuscitative measures at this time.   Consults: Nutrition, TOC team  DVT Prophylaxis: Lovenox  Family Communication: None present; patient declined having me call his son at the time of admission  Severity of Illness: The appropriate patient status for this patient is INPATIENT. Inpatient status is judged to be reasonable and necessary in order to provide the required intensity of service to ensure the patient's safety. The patient's presenting symptoms, physical exam findings, and initial radiographic and laboratory data in the context of their chronic comorbidities is felt to place them at high risk for further clinical deterioration. Furthermore, it is not anticipated that the patient will be medically stable for discharge from the hospital within 2 midnights of admission.   * I certify that at the point of admission it is my clinical judgment that the patient will require inpatient hospital care spanning beyond 2 midnights from the point of admission due to high intensity of service, high risk for further deterioration and high frequency of surveillance required.*  Author: Jonah Blue, MD 12/01/2022 6:28 PM  For on call review [email or web address].

## 2022-12-01 NOTE — ED Notes (Signed)
Patient transported to CT scan . 

## 2022-12-01 NOTE — ED Notes (Signed)
ED TO INPATIENT HANDOFF REPORT  ED Nurse Name and Phone #: Grover Canavan [phone]  S Name/Age/Gender Tonette Bihari Tolentino 57 y.o. male Room/Bed: 009C/009C  Code Status   Code Status: Full Code  Home/SNF/Other Home Patient oriented to: self, place, time, and situation Is this baseline? Yes   Triage Complete: Triage complete  Chief Complaint Alcohol dependence with withdrawal, uncomplicated (HCC) [F10.230]  Triage Note Patient arrived with EMS from home fell twice 2 days ago , reports mild headache , sacral pain and anterior chest wall pain worse when moving/changing positions , + ETOH .    Allergies No Known Allergies  Level of Care/Admitting Diagnosis ED Disposition     ED Disposition  Admit   Condition  --   Comment  Hospital Area: [facility] [[id number]]  Level of Care: Progressive [102]  Admit to Progressive based on following criteria: ACUTE MENTAL DISORDER-RELATED Drug/Alcohol Ingestion/Overdose/Withdrawal, Suicidal Ideation/attempt requiring safety sitter and < Q2h monitoring/assessments, moderate to severe agitation that is managed with medication/sitter, CIWA-Ar score < 20.  May admit patient to [facility] or [facility] if equivalent level of care is available:: Yes  Covid Evaluation: Asymptomatic - no recent exposure (last 10 days) testing not required  Diagnosis: Alcohol dependence with withdrawal, uncomplicated [facility]) [[id number]]  Admitting Physician: Jonah Blue [[id number]]  Attending Physician: Jonah Blue [[id number]]  Certification:: I certify this patient will need inpatient services for at least 2 midnights  Estimated Length of Stay: 4          B Medical/Surgery History Past Medical History:  Diagnosis Date   Alcohol abuse    Chest pain    Dyslipidemia    Elevated liver enzymes    Gastritis    Hypertension    No past surgical history on file.   A IV Location/Drains/Wounds Patient Lines/Drains/Airways Status     Active  Line/Drains/Airways     Name Placement date Placement time Site Days   Peripheral IV 12/01/22 18 G Left Antecubital 12/01/22  --  Antecubital  less than 1            Intake/Output Last 24 hours No intake or output data in the 24 hours ending 12/01/22 1904  Labs/Imaging Results for orders placed or performed during the hospital encounter of 12/01/22 (from the past 48 hour(s))  Comprehensive metabolic panel     Status: Abnormal   Collection Time: 12/01/22  7:00 AM  Result Value Ref Range   Sodium 134 (L) 135 - 145 mmol/L   Potassium 3.2 (L) 3.5 - 5.1 mmol/L   Chloride 96 (L) 98 - 111 mmol/L   CO2 22 22 - 32 mmol/L   Glucose, Bld 147 (H) 70 - 99 mg/dL    Comment: Glucose reference range applies only to samples taken after fasting for at least 8 hours.   BUN 6 6 - 20 mg/dL   Creatinine, Ser 0.98 0.61 - 1.24 mg/dL   Calcium 8.2 (L) 8.9 - 10.3 mg/dL   Total Protein 7.3 6.5 - 8.1 g/dL   Albumin 3.9 3.5 - 5.0 g/dL   AST 83 (H) 15 - 41 U/L   ALT 43 0 - 44 U/L   Alkaline Phosphatase 99 38 - 126 U/L   Total Bilirubin 0.9 0.3 - 1.2 mg/dL   GFR, Estimated >11 >91 mL/min    Comment: (NOTE) Calculated using the CKD-EPI Creatinine Equation (2021)    Anion gap 16 (H) 5 - 15    Comment: Performed at [facility],  [address], [place], [place] [address]  Troponin I (High Sensitivity)     Status: None   Collection Time: 12/01/22  7:00 AM  Result Value Ref Range   Troponin I (High Sensitivity) 8 <18 ng/L    Comment: (NOTE) Elevated high sensitivity troponin I (hsTnI) values and significant  changes across serial measurements may suggest ACS but many other  chronic and acute conditions are known to elevate hsTnI results.  Refer to the "Links" section for chest pain algorithms and additional  guidance. Performed at [facility], [address], [place], [place] [address]   CBC with Differential     Status: Abnormal   Collection Time: 12/01/22  7:00 AM  Result Value Ref  Range   WBC 7.9 4.0 - 10.5 K/uL   RBC 4.60 4.22 - 5.81 MIL/uL   Hemoglobin 14.6 13.0 - 17.0 g/dL   HCT 09.8 11.9 - 14.7 %   MCV 87.8 80.0 - 100.0 fL   MCH 31.7 26.0 - 34.0 pg   MCHC 36.1 (H) 30.0 - 36.0 g/dL   RDW 82.9 56.2 - 13.0 %   Platelets 212 150 - 400 K/uL   nRBC 0.0 0.0 - 0.2 %   Neutrophils Relative % 80 %   Neutro Abs 6.3 1.7 - 7.7 K/uL   Lymphocytes Relative 14 %   Lymphs Abs 1.1 0.7 - 4.0 K/uL   Monocytes Relative 5 %   Monocytes Absolute 0.4 0.1 - 1.0 K/uL   Eosinophils Relative 0 %   Eosinophils Absolute 0.0 0.0 - 0.5 K/uL   Basophils Relative 1 %   Basophils Absolute 0.1 0.0 - 0.1 K/uL   Immature Granulocytes 0 %   Abs Immature Granulocytes 0.02 0.00 - 0.07 K/uL    Comment: Performed at [facility], [address], [place], [place] [address]  Ethanol     Status: Abnormal   Collection Time: 12/01/22  7:00 AM  Result Value Ref Range   Alcohol, Ethyl (B) 220 (H) <10 mg/dL    Comment: (NOTE) Lowest detectable limit for serum alcohol is 10 mg/dL.  For medical purposes only. Performed at [facility], [address] [place], [place] [address]   Magnesium     Status: None   Collection Time: 12/01/22  7:00 AM  Result Value Ref Range   Magnesium 2.2 1.7 - 2.4 mg/dL    Comment: Performed at [facility], [address] [place], [place] [address]  I-stat chem 8, ED (not at [facility], [facility] or [facility])     Status: Abnormal   Collection Time: 12/01/22  7:05 AM  Result Value Ref Range   Sodium 135 135 - 145 mmol/L   Potassium 3.3 (L) 3.5 - 5.1 mmol/L   Chloride 98 98 - 111 mmol/L   BUN 5 (L) 6 - 20 mg/dL   Creatinine, Ser 1.32 0.61 - 1.24 mg/dL   Glucose, Bld 440 (H) 70 - 99 mg/dL    Comment: Glucose reference range applies only to samples taken after fasting for at least 8 hours.   Calcium, Ion 1.01 (L) 1.15 - 1.40 mmol/L   TCO2 22 22 - 32 mmol/L   Hemoglobin 15.0 13.0 - 17.0 g/dL   HCT 10.2 72.5 - 36.6 %  POC CBG, ED     Status: Abnormal    Collection Time: 12/01/22  7:34 AM  Result Value Ref Range   Glucose-Capillary 122 (H) 70 - 99 mg/dL    Comment:  Glucose reference range applies only to samples taken after fasting for at least 8 hours.   Comment 1 Notify RN    Comment 2 Document in Chart   Lipase, blood     Status: None   Collection Time: 12/01/22  8:24 AM  Result Value Ref Range   Lipase 32 11 - 51 U/L    Comment: Performed at [facility], [address], [place], [place] [address]  Troponin I (High Sensitivity)     Status: None   Collection Time: 12/01/22  8:24 AM  Result Value Ref Range   Troponin I (High Sensitivity) 8 <18 ng/L    Comment: (NOTE) Elevated high sensitivity troponin I (hsTnI) values and significant  changes across serial measurements may suggest ACS but many other  chronic and acute conditions are known to elevate hsTnI results.  Refer to the "Links" section for chest pain algorithms and additional  guidance. Performed at [facility], [address], [place], [place] [address]   Urinalysis, Routine w reflex microscopic -Urine, Clean Catch     Status: Abnormal   Collection Time: 12/01/22  8:35 AM  Result Value Ref Range   Color, Urine STRAW (A) YELLOW   APPearance CLEAR CLEAR   Specific Gravity, Urine 1.028 1.005 - 1.030   pH 6.0 5.0 - 8.0   Glucose, UA NEGATIVE NEGATIVE mg/dL   Hgb urine dipstick NEGATIVE NEGATIVE   Bilirubin Urine NEGATIVE NEGATIVE   Ketones, ur NEGATIVE NEGATIVE mg/dL   Protein, ur NEGATIVE NEGATIVE mg/dL   Nitrite NEGATIVE NEGATIVE   Leukocytes,Ua NEGATIVE NEGATIVE   RBC / HPF 0-5 0 - 5 RBC/hpf   WBC, UA 0-5 0 - 5 WBC/hpf   Bacteria, UA NONE SEEN NONE SEEN   Squamous Epithelial / HPF 0-5 0 - 5 /HPF    Comment: Performed at [facility], [address] [place], [place] [address]  I-Stat venous blood gas, ED     Status: Abnormal   Collection Time: 12/01/22  8:58 AM  Result Value Ref Range   pH, Ven 7.368 7.25 - 7.43   pCO2, Ven 37.1 (L) 44 - 60  mmHg   pO2, Ven 164 (H) 32 - 45 mmHg   Bicarbonate 21.4 20.0 - 28.0 mmol/L   TCO2 22 22 - 32 mmol/L   O2 Saturation 99 %   Acid-base deficit 4.0 (H) 0.0 - 2.0 mmol/L   Sodium 137 135 - 145 mmol/L   Potassium 3.4 (L) 3.5 - 5.1 mmol/L   Calcium, Ion 0.95 (L) 1.15 - 1.40 mmol/L   HCT 37.0 (L) 39.0 - 52.0 %   Hemoglobin 12.6 (L) 13.0 - 17.0 g/dL   Sample type VENOUS   Lactic acid, plasma     Status: Abnormal   Collection Time: 12/01/22  9:12 AM  Result Value Ref Range   Lactic Acid, Venous 4.0 (HH) 0.5 - 1.9 mmol/L    Comment: CRITICAL RESULT CALLED TO, READ BACK BY AND VERIFIED WITH C COBB RN AT [phone] BY D LONG Performed at [facility], [address] [place], [place] [address]   CBG monitoring, ED     Status: Abnormal   Collection Time: 12/01/22 11:51 AM  Result Value Ref Range   Glucose-Capillary 109 (H) 70 - 99 mg/dL    Comment: Glucose reference range applies only to samples taken after fasting for at least 8 hours.  HIV Antibody (routine testing w rflx)     Status: None  Collection Time: 12/01/22 11:53 AM  Result Value Ref Range   HIV Screen 4th Generation wRfx Non Reactive Non Reactive    Comment: Performed at [facility], [address] [place], [place] [address]  Lactic acid, plasma     Status: Abnormal   Collection Time: 12/01/22 11:53 AM  Result Value Ref Range   Lactic Acid, Venous 3.7 (HH) 0.5 - 1.9 mmol/L    Comment: CRITICAL VALUE NOTED. VALUE IS CONSISTENT WITH PREVIOUSLY REPORTED/CALLED VALUE Performed at [facility], [address], [place], [place] [address]   CBG monitoring, ED     Status: Abnormal   Collection Time: 12/01/22  4:56 PM  Result Value Ref Range   Glucose-Capillary 140 (H) 70 - 99 mg/dL    Comment: Glucose reference range applies only to samples taken after fasting for at least 8 hours.   CT CHEST ABDOMEN PELVIS W CONTRAST  Result Date: 12/01/2022 CLINICAL DATA:  57 year old male status post multiple falls in the  past 2 days. Pain. EXAM: CT CHEST, ABDOMEN, AND PELVIS WITH CONTRAST TECHNIQUE: Multidetector CT imaging of the chest, abdomen and pelvis was performed following the standard protocol during bolus administration of intravenous contrast. RADIATION DOSE REDUCTION: This exam was performed according to the departmental dose-optimization program which includes automated exposure control, adjustment of the mA and/or kV according to patient size and/or use of iterative reconstruction technique. CONTRAST:  75mL OMNIPAQUE IOHEXOL 350 MG/ML SOLN COMPARISON:  CT Abdomen and Pelvis 05/07/2020. Cervical spine CT today reported separately. FINDINGS: CT CHEST FINDINGS Cardiovascular: Intact thoracic aorta. No periaortic hematoma. Normal heart size. No pericardial effusion. Other central mediastinal vascular structures appear intact. Mediastinum/Nodes: Negative. No mediastinal hematoma, lymphadenopathy, mass. Lungs/Pleura: Major airways are patent. Largely normal lung volumes and clear lungs. There is minor dependent atelectasis. No pneumothorax, pleural effusion, or pulmonary contusion. Musculoskeletal: Bulky left acromial osteophytosis. But the visible shoulder osseous structures appear intact. No sternal fracture identified. No rib fracture identified. No thoracic vertebral fracture identified. CT ABDOMEN PELVIS FINDINGS Hepatobiliary: Chronic fatty liver disease. No liver or gallbladder injury, perihepatic fluid identified. Pancreas: Negative. Spleen: Diminutive and intact.  No perisplenic fluid. Adrenals/Urinary Tract: Negative; adrenal glands and kidneys appear normal. Distended but otherwise unremarkable urinary bladder. Occasional pelvic phleboliths. Stomach/Bowel: No dilated large or small bowel. Redundant sigmoid colon. Mild retained stool. Normal appendix series 4, image 92. Small volume retained fluid in the stomach. Duodenum appears negative. No free air or free fluid identified. Vascular/Lymphatic: Major arterial  structures in the abdomen and pelvis appear patent and normal. No lymphadenopathy identified. Portal venous system appears to be patent. Reproductive: Negative. Other: No pelvis free fluid. Musculoskeletal: Chronic lumbar spine disc and endplate degeneration. Occasional vacuum disc. Mild chronic retrolisthesis of L1 on L2. Lower lumbar facet degeneration. Lumbar vertebrae, sacrum, SI joints, pelvis, and proximal femurs appear intact. Mild posterior left flank subcutaneous contusion series 4, image 84. No other superficial soft tissue injury identified. IMPRESSION: 1. Mild posterior left flank subcutaneous contusion. 2. No other acute traumatic injury identified in the chest, abdomen, or pelvis. 3. Chronic fatty liver disease. Electronically Signed   By: Odessa Fleming M.D.   On: 12/01/2022 08:11   CT Cervical Spine Wo Contrast  Result Date: 12/01/2022 CLINICAL DATA:  57 year old male status post multiple falls in the past 2 days. Pain. EXAM: CT CERVICAL SPINE WITHOUT CONTRAST TECHNIQUE: Multidetector CT imaging of the cervical spine was performed without intravenous contrast. Multiplanar CT image reconstructions were also generated. RADIATION DOSE  REDUCTION: This exam was performed according to the departmental dose-optimization program which includes automated exposure control, adjustment of the mA and/or kV according to patient size and/or use of iterative reconstruction technique. COMPARISON:  CT head and face today reported separately. FINDINGS: Alignment: Straightening of cervical lordosis. Cervicothoracic junction alignment is within normal limits. Bilateral posterior element alignment is within normal limits. Skull base and vertebrae: Bone mineralization is within normal limits. Visualized skull base is intact. No atlanto-occipital dissociation. C1 and C2 appear intact and aligned. Motion artifact most pronounced at C4 and C5. No acute osseous abnormality identified. Soft tissues and spinal canal: No  prevertebral fluid or swelling. No visible canal hematoma. Larynx and pharynx motion artifact. Disc levels: Widespread cervical disc and endplate degeneration. 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 Upper chest: Chest CT today reported separately. IMPRESSION: 1. Mildly motion degraded. No acute traumatic injury identified in the cervical spine. 2. Widespread cervical disc and endplate degeneration.  Early OPLL. Electronically Signed   By: Odessa Fleming M.D.   On: 12/01/2022 08:04   CT Maxillofacial WO CM  Result Date: 12/01/2022 CLINICAL DATA:  57 year old male status post multiple falls in the past 2 days. Pain. EXAM: CT MAXILLOFACIAL WITHOUT CONTRAST TECHNIQUE: Multidetector CT imaging of the maxillofacial structures was performed. Multiplanar CT image reconstructions were also generated. RADIATION DOSE REDUCTION: This exam was performed according to the departmental dose-optimization program which includes automated exposure control, adjustment of the mA and/or kV according to patient size and/or use of iterative reconstruction technique. COMPARISON:  CT head and cervical spine today reported separately. FINDINGS: Osseous: Mandible intact and normally located. No acute dental finding identified. Bilateral maxilla, zygoma, pterygoid, and nasal bones appear intact. Central skull base intact. Cervical spine detailed separately. Orbits: Chronic lamina papyracea fracture on the left. No acute orbital wall fracture. Globes and intraorbital soft tissues appear normal. Sinuses: Well aerated throughout. Tympanic cavities and mastoids appear clear. Soft tissues: Negative visible noncontrast deep soft tissue spaces of the face. No superficial soft tissue injury is identified. Limited intracranial: Reported separately today. IMPRESSION: 1.  No acute traumatic injury identified in the Face. 2. Chronic left lamina papyracea fracture. Electronically Signed   By: Odessa Fleming M.D.    On: 12/01/2022 08:01   DG Chest Port 1 View  Result Date: 12/01/2022 CLINICAL DATA:  57 year old male status post multiple falls in the past 2 days. Pain. EXAM: PORTABLE CHEST 1 VIEW COMPARISON:  Chest radiographs 09/23/2022 and earlier. FINDINGS: Portable AP upright view at 0720 hours. Lung volumes are within normal limits today. Normal cardiac size and mediastinal contours. Visualized tracheal air column is within normal limits. Allowing for portable technique the lungs are clear. No acute osseous abnormality identified. Negative visible bowel gas. IMPRESSION: Negative portable chest. Electronically Signed   By: Odessa Fleming M.D.   On: 12/01/2022 07:51   CT Head Wo Contrast  Result Date: 12/01/2022 CLINICAL DATA:  57 year old male status post multiple falls in the past 2 days. Pain. EXAM: CT HEAD WITHOUT CONTRAST TECHNIQUE: Contiguous axial images were obtained from the base of the skull through the vertex without intravenous contrast. RADIATION DOSE REDUCTION: This exam was performed according to the departmental dose-optimization program which includes automated exposure control, adjustment of the mA and/or kV according to patient size and/or use of iterative reconstruction technique. COMPARISON:  Head CT and brain MRI 09/29/2022. FINDINGS: Brain: Partially empty sella redemonstrated. Background brain volume within normal limits for age. No midline  shift, ventriculomegaly, mass effect, evidence of mass lesion, intracranial hemorrhage or evidence of cortically based acute infarction. Gray-white matter differentiation is within normal limits throughout the brain. Vascular: No suspicious intracranial vascular hyperdensity. Mild Calcified atherosclerosis at the skull base. Skull: Chronic left lamina papyracea fracture. No acute osseous abnormality identified. Sinuses/Orbits: Visualized paranasal sinuses and mastoids are stable and well aerated. Other: Broad-based posterior vertex and left convexity scalp hematoma  (series 4, image 72). And there is trace associated scalp soft tissue gas. Underlying calvarium appears intact. Orbits soft tissues appears stable. IMPRESSION: 1. Posterior scalp hematoma, laceration with no underlying skull fracture. 2. Stable and negative for age noncontrast CT appearance of the brain. Electronically Signed   By: Odessa Fleming M.D.   On: 12/01/2022 07:50    Pending Labs Unresulted Labs (From admission, onward)     Start     Ordered   12/02/22 0500  Comprehensive metabolic panel  Tomorrow morning,   R        12/01/22 1108   12/02/22 0500  CBC  Tomorrow morning,   R        12/01/22 1108   12/02/22 0500  Protime-INR  Tomorrow morning,   R        12/01/22 1108   12/01/22 1824  Lactic acid, plasma  STAT Now then every 3 hours,   R (with STAT occurrences)      12/01/22 1823   12/01/22 0818  Rapid urine drug screen (hospital performed)  ONCE - STAT,   STAT        12/01/22 0817            Vitals/Pain Today's Vitals   12/01/22 1521 12/01/22 1523 12/01/22 1701 12/01/22 1745  BP: 122/77  (!) 126/98 120/76  Pulse: 87  97 90  Resp:    17  Temp:  98.5 F (36.9 C)    TempSrc:  Oral    SpO2:    99%  PainSc:        Isolation Precautions No active isolations  Medications Medications  folic acid injection 1 mg (1 mg Intravenous Given 12/01/22 1248)  LORazepam (ATIVAN) tablet 1-4 mg ( Oral See Alternative 12/01/22 1528)    Or  LORazepam (ATIVAN) injection 1-4 mg ( Intravenous Not Given 12/01/22 1528)  thiamine (VITAMIN B1) tablet 100 mg (has no administration in time range)    Or  thiamine (VITAMIN B1) injection 100 mg (has no administration in time range)  multivitamin with minerals tablet 1 tablet (1 tablet Oral Given 12/01/22 1256)  insulin aspart (novoLOG) injection 0-15 Units (2 Units Subcutaneous Given 12/01/22 1700)  insulin aspart (novoLOG) injection 0-5 Units (has no administration in time range)  enoxaparin (LOVENOX) injection 40 mg (has no administration in time  range)  sodium chloride flush (NS) 0.9 % injection 3 mL (3 mLs Intravenous Given 12/01/22 1248)  lactated ringers infusion ( Intravenous New Bag/Given 12/01/22 1654)  acetaminophen (TYLENOL) tablet 650 mg (has no administration in time range)    Or  acetaminophen (TYLENOL) suppository 650 mg (has no administration in time range)  docusate sodium (COLACE) capsule 100 mg (has no administration in time range)  polyethylene glycol (MIRALAX / GLYCOLAX) packet 17 g (has no administration in time range)  bisacodyl (DULCOLAX) EC tablet 5 mg (has no administration in time range)  ondansetron (ZOFRAN) tablet 4 mg (has no administration in time range)    Or  ondansetron (ZOFRAN) injection 4 mg (has no administration in time range)  hydrALAZINE (APRESOLINE)  injection 5 mg (has no administration in time range)  LORazepam (ATIVAN) injection 0-4 mg (1 mg Intravenous Given 12/01/22 1530)    Followed by  LORazepam (ATIVAN) injection 0-4 mg (has no administration in time range)  diazepam (VALIUM) tablet 5 mg (5 mg Oral Given 12/01/22 1659)  sodium chloride 0.9 % bolus 1,000 mL (0 mLs Intravenous Stopped 12/01/22 0838)  ondansetron (ZOFRAN) injection 4 mg (4 mg Intravenous Given 12/01/22 0702)  thiamine (VITAMIN B1) injection 100 mg (100 mg Intravenous Given 12/01/22 0702)  diazepam (VALIUM) injection 5 mg (5 mg Intravenous Given 12/01/22 0704)  iohexol (OMNIPAQUE) 350 MG/ML injection 75 mL (75 mLs Intravenous Contrast Given 12/01/22 0727)  diazepam (VALIUM) injection 5 mg (5 mg Intravenous Given 12/01/22 0908)  ondansetron (ZOFRAN) injection 4 mg (4 mg Intravenous Given 12/01/22 1101)  lactated ringers bolus 1,000 mL (0 mLs Intravenous Stopped 12/01/22 1654)    Mobility walks with person assist     Focused Assessments Neuro Assessment Handoff:  Swallow screen pass?  N/a         Neuro Assessment: Within Defined Limits Neuro Checks:      Has TPA been given? No If patient is a Neuro Trauma and patient is  going to OR before floor call report to 4N Charge nurse: [phone] or [phone]   R Recommendations: See Admitting Provider Note  Report given to:   Additional Notes:   CIWA

## 2022-12-01 NOTE — ED Notes (Signed)
Notified EDP about pt vomiting and needing CIWA orders placed

## 2022-12-02 ENCOUNTER — Inpatient Hospital Stay (HOSPITAL_COMMUNITY): Payer: Self-pay

## 2022-12-02 LAB — COMPREHENSIVE METABOLIC PANEL
ALT: 32 U/L (ref 0–44)
AST: 49 U/L — ABNORMAL HIGH (ref 15–41)
Albumin: 3.1 g/dL — ABNORMAL LOW (ref 3.5–5.0)
Alkaline Phosphatase: 88 U/L (ref 38–126)
Anion gap: 8 (ref 5–15)
BUN: 7 mg/dL (ref 6–20)
CO2: 24 mmol/L (ref 22–32)
Calcium: 8.3 mg/dL — ABNORMAL LOW (ref 8.9–10.3)
Chloride: 101 mmol/L (ref 98–111)
Creatinine, Ser: 0.62 mg/dL (ref 0.61–1.24)
GFR, Estimated: >60 mL/min (ref >60)
Glucose, Bld: 104 mg/dL — ABNORMAL HIGH (ref 70–99)
Potassium: 3.1 mmol/L — ABNORMAL LOW (ref 3.5–5.1)
Sodium: 133 mmol/L — ABNORMAL LOW (ref 135–145)
Total Bilirubin: 1.2 mg/dL (ref 0.3–1.2)
Total Protein: 6 g/dL — ABNORMAL LOW (ref 6.5–8.1)

## 2022-12-02 LAB — CBC
HCT: 36.7 % — ABNORMAL LOW (ref 39.0–52.0)
Hemoglobin: 13.2 g/dL (ref 13.0–17.0)
MCH: 32.3 pg (ref 26.0–34.0)
MCHC: 36 g/dL (ref 30.0–36.0)
MCV: 89.7 fL (ref 80.0–100.0)
Platelets: 159 10*3/uL (ref 150–400)
RBC: 4.09 MIL/uL — ABNORMAL LOW (ref 4.22–5.81)
RDW: 12.9 % (ref 11.5–15.5)
WBC: 5.7 10*3/uL (ref 4.0–10.5)
nRBC: 0 % (ref 0.0–0.2)

## 2022-12-02 LAB — GLUCOSE, CAPILLARY
Glucose-Capillary: 123 mg/dL — ABNORMAL HIGH (ref 70–99)
Glucose-Capillary: 134 mg/dL — ABNORMAL HIGH (ref 70–99)
Glucose-Capillary: 163 mg/dL — ABNORMAL HIGH (ref 70–99)
Glucose-Capillary: 132 mg/dL — ABNORMAL HIGH (ref 70–99)

## 2022-12-02 LAB — LACTIC ACID, PLASMA
Lactic Acid, Venous: 0.9 mmol/L (ref 0.5–1.9)
Lactic Acid, Venous: 2.3 mmol/L (ref 0.5–1.9)
Lactic Acid, Venous: 2.9 mmol/L (ref 0.5–1.9)
Lactic Acid, Venous: 1 mmol/L (ref 0.5–1.9)

## 2022-12-02 LAB — PROTIME-INR
INR: 1.1 (ref 0.8–1.2)
Prothrombin Time: 14.3 seconds (ref 11.4–15.2)

## 2022-12-02 MED ORDER — GLUCERNA SHAKE PO LIQD
237.0000 mL | Freq: Three times a day (TID) | ORAL | Status: DC
  Administered 2022-12-02 – 2022-12-06 (×11): 237 mL via ORAL

## 2022-12-02 MED ORDER — THIAMINE HCL 100 MG/ML IJ SOLN
500.0000 mg | Freq: Once | INTRAVENOUS | Status: AC
  Administered 2022-12-02: 500 mg via INTRAVENOUS
  Filled 2022-12-02: qty 5

## 2022-12-02 MED ORDER — LACTATED RINGERS IV BOLUS
500.0000 mL | Freq: Once | INTRAVENOUS | Status: AC
  Administered 2022-12-02: 500 mL via INTRAVENOUS

## 2022-12-02 MED ORDER — SODIUM CHLORIDE 0.9 % IV BOLUS
1000.0000 mL | INTRAVENOUS | Status: AC
  Administered 2022-12-02 (×2): 1000 mL via INTRAVENOUS

## 2022-12-02 MED ORDER — MORPHINE SULFATE (PF) 2 MG/ML IV SOLN
1.0000 mg | INTRAVENOUS | Status: AC | PRN
  Administered 2022-12-02 (×2): 2 mg via INTRAVENOUS
  Filled 2022-12-02 (×2): qty 1

## 2022-12-02 MED ORDER — POTASSIUM CHLORIDE CRYS ER 20 MEQ PO TBCR
40.0000 meq | EXTENDED_RELEASE_TABLET | Freq: Two times a day (BID) | ORAL | Status: AC
  Administered 2022-12-02 (×2): 40 meq via ORAL
  Filled 2022-12-02 (×2): qty 2

## 2022-12-02 NOTE — Progress Notes (Signed)
Spoke with assigned RN who states that patient's current IV access is working without any issues. States no need for additional IV access at this time.

## 2022-12-02 NOTE — Plan of Care (Signed)

## 2022-12-02 NOTE — Progress Notes (Signed)
PROGRESS NOTE    Derek Cruz  ZOX:[id number] DOB: 1966-04-21 DOA: 12/01/2022 PCP: Rema Fendt, NP    Chief Complaint  Patient presents with   Fall    No Thinners + ETOH    Brief Narrative:    Derek Cruz is a 57 y.o. male with medical history significant of ETOH dependence, HLD, and HTN presenting with falls in the setting of ETOH use.   He acknowledges drinking about 6 beers daily and said that he has been falling a lot.  He has various MSK pains associated with falls, including back pain.  He desires to stop drinking.  He has h/o possible withdrawal seizures but denies DTs.  He has been to rehab before (?[facility]).  CIWA at the time of my evaluation was 27.patient with known chronic ETOH use, fell yesterday.  Drinking all day.  Still intoxicated on admission, and ETOH withdrawal.  Given Valium and IVF.  And he was admitted for further workup.   Assessment & Plan:   Principal Problem:   Alcohol dependence with withdrawal, uncomplicated (HCC) Active Problems:   Uncontrolled type 2 diabetes mellitus with hyperglycemia, without long-term current use of insulin (HCC)   Hyperlipidemia   Essential hypertension   Frequent falls      ETOH Dependence with withdrawal -Patient with chronic ETOH dependence -Number of drinks per day: reportedly 6 beers (likely significantly more) -Number of UC/ER visits or admissions for management of alcohol withdrawal syndrome (detox): 6 since 09/23/2022 -History of withdrawal seizures, ICU admissions, DTs: reports seizures -Patient is exhibiting active s/sx of withdrawal, CIWA score is 27 -BAL on admission: 220 -He is at high risk for complications of withdrawal including seizures, DTs; he is at high risk for needing Precedex to help get through DTs -Scheduled Valium 5 mg every 6 hours -Continue with CIWA protocol -Folate, thiamine, and MVI ordered -Will provide fixed-dose (Valium 5 mg PO q6h) and also  symptom-triggered BZD (Ativan per CIWA protocol) given his high BAL level; h/o severe withdrawal; and/or current signs of severe withdrawal. -TOC team consult for possible inpatient treatment -Likely needs 83-month driving restriction after discharge -Will also check UDS. -Elevated LFTs are likely related to alcoholism  Falls Likely associated with heavy ETOH use Imaging with contusions but no overt fractures Supportive care   HTN Hold lisinopril for now   HLD Hold atorvastatin for now   DM Recent A1c was 10.8 - poor control Hold Glucophage Cover with moderate-scale SSI    lactic acidosis -Due to above, continue to hold Glucophage, continue with IV fluids, monitor     DVT prophylaxis: Lovenox Code Status: Full Family Communication: none at bedside Disposition:   Status is: Inpatient    Consultants:  None   Subjective:  Patient reports she is feeling poorly this morning, ports complains of left sided abdominal pain to staff later in the day.  Objective: Vitals:   12/02/22 0000 12/02/22 0040 12/02/22 0403 12/02/22 0845  BP: 113/62 121/83 (!) 119/99 (!) 147/90  Pulse: 69 75 72 81  Resp: 15 20 19    Temp:  98.5 F (36.9 C) 98.2 F (36.8 C)   TempSrc:  Oral Oral   SpO2: 96% 96% 97%     Intake/Output Summary (Last 24 hours) at 12/02/2022 1224 Last data filed at 12/02/2022 0800 Gross per 24 hour  Intake 703.9 ml  Output 900 ml  Net -196.1 ml   There were no vitals filed for this visit.  Examination:  Awake Alert, with tremors,  ill-appearing Symmetrical Chest wall movement, Good air movement bilaterally, CTAB RRR,No Gallops,Rubs or new Murmurs, No Parasternal Heave +ve B.Sounds, Abd Soft, No tenderness, No rebound - guarding or rigidity. No Cyanosis, Clubbing or edema, No new Rash or bruise      Data Reviewed: I have personally reviewed following labs and imaging studies  CBC: Recent Labs  Lab 12/01/22 0700 12/01/22 0705 12/01/22 0858  12/02/22 0312  WBC 7.9  --   --  5.7  NEUTROABS 6.3  --   --   --   HGB 14.6 15.0 12.6* 13.2  HCT 40.4 44.0 37.0* 36.7*  MCV 87.8  --   --  89.7  PLT 212  --   --  159    Basic Metabolic Panel: Recent Labs  Lab 12/01/22 0700 12/01/22 0705 12/01/22 0858 12/02/22 0312  NA 134* 135 137 133*  K 3.2* 3.3* 3.4* 3.1*  CL 96* 98  --  101  CO2 22  --   --  24  GLUCOSE 147* 145*  --  104*  BUN 6 5*  --  7  CREATININE 0.69 0.90  --  0.62  CALCIUM 8.2*  --   --  8.3*  MG 2.2  --   --   --     GFR: CrCl cannot be calculated (Unknown ideal weight.).  Liver Function Tests: Recent Labs  Lab 12/01/22 0700 12/02/22 0312  AST 83* 49*  ALT 43 32  ALKPHOS 99 88  BILITOT 0.9 1.2  PROT 7.3 6.0*  ALBUMIN 3.9 3.1*    CBG: Recent Labs  Lab 12/01/22 1151 12/01/22 1656 12/01/22 2136 12/02/22 0920 12/02/22 1157  GLUCAP 109* 140* 110* 123* 134*     No results found for this or any previous visit (from the past 240 hour(s)).       Radiology Studies: CT CHEST ABDOMEN PELVIS W CONTRAST  Result Date: 12/01/2022 CLINICAL DATA:  57 year old male status post multiple falls in the past 2 days. Pain. EXAM: CT CHEST, ABDOMEN, AND PELVIS WITH CONTRAST TECHNIQUE: Multidetector CT imaging of the chest, abdomen and pelvis was performed following the standard protocol during bolus administration of intravenous contrast. RADIATION DOSE REDUCTION: This exam was performed according to the departmental dose-optimization program which includes automated exposure control, adjustment of the mA and/or kV according to patient size and/or use of iterative reconstruction technique. CONTRAST:  75mL OMNIPAQUE IOHEXOL 350 MG/ML SOLN COMPARISON:  CT Abdomen and Pelvis 05/07/2020. Cervical spine CT today reported separately. FINDINGS: CT CHEST FINDINGS Cardiovascular: Intact thoracic aorta. No periaortic hematoma. Normal heart size. No pericardial effusion. Other central mediastinal vascular structures appear  intact. Mediastinum/Nodes: Negative. No mediastinal hematoma, lymphadenopathy, mass. Lungs/Pleura: Major airways are patent. Largely normal lung volumes and clear lungs. There is minor dependent atelectasis. No pneumothorax, pleural effusion, or pulmonary contusion. Musculoskeletal: Bulky left acromial osteophytosis. But the visible shoulder osseous structures appear intact. No sternal fracture identified. No rib fracture identified. No thoracic vertebral fracture identified. CT ABDOMEN PELVIS FINDINGS Hepatobiliary: Chronic fatty liver disease. No liver or gallbladder injury, perihepatic fluid identified. Pancreas: Negative. Spleen: Diminutive and intact.  No perisplenic fluid. Adrenals/Urinary Tract: Negative; adrenal glands and kidneys appear normal. Distended but otherwise unremarkable urinary bladder. Occasional pelvic phleboliths. Stomach/Bowel: No dilated large or small bowel. Redundant sigmoid colon. Mild retained stool. Normal appendix series 4, image 92. Small volume retained fluid in the stomach. Duodenum appears negative. No free air or free fluid identified. Vascular/Lymphatic: Major arterial structures in the abdomen and pelvis appear  patent and normal. No lymphadenopathy identified. Portal venous system appears to be patent. Reproductive: Negative. Other: No pelvis free fluid. Musculoskeletal: Chronic lumbar spine disc and endplate degeneration. Occasional vacuum disc. Mild chronic retrolisthesis of L1 on L2. Lower lumbar facet degeneration. Lumbar vertebrae, sacrum, SI joints, pelvis, and proximal femurs appear intact. Mild posterior left flank subcutaneous contusion series 4, image 84. No other superficial soft tissue injury identified. IMPRESSION: 1. Mild posterior left flank subcutaneous contusion. 2. No other acute traumatic injury identified in the chest, abdomen, or pelvis. 3. Chronic fatty liver disease. Electronically Signed   By: Odessa Fleming M.D.   On: 12/01/2022 08:11   CT Cervical Spine Wo  Contrast  Result Date: 12/01/2022 CLINICAL DATA:  57 year old male status post multiple falls in the past 2 days. Pain. EXAM: CT CERVICAL SPINE WITHOUT CONTRAST TECHNIQUE: Multidetector CT imaging of the cervical spine was performed without intravenous contrast. Multiplanar CT image reconstructions were also generated. RADIATION DOSE REDUCTION: This exam was performed according to the departmental dose-optimization program which includes automated exposure control, adjustment of the mA and/or kV according to patient size and/or use of iterative reconstruction technique. COMPARISON:  CT head and face today reported separately. FINDINGS: Alignment: Straightening of cervical lordosis. Cervicothoracic junction alignment is within normal limits. Bilateral posterior element alignment is within normal limits. Skull base and vertebrae: Bone mineralization is within normal limits. Visualized skull base is intact. No atlanto-occipital dissociation. C1 and C2 appear intact and aligned. Motion artifact most pronounced at C4 and C5. No acute osseous abnormality identified. Soft tissues and spinal canal: No prevertebral fluid or swelling. No visible canal hematoma. Larynx and pharynx motion artifact. Disc levels: Widespread cervical disc and endplate degeneration. 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 Upper chest: Chest CT today reported separately. IMPRESSION: 1. Mildly motion degraded. No acute traumatic injury identified in the cervical spine. 2. Widespread cervical disc and endplate degeneration.  Early OPLL. Electronically Signed   By: Odessa Fleming M.D.   On: 12/01/2022 08:04   CT Maxillofacial WO CM  Result Date: 12/01/2022 CLINICAL DATA:  57 year old male status post multiple falls in the past 2 days. Pain. EXAM: CT MAXILLOFACIAL WITHOUT CONTRAST TECHNIQUE: Multidetector CT imaging of the maxillofacial structures was performed. Multiplanar CT image reconstructions were  also generated. RADIATION DOSE REDUCTION: This exam was performed according to the departmental dose-optimization program which includes automated exposure control, adjustment of the mA and/or kV according to patient size and/or use of iterative reconstruction technique. COMPARISON:  CT head and cervical spine today reported separately. FINDINGS: Osseous: Mandible intact and normally located. No acute dental finding identified. Bilateral maxilla, zygoma, pterygoid, and nasal bones appear intact. Central skull base intact. Cervical spine detailed separately. Orbits: Chronic lamina papyracea fracture on the left. No acute orbital wall fracture. Globes and intraorbital soft tissues appear normal. Sinuses: Well aerated throughout. Tympanic cavities and mastoids appear clear. Soft tissues: Negative visible noncontrast deep soft tissue spaces of the face. No superficial soft tissue injury is identified. Limited intracranial: Reported separately today. IMPRESSION: 1.  No acute traumatic injury identified in the Face. 2. Chronic left lamina papyracea fracture. Electronically Signed   By: Odessa Fleming M.D.   On: 12/01/2022 08:01   DG Chest Port 1 View  Result Date: 12/01/2022 CLINICAL DATA:  57 year old male status post multiple falls in the past 2 days. Pain. EXAM: PORTABLE CHEST 1 VIEW COMPARISON:  Chest radiographs 09/23/2022 and earlier. FINDINGS: Portable AP upright view at  0720 hours. Lung volumes are within normal limits today. Normal cardiac size and mediastinal contours. Visualized tracheal air column is within normal limits. Allowing for portable technique the lungs are clear. No acute osseous abnormality identified. Negative visible bowel gas. IMPRESSION: Negative portable chest. Electronically Signed   By: Odessa Fleming M.D.   On: 12/01/2022 07:51   CT Head Wo Contrast  Result Date: 12/01/2022 CLINICAL DATA:  57 year old male status post multiple falls in the past 2 days. Pain. EXAM: CT HEAD WITHOUT CONTRAST  TECHNIQUE: Contiguous axial images were obtained from the base of the skull through the vertex without intravenous contrast. RADIATION DOSE REDUCTION: This exam was performed according to the departmental dose-optimization program which includes automated exposure control, adjustment of the mA and/or kV according to patient size and/or use of iterative reconstruction technique. COMPARISON:  Head CT and brain MRI 09/29/2022. FINDINGS: Brain: Partially empty sella redemonstrated. Background brain volume within normal limits for age. No midline shift, ventriculomegaly, mass effect, evidence of mass lesion, intracranial hemorrhage or evidence of cortically based acute infarction. Gray-white matter differentiation is within normal limits throughout the brain. Vascular: No suspicious intracranial vascular hyperdensity. Mild Calcified atherosclerosis at the skull base. Skull: Chronic left lamina papyracea fracture. No acute osseous abnormality identified. Sinuses/Orbits: Visualized paranasal sinuses and mastoids are stable and well aerated. Other: Broad-based posterior vertex and left convexity scalp hematoma (series 4, image 72). And there is trace associated scalp soft tissue gas. Underlying calvarium appears intact. Orbits soft tissues appears stable. IMPRESSION: 1. Posterior scalp hematoma, laceration with no underlying skull fracture. 2. Stable and negative for age noncontrast CT appearance of the brain. Electronically Signed   By: Odessa Fleming M.D.   On: 12/01/2022 07:50        Scheduled Meds:  diazepam  5 mg Oral Q6H   docusate sodium  100 mg Oral BID   enoxaparin (LOVENOX) injection  40 mg Subcutaneous Q24H   feeding supplement (GLUCERNA SHAKE)  237 mL Oral TID BM   folic acid  1 mg Intravenous Daily   insulin aspart  0-15 Units Subcutaneous TID WC   insulin aspart  0-5 Units Subcutaneous QHS   LORazepam  0-4 mg Intravenous Q4H   Followed by   Melene Muller ON 12/03/2022] LORazepam  0-4 mg Intravenous Q8H    multivitamin with minerals  1 tablet Oral Daily   potassium chloride  40 mEq Oral BID   sodium chloride flush  3 mL Intravenous Q12H   thiamine  100 mg Oral Daily   Or   thiamine  100 mg Intravenous Daily   Continuous Infusions:  lactated ringers Stopped (12/02/22 0716)     LOS: 1 day      Huey Bienenstock, MD [facility]   To contact the attending provider between 7A-7P or the covering provider during after hours 7P-7A, please log into the web site [email or web address] and access using universal [place] password for that web site. If you do not have the password, please call the hospital operator.  12/02/2022, 12:24 PM

## 2022-12-02 NOTE — Progress Notes (Signed)
Initial Nutrition Assessment  DOCUMENTATION CODES:   Not applicable  INTERVENTION:  - Add Glucerna Shake po TID, each supplement provides 220 kcal and 10 grams of protein  NUTRITION DIAGNOSIS:   Inadequate oral intake related to lethargy/confusion as evidenced by meal completion < 25%.  GOAL:   Patient will meet greater than or equal to 90% of their needs  MONITOR:   PO intake  REASON FOR ASSESSMENT:   Consult Assessment of nutrition requirement/status  ASSESSMENT:   57 y.o. male admits related to falls. PMH includes: alcohol abuse, chest pain, dyslipidemia, elevated liver enzymes, gastritis, HTN. Pt is currently receiving medical management related to alcohol dependence with withdrawal.  Meds reviewed: colace, folic acid, sliding scale insulin, MVI, thiamine. Labs reviewed: Na low, K low.  No answer to room phone. Pt is currently oriented x3. No intakes documented so far this admission. No wt loss per record. RD will add Glucerna TID for now. Will continue to monitor PO intakes.   NUTRITION - FOCUSED PHYSICAL EXAM:  Remote assessment.  Diet Order:   Diet Order             Diet Carb Modified Fluid consistency: Thin; Room service appropriate? Yes  Diet effective now                   EDUCATION NEEDS:   Not appropriate for education at this time  Skin:  Skin Assessment: Reviewed RN Assessment  Last BM:  7/13  Height:   Ht Readings from Last 1 Encounters:  05/01/22 5' 5.98" (1.676 m)    Weight:   Wt Readings from Last 1 Encounters:  05/01/22 81.6 kg    Ideal Body Weight:     BMI:  There is no height or weight on file to calculate BMI.  Estimated Nutritional Needs:   Kcal:  1610-9604 kcals  Protein:  100-120 gm  Fluid:  >/= 2 L  Derek Cruz, RD, LDN, CNSC.

## 2022-12-02 NOTE — Progress Notes (Signed)
RN went to pt room to administer medications. Pt reported to RN that his left elbow hurt because he had fallen. Pt told RN he was coming back from the bathroom and fell on the floor, hitting his elbow. Pt had previously been taken to the bathroom by another nurse and had been instructed in Spanish to pull the call bell in the bathroom when he was ready to go back to bed. Pt agreed to do this at that time. Pt reported that he had pulled the call bell in the bathroom, but no one came, so he attempted to go back to bed on his own. Primary RN noted that neither the bathroom call light nor the room call light were activated. Pt assessed, vitals checked and MD notified. Xray of left elbow ordered. Results pending. Pt is A/ox4 and stand-by/contact guard assist for ambulation.

## 2022-12-03 LAB — GLUCOSE, CAPILLARY
Glucose-Capillary: 183 mg/dL — ABNORMAL HIGH (ref 70–99)
Glucose-Capillary: 124 mg/dL — ABNORMAL HIGH (ref 70–99)
Glucose-Capillary: 198 mg/dL — ABNORMAL HIGH (ref 70–99)
Glucose-Capillary: 153 mg/dL — ABNORMAL HIGH (ref 70–99)

## 2022-12-03 LAB — BASIC METABOLIC PANEL
Anion gap: 10 (ref 5–15)
BUN: 6 mg/dL (ref 6–20)
CO2: 23 mmol/L (ref 22–32)
Calcium: 8.9 mg/dL (ref 8.9–10.3)
Chloride: 100 mmol/L (ref 98–111)
Creatinine, Ser: 0.57 mg/dL — ABNORMAL LOW (ref 0.61–1.24)
GFR, Estimated: >60 mL/min (ref >60)
Glucose, Bld: 131 mg/dL — ABNORMAL HIGH (ref 70–99)
Potassium: 3.5 mmol/L (ref 3.5–5.1)
Sodium: 133 mmol/L — ABNORMAL LOW (ref 135–145)

## 2022-12-03 LAB — CBC
HCT: 39.5 % (ref 39.0–52.0)
Hemoglobin: 14.4 g/dL (ref 13.0–17.0)
MCH: 32.7 pg (ref 26.0–34.0)
MCHC: 36.5 g/dL — ABNORMAL HIGH (ref 30.0–36.0)
MCV: 89.6 fL (ref 80.0–100.0)
Platelets: 159 10*3/uL (ref 150–400)
RBC: 4.41 MIL/uL (ref 4.22–5.81)
RDW: 12.6 % (ref 11.5–15.5)
WBC: 5.9 10*3/uL (ref 4.0–10.5)
nRBC: 0 % (ref 0.0–0.2)

## 2022-12-03 LAB — LIPASE, BLOOD: Lipase: 110 U/L — ABNORMAL HIGH (ref 11–51)

## 2022-12-03 LAB — HEPATIC FUNCTION PANEL
ALT: 62 U/L — ABNORMAL HIGH (ref 0–44)
AST: 86 U/L — ABNORMAL HIGH (ref 15–41)
Albumin: 3.3 g/dL — ABNORMAL LOW (ref 3.5–5.0)
Alkaline Phosphatase: 119 U/L (ref 38–126)
Bilirubin, Direct: 0.1 mg/dL (ref 0.0–0.2)
Indirect Bilirubin: 0.7 mg/dL (ref 0.3–0.9)
Total Bilirubin: 0.8 mg/dL (ref 0.3–1.2)
Total Protein: 6.8 g/dL (ref 6.5–8.1)

## 2022-12-03 LAB — PHOSPHORUS: Phosphorus: 4.2 mg/dL (ref 2.5–4.6)

## 2022-12-03 LAB — MAGNESIUM: Magnesium: 2 mg/dL (ref 1.7–2.4)

## 2022-12-03 MED ORDER — FOLIC ACID 1 MG PO TABS
1.0000 mg | ORAL_TABLET | Freq: Every day | ORAL | Status: DC
  Administered 2022-12-04 – 2022-12-06 (×3): 1 mg via ORAL
  Filled 2022-12-03 (×3): qty 1

## 2022-12-03 MED ORDER — POLYVINYL ALCOHOL 1.4 % OP SOLN
1.0000 [drp] | OPHTHALMIC | Status: DC | PRN
  Filled 2022-12-03: qty 15

## 2022-12-03 NOTE — Progress Notes (Addendum)
PROGRESS NOTE    Derek Cruz  QQV:[id number] DOB: Nov 23, 1965 DOA: 12/01/2022 PCP: Derek Fendt, NP    Chief Complaint  Patient presents with   Fall    No Thinners + ETOH    Brief Narrative:    Derek Cruz is a 57 y.o. male with medical history significant of ETOH dependence, HLD, and HTN presenting with falls in the setting of ETOH use.   He acknowledges drinking about 6 beers daily and said that he has been falling a lot.  He has various MSK pains associated with falls, including back pain.  He desires to stop drinking.  He has h/o possible withdrawal seizures but denies DTs.  He has been to rehab before (?[facility]).  CIWA at the time of my evaluation was 27.patient with known chronic ETOH use, fell yesterday.  Drinking all day.  Still intoxicated on admission, and ETOH withdrawal.  Given Valium and IVF.  And he was admitted for further workup.   Assessment & Plan:   Principal Problem:   Alcohol dependence with withdrawal, uncomplicated (HCC) Active Problems:   Uncontrolled type 2 diabetes mellitus with hyperglycemia, without long-term current use of insulin (HCC)   Hyperlipidemia   Essential hypertension   Frequent falls      ETOH Dependence with withdrawal -Patient with chronic ETOH dependence -Number of drinks per day: reportedly 6 beers (likely significantly more) -Number of UC/ER visits or admissions for management of alcohol withdrawal syndrome (detox): 6 since 09/23/2022 -History of withdrawal seizures, ICU admissions, DTs: reports seizures -Patient is exhibiting active s/sx of withdrawal, CIWA score is 27 -BAL on admission: 220 -He is at high risk for complications of withdrawal including seizures, DTs; he is at high risk for needing Precedex to help get through DTs -Scheduled Valium 5 mg every 6 hours -Continue with CIWA protocol, still requiring significant amount of as needed Ativan. -Folate, thiamine, and MVI ordered -Will provide  fixed-dose (Valium 5 mg PO q6h) and also symptom-triggered BZD (Ativan per CIWA protocol) given his high BAL level; h/o severe withdrawal; and/or current signs of severe withdrawal. -TOC team consult for possible inpatient treatment -Likely needs 78-month driving restriction after discharge -Will also check UDS. -Elevated LFTs are likely related to alcoholism  Falls Likely associated with heavy ETOH use Imaging with contusions but no overt fractures Supportive care -Entirely full yesterday, complaining of mild left elbow pain, no acute finding on physical exam, x-ray with no acute finding.   HTN Hold lisinopril for now   HLD Hold atorvastatin for now   DM Recent A1c was 10.8 - poor control Hold Glucophage Cover with moderate-scale SSI   Hyponatremia -Likely due to beer consumption, stable, mild  Elevated lipase  Mildly elevated at 110 today, no epigastric pain, no evidence of pancreatitis on imaging upon admission-  Transaminitis -Trending up, mildly, due to alcohol   lactic acidosis -Due to above, continue to hold Glucophage, continue with IV fluids, initially trending up, required multiple IV boluses yesterday, back to normal level today.     DVT prophylaxis: Lovenox Code Status: Full Family Communication: none at bedside Disposition: Home when medically stable, remains in active withdrawals, requiring as needed Ativan.  Status is: Inpatient    Consultants:  None   Subjective:  Patient reports she is feeling poorly this morning, ports complains of left sided abdominal pain to staff later in the day.  Objective: Vitals:   12/02/22 1721 12/02/22 2100 12/03/22 0426 12/03/22 0831  BP: (!) 148/98 133/77 127/81  122/70  Pulse: 76 76 76   Resp: 17 18 19 17   Temp: 98.2 F (36.8 C) 98 F (36.7 C) 98.3 F (36.8 C) (!) 97.3 F (36.3 C)  TempSrc: Oral Oral Oral Oral  SpO2: 97% 95% 96% 95%    Intake/Output Summary (Last 24 hours) at 12/03/2022 1226 Last data  filed at 12/03/2022 1000 Gross per 24 hour  Intake 783 ml  Output 1050 ml  Net -267 ml   There were no vitals filed for this visit.  Examination:  Awake Alert, frail, ill-appearing. Symmetrical Chest wall movement, Good air movement bilaterally, CTAB RRR,No Gallops,Rubs or new Murmurs, No Parasternal Heave +ve B.Sounds, Abd Soft, No tenderness, No rebound - guarding or rigidity. No Cyanosis, Clubbing or edema, No new Rash or bruise       Data Reviewed: I have personally reviewed following labs and imaging studies  CBC: Recent Labs  Lab 12/01/22 0700 12/01/22 0705 12/01/22 0858 12/02/22 0312 12/03/22 0532  WBC 7.9  --   --  5.7 5.9  NEUTROABS 6.3  --   --   --   --   HGB 14.6 15.0 12.6* 13.2 14.4  HCT 40.4 44.0 37.0* 36.7* 39.5  MCV 87.8  --   --  89.7 89.6  PLT 212  --   --  159 159    Basic Metabolic Panel: Recent Labs  Lab 12/01/22 0700 12/01/22 0705 12/01/22 0858 12/02/22 0312 12/03/22 0532  NA 134* 135 137 133* 133*  K 3.2* 3.3* 3.4* 3.1* 3.5  CL 96* 98  --  101 100  CO2 22  --   --  24 23  GLUCOSE 147* 145*  --  104* 131*  BUN 6 5*  --  7 6  CREATININE 0.69 0.90  --  0.62 0.57*  CALCIUM 8.2*  --   --  8.3* 8.9  MG 2.2  --   --   --  2.0  PHOS  --   --   --   --  4.2    GFR: CrCl cannot be calculated (Unknown ideal weight.).  Liver Function Tests: Recent Labs  Lab 12/01/22 0700 12/02/22 0312 12/03/22 0532  AST 83* 49* 86*  ALT 43 32 62*  ALKPHOS 99 88 119  BILITOT 0.9 1.2 0.8  PROT 7.3 6.0* 6.8  ALBUMIN 3.9 3.1* 3.3*    CBG: Recent Labs  Lab 12/02/22 1157 12/02/22 1733 12/02/22 2115 12/03/22 0829 12/03/22 1209  GLUCAP 134* 163* 132* 183* 124*     No results found for this or any previous visit (from the past 240 hour(s)).       Radiology Studies: DG Elbow 2 Views Left  Result Date: 12/02/2022 CLINICAL DATA:  Left elbow pain EXAM: LEFT ELBOW - 2 VIEW COMPARISON:  None Available. FINDINGS: There is no evidence of  fracture, dislocation, or joint effusion. Small olecranon spur is present. There is posterior elbow soft tissue swelling. Soft tissues are unremarkable. Antecubital IV is present. IMPRESSION: 1. No acute fracture or dislocation. 2. Posterior elbow soft tissue swelling. Electronically Signed   By: Darliss Cheney M.D.   On: 12/02/2022 22:02        Scheduled Meds:  diazepam  5 mg Oral Q6H   docusate sodium  100 mg Oral BID   enoxaparin (LOVENOX) injection  40 mg Subcutaneous Q24H   feeding supplement (GLUCERNA SHAKE)  237 mL Oral TID BM   folic acid  1 mg Intravenous Daily   insulin aspart  0-15 Units Subcutaneous TID WC   insulin aspart  0-5 Units Subcutaneous QHS   LORazepam  0-4 mg Intravenous Q8H   multivitamin with minerals  1 tablet Oral Daily   sodium chloride flush  3 mL Intravenous Q12H   thiamine  100 mg Oral Daily   Or   thiamine  100 mg Intravenous Daily   Continuous Infusions:  lactated ringers 75 mL/hr at 12/02/22 2021     LOS: 2 days      Huey Bienenstock, MD [facility]   To contact the attending provider between 7A-7P or the covering provider during after hours 7P-7A, please log into the web site [email or web address] and access using universal [place] password for that web site. If you do not have the password, please call the hospital operator.  12/03/2022, 12:26 PM

## 2022-12-03 NOTE — TOC Initial Note (Signed)
Transition of Care [facility]) - Initial/Assessment Note    Patient Details  Name: Derek Cruz MRN: [id number] Date of Birth: [birth year]  Transition of Care [facility]) CM/SW Contact:    Mearl Latin, LCSW Phone Number: 12/03/2022, 5:32 PM  Clinical Narrative:                 Patient admitted from home with alcohol use and falls. Patient is uninsured and [place] speaking. TOC to continue to follow.     Barriers to Discharge: Continued Medical Work up   Patient Goals and CMS Choice            Expected Discharge Plan and Services       Living arrangements for the past 2 months: Mobile Home                                      Prior Living Arrangements/Services Living arrangements for the past 2 months: Mobile Home Lives with:: Spouse Patient language and need for interpreter reviewed:: Yes (Spanish Speaking)        Need for Family Participation in Patient Care: No (Comment)     Criminal Activity/Legal Involvement Pertinent to Current Situation/Hospitalization: No - Comment as needed  Activities of Daily Living Home Assistive Devices/Equipment: None ADL Screening (condition at time of admission) Patient's cognitive ability adequate to safely complete daily activities?: Yes Is the patient deaf or have difficulty hearing?: No Does the patient have difficulty seeing, even when wearing glasses/contacts?: No Does the patient have difficulty concentrating, remembering, or making decisions?: No Patient able to express need for assistance with ADLs?: Yes Does the patient have difficulty dressing or bathing?: No Independently performs ADLs?: Yes (appropriate for developmental age) Does the patient have difficulty walking or climbing stairs?: No Weakness of Legs: None Weakness of Arms/Hands: None  Permission Sought/Granted                  Emotional Assessment Appearance:: Appears stated age     Orientation: : Oriented to Self, Oriented to Place,  Oriented to  Time, Oriented to Situation Alcohol / Substance Use: Alcohol Use Psych Involvement: No (comment)  Admission diagnosis:  Alcohol dependence with withdrawal, uncomplicated (HCC) [F10.230] Fall, initial encounter [W19.XXXA] Alcohol withdrawal syndrome with complication [facility]) [F10.939] Patient Active Problem List   Diagnosis Date Noted   Alcohol dependence with withdrawal, uncomplicated (HCC) 12/01/2022   Frequent falls 12/01/2022   Elevated liver enzymes    Alcohol abuse    Liver failure (HCC) 05/07/2020   Alcoholic hepatitis    Hyponatremia    Polycythemia    Hemoglobin A1c less than 7.0% 07/16/2019   Neck pain 03/31/2019   Muscle strain 03/31/2019   Undifferentiated abdominal pain 03/31/2019   Gastroesophageal reflux disease without esophagitis 11/24/2018   Chest discomfort 11/24/2018   Essential hypertension 11/24/2018   Pre-diabetes 11/24/2018   Hyperlipidemia 10/06/2016   Chest pain 09/26/2016   Uncontrolled type 2 diabetes mellitus with hyperglycemia, without long-term current use of insulin (HCC) 09/26/2016   PCP:  Rema Fendt, NP Pharmacy:   [facility] [address] [place] [address] Phone: [phone] Fax: [phone]     Social Determinants of Health (SDOH) Social History: SDOH Screenings   Food Insecurity: No Food Insecurity (12/02/2022)  Housing: Low Risk  (12/02/2022)  Transportation Needs: No Transportation Needs (12/02/2022)  Utilities: Not At Risk (  12/02/2022)  Depression (PHQ2-9): Low Risk  (10/01/2022)  Tobacco Use: Medium Risk (09/29/2022)   SDOH Interventions:     Readmission Risk Interventions     No data to display

## 2022-12-04 LAB — GLUCOSE, CAPILLARY
Glucose-Capillary: 162 mg/dL — ABNORMAL HIGH (ref 70–99)
Glucose-Capillary: 118 mg/dL — ABNORMAL HIGH (ref 70–99)
Glucose-Capillary: 125 mg/dL — ABNORMAL HIGH (ref 70–99)
Glucose-Capillary: 148 mg/dL — ABNORMAL HIGH (ref 70–99)

## 2022-12-04 LAB — BASIC METABOLIC PANEL
Anion gap: 8 (ref 5–15)
BUN: 7 mg/dL (ref 6–20)
CO2: 25 mmol/L (ref 22–32)
Calcium: 9.3 mg/dL (ref 8.9–10.3)
Chloride: 102 mmol/L (ref 98–111)
Creatinine, Ser: 0.72 mg/dL (ref 0.61–1.24)
GFR, Estimated: >60 mL/min (ref >60)
Glucose, Bld: 123 mg/dL — ABNORMAL HIGH (ref 70–99)
Potassium: 3.6 mmol/L (ref 3.5–5.1)
Sodium: 135 mmol/L (ref 135–145)

## 2022-12-04 LAB — PHOSPHORUS: Phosphorus: 4.7 mg/dL — ABNORMAL HIGH (ref 2.5–4.6)

## 2022-12-04 LAB — CBC
HCT: 41.2 % (ref 39.0–52.0)
Hemoglobin: 14.7 g/dL (ref 13.0–17.0)
MCH: 31.7 pg (ref 26.0–34.0)
MCHC: 35.7 g/dL (ref 30.0–36.0)
MCV: 88.8 fL (ref 80.0–100.0)
Platelets: 183 10*3/uL (ref 150–400)
RBC: 4.64 MIL/uL (ref 4.22–5.81)
RDW: 12.7 % (ref 11.5–15.5)
WBC: 6.7 10*3/uL (ref 4.0–10.5)
nRBC: 0 % (ref 0.0–0.2)

## 2022-12-04 LAB — MAGNESIUM: Magnesium: 1.9 mg/dL (ref 1.7–2.4)

## 2022-12-04 MED ORDER — DIAZEPAM 5 MG PO TABS
5.0000 mg | ORAL_TABLET | Freq: Once | ORAL | Status: AC
  Administered 2022-12-04: 5 mg via ORAL
  Filled 2022-12-04: qty 1

## 2022-12-04 MED ORDER — LORAZEPAM 2 MG/ML IJ SOLN
1.0000 mg | INTRAMUSCULAR | Status: DC | PRN
  Administered 2022-12-05: 2 mg via INTRAVENOUS
  Filled 2022-12-04: qty 1

## 2022-12-04 MED ORDER — LORAZEPAM 1 MG PO TABS
1.0000 mg | ORAL_TABLET | ORAL | Status: DC | PRN
  Administered 2022-12-04: 2 mg via ORAL
  Filled 2022-12-04: qty 2

## 2022-12-04 MED ORDER — CHLORDIAZEPOXIDE HCL 5 MG PO CAPS
10.0000 mg | ORAL_CAPSULE | Freq: Four times a day (QID) | ORAL | Status: DC
  Administered 2022-12-04 – 2022-12-06 (×8): 10 mg via ORAL
  Filled 2022-12-04 (×8): qty 2

## 2022-12-04 MED ORDER — THIAMINE HCL 100 MG/ML IJ SOLN
500.0000 mg | Freq: Three times a day (TID) | INTRAVENOUS | Status: DC
  Administered 2022-12-04 – 2022-12-05 (×4): 500 mg via INTRAVENOUS
  Filled 2022-12-04 (×6): qty 5

## 2022-12-04 MED ORDER — OLANZAPINE 5 MG PO TABS
10.0000 mg | ORAL_TABLET | Freq: Once | ORAL | Status: AC
  Administered 2022-12-04: 10 mg via ORAL
  Filled 2022-12-04 (×2): qty 2

## 2022-12-04 MED ORDER — HALOPERIDOL LACTATE 5 MG/ML IJ SOLN
5.0000 mg | Freq: Once | INTRAMUSCULAR | Status: AC | PRN
  Administered 2022-12-04: 5 mg via INTRAVENOUS
  Filled 2022-12-04: qty 1

## 2022-12-04 NOTE — Progress Notes (Signed)
PROGRESS NOTE    Derek Cruz  ZOX:[id number] DOB: Dec 22, 1965 DOA: 12/01/2022 PCP: Rema Fendt, NP    Chief Complaint  Patient presents with   Fall    No Thinners + ETOH    Brief Narrative:    Derek Cruz is a 57 y.o. male with medical history significant of ETOH dependence, HLD, and HTN presenting with falls in the setting of ETOH use.   He acknowledges drinking about 6 beers daily and said that he has been falling a lot.  He has various MSK pains associated with falls, including back pain.  He desires to stop drinking.  He has h/o possible withdrawal seizures but denies DTs.  He has been to rehab before (?[facility]).  CIWA at the time of my evaluation was 27.patient with known chronic ETOH use, fell yesterday.  Drinking all day.  Still intoxicated on admission, and ETOH withdrawal.  Given Valium and IVF.  And he was admitted for further workup.   Assessment & Plan:   Principal Problem:   Alcohol dependence with withdrawal, uncomplicated (HCC) Active Problems:   Uncontrolled type 2 diabetes mellitus with hyperglycemia, without long-term current use of insulin (HCC)   Hyperlipidemia   Essential hypertension   Frequent falls      ETOH Dependence with withdrawal/DTs -Patient with chronic ETOH dependence -Number of drinks per day: reportedly 6 beers (likely significantly more) -Number of UC/ER visits or admissions for management of alcohol withdrawal syndrome (detox): 6 since 09/23/2022 -History of withdrawal seizures, ICU admissions, DTs: reports seizures -Patient is exhibiting active s/sx of withdrawal, on admission, for his CIWA score was 27.  -He is at high risk for complications of withdrawal including seizures, DTs; he is at high risk for needing Precedex to help get through DTs -Folate, thiamine, and MVI ordered -TOC team consult for possible inpatient treatment -Likely needs 53-month driving restriction after discharge -Elevated LFTs are  likely related to alcoholism -Patient with severe withdrawals despite being on CIWA protocol, and receiving scheduled Valium, with episodes of agitation and restlessness over night requiring restraints and haloperidol, will change his Valium to Librium.  Falls Likely associated with heavy ETOH use Imaging with contusions but no overt fractures Supportive care -Entirely full yesterday, complaining of mild left elbow pain, no acute finding on physical exam, x-ray with no acute finding.   HTN Hold lisinopril for now   HLD Hold atorvastatin for now   DM Recent A1c was 10.8 - poor control Hold Glucophage Cover with moderate-scale SSI   Hyponatremia -Likely due to beer consumption, stable, mild  Elevated lipase  Mildly elevated at 110 today, no epigastric pain, no evidence of pancreatitis on imaging upon admission-  Transaminitis -Trending up, mildly, due to alcohol   lactic acidosis -Due to above, continue to hold Glucophage, continue with IV fluids, initially trending up, required multiple IV boluses yesterday, back to normal level today.     DVT prophylaxis: Lovenox Code Status: Full Family Communication: none at bedside Disposition: Home when medically stable, remains in active withdrawals, requiring as needed Ativan.  Status is: Inpatient    Consultants:  None   Subjective:  Patient significantly confused today, with agitation and restlessness overnight requiring restraints and antipsychotic medications  Objective: Vitals:   12/03/22 1600 12/04/22 0100 12/04/22 0755 12/04/22 0829  BP: 129/81 (!) 149/94    Pulse: 95 79    Resp: 16 16  16   Temp: 98.4 F (36.9 C) (!) 97.3 F (36.3 C)  98 F (36.7  C)  TempSrc: Oral Oral Oral Oral  SpO2:  95%  97%    Intake/Output Summary (Last 24 hours) at 12/04/2022 1226 Last data filed at 12/04/2022 0835 Gross per 24 hour  Intake 453 ml  Output 800 ml  Net -347 ml   There were no vitals filed for this  visit.  Examination:  Confused, but in no apparent distress, comfortable, as he just received as needed Ativan Symmetrical Chest wall movement, Good air movement bilaterally, CTAB RRR,No Gallops,Rubs or new Murmurs, No Parasternal Heave +ve B.Sounds, Abd Soft, No tenderness, No rebound - guarding or rigidity. No Cyanosis, Clubbing or edema, No new Rash or bruise        Data Reviewed: I have personally reviewed following labs and imaging studies  CBC: Recent Labs  Lab 12/01/22 0700 12/01/22 0705 12/01/22 0858 12/02/22 0312 12/03/22 0532 12/04/22 0234  WBC 7.9  --   --  5.7 5.9 6.7  NEUTROABS 6.3  --   --   --   --   --   HGB 14.6 15.0 12.6* 13.2 14.4 14.7  HCT 40.4 44.0 37.0* 36.7* 39.5 41.2  MCV 87.8  --   --  89.7 89.6 88.8  PLT 212  --   --  159 159 183    Basic Metabolic Panel: Recent Labs  Lab 12/01/22 0700 12/01/22 0705 12/01/22 0858 12/02/22 0312 12/03/22 0532 12/04/22 0234  NA 134* 135 137 133* 133* 135  K 3.2* 3.3* 3.4* 3.1* 3.5 3.6  CL 96* 98  --  101 100 102  CO2 22  --   --  24 23 25   GLUCOSE 147* 145*  --  104* 131* 123*  BUN 6 5*  --  7 6 7   CREATININE 0.69 0.90  --  0.62 0.57* 0.72  CALCIUM 8.2*  --   --  8.3* 8.9 9.3  MG 2.2  --   --   --  2.0 1.9  PHOS  --   --   --   --  4.2 4.7*    GFR: CrCl cannot be calculated (Unknown ideal weight.).  Liver Function Tests: Recent Labs  Lab 12/01/22 0700 12/02/22 0312 12/03/22 0532  AST 83* 49* 86*  ALT 43 32 62*  ALKPHOS 99 88 119  BILITOT 0.9 1.2 0.8  PROT 7.3 6.0* 6.8  ALBUMIN 3.9 3.1* 3.3*    CBG: Recent Labs  Lab 12/03/22 1209 12/03/22 1635 12/03/22 2105 12/04/22 0754 12/04/22 1223  GLUCAP 124* 198* 153* 162* 118*     No results found for this or any previous visit (from the past 240 hour(s)).       Radiology Studies: DG Elbow 2 Views Left  Result Date: 12/02/2022 CLINICAL DATA:  Left elbow pain EXAM: LEFT ELBOW - 2 VIEW COMPARISON:  None Available. FINDINGS: There is  no evidence of fracture, dislocation, or joint effusion. Small olecranon spur is present. There is posterior elbow soft tissue swelling. Soft tissues are unremarkable. Antecubital IV is present. IMPRESSION: 1. No acute fracture or dislocation. 2. Posterior elbow soft tissue swelling. Electronically Signed   By: Darliss Cheney M.D.   On: 12/02/2022 22:02        Scheduled Meds:  chlordiazePOXIDE  10 mg Oral QID   docusate sodium  100 mg Oral BID   enoxaparin (LOVENOX) injection  40 mg Subcutaneous Q24H   feeding supplement (GLUCERNA SHAKE)  237 mL Oral TID BM   folic acid  1 mg Oral Daily   insulin  aspart  0-15 Units Subcutaneous TID WC   insulin aspart  0-5 Units Subcutaneous QHS   LORazepam  0-4 mg Intravenous Q8H   multivitamin with minerals  1 tablet Oral Daily   sodium chloride flush  3 mL Intravenous Q12H   Continuous Infusions:  lactated ringers 75 mL/hr at 12/02/22 2021   thiamine (VITAMIN B1) injection 500 mg (12/04/22 0835)     LOS: 3 days      Huey Bienenstock, MD [facility]   To contact the attending provider between 7A-7P or the covering provider during after hours 7P-7A, please log into the web site [email or web address] and access using universal [place] password for that web site. If you do not have the password, please call the hospital operator.  12/04/2022, 12:26 PM

## 2022-12-04 NOTE — Progress Notes (Addendum)
TRH night cross cover note:   Patient with restlessness, difficulty sleeping, repeating getting out of bed. He is here with alcohol withdrawal, currently on ciwa receiving prn ativan and scheduled valium. I've ordered an extra dose of valium 5 mg po x 1 now as well as a dose of prn iv haldol to try to address the above.   Update: patient remains restless, attempting to get out bed, with these behaviors refractory to verbal redirection.  In the setting of associated interference with ongoing medical treatment posing potential harm to themself, I have placed orders for lap belt and dose of zyprexa.    Newton Pigg, DO Hospitalist

## 2022-12-05 LAB — CBC
HCT: 41 % (ref 39.0–52.0)
Hemoglobin: 14.6 g/dL (ref 13.0–17.0)
MCH: 32.6 pg (ref 26.0–34.0)
MCHC: 35.6 g/dL (ref 30.0–36.0)
MCV: 91.5 fL (ref 80.0–100.0)
Platelets: 178 10*3/uL (ref 150–400)
RBC: 4.48 MIL/uL (ref 4.22–5.81)
RDW: 13.2 % (ref 11.5–15.5)
WBC: 6.9 10*3/uL (ref 4.0–10.5)
nRBC: 0 % (ref 0.0–0.2)

## 2022-12-05 LAB — BASIC METABOLIC PANEL
Anion gap: 12 (ref 5–15)
BUN: 16 mg/dL (ref 6–20)
CO2: 21 mmol/L — ABNORMAL LOW (ref 22–32)
Calcium: 9.1 mg/dL (ref 8.9–10.3)
Chloride: 104 mmol/L (ref 98–111)
Creatinine, Ser: 0.74 mg/dL (ref 0.61–1.24)
GFR, Estimated: >60 mL/min (ref >60)
Glucose, Bld: 127 mg/dL — ABNORMAL HIGH (ref 70–99)
Potassium: 3.8 mmol/L (ref 3.5–5.1)
Sodium: 137 mmol/L (ref 135–145)

## 2022-12-05 LAB — PHOSPHORUS: Phosphorus: 4.8 mg/dL — ABNORMAL HIGH (ref 2.5–4.6)

## 2022-12-05 LAB — MAGNESIUM: Magnesium: 2.1 mg/dL (ref 1.7–2.4)

## 2022-12-05 LAB — GLUCOSE, CAPILLARY
Glucose-Capillary: 142 mg/dL — ABNORMAL HIGH (ref 70–99)
Glucose-Capillary: 137 mg/dL — ABNORMAL HIGH (ref 70–99)
Glucose-Capillary: 135 mg/dL — ABNORMAL HIGH (ref 70–99)
Glucose-Capillary: 227 mg/dL — ABNORMAL HIGH (ref 70–99)

## 2022-12-05 MED ORDER — THIAMINE HCL 100 MG/ML IJ SOLN
250.0000 mg | INTRAVENOUS | Status: DC
  Filled 2022-12-05: qty 2.5

## 2022-12-05 MED ORDER — THIAMINE MONONITRATE 100 MG PO TABS: 100.0000 mg | ORAL_TABLET | Freq: Every day | ORAL | Status: DC

## 2022-12-05 MED ORDER — THIAMINE HCL 100 MG/ML IJ SOLN
500.0000 mg | Freq: Three times a day (TID) | INTRAVENOUS | Status: DC
  Administered 2022-12-05 – 2022-12-06 (×3): 500 mg via INTRAVENOUS
  Filled 2022-12-05 (×5): qty 5

## 2022-12-05 NOTE — Plan of Care (Signed)

## 2022-12-05 NOTE — Plan of Care (Signed)
   Problem: Education: Goal: Knowledge of General Education information will improve Description: Including pain rating scale, medication(s)/side effects and non-pharmacologic comfort measures Outcome: Progressing   Problem: Health Behavior/Discharge Planning: Goal: Ability to manage health-related needs will improve Outcome: Progressing   Problem: Nutrition: Goal: Adequate nutrition will be maintained Outcome: Progressing   Problem: Pain Managment: Goal: General experience of comfort will improve Outcome: Progressing   Problem: Safety: Goal: Ability to remain free from injury will improve Outcome: Progressing   

## 2022-12-05 NOTE — Progress Notes (Signed)
   12/04/22 2019  Assess: MEWS Score  Temp 98.3 F (36.8 C)  BP (!) 130/96  MAP (mmHg) 105  Pulse Rate (!) 111  ECG Heart Rate (!) 111  Resp 20  SpO2 94 %  Assess: MEWS Score  MEWS Temp 0  MEWS Systolic 0  MEWS Pulse 2  MEWS RR 0  MEWS LOC 0  MEWS Score 2  MEWS Score Color Yellow  Assess: if the MEWS score is Yellow or Red  Were vital signs taken at a resting state? No  Focused Assessment Change from prior assessment (see assessment flowsheet)  Does the patient meet 2 or more of the SIRS criteria? No  MEWS guidelines implemented  Yes, yellow  Treat  MEWS Interventions Considered administering scheduled or prn medications/treatments as ordered  Take Vital Signs  Increase Vital Sign Frequency  Yellow: Q2hr x1, continue Q4hrs until patient remains green for 12hrs  Escalate  MEWS: Escalate Yellow: Discuss with charge nurse and consider notifying provider and/or RRT  Notify: Charge Nurse/RN  Name of Charge Nurse/RN Notified Starla Link RN  Assess: SIRS CRITERIA  SIRS Temperature  0  SIRS Pulse 1  SIRS Respirations  0  SIRS WBC 0  SIRS Score Sum  1

## 2022-12-05 NOTE — Progress Notes (Signed)
Order for restraints has been discontinued. Pt was never placed in restraints during this hospital admission.

## 2022-12-05 NOTE — Progress Notes (Signed)
PROGRESS NOTE    Derek Cruz  UUV:[id number] DOB: 1966/01/07 DOA: 12/01/2022 PCP: Rema Fendt, NP    Chief Complaint  Patient presents with   Fall    No Thinners + ETOH    Brief Narrative:    Derek Cruz is a 57 y.o. male with medical history significant of ETOH dependence, HLD, and HTN presenting with falls in the setting of ETOH use.   He acknowledges drinking about 6 beers daily and said that he has been falling a lot.  He has various MSK pains associated with falls, including back pain.  He desires to stop drinking.  He has h/o possible withdrawal seizures but denies DTs.  He has been to rehab before (?[facility]).  CIWA at the time of my evaluation was 27.patient with known chronic ETOH use, fell yesterday.  Drinking all day.  Still intoxicated on admission, and ETOH withdrawal.  Given Valium and IVF.  And he was admitted for further workup.   Assessment & Plan:   Principal Problem:   Alcohol dependence with withdrawal, uncomplicated (HCC) Active Problems:   Uncontrolled type 2 diabetes mellitus with hyperglycemia, without long-term current use of insulin (HCC)   Hyperlipidemia   Essential hypertension   Frequent falls      ETOH Dependence with withdrawal/DTs -Patient with chronic ETOH dependence -Number of drinks per day: reportedly 6 beers (likely significantly more) -Number of UC/ER visits or admissions for management of alcohol withdrawal syndrome (detox): 6 since 09/23/2022 -History of withdrawal seizures, ICU admissions, DTs: reports seizures -Patient is exhibiting active s/sx of withdrawal, on admission, for his CIWA score was 27.  -He is at high risk for complications of withdrawal including seizures, DTs; he is at high risk for needing Precedex to help get through DTs -Folate, thiamine, and MVI ordered -TOC team consult for possible inpatient treatment -Likely needs 36-month driving restriction after discharge -Elevated LFTs are  likely related to alcoholism -Patient with severe withdrawals despite being on CIWA protocol, improved after switching Valium to Librium, will continue at current days, no further requirement of restraints or haloperidol.    Falls Likely associated with heavy ETOH use Imaging with contusions but no overt fractures Supportive care -Entirely full yesterday, complaining of mild left elbow pain, no acute finding on physical exam, x-ray with no acute finding.   HTN Hold lisinopril for now   HLD Hold atorvastatin for now   DM Recent A1c was 10.8 - poor control Hold Glucophage Cover with moderate-scale SSI   Hyponatremia -Likely due to beer consumption, stable, mild  Elevated lipase  Mildly elevated at 110 today, no epigastric pain, no evidence of pancreatitis on imaging upon admission-  Transaminitis -Trending up, mildly, due to alcohol   lactic acidosis -Due to above, continue to hold Glucophage, continue with IV fluids, initially trending up, required multiple IV boluses yesterday, back to normal level today.     DVT prophylaxis: Lovenox Code Status: Full Family Communication: none at bedside Disposition: Home when medically stable, remains in active withdrawals, requiring as needed Ativan.  Status is: Inpatient    Consultants:  None   Subjective:  Significant events overnight as discussed with staff  Objective: Vitals:   12/05/22 0000 12/05/22 0400 12/05/22 0925 12/05/22 1138  BP: (!) 124/91 123/89 122/79 108/77  Pulse:  83 75 74  Resp: 16  18 20   Temp: 98 F (36.7 C) 98.2 F (36.8 C) 98.3 F (36.8 C) 98.2 F (36.8 C)  TempSrc:  Oral Oral Oral  SpO2:  95% 97% 95%    Intake/Output Summary (Last 24 hours) at 12/05/2022 1221 Last data filed at 12/05/2022 1000 Gross per 24 hour  Intake 2529.1 ml  Output --  Net 2529.1 ml   There were no vitals filed for this visit.  Examination:  Peers to be more awake and appropriate today, more communicative, no  apparent distress Symmetrical Chest wall movement, Good air movement bilaterally, CTAB RRR,No Gallops,Rubs or new Murmurs, No Parasternal Heave +ve B.Sounds, Abd Soft, No tenderness, No rebound - guarding or rigidity. No Cyanosis, Clubbing or edema, No new Rash or bruise         Data Reviewed: I have personally reviewed following labs and imaging studies  CBC: Recent Labs  Lab 12/01/22 0700 12/01/22 0705 12/01/22 0858 12/02/22 0312 12/03/22 0532 12/04/22 0234 12/05/22 0546  WBC 7.9  --   --  5.7 5.9 6.7 6.9  NEUTROABS 6.3  --   --   --   --   --   --   HGB 14.6   < > 12.6* 13.2 14.4 14.7 14.6  HCT 40.4   < > 37.0* 36.7* 39.5 41.2 41.0  MCV 87.8  --   --  89.7 89.6 88.8 91.5  PLT 212  --   --  159 159 183 178   < > = values in this interval not displayed.    Basic Metabolic Panel: Recent Labs  Lab 12/01/22 0700 12/01/22 0705 12/01/22 0858 12/02/22 0312 12/03/22 0532 12/04/22 0234 12/05/22 0546  NA 134* 135 137 133* 133* 135 137  K 3.2* 3.3* 3.4* 3.1* 3.5 3.6 3.8  CL 96* 98  --  101 100 102 104  CO2 22  --   --  24 23 25  21*  GLUCOSE 147* 145*  --  104* 131* 123* 127*  BUN 6 5*  --  7 6 7 16   CREATININE 0.69 0.90  --  0.62 0.57* 0.72 0.74  CALCIUM 8.2*  --   --  8.3* 8.9 9.3 9.1  MG 2.2  --   --   --  2.0 1.9 2.1  PHOS  --   --   --   --  4.2 4.7* 4.8*    GFR: CrCl cannot be calculated (Unknown ideal weight.).  Liver Function Tests: Recent Labs  Lab 12/01/22 0700 12/02/22 0312 12/03/22 0532  AST 83* 49* 86*  ALT 43 32 62*  ALKPHOS 99 88 119  BILITOT 0.9 1.2 0.8  PROT 7.3 6.0* 6.8  ALBUMIN 3.9 3.1* 3.3*    CBG: Recent Labs  Lab 12/04/22 1223 12/04/22 1601 12/04/22 2157 12/05/22 0924 12/05/22 1138  GLUCAP 118* 125* 148* 142* 137*     No results found for this or any previous visit (from the past 240 hour(s)).       Radiology Studies: No results found.      Scheduled Meds:  chlordiazePOXIDE  10 mg Oral QID   docusate sodium   100 mg Oral BID   enoxaparin (LOVENOX) injection  40 mg Subcutaneous Q24H   feeding supplement (GLUCERNA SHAKE)  237 mL Oral TID BM   folic acid  1 mg Oral Daily   insulin aspart  0-15 Units Subcutaneous TID WC   insulin aspart  0-5 Units Subcutaneous QHS   multivitamin with minerals  1 tablet Oral Daily   sodium chloride flush  3 mL Intravenous Q12H   [START ON 12/10/2022] thiamine  100 mg Oral Daily   Continuous Infusions:  lactated ringers 75 mL/hr at 12/05/22 0509   thiamine (VITAMIN B1) injection     Followed by   Melene Muller ON 12/07/2022] thiamine (VITAMIN B1) injection       LOS: 4 days      Huey Bienenstock, MD [facility]   To contact the attending provider between 7A-7P or the covering provider during after hours 7P-7A, please log into the web site [email or web address] and access using universal [place] password for that web site. If you do not have the password, please call the hospital operator.  12/05/2022, 12:21 PM

## 2022-12-05 NOTE — Evaluation (Signed)
Physical Therapy Evaluation Patient Details Name: Derek Cruz MRN: [id number] DOB: [birth year] Today's Date: 12/05/2022  History of Present Illness  57 y.o. male presents to [facility] hospital on 12/01/2022 with multiple falls in the setting of ETOH abuse. PMH includes ETOH dependence, HLD, HTN, DMII  Clinical Impression  Pt presents to PT with deficits in coordination, gait, balance, endurance. Pt demonstrates the tendency to drift laterally, more so to right side than left. Pt reports not having many opportunities to ambulate since being at the hospital. PT encourages frequent ambulation with staff assistance in an effort to improve stability and to restore independence. PT is hopeful the pt will progress back to baseline with more opportunities to ambulate.      Assistance Recommended at Discharge Intermittent Supervision/Assistance  If plan is discharge home, recommend the following:  Can travel by private vehicle  A little help with walking and/or transfers;A little help with bathing/dressing/bathroom;Assistance with cooking/housework;Assist for transportation;Help with stairs or ramp for entrance        Equipment Recommendations Gilmer Mor (may progress to no needs if mobilized frequently)  Recommendations for Other Services       Functional Status Assessment Patient has had a recent decline in their functional status and demonstrates the ability to make significant improvements in function in a reasonable and predictable amount of time.     Precautions / Restrictions Precautions Precautions: Fall Restrictions Weight Bearing Restrictions: No      Mobility  Bed Mobility Overal bed mobility: Modified Independent                  Transfers Overall transfer level: Needs assistance Equipment used: None Transfers: Sit to/from Stand Sit to Stand: Supervision                Ambulation/Gait Ambulation/Gait assistance: Supervision Gait Distance (Feet): 500  Feet Assistive device: None Gait Pattern/deviations: Step-through pattern, Drifts right/left Gait velocity: functional Gait velocity interpretation: 1.31 - 2.62 ft/sec, indicative of limited community ambulator   General Gait Details: pt with tendency to drift laterally, right moreso than left. Pt with instances of staggering with head turns but corrects balance with stepping strategy. Pt also adjusts gait speed significantly in order to alter stride length when attempting to step over objects  Stairs            Wheelchair Mobility     Tilt Bed    Modified Rankin (Stroke Patients Only)       Balance Overall balance assessment: Needs assistance Sitting-balance support: No upper extremity supported, Feet supported Sitting balance-Leahy Scale: Good     Standing balance support: No upper extremity supported, During functional activity Standing balance-Leahy Scale: Good             Rhomberg - Eyes Closed: 30                 Pertinent Vitals/Pain Pain Assessment Pain Assessment: Faces Faces Pain Scale: Hurts little more Pain Location: back Pain Descriptors / Indicators: Sore Pain Intervention(s): Monitored during session    Home Living Family/patient expects to be discharged to:: Private residence Living Arrangements: Children (58 y.o. son) Available Help at Discharge: Family;Available PRN/intermittently Type of Home: Mobile home Home Access: Stairs to enter Entrance Stairs-Rails: Can reach both Entrance Stairs-Number of Steps: 5   Home Layout: One level Home Equipment: None      Prior Function Prior Level of Function : Independent/Modified Independent;Working/employed;Driving             Mobility Comments:  works in [employer]        Extremity/Trunk Assessment   Upper Extremity Assessment Upper Extremity Assessment: RUE deficits/detail;LUE deficits/detail RUE Coordination: decreased fine motor LUE Coordination:  decreased fine motor         Cervical / Trunk Assessment Cervical / Trunk Assessment: Normal  Communication   Communication: No difficulties (pt declines the need for spanish interpreter)  Cognition Arousal/Alertness: Awake/alert Behavior During Therapy: WFL for tasks assessed/performed Overall Cognitive Status: Within Functional Limits for tasks assessed                                          General Comments General comments (skin integrity, edema, etc.): VSS on RA    Exercises     Assessment/Plan    PT Assessment Patient needs continued PT services  PT Problem List Decreased balance;Decreased mobility;Decreased coordination       PT Treatment Interventions DME instruction;Gait training;Stair training;Functional mobility training;Therapeutic activities;Balance training;Neuromuscular re-education;Patient/family education    PT Goals (Current goals can be found in the Care Plan section)  Acute Rehab PT Goals Patient Stated Goal: to stop falling PT Goal Formulation: With patient Time For Goal Achievement: 12/19/22 Potential to Achieve Goals: Good Additional Goals Additional Goal #1: Pt will score >19/24 on the DGI to indicate a reduced risk for falls Additional Goal #2: Pt will score >45/56 on the BERG to indicate a reduced risk for falls    Frequency Min 3X/week     Co-evaluation               AM-PAC PT "6 Clicks" Mobility  Outcome Measure Help needed turning from your back to your side while in a flat bed without using bedrails?: None Help needed moving from lying on your back to sitting on the side of a flat bed without using bedrails?: None Help needed moving to and from a bed to a chair (including a wheelchair)?: A Little Help needed standing up from a chair using your arms (e.g., wheelchair or bedside chair)?: A Little Help needed to walk in hospital room?: A Little Help needed climbing 3-5 steps with a railing? : A Little 6 Click  Score: 20    End of Session Equipment Utilized During Treatment: Gait belt Activity Tolerance: Patient tolerated treatment well Patient left: in bed;with call bell/phone within reach;with bed alarm set Nurse Communication: Mobility status PT Visit Diagnosis: Other abnormalities of gait and mobility (R26.89);Other symptoms and signs involving the nervous system (R29.898);History of falling (Z91.81)    Time: 1914-7829 PT Time Calculation (min) (ACUTE ONLY): 28 min   Charges:   PT Evaluation $PT Eval Low Complexity: 1 Low   PT General Charges $$ ACUTE PT VISIT: 1 Visit         Arlyss Gandy, PT, DPT Acute Rehabilitation Office [phone]   Arlyss Gandy 12/05/2022, 4:19 PM

## 2022-12-06 ENCOUNTER — Other Ambulatory Visit (HOSPITAL_COMMUNITY): Payer: Self-pay

## 2022-12-06 LAB — CBC
HCT: 40.2 % (ref 39.0–52.0)
Hemoglobin: 14.3 g/dL (ref 13.0–17.0)
MCH: 32.1 pg (ref 26.0–34.0)
MCHC: 35.6 g/dL (ref 30.0–36.0)
MCV: 90.1 fL (ref 80.0–100.0)
Platelets: 174 10*3/uL (ref 150–400)
RBC: 4.46 MIL/uL (ref 4.22–5.81)
RDW: 13.2 % (ref 11.5–15.5)
WBC: 7.2 10*3/uL (ref 4.0–10.5)
nRBC: 0.3 % — ABNORMAL HIGH (ref 0.0–0.2)

## 2022-12-06 LAB — BASIC METABOLIC PANEL
Anion gap: 9 (ref 5–15)
BUN: 11 mg/dL (ref 6–20)
CO2: 23 mmol/L (ref 22–32)
Calcium: 8.9 mg/dL (ref 8.9–10.3)
Chloride: 104 mmol/L (ref 98–111)
Creatinine, Ser: 0.6 mg/dL — ABNORMAL LOW (ref 0.61–1.24)
GFR, Estimated: >60 mL/min (ref >60)
Glucose, Bld: 125 mg/dL — ABNORMAL HIGH (ref 70–99)
Potassium: 3.7 mmol/L (ref 3.5–5.1)
Sodium: 136 mmol/L (ref 135–145)

## 2022-12-06 LAB — GLUCOSE, CAPILLARY
Glucose-Capillary: 147 mg/dL — ABNORMAL HIGH (ref 70–99)
Glucose-Capillary: 148 mg/dL — ABNORMAL HIGH (ref 70–99)

## 2022-12-06 LAB — PHOSPHORUS: Phosphorus: 4.5 mg/dL (ref 2.5–4.6)

## 2022-12-06 LAB — MAGNESIUM: Magnesium: 2 mg/dL (ref 1.7–2.4)

## 2022-12-06 MED ORDER — THIAMINE HCL 100 MG PO TABS
100.0000 mg | ORAL_TABLET | Freq: Every day | ORAL | 0 refills | Status: DC
  Filled 2022-12-06: qty 30, 30d supply, fill #0

## 2022-12-06 MED ORDER — CHLORDIAZEPOXIDE HCL 10 MG PO CAPS
ORAL_CAPSULE | ORAL | 0 refills | Status: DC
Start: 2022-12-06 — End: 2022-12-14
  Filled 2022-12-06: qty 30, 12d supply, fill #0

## 2022-12-06 MED ORDER — METFORMIN HCL ER 750 MG PO TB24
1500.0000 mg | ORAL_TABLET | Freq: Every day | ORAL | 0 refills | Status: DC
  Filled 2022-12-06: qty 60, 30d supply, fill #0

## 2022-12-06 MED ORDER — ADULT MULTIVITAMIN W/MINERALS CH
1.0000 | ORAL_TABLET | Freq: Every day | ORAL | 0 refills | Status: AC
  Filled 2022-12-06: qty 130, 130d supply, fill #0

## 2022-12-06 MED ORDER — FOLIC ACID 1 MG PO TABS
1.0000 mg | ORAL_TABLET | Freq: Every day | ORAL | 0 refills | Status: DC
  Filled 2022-12-06: qty 30, 30d supply, fill #0

## 2022-12-06 NOTE — TOC Transition Note (Signed)
Transition of Care [facility]) - CM/SW Discharge Note   Patient Details  Name: Jary Louvier MRN: [id number] Date of Birth: March 08, 1966  [facility] CM/SW Contact:  Gordy Clement, RN Phone Number: 12/06/2022, 10:07 AM   Clinical Narrative:     Patient will DC today to home with Daughter. Outpatient PT has been recommended and referral is made. Family will transport home. Gilmer Mor will be provided by [employer].       Barriers to Discharge: Continued Medical Work up   Patient Goals and CMS Choice      Discharge Placement                         Discharge Plan and Services Additional resources added to the After Visit Summary for                                       Social Determinants of Health (SDOH) Interventions SDOH Screenings   Food Insecurity: No Food Insecurity (12/02/2022)  Housing: Low Risk  (12/02/2022)  Transportation Needs: No Transportation Needs (12/02/2022)  Utilities: Not At Risk (12/02/2022)  Depression (PHQ2-9): Low Risk  (10/01/2022)  Tobacco Use: Medium Risk (09/29/2022)     Readmission Risk Interventions     No data to display

## 2022-12-06 NOTE — Discharge Summary (Addendum)
Physician Discharge Summary  Derek Cruz QMV:[id number] DOB: [birth year] DOA: 12/01/2022  PCP: Rema Fendt, NP  Admit date: 12/01/2022 Discharge date: 12/06/2022  Admitted From: (Home) Disposition:  (Home)  Recommendations for Outpatient Follow-up:  Follow up with PCP in 1-2 weeks Please obtain BMP/CBC in one wee   Diet recommendation: Heart Healthy  Brief/Interim Summary: Derek Cruz is a 57 y.o. male with medical history significant of ETOH dependence, HLD, and HTN presenting with falls in the setting of ETOH use.   He acknowledges drinking about 6 beers daily and said that he has been falling a lot.  He has various MSK pains associated with falls, including back pain.  He desires to stop drinking.  He has h/o possible withdrawal seizures but denies DTs.  He has been to rehab before (?[facility]).  CIWA at the time of my evaluation was 27.patient with known chronic ETOH use, fell yesterday.  Drinking all day.  Still intoxicated on admission, and ETOH withdrawal.  Given Valium and IVF.  And he was admitted for further workup.       ETOH Dependence with withdrawal/DTs -Patient with chronic ETOH dependence -Number of drinks per day: reportedly 6 beers (likely significantly more) -Number of UC/ER visits or admissions for management of alcohol withdrawal syndrome (detox): 6 since 09/23/2022 -History of withdrawal seizures, ICU admissions, DTs: reports seizures -Patient is exhibiting active s/sx of withdrawal, on admission, for his CIWA score was 27.  Patient with significant alcohol withdrawal with DTs despite being on CIWA protocol, and Valium, as CIWA protocol has been changed to stepdown protocol, and Valium has been changed to Librium, over the last 24 hours withdrawals and delirium has resolved, he will be discharged on Librium taper, thiamine and folic acid. -Elevated LFTs are likely related to alcoholism      Falls Likely associated with heavy ETOH  use Imaging with contusions but no overt fractures    HTN Reume home meds   HLD Hold atorvastatin for now   DM poorly controlled, with hyperglycemia A1c of 10.8 Recent A1c was 10.8 - poor control Resume home metformin   Hyponatremia -Likely due to beer consumption, stable, mild   Elevated lipase  Mildly elevated at 110 today, no epigastric pain, no evidence of pancreatitis on imaging upon admission-   Transaminitis -Trending up, mildly, due to alcohol   lactic acidosis -Due to above,treated with IV fluids, initially trending up, required multiple IV boluses , back to normal l  Discharge Diagnoses:  Principal Problem:   Alcohol dependence with withdrawal, uncomplicated (HCC) Active Problems:   Uncontrolled type 2 diabetes mellitus with hyperglycemia, without long-term current use of insulin (HCC)   Hyperlipidemia   Essential hypertension   Frequent falls    Discharge Instructions  Discharge Instructions     Diet - low sodium heart healthy   Complete by: As directed    Discharge instructions   Complete by: As directed    Follow with Primary MD Rema Fendt, NP in 7 days   Get CBC, CMP,  checked  by Primary MD next visit.    Activity: As tolerated with Full fall precautions use walker/cane & assistance as needed   Disposition Home    Diet: Carb modified   On your next visit with your primary care physician please Get Medicines reviewed and adjusted.   Please request your Prim.MD to go over all Hospital Tests and Procedure/Radiological results at the follow up, please get all Hospital records sent to your [facility]  MD by signing hospital release before you go home.   If you experience worsening of your admission symptoms, develop shortness of breath, life threatening emergency, suicidal or homicidal thoughts you must seek medical attention immediately by calling 911 or calling your MD immediately  if symptoms less severe.  You Must read complete  instructions/literature along with all the possible adverse reactions/side effects for all the Medicines you take and that have been prescribed to you. Take any new Medicines after you have completely understood and accpet all the possible adverse reactions/side effects.   Do not drive, operating heavy machinery, perform activities at heights, swimming or participation in water activities or provide baby sitting services if your were admitted for syncope or siezures until you have seen by Primary MD or a Neurologist and advised to do so again.  Do not drive when taking Pain medications.    Do not take more than prescribed Pain, Sleep and Anxiety Medications  Special Instructions: If you have smoked or chewed Tobacco  in the last 2 yrs please stop smoking, stop any regular Alcohol  and or any Recreational drug use.  Wear Seat belts while driving.   Please note  You were cared for by a hospitalist during your hospital stay. If you have any questions about your discharge medications or the care you received while you were in the hospital after you are discharged, you can call the unit and asked to speak with the hospitalist on call if the hospitalist that took care of you is not available. Once you are discharged, your primary care physician will handle any further medical issues. Please note that NO REFILLS for any discharge medications will be authorized once you are discharged, as it is imperative that you return to your primary care physician (or establish a relationship with a primary care physician if you do not have one) for your aftercare needs so that they can reassess your need for medications and monitor your lab values.   Increase activity slowly   Complete by: As directed       Allergies as of 12/06/2022   No Known Allergies      Medication List     STOP taking these medications    atorvastatin 40 MG tablet Commonly known as: LIPITOR   lisinopril 20 MG tablet Commonly  known as: ZESTRIL       TAKE these medications    CertaVite/Antioxidants Tabs Tome 1 tableta por va oral diariamente. (Take 1 tablet by mouth daily.)   chlordiazePOXIDE 10 MG capsule Commonly known as: LIBRIUM Take 1 capsule (10 mg total) by mouth 4 (four) times daily for 3 days, THEN 1 capsule (10 mg total) 3 (three) times daily for 3 days, THEN 1 capsule (10 mg total) 2 (two) times daily for 3 days, THEN 1 capsule (10 mg total) daily for 3 days. Start taking on: December 06, 2022   folic acid 1 MG tablet Commonly known as: FOLVITE Tome 1 tableta (1 mg en total) por va oral diariamente. (Take 1 tablet (1 mg total) by mouth daily.)   metFORMIN 750 MG 24 hr tablet Commonly known as: GLUCOPHAGE-XR Take 2 tablets (1,500 mg total) by mouth daily with breakfast.   thiamine 100 MG tablet Commonly known as: VITAMIN B1 Tome 1 tableta (100 mg en total) por va oral diariamente. (Take 1 tablet (100 mg total) by mouth daily.) Start taking on: December 10, 2022  Durable Medical Equipment  (From admission, onward)           Start     Ordered   12/06/22 1004  For home use only DME Cane  Once        12/06/22 1004            No Known Allergies  Consultations: none   Procedures/Studies: DG Elbow 2 Views Left  Result Date: 12/02/2022 CLINICAL DATA:  Left elbow pain EXAM: LEFT ELBOW - 2 VIEW COMPARISON:  None Available. FINDINGS: There is no evidence of fracture, dislocation, or joint effusion. Small olecranon spur is present. There is posterior elbow soft tissue swelling. Soft tissues are unremarkable. Antecubital IV is present. IMPRESSION: 1. No acute fracture or dislocation. 2. Posterior elbow soft tissue swelling. Electronically Signed   By: Darliss Cheney M.D.   On: 12/02/2022 22:02   CT CHEST ABDOMEN PELVIS W CONTRAST  Result Date: 12/01/2022 CLINICAL DATA:  57 year old male status post multiple falls in the past 2 days. Pain. EXAM: CT CHEST, ABDOMEN, AND  PELVIS WITH CONTRAST TECHNIQUE: Multidetector CT imaging of the chest, abdomen and pelvis was performed following the standard protocol during bolus administration of intravenous contrast. RADIATION DOSE REDUCTION: This exam was performed according to the departmental dose-optimization program which includes automated exposure control, adjustment of the mA and/or kV according to patient size and/or use of iterative reconstruction technique. CONTRAST:  75mL OMNIPAQUE IOHEXOL 350 MG/ML SOLN COMPARISON:  CT Abdomen and Pelvis 05/07/2020. Cervical spine CT today reported separately. FINDINGS: CT CHEST FINDINGS Cardiovascular: Intact thoracic aorta. No periaortic hematoma. Normal heart size. No pericardial effusion. Other central mediastinal vascular structures appear intact. Mediastinum/Nodes: Negative. No mediastinal hematoma, lymphadenopathy, mass. Lungs/Pleura: Major airways are patent. Largely normal lung volumes and clear lungs. There is minor dependent atelectasis. No pneumothorax, pleural effusion, or pulmonary contusion. Musculoskeletal: Bulky left acromial osteophytosis. But the visible shoulder osseous structures appear intact. No sternal fracture identified. No rib fracture identified. No thoracic vertebral fracture identified. CT ABDOMEN PELVIS FINDINGS Hepatobiliary: Chronic fatty liver disease. No liver or gallbladder injury, perihepatic fluid identified. Pancreas: Negative. Spleen: Diminutive and intact.  No perisplenic fluid. Adrenals/Urinary Tract: Negative; adrenal glands and kidneys appear normal. Distended but otherwise unremarkable urinary bladder. Occasional pelvic phleboliths. Stomach/Bowel: No dilated large or small bowel. Redundant sigmoid colon. Mild retained stool. Normal appendix series 4, image 92. Small volume retained fluid in the stomach. Duodenum appears negative. No free air or free fluid identified. Vascular/Lymphatic: Major arterial structures in the abdomen and pelvis appear patent  and normal. No lymphadenopathy identified. Portal venous system appears to be patent. Reproductive: Negative. Other: No pelvis free fluid. Musculoskeletal: Chronic lumbar spine disc and endplate degeneration. Occasional vacuum disc. Mild chronic retrolisthesis of L1 on L2. Lower lumbar facet degeneration. Lumbar vertebrae, sacrum, SI joints, pelvis, and proximal femurs appear intact. Mild posterior left flank subcutaneous contusion series 4, image 84. No other superficial soft tissue injury identified. IMPRESSION: 1. Mild posterior left flank subcutaneous contusion. 2. No other acute traumatic injury identified in the chest, abdomen, or pelvis. 3. Chronic fatty liver disease. Electronically Signed   By: Odessa Fleming M.D.   On: 12/01/2022 08:11   CT Cervical Spine Wo Contrast  Result Date: 12/01/2022 CLINICAL DATA:  57 year old male status post multiple falls in the past 2 days. Pain. EXAM: CT CERVICAL SPINE WITHOUT CONTRAST TECHNIQUE: Multidetector CT imaging of the cervical spine was performed without intravenous contrast. Multiplanar CT image reconstructions were also generated. RADIATION DOSE REDUCTION: This  exam was performed according to the departmental dose-optimization program which includes automated exposure control, adjustment of the mA and/or kV according to patient size and/or use of iterative reconstruction technique. COMPARISON:  CT head and face today reported separately. FINDINGS: Alignment: Straightening of cervical lordosis. Cervicothoracic junction alignment is within normal limits. Bilateral posterior element alignment is within normal limits. Skull base and vertebrae: Bone mineralization is within normal limits. Visualized skull base is intact. No atlanto-occipital dissociation. C1 and C2 appear intact and aligned. Motion artifact most pronounced at C4 and C5. No acute osseous abnormality identified. Soft tissues and spinal canal: No prevertebral fluid or swelling. No visible canal hematoma.  Larynx and pharynx motion artifact. Disc levels: Widespread cervical disc and endplate degeneration. 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 Upper chest: Chest CT today reported separately. IMPRESSION: 1. Mildly motion degraded. No acute traumatic injury identified in the cervical spine. 2. Widespread cervical disc and endplate degeneration.  Early OPLL. Electronically Signed   By: Odessa Fleming M.D.   On: 12/01/2022 08:04   CT Maxillofacial WO CM  Result Date: 12/01/2022 CLINICAL DATA:  57 year old male status post multiple falls in the past 2 days. Pain. EXAM: CT MAXILLOFACIAL WITHOUT CONTRAST TECHNIQUE: Multidetector CT imaging of the maxillofacial structures was performed. Multiplanar CT image reconstructions were also generated. RADIATION DOSE REDUCTION: This exam was performed according to the departmental dose-optimization program which includes automated exposure control, adjustment of the mA and/or kV according to patient size and/or use of iterative reconstruction technique. COMPARISON:  CT head and cervical spine today reported separately. FINDINGS: Osseous: Mandible intact and normally located. No acute dental finding identified. Bilateral maxilla, zygoma, pterygoid, and nasal bones appear intact. Central skull base intact. Cervical spine detailed separately. Orbits: Chronic lamina papyracea fracture on the left. No acute orbital wall fracture. Globes and intraorbital soft tissues appear normal. Sinuses: Well aerated throughout. Tympanic cavities and mastoids appear clear. Soft tissues: Negative visible noncontrast deep soft tissue spaces of the face. No superficial soft tissue injury is identified. Limited intracranial: Reported separately today. IMPRESSION: 1.  No acute traumatic injury identified in the Face. 2. Chronic left lamina papyracea fracture. Electronically Signed   By: Odessa Fleming M.D.   On: 12/01/2022 08:01   DG Chest Port 1 View  Result Date:  12/01/2022 CLINICAL DATA:  58 year old male status post multiple falls in the past 2 days. Pain. EXAM: PORTABLE CHEST 1 VIEW COMPARISON:  Chest radiographs 09/23/2022 and earlier. FINDINGS: Portable AP upright view at 0720 hours. Lung volumes are within normal limits today. Normal cardiac size and mediastinal contours. Visualized tracheal air column is within normal limits. Allowing for portable technique the lungs are clear. No acute osseous abnormality identified. Negative visible bowel gas. IMPRESSION: Negative portable chest. Electronically Signed   By: Odessa Fleming M.D.   On: 12/01/2022 07:51   CT Head Wo Contrast  Result Date: 12/01/2022 CLINICAL DATA:  57 year old male status post multiple falls in the past 2 days. Pain. EXAM: CT HEAD WITHOUT CONTRAST TECHNIQUE: Contiguous axial images were obtained from the base of the skull through the vertex without intravenous contrast. RADIATION DOSE REDUCTION: This exam was performed according to the departmental dose-optimization program which includes automated exposure control, adjustment of the mA and/or kV according to patient size and/or use of iterative reconstruction technique. COMPARISON:  Head CT and brain MRI 09/29/2022. FINDINGS: Brain: Partially empty sella redemonstrated. Background brain volume within normal limits for age. No midline shift, ventriculomegaly,  mass effect, evidence of mass lesion, intracranial hemorrhage or evidence of cortically based acute infarction. Gray-white matter differentiation is within normal limits throughout the brain. Vascular: No suspicious intracranial vascular hyperdensity. Mild Calcified atherosclerosis at the skull base. Skull: Chronic left lamina papyracea fracture. No acute osseous abnormality identified. Sinuses/Orbits: Visualized paranasal sinuses and mastoids are stable and well aerated. Other: Broad-based posterior vertex and left convexity scalp hematoma (series 4, image 72). And there is trace associated scalp  soft tissue gas. Underlying calvarium appears intact. Orbits soft tissues appears stable. IMPRESSION: 1. Posterior scalp hematoma, laceration with no underlying skull fracture. 2. Stable and negative for age noncontrast CT appearance of the brain. Electronically Signed   By: Odessa Fleming M.D.   On: 12/01/2022 07:50      Subjective:  No Significant events overnight, good appetite, eager to go home today. Discharge Exam: Vitals:   12/06/22 0759 12/06/22 1000  BP: 127/88 137/86  Pulse: 76 93  Resp: 18 17  Temp: 97.8 F (36.6 C)   SpO2:  94%   Vitals:   12/06/22 0048 12/06/22 0500 12/06/22 0759 12/06/22 1000  BP: (!) 129/93  127/88 137/86  Pulse: 93  76 93  Resp: 20  18 17   Temp: (!) 97.3 F (36.3 C)  97.8 F (36.6 C)   TempSrc: Oral Oral Oral   SpO2: 96% 96%  94%    General: Pt is alert, awake, not in acute distress Cardiovascular: RRR, S1/S2 +, no rubs, no gallops Respiratory: CTA bilaterally, no wheezing, no rhonchi Abdominal: Soft, NT, ND, bowel sounds + Extremities: no edema, no cyanosis    The results of significant diagnostics from this hospitalization (including imaging, microbiology, ancillary and laboratory) are listed below for reference.     Microbiology: No results found for this or any previous visit (from the past 240 hour(s)).   Labs: BNP (last 3 results) No results for input(s): "BNP" in the last 8760 hours. Basic Metabolic Panel: Recent Labs  Lab 12/01/22 0700 12/01/22 0705 12/02/22 0312 12/03/22 0532 12/04/22 0234 12/05/22 0546 12/06/22 0453  NA 134*   < > 133* 133* 135 137 136  K 3.2*   < > 3.1* 3.5 3.6 3.8 3.7  CL 96*   < > 101 100 102 104 104  CO2 22  --  24 23 25  21* 23  GLUCOSE 147*   < > 104* 131* 123* 127* 125*  BUN 6   < > 7 6 7 16 11   CREATININE 0.69   < > 0.62 0.57* 0.72 0.74 0.60*  CALCIUM 8.2*  --  8.3* 8.9 9.3 9.1 8.9  MG 2.2  --   --  2.0 1.9 2.1 2.0  PHOS  --   --   --  4.2 4.7* 4.8* 4.5   < > = values in this interval not  displayed.   Liver Function Tests: Recent Labs  Lab 12/01/22 0700 12/02/22 0312 12/03/22 0532  AST 83* 49* 86*  ALT 43 32 62*  ALKPHOS 99 88 119  BILITOT 0.9 1.2 0.8  PROT 7.3 6.0* 6.8  ALBUMIN 3.9 3.1* 3.3*   Recent Labs  Lab 12/01/22 0824 12/03/22 0532  LIPASE 32 110*   No results for input(s): "AMMONIA" in the last 168 hours. CBC: Recent Labs  Lab 12/01/22 0700 12/01/22 0705 12/02/22 0312 12/03/22 0532 12/04/22 0234 12/05/22 0546 12/06/22 0453  WBC 7.9  --  5.7 5.9 6.7 6.9 7.2  NEUTROABS 6.3  --   --   --   --   --   --  HGB 14.6   < > 13.2 14.4 14.7 14.6 14.3  HCT 40.4   < > 36.7* 39.5 41.2 41.0 40.2  MCV 87.8  --  89.7 89.6 88.8 91.5 90.1  PLT 212  --  159 159 183 178 174   < > = values in this interval not displayed.   Cardiac Enzymes: No results for input(s): "CKTOTAL", "CKMB", "CKMBINDEX", "TROPONINI" in the last 168 hours. BNP: Invalid input(s): "POCBNP" CBG: Recent Labs  Lab 12/05/22 1138 12/05/22 1630 12/05/22 2234 12/06/22 0755 12/06/22 1336  GLUCAP 137* 135* 227* 147* 148*   D-Dimer No results for input(s): "DDIMER" in the last 72 hours. Hgb A1c No results for input(s): "HGBA1C" in the last 72 hours. Lipid Profile No results for input(s): "CHOL", "HDL", "LDLCALC", "TRIG", "CHOLHDL", "LDLDIRECT" in the last 72 hours. Thyroid function studies No results for input(s): "TSH", "T4TOTAL", "T3FREE", "THYROIDAB" in the last 72 hours.  Invalid input(s): "FREET3" Anemia work up No results for input(s): "VITAMINB12", "FOLATE", "FERRITIN", "TIBC", "IRON", "RETICCTPCT" in the last 72 hours. Urinalysis    Component Value Date/Time   COLORURINE STRAW (A) 12/01/2022 0835   APPEARANCEUR CLEAR 12/01/2022 0835   LABSPEC 1.028 12/01/2022 0835   PHURINE 6.0 12/01/2022 0835   GLUCOSEU NEGATIVE 12/01/2022 0835   HGBUR NEGATIVE 12/01/2022 0835   BILIRUBINUR NEGATIVE 12/01/2022 0835   BILIRUBINUR neg 01/04/2020 1307   KETONESUR NEGATIVE 12/01/2022  0835   PROTEINUR NEGATIVE 12/01/2022 0835   UROBILINOGEN 0.2 01/04/2020 1307   UROBILINOGEN 0.2 07/29/2017 1537   NITRITE NEGATIVE 12/01/2022 0835   LEUKOCYTESUR NEGATIVE 12/01/2022 0835   Sepsis Labs Recent Labs  Lab 12/03/22 0532 12/04/22 0234 12/05/22 0546 12/06/22 0453  WBC 5.9 6.7 6.9 7.2   Microbiology No results found for this or any previous visit (from the past 240 hour(s)).   Time coordinating discharge: Over 30 minutes  SIGNED:   Huey Bienenstock, MD  [facility] 12/06/2022, 3:56 PM Pager   If 7PM-7AM, please contact night-coverage [email or web address] Password [email or web address]

## 2022-12-07 ENCOUNTER — Encounter (HOSPITAL_COMMUNITY): Payer: Self-pay

## 2022-12-07 ENCOUNTER — Emergency Department (HOSPITAL_COMMUNITY)
Admission: EM | Admit: 2022-12-07 | Discharge: 2022-12-08 | Disposition: A | Discharge status: Home / Self Care | Payer: Self-pay | Attending: Emergency Medicine | Admitting: Emergency Medicine

## 2022-12-07 DIAGNOSIS — E119 Type 2 diabetes mellitus without complications: Secondary | ICD-10-CM | POA: Insufficient documentation

## 2022-12-07 DIAGNOSIS — F10929 Alcohol use, unspecified with intoxication, unspecified: Secondary | ICD-10-CM

## 2022-12-07 DIAGNOSIS — Y900 Blood alcohol level of less than 20 mg/100 ml: Secondary | ICD-10-CM | POA: Insufficient documentation

## 2022-12-07 DIAGNOSIS — F10920 Alcohol use, unspecified with intoxication, uncomplicated: Secondary | ICD-10-CM | POA: Insufficient documentation

## 2022-12-07 DIAGNOSIS — R000 Tachycardia, unspecified: Secondary | ICD-10-CM | POA: Insufficient documentation

## 2022-12-07 DIAGNOSIS — Z7984 Long term (current) use of oral hypoglycemic drugs: Secondary | ICD-10-CM | POA: Insufficient documentation

## 2022-12-07 LAB — COMPREHENSIVE METABOLIC PANEL
ALT: 81 U/L — ABNORMAL HIGH (ref 0–44)
AST: 60 U/L — ABNORMAL HIGH (ref 15–41)
Albumin: 4.1 g/dL (ref 3.5–5.0)
Alkaline Phosphatase: 81 U/L (ref 38–126)
Anion gap: 17 — ABNORMAL HIGH (ref 5–15)
BUN: 8 mg/dL (ref 6–20)
CO2: 21 mmol/L — ABNORMAL LOW (ref 22–32)
Calcium: 9.1 mg/dL (ref 8.9–10.3)
Chloride: 103 mmol/L (ref 98–111)
Creatinine, Ser: 1.8 mg/dL — ABNORMAL HIGH (ref 0.61–1.24)
GFR, Estimated: 43 mL/min — ABNORMAL LOW (ref >60)
Glucose, Bld: 242 mg/dL — ABNORMAL HIGH (ref 70–99)
Potassium: 3.4 mmol/L — ABNORMAL LOW (ref 3.5–5.1)
Sodium: 141 mmol/L (ref 135–145)
Total Bilirubin: 0.6 mg/dL (ref 0.3–1.2)
Total Protein: 7.7 g/dL (ref 6.5–8.1)

## 2022-12-07 LAB — LACTIC ACID, PLASMA: Lactic Acid, Venous: 2.4 mmol/L (ref 0.5–1.9)

## 2022-12-07 LAB — BETA-HYDROXYBUTYRIC ACID: Beta-Hydroxybutyric Acid: 0.1 mmol/L (ref 0.05–0.27)

## 2022-12-07 LAB — SALICYLATE LEVEL: Salicylate Lvl: <7 mg/dL — ABNORMAL LOW (ref 7.0–30.0)

## 2022-12-07 LAB — CBC
HCT: 43 % (ref 39.0–52.0)
Hemoglobin: 15.3 g/dL (ref 13.0–17.0)
MCH: 32.2 pg (ref 26.0–34.0)
MCHC: 35.6 g/dL (ref 30.0–36.0)
MCV: 90.5 fL (ref 80.0–100.0)
Platelets: 213 10*3/uL (ref 150–400)
RBC: 4.75 MIL/uL (ref 4.22–5.81)
RDW: 14.6 % (ref 11.5–15.5)
WBC: 9.7 10*3/uL (ref 4.0–10.5)
nRBC: 0 % (ref 0.0–0.2)

## 2022-12-07 LAB — ACETAMINOPHEN LEVEL: Acetaminophen (Tylenol), Serum: <10 ug/mL — ABNORMAL LOW (ref 10–30)

## 2022-12-07 LAB — ETHANOL: Alcohol, Ethyl (B): <10 mg/dL (ref <10)

## 2022-12-07 MED ORDER — SODIUM CHLORIDE 0.9 % IV BOLUS
1000.0000 mL | Freq: Once | INTRAVENOUS | Status: AC
  Administered 2022-12-07: 1000 mL via INTRAVENOUS

## 2022-12-07 NOTE — ED Triage Notes (Addendum)
Pt is coming in after medic was called because the pt was found on the floor at home, there was empty bottles of Iso-propyl alcohol in his area, pt has an odor of iso-propyl alcohol as well. Medic reports he is a a&O x1 and Gcs 14. Was just recently seen of Alcohol poisoning as well.  Spanish speaking.   Medic Vitals  125/84 123hr 18rr 95% ra  133bgl

## 2022-12-08 ENCOUNTER — Observation Stay (HOSPITAL_COMMUNITY)
Admission: EM | Admit: 2022-12-08 | Discharge: 2022-12-09 | Disposition: A | Discharge status: Home / Self Care | Payer: Self-pay | Attending: Student | Admitting: Student

## 2022-12-08 ENCOUNTER — Inpatient Hospital Stay (HOSPITAL_COMMUNITY): Payer: Self-pay

## 2022-12-08 ENCOUNTER — Ambulatory Visit (HOSPITAL_COMMUNITY)
Admission: EM | Admit: 2022-12-08 | Discharge: 2022-12-08 | Disposition: A | Discharge status: Other Acute Inpatient Hospital | Payer: No Payment, Other | Attending: Psychiatry | Admitting: Psychiatry

## 2022-12-08 ENCOUNTER — Other Ambulatory Visit: Payer: Self-pay

## 2022-12-08 DIAGNOSIS — R410 Disorientation, unspecified: Secondary | ICD-10-CM | POA: Insufficient documentation

## 2022-12-08 DIAGNOSIS — E785 Hyperlipidemia, unspecified: Secondary | ICD-10-CM | POA: Insufficient documentation

## 2022-12-08 DIAGNOSIS — F10239 Alcohol dependence with withdrawal, unspecified: Secondary | ICD-10-CM | POA: Insufficient documentation

## 2022-12-08 DIAGNOSIS — R296 Repeated falls: Secondary | ICD-10-CM

## 2022-12-08 DIAGNOSIS — F10930 Alcohol use, unspecified with withdrawal, uncomplicated: Principal | ICD-10-CM | POA: Insufficient documentation

## 2022-12-08 DIAGNOSIS — Z79899 Other long term (current) drug therapy: Secondary | ICD-10-CM | POA: Insufficient documentation

## 2022-12-08 DIAGNOSIS — F10939 Alcohol use, unspecified with withdrawal, unspecified: Secondary | ICD-10-CM | POA: Diagnosis present

## 2022-12-08 DIAGNOSIS — R519 Headache, unspecified: Secondary | ICD-10-CM | POA: Insufficient documentation

## 2022-12-08 DIAGNOSIS — I10 Essential (primary) hypertension: Secondary | ICD-10-CM | POA: Insufficient documentation

## 2022-12-08 DIAGNOSIS — E1165 Type 2 diabetes mellitus with hyperglycemia: Secondary | ICD-10-CM | POA: Diagnosis present

## 2022-12-08 DIAGNOSIS — N1831 Chronic kidney disease, stage 3a: Secondary | ICD-10-CM

## 2022-12-08 DIAGNOSIS — E876 Hypokalemia: Secondary | ICD-10-CM | POA: Diagnosis present

## 2022-12-08 DIAGNOSIS — R0789 Other chest pain: Secondary | ICD-10-CM | POA: Insufficient documentation

## 2022-12-08 DIAGNOSIS — E119 Type 2 diabetes mellitus without complications: Secondary | ICD-10-CM | POA: Insufficient documentation

## 2022-12-08 DIAGNOSIS — R079 Chest pain, unspecified: Principal | ICD-10-CM | POA: Diagnosis present

## 2022-12-08 DIAGNOSIS — F1010 Alcohol abuse, uncomplicated: Secondary | ICD-10-CM

## 2022-12-08 DIAGNOSIS — N183 Chronic kidney disease, stage 3 unspecified: Secondary | ICD-10-CM | POA: Diagnosis present

## 2022-12-08 DIAGNOSIS — W19XXXA Unspecified fall, initial encounter: Secondary | ICD-10-CM | POA: Insufficient documentation

## 2022-12-08 DIAGNOSIS — R7989 Other specified abnormal findings of blood chemistry: Secondary | ICD-10-CM | POA: Diagnosis present

## 2022-12-08 DIAGNOSIS — I129 Hypertensive chronic kidney disease with stage 1 through stage 4 chronic kidney disease, or unspecified chronic kidney disease: Secondary | ICD-10-CM | POA: Insufficient documentation

## 2022-12-08 DIAGNOSIS — R110 Nausea: Secondary | ICD-10-CM | POA: Insufficient documentation

## 2022-12-08 LAB — BASIC METABOLIC PANEL
Anion gap: 13 (ref 5–15)
BUN: 8 mg/dL (ref 6–20)
CO2: 19 mmol/L — ABNORMAL LOW (ref 22–32)
Calcium: 7.9 mg/dL — ABNORMAL LOW (ref 8.9–10.3)
Chloride: 110 mmol/L (ref 98–111)
Creatinine, Ser: 1.7 mg/dL — ABNORMAL HIGH (ref 0.61–1.24)
GFR, Estimated: 46 mL/min — ABNORMAL LOW (ref >60)
Glucose, Bld: 158 mg/dL — ABNORMAL HIGH (ref 70–99)
Potassium: 3.2 mmol/L — ABNORMAL LOW (ref 3.5–5.1)
Sodium: 142 mmol/L (ref 135–145)

## 2022-12-08 LAB — CBC
HCT: 38.2 % — ABNORMAL LOW (ref 39.0–52.0)
Hemoglobin: 13.4 g/dL (ref 13.0–17.0)
MCH: 32.7 pg (ref 26.0–34.0)
MCHC: 35.1 g/dL (ref 30.0–36.0)
MCV: 93.2 fL (ref 80.0–100.0)
Platelets: 209 10*3/uL (ref 150–400)
RBC: 4.1 MIL/uL — ABNORMAL LOW (ref 4.22–5.81)
RDW: 14.6 % (ref 11.5–15.5)
WBC: 6.6 10*3/uL (ref 4.0–10.5)
nRBC: 0 % (ref 0.0–0.2)

## 2022-12-08 LAB — CBG MONITORING, ED
Glucose-Capillary: 108 mg/dL — ABNORMAL HIGH (ref 70–99)
Glucose-Capillary: 120 mg/dL — ABNORMAL HIGH (ref 70–99)

## 2022-12-08 LAB — HEPATIC FUNCTION PANEL
ALT: 49 U/L — ABNORMAL HIGH (ref 0–44)
AST: 29 U/L (ref 15–41)
Albumin: 3.1 g/dL — ABNORMAL LOW (ref 3.5–5.0)
Alkaline Phosphatase: 58 U/L (ref 38–126)
Bilirubin, Direct: <0.1 mg/dL (ref 0.0–0.2)
Total Bilirubin: 0.5 mg/dL (ref 0.3–1.2)
Total Protein: 6 g/dL — ABNORMAL LOW (ref 6.5–8.1)

## 2022-12-08 LAB — MAGNESIUM
Magnesium: 2.5 mg/dL — ABNORMAL HIGH (ref 1.7–2.4)
Magnesium: 2.1 mg/dL (ref 1.7–2.4)

## 2022-12-08 LAB — COMPREHENSIVE METABOLIC PANEL
ALT: 61 U/L — ABNORMAL HIGH (ref 0–44)
AST: 40 U/L (ref 15–41)
Albumin: 3.8 g/dL (ref 3.5–5.0)
Alkaline Phosphatase: 75 U/L (ref 38–126)
Anion gap: 13 (ref 5–15)
BUN: <5 mg/dL — ABNORMAL LOW (ref 6–20)
CO2: 19 mmol/L — ABNORMAL LOW (ref 22–32)
Calcium: 8.6 mg/dL — ABNORMAL LOW (ref 8.9–10.3)
Chloride: 108 mmol/L (ref 98–111)
Creatinine, Ser: 1.22 mg/dL (ref 0.61–1.24)
GFR, Estimated: >60 mL/min (ref >60)
Glucose, Bld: 147 mg/dL — ABNORMAL HIGH (ref 70–99)
Potassium: 3.2 mmol/L — ABNORMAL LOW (ref 3.5–5.1)
Sodium: 140 mmol/L (ref 135–145)
Total Bilirubin: 0.5 mg/dL (ref 0.3–1.2)
Total Protein: 7.2 g/dL (ref 6.5–8.1)

## 2022-12-08 LAB — BASIC METABOLIC PANEL WITH GFR
Anion gap: 7 (ref 5–15)
BUN: <5 mg/dL — ABNORMAL LOW (ref 6–20)
CO2: 23 mmol/L (ref 22–32)
Calcium: 8.3 mg/dL — ABNORMAL LOW (ref 8.9–10.3)
Chloride: 108 mmol/L (ref 98–111)
Creatinine, Ser: 1.13 mg/dL (ref 0.61–1.24)
GFR, Estimated: >60 mL/min
Glucose, Bld: 125 mg/dL — ABNORMAL HIGH (ref 70–99)
Potassium: 3.4 mmol/L — ABNORMAL LOW (ref 3.5–5.1)
Sodium: 138 mmol/L (ref 135–145)

## 2022-12-08 LAB — ETHANOL: Alcohol, Ethyl (B): 48 mg/dL — ABNORMAL HIGH (ref <10)

## 2022-12-08 LAB — BETA-HYDROXYBUTYRIC ACID: Beta-Hydroxybutyric Acid: 0.1 mmol/L (ref 0.05–0.27)

## 2022-12-08 LAB — PROTIME-INR
INR: 1.2 (ref 0.8–1.2)
Prothrombin Time: 15 seconds (ref 11.4–15.2)

## 2022-12-08 LAB — CREATININE, URINE, RANDOM: Creatinine, Urine: 59 mg/dL

## 2022-12-08 LAB — OSMOLALITY
Osmolality: 345 mOsm/kg (ref 275–295)
Osmolality: 320 mOsm/kg — ABNORMAL HIGH (ref 275–295)

## 2022-12-08 LAB — AMMONIA: Ammonia: 16 umol/L (ref 9–35)

## 2022-12-08 LAB — OSMOLALITY, URINE: Osmolality, Ur: 442 mOsm/kg (ref 300–900)

## 2022-12-08 LAB — RAPID URINE DRUG SCREEN, HOSP PERFORMED
Amphetamines: NOT DETECTED
Barbiturates: NOT DETECTED
Benzodiazepines: POSITIVE — AB
Cocaine: NOT DETECTED
Opiates: NOT DETECTED
Tetrahydrocannabinol: NOT DETECTED

## 2022-12-08 LAB — SODIUM, URINE, RANDOM: Sodium, Ur: 123 mmol/L

## 2022-12-08 LAB — PHOSPHORUS: Phosphorus: 3.1 mg/dL (ref 2.5–4.6)

## 2022-12-08 LAB — LACTIC ACID, PLASMA: Lactic Acid, Venous: 2 mmol/L (ref 0.5–1.9)

## 2022-12-08 LAB — TSH: TSH: 0.172 u[IU]/mL — ABNORMAL LOW (ref 0.350–4.500)

## 2022-12-08 LAB — CK: Total CK: 238 U/L (ref 49–397)

## 2022-12-08 LAB — LIPASE, BLOOD: Lipase: 28 U/L (ref 11–51)

## 2022-12-08 LAB — TROPONIN I (HIGH SENSITIVITY)
Troponin I (High Sensitivity): 6 ng/L (ref <18)
Troponin I (High Sensitivity): 6 ng/L (ref <18)

## 2022-12-08 MED ORDER — LORAZEPAM 2 MG/ML IJ SOLN: 1.0000 mg | INTRAMUSCULAR | Status: DC | PRN

## 2022-12-08 MED ORDER — THIAMINE HCL 100 MG/ML IJ SOLN
100.0000 mg | Freq: Every day | INTRAMUSCULAR | Status: DC
  Filled 2022-12-08: qty 2

## 2022-12-08 MED ORDER — ONDANSETRON HCL 4 MG/2ML IJ SOLN
4.0000 mg | Freq: Once | INTRAMUSCULAR | Status: AC
  Administered 2022-12-08: 4 mg via INTRAVENOUS
  Filled 2022-12-08: qty 2

## 2022-12-08 MED ORDER — ONDANSETRON HCL 4 MG PO TABS: 4.0000 mg | ORAL_TABLET | Freq: Four times a day (QID) | ORAL | Status: DC | PRN

## 2022-12-08 MED ORDER — HYDROCODONE-ACETAMINOPHEN 5-325 MG PO TABS: 1.0000 | ORAL_TABLET | ORAL | Status: DC | PRN

## 2022-12-08 MED ORDER — PHENOBARBITAL SODIUM 65 MG/ML IJ SOLN
65.0000 mg | Freq: Three times a day (TID) | INTRAMUSCULAR | Status: DC
Start: 2022-12-10 — End: 2022-12-08

## 2022-12-08 MED ORDER — POTASSIUM CHLORIDE CRYS ER 20 MEQ PO TBCR
40.0000 meq | EXTENDED_RELEASE_TABLET | Freq: Once | ORAL | Status: AC
  Administered 2022-12-08: 40 meq via ORAL
  Filled 2022-12-08: qty 2

## 2022-12-08 MED ORDER — LORAZEPAM 1 MG PO TABS
1.0000 mg | ORAL_TABLET | ORAL | Status: DC | PRN
  Filled 2022-12-08: qty 2
  Filled 2022-12-08: qty 1

## 2022-12-08 MED ORDER — DIAZEPAM 5 MG/ML IJ SOLN
10.0000 mg | Freq: Once | INTRAMUSCULAR | Status: AC
  Administered 2022-12-08: 10 mg via INTRAVENOUS
  Filled 2022-12-08: qty 2

## 2022-12-08 MED ORDER — SODIUM CHLORIDE 0.9 % IV BOLUS
1000.0000 mL | Freq: Once | INTRAVENOUS | Status: AC
  Administered 2022-12-08: 1000 mL via INTRAVENOUS

## 2022-12-08 MED ORDER — THIAMINE MONONITRATE 100 MG PO TABS
100.0000 mg | ORAL_TABLET | Freq: Every day | ORAL | Status: DC
  Administered 2022-12-08 – 2022-12-09 (×2): 100 mg via ORAL
  Filled 2022-12-08 (×2): qty 1

## 2022-12-08 MED ORDER — INSULIN ASPART 100 UNIT/ML IJ SOLN
0.0000 [IU] | INTRAMUSCULAR | Status: DC
  Administered 2022-12-09: 1 [IU] via SUBCUTANEOUS

## 2022-12-08 MED ORDER — PHENOBARBITAL SODIUM 130 MG/ML IJ SOLN
97.5000 mg | Freq: Three times a day (TID) | INTRAMUSCULAR | Status: DC
Start: 2022-12-08 — End: 2022-12-08

## 2022-12-08 MED ORDER — ONDANSETRON HCL 4 MG/2ML IJ SOLN
4.0000 mg | Freq: Four times a day (QID) | INTRAMUSCULAR | Status: DC | PRN
  Administered 2022-12-08: 4 mg via INTRAVENOUS
  Filled 2022-12-08: qty 2

## 2022-12-08 MED ORDER — LORAZEPAM 1 MG PO TABS
1.0000 mg | ORAL_TABLET | ORAL | Status: DC | PRN
  Administered 2022-12-08 – 2022-12-09 (×2): 2 mg via ORAL
  Filled 2022-12-08: qty 1

## 2022-12-08 MED ORDER — LACTATED RINGERS IV BOLUS
1000.0000 mL | Freq: Once | INTRAVENOUS | Status: AC
  Administered 2022-12-08: 1000 mL via INTRAVENOUS

## 2022-12-08 MED ORDER — SODIUM CHLORIDE 0.9 % IV SOLN
260.0000 mg | Freq: Once | INTRAVENOUS | Status: AC
  Administered 2022-12-08: 260 mg via INTRAVENOUS
  Filled 2022-12-08: qty 2

## 2022-12-08 MED ORDER — FOLIC ACID 1 MG PO TABS
1.0000 mg | ORAL_TABLET | Freq: Every day | ORAL | Status: DC
  Administered 2022-12-08 – 2022-12-09 (×2): 1 mg via ORAL
  Filled 2022-12-08 (×2): qty 1

## 2022-12-08 MED ORDER — ADULT MULTIVITAMIN W/MINERALS CH
1.0000 | ORAL_TABLET | Freq: Every day | ORAL | Status: DC
  Administered 2022-12-08 – 2022-12-09 (×2): 1 via ORAL
  Filled 2022-12-08 (×2): qty 1

## 2022-12-08 MED ORDER — ACETAMINOPHEN 325 MG PO TABS
650.0000 mg | ORAL_TABLET | Freq: Four times a day (QID) | ORAL | Status: DC | PRN
  Filled 2022-12-08: qty 2

## 2022-12-08 MED ORDER — PHENOBARBITAL SODIUM 65 MG/ML IJ SOLN
32.5000 mg | Freq: Three times a day (TID) | INTRAMUSCULAR | Status: DC
Start: 2022-12-12 — End: 2022-12-08

## 2022-12-08 MED ORDER — SODIUM CHLORIDE 0.9 % IV SOLN: INTRAVENOUS | Status: AC

## 2022-12-08 MED ORDER — ACETAMINOPHEN 650 MG RE SUPP: 650.0000 mg | Freq: Four times a day (QID) | RECTAL | Status: DC | PRN

## 2022-12-08 MED ORDER — THIAMINE HCL 100 MG/ML IJ SOLN
500.0000 mg | Freq: Once | INTRAVENOUS | Status: AC
  Administered 2022-12-08: 500 mg via INTRAVENOUS
  Filled 2022-12-08: qty 5

## 2022-12-08 NOTE — ED Notes (Signed)
RN spoke with Patty from poison control to follow up with labs and updated EKG.

## 2022-12-08 NOTE — Assessment & Plan Note (Signed)
Allow permissive htn for tonight 

## 2022-12-08 NOTE — Assessment & Plan Note (Signed)
-  chronic avoid nephrotoxic medications such as NSAIDs, Vanco Zosyn combo,  avoid hypotension, continue to follow renal function  

## 2022-12-08 NOTE — ED Notes (Signed)
RN spoke with Duwayne Heck RN from poison control. Updated VS and lab results provided. Poison control RN states elevated serum osmolality is expected and recommends ciwa protocol. Provider notified.

## 2022-12-08 NOTE — ED Notes (Signed)
Pt denies any thoughts of wanting to harm himself and reports iso-propyl alcohol ingestion is r/t to desire to drink alcohol.

## 2022-12-08 NOTE — Assessment & Plan Note (Signed)
CIWA protocol ordered Transitional care coordinator for history of alcohol abuse. May benefit from rehab placement

## 2022-12-08 NOTE — Assessment & Plan Note (Signed)
PT ot Eval prior to dc

## 2022-12-08 NOTE — ED Provider Notes (Signed)
[place] EMERGENCY DEPARTMENT AT [facility] Provider Note   CSN: [id number] Arrival date & time: 12/08/22  1420     History  Chief Complaint  Patient presents with   Chest Pain    Pt coming in via EMS with chest pain. Pt was at [facility] when he complained of chest pain. Pt said he had 2 beers when he was discharged from here this AM. Pt is tremulous. No other complaints.     Derek Cruz is a 57 y.o. male.  56 year old male with a history of hypertension, hyperlipidemia, diabetes, and alcohol abuse who presents to the emergency department with chest discomfort.  Patient was seen yesterday after ingesting isopropyl alcohol.  Was observed in the emergency department was cleared.  Declined Librium at that time.  Went to [facility] today and was referred to the emergency department because he was complaining of left-sided chest discomfort.  Says that it started just prior to arrival.  Not pleuritic or exertional.  Also having some dizziness and sweating.  Says that the discomfort is like a pounding sensation in his chest.  No vomiting.  Is having some tremors that are concerning to him for alcohol withdrawals.  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  Based on prior notes typically drinks at least 6 beers per day.  No personal or family history of cardiac disease.       Home Medications Prior to Admission medications   Medication Sig Start Date End Date Taking? Authorizing Provider  chlordiazePOXIDE (LIBRIUM) 10 MG capsule Take 1 capsule (10 mg total) by mouth 4 (four) times daily for 3 days, THEN 1 capsule (10 mg total) 3 (three) times daily for 3 days, THEN 1 capsule (10 mg total) 2 (two) times daily for 3 days, THEN 1 capsule (10 mg total) daily for 3 days. 12/06/22 12/18/22  Elgergawy, Leana Roe, MD  folic acid (FOLVITE) 1 MG tablet Take 1 tablet (1 mg total) by mouth daily. 12/06/22   Elgergawy, Leana Roe, MD  metFORMIN  (GLUCOPHAGE-XR) 750 MG 24 hr tablet Take 2 tablets (1,500 mg total) by mouth daily with breakfast. 12/06/22 01/05/23  Elgergawy, Leana Roe, MD  Multiple Vitamin (MULTIVITAMIN WITH MINERALS) TABS tablet Take 1 tablet by mouth daily. 12/06/22   Elgergawy, Leana Roe, MD  thiamine (VITAMIN B1) 100 MG tablet Take 1 tablet (100 mg total) by mouth daily. 12/10/22   Elgergawy, Leana Roe, MD      Allergies    Patient has no known allergies.    Review of Systems   Review of Systems  Physical Exam Updated Vital Signs BP 124/86   Pulse 97   Temp 98.2 F (36.8 C) (Oral)   Resp 20   Ht 5\' 8"  (1.727 m)   Wt 76.2 kg   SpO2 93%   BMI 25.54 kg/m  Physical Exam Vitals and nursing note reviewed.  Constitutional:      General: He is not in acute distress.    Appearance: He is well-developed.  HENT:     Head: Normocephalic and atraumatic.     Right Ear: External ear normal.     Left Ear: External ear normal.     Nose: Nose normal.  Eyes:     Extraocular Movements: Extraocular movements intact.     Conjunctiva/sclera: Conjunctivae normal.     Pupils: Pupils are equal, round, and reactive to light.  Cardiovascular:     Rate and Rhythm: Regular rhythm.  Tachycardia present.     Heart sounds: Normal heart sounds.     Comments: Chest pain reproducible Pulmonary:     Effort: Pulmonary effort is normal. No respiratory distress.     Breath sounds: Normal breath sounds.  Abdominal:     General: There is no distension.     Palpations: Abdomen is soft. There is no mass.     Tenderness: There is no abdominal tenderness. There is no guarding.  Musculoskeletal:     Cervical back: Normal range of motion and neck supple.     Right lower leg: No edema.     Left lower leg: No edema.  Skin:    General: Skin is warm and dry.  Neurological:     Mental Status: He is alert. Mental status is at baseline.  Psychiatric:        Mood and Affect: Mood normal.        Behavior: Behavior normal.     ED Results /  Procedures / Treatments   Labs (all labs ordered are listed, but only abnormal results are displayed) Labs Reviewed  CBC - Abnormal; Notable for the following components:      Result Value   RBC 4.10 (*)    HCT 38.2 (*)    All other components within normal limits  COMPREHENSIVE METABOLIC PANEL - Abnormal; Notable for the following components:   Potassium 3.2 (*)    CO2 19 (*)    Glucose, Bld 147 (*)    BUN <5 (*)    Calcium 8.6 (*)    ALT 61 (*)    All other components within normal limits  ETHANOL - Abnormal; Notable for the following components:   Alcohol, Ethyl (B) 48 (*)    All other components within normal limits  HEPATIC FUNCTION PANEL - Abnormal; Notable for the following components:   Total Protein 6.0 (*)    Albumin 3.1 (*)    ALT 49 (*)    All other components within normal limits  TSH - Abnormal; Notable for the following components:   TSH 0.172 (*)    All other components within normal limits  BASIC METABOLIC PANEL - Abnormal; Notable for the following components:   Potassium 3.4 (*)    Glucose, Bld 125 (*)    BUN <5 (*)    Calcium 8.3 (*)    All other components within normal limits  CBG MONITORING, ED - Abnormal; Notable for the following components:   Glucose-Capillary 120 (*)    All other components within normal limits  LIPASE, BLOOD  CK  MAGNESIUM  PHOSPHORUS  PROTIME-INR  AMMONIA  OSMOLALITY, URINE  CREATININE, URINE, RANDOM  SODIUM, URINE, RANDOM  BETA-HYDROXYBUTYRIC ACID  PREALBUMIN  OSMOLALITY  MAGNESIUM  PHOSPHORUS  COMPREHENSIVE METABOLIC PANEL  CBC  D-DIMER, QUANTITATIVE  LIPID PANEL  LIPOPROTEIN A (LPA)  T3  T4, FREE  TROPONIN I (HIGH SENSITIVITY)  TROPONIN I (HIGH SENSITIVITY)    EKG EKG Interpretation Date/Time:  Saturday December 08 2022 14:50:35 EDT Ventricular Rate:  105 PR Interval:    QRS Duration:  98 QT Interval:  350 QTC Calculation: 463 R Axis:   -53  Text Interpretation: Normal sinus rhythm Left anterior  fascicular block RSR' in V1 or V2, probably normal variant Reconfirmed by Vonita Moss [phone]) on 12/08/2022 7:13:29 PM  Radiology DG CHEST PORT 1 VIEW  Result Date: 12/08/2022 CLINICAL DATA:  Chest pain. EXAM: PORTABLE CHEST 1 VIEW COMPARISON:  12/01/2022. FINDINGS: The heart size and mediastinal contours  are within normal limits. Lung volumes are low resulting in vascular crowding. No consolidation, effusion, or pneumothorax. No acute osseous abnormality. IMPRESSION: No active disease. Electronically Signed   By: Thornell Sartorius M.D.   On: 12/08/2022 20:50    Procedures Procedures    Medications Ordered in ED Medications  LORazepam (ATIVAN) tablet 1-4 mg (has no administration in time range)  thiamine (VITAMIN B1) tablet 100 mg (100 mg Oral Given 12/08/22 1825)    Or  thiamine (VITAMIN B1) injection 100 mg ( Intravenous See Alternative 12/08/22 1825)  folic acid (FOLVITE) tablet 1 mg (1 mg Oral Given 12/08/22 1825)  multivitamin with minerals tablet 1 tablet (1 tablet Oral Given 12/08/22 1825)  insulin aspart (novoLOG) injection 0-9 Units ( Subcutaneous Not Given 12/08/22 2023)  0.9 %  sodium chloride infusion ( Intravenous New Bag/Given 12/08/22 2030)  acetaminophen (TYLENOL) tablet 650 mg (has no administration in time range)    Or  acetaminophen (TYLENOL) suppository 650 mg (has no administration in time range)  HYDROcodone-acetaminophen (NORCO/VICODIN) 5-325 MG per tablet 1-2 tablet (has no administration in time range)  ondansetron (ZOFRAN) tablet 4 mg (has no administration in time range)    Or  ondansetron (ZOFRAN) injection 4 mg (has no administration in time range)  LORazepam (ATIVAN) tablet 1-4 mg (has no administration in time range)    Or  LORazepam (ATIVAN) injection 1-4 mg (has no administration in time range)  PHENObarbital (LUMINAL) 260 mg in sodium chloride 0.9 % 100 mL IVPB (0 mg Intravenous Stopped 12/08/22 1549)  ondansetron (ZOFRAN) injection 4 mg (4 mg Intravenous  Given 12/08/22 1515)  lactated ringers bolus 1,000 mL (0 mLs Intravenous Stopped 12/08/22 1707)  potassium chloride SA (KLOR-CON M) CR tablet 40 mEq (40 mEq Oral Given 12/08/22 1615)  diazepam (VALIUM) injection 10 mg (10 mg Intravenous Given 12/08/22 1704)  lactated ringers bolus 1,000 mL (0 mLs Intravenous Stopped 12/08/22 1952)  thiamine (VITAMIN B1) 500 mg in sodium chloride 0.9 % 50 mL IVPB (500 mg Intravenous New Bag/Given 12/08/22 2033)    ED Course/ Medical Decision Making/ A&P                             Medical Decision Making Amount and/or Complexity of Data Reviewed Labs: ordered.  Risk Prescription drug management. Decision regarding hospitalization.   Aydrien Massai Hankerson is a 57 y.o. male with comorbidities that complicate the patient evaluation including hypertension, hyperlipidemia, diabetes, and alcohol abuse who presents to the emergency department with chest discomfort, tremors, and concerns for alcohol withdrawal  Initial Ddx:  MI, PE, pancreatitis, gastritis, alcohol withdrawal  MDM/Course:  Patient presents emergency department from behavioral health urgent care with initial presentation for alcohol withdrawal who also is reporting some chest discomfort at this time.  Is tremulous, anxious appearing, and mildly tachycardic on exam.  Discussed the patient with pharmacy and was given a dose of phenobarbital and then Valium for his alcohol withdrawal since he has been admitted to the ICU for delirium tremens in the past.  Had EKG and serial troponins that were unremarkable.  Chest x-ray did not reveal any acute abnormalities.  Upon re-evaluation patient's tremors had much improved.  Did consider pulmonary embolism for the patient but is not complaining of any pleuritic chest pain, shortness of breath, and is not hypoxic.  Discussed with medicine and admitted to stepdown unit.  This patient presents to the ED for concern of complaints listed in HPI,  this involves an  extensive number of treatment options, and is a complaint that carries with it a high risk of complications and morbidity. Disposition including potential need for admission considered.   Dispo: Admit to Step Down  Additional history obtained from EMS Records reviewed Outpatient Clinic Notes The following labs were independently interpreted: Chemistry and show no acute abnormality I independently reviewed the following imaging with scope of interpretation limited to determining acute life threatening conditions related to emergency care: Chest x-ray and agree with the radiologist interpretation with the following exceptions: none I personally reviewed and interpreted cardiac monitoring: normal sinus rhythm  and sinus tachycardia I personally reviewed and interpreted the pt's EKG: see above for interpretation  I have reviewed the patients home medications and made adjustments as needed Consults: Hospitalist Social Determinants of health:  Alcohol abuse       Final Clinical Impression(s) / ED Diagnoses Final diagnoses:  Chest pain, unspecified type  Alcohol withdrawal syndrome without complication (HCC)  Alcohol abuse    Rx / DC Orders ED Discharge Orders     None         Rondel Baton, MD 12/08/22 2108

## 2022-12-08 NOTE — Assessment & Plan Note (Signed)
Unclear etiology obtain chest x-ray troponin unremarkable obtain echogram D-dimer Suspect most likely musculoskeletal

## 2022-12-08 NOTE — ED Notes (Signed)
Dicharge to [place] via ems for chest pain. Report called to [place] charge rn

## 2022-12-08 NOTE — ED Notes (Signed)
RN attempted to assist pt with ambulation, pt states "I can't, I feel weak" and complains of nausea. Provider notified.

## 2022-12-08 NOTE — ED Notes (Signed)
RN spoke with Patty from poison control. Recommended CMP, Mg, lactic, acetaminophen, aspirin, and beta-hydroxybutyric acid labs. Initial and repeat EKG in 4-6 hrs. Fluids and antiemetics for nausea as needed. Provider notified.

## 2022-12-08 NOTE — Assessment & Plan Note (Addendum)
With a sliding scale patient has not had much p.o. intake for the past 24 hours appears ketotic Check beta hydroxybutyrate acid Check UA Check magnesium and phosphate Nutritional consult Poorly controlled DM will have diabetic care coordinator consult

## 2022-12-08 NOTE — BH Assessment (Signed)
Comprehensive Clinical Assessment (CCA) Note  12/08/2022 Derek Cruz [id number]  DISPOSITION: Per Vernard Gambles NP pt is recommended for inpatient detox from alcohol withdrawal and transfer to the ED due to the severity of sx.   The patient demonstrates the following risk factors for suicide: Chronic risk factors for suicide include: substance use disorder. Acute risk factors for suicide include: N/A. Protective factors for this patient include: hope for the future. Considering these factors, the overall suicide risk at this point appears to be low. Patient is appropriate for outpatient follow up.  Pt is a 57 yo male who presented voluntarily accompanied by his family and assisted by his adult son. Pt was requesting detox from alcohol. A mobile translator unit was used during the assessment to assist the pt. Pt appeared intoxicated and was visibly shaking. Pt had to be assisted by his son in ambulating. Pt became nauseous and vomited during the assessment. Pt denied SI, HI, NSSH, AVH, paranoia and any other substance use except for alcohol consumption. Pt stated he has never been admitted to a psychiatric hospital. Pt stated that had been in the hospital and been discharged 12/06/22. Pt stated that he was in the hospital because he fell while intoxicated and hurt his back and hip. Per chart per NP pt was detoxed while being treated in the hospital. Pt stated he relapsed after his discharge. Pt stated that he was drinking 2 beers over a 48 hour period. It appeared that he was very intoxicated and in withdrawal. Pt stated that he usually drinks a 6 pack of beer on the days he drinks. Just before pt was admitted previously, he went into the bathroom at home and passed out injuring himself. His family had to call 911 and paramedics kicked in the door to reach him and take him to the hospital per son. Son Derek Cruz was present per pt's request.   Pt stated that he lives with his  partner and the mother of his children and has for 25 years. Pt stated he has 3 children- 1 son and 2 daughters. Pt stated that he has never been diagnosed with a psychiatric diagnosis and does not have any OP psychiatric providers or medications. 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 Per chart, pt has been through substance use treatment at [facility] several times in the past.   Pt appeared intoxicated and was visibly shaking. Pt had to be assisted by his son in ambulating. Pt became nauseous and vomited during the assessment. Pt was somewhat alert and seemed oriented and was answering questions appropriately through the translator. Pt was casually dressed and looked disheveled. Pt's mood was somber and he had a flat affect. Pt's judgment and insight were poor.    Chief Complaint: No chief complaint on file.  Visit Diagnosis:  Alcohol Use d/o, Severe    CCA Screening, Triage and Referral (STR)  Patient Reported Information How did you hear about [name]? Family/Friend  What Is the Reason for Your Visit/Call Today? Pt is a 57 yo male who presented voluntarily accompanied by his family and assisted by his adult son. Pt was requesting detox from alcohol. A mobile translator unit was used during the assessment to assist the pt.  Pt appeared intoxicated and was visibly shaking. Pt had to be assisted by his son in ambulating. Pt became nauseaus and vomited during the assessment. Pt denied SI, HI, NSSH, AVH, paranoia and any other substance use except for alcohol consumption. Pt stated he  has never been admitted to a psychiatric hospital. Pt stated that had been in the hospital and been discharged 12/06/22.  Pt stated that he was in the hospital because he fell while intoxicated and hurt his back and hip. Per chart per NP pt was detoxed while being treated in the hospital. Pt stated he relapsed after his discharge.Pt stated that he was drinking 2 beers over a 48 hour period. It appeared that he was very  intoxicated and in withdrawal. Pt stated that he usually drinks a 6 pack of beer on the days he drinks. Just before pt was admitted previously, he went into the bathroom at home and passed out injuring himself. His family had to call 911 and paramedics kicked in the door to reach him and take him to the hospital per son. Son Derek Cruz was present per pt's request.  How Long Has This Been Causing You Problems? > than 6 months  What Do You Feel Would Help You the Most Today? Alcohol or Drug Use Treatment   Have You Recently Had Any Thoughts About Hurting Yourself? No  Are You Planning to Commit Suicide/Harm Yourself At This time? No   Flowsheet Row ED from 12/08/2022 in [facility] ED from 12/07/2022 in [facility] Emergency Department at [facility] ED to Hosp-Admission (Discharged) from 12/01/2022 in [place] [facility]  C-SSRS RISK CATEGORY No Risk No Risk No Risk       Have you Recently Had Thoughts About Hurting Someone Karolee Ohs? No  Are You Planning to Harm Someone at This Time? No  Explanation: n/a   Have You Used Any Alcohol or Drugs in the Past 24 Hours? Yes  What Did You Use and How Much? na   Do You Currently Have a Therapist/Psychiatrist? No  Name of Therapist/Psychiatrist: Name of Therapist/Psychiatrist: na   Have You Been Recently Discharged From Any Office Practice or Programs? Yes  Explanation of Discharge From Practice/Program: recently discharged from the hospital  with similar sx     CCA Screening Triage Referral Assessment Type of Contact: Face-to-Face  Telemedicine Service Delivery:   Is this Initial or Reassessment?   Date Telepsych consult ordered in [facility]:    Time Telepsych consult ordered in [facility]:    Location of Assessment: [facility] Assessment Services  Provider Location: [facility] Assessment Services   Collateral Involvement: son Public relations account executive particiated with pt's  permission/request   Does Patient Have a Automotive engineer Guardian? No  Legal Guardian Contact Information: na  Copy of Legal Guardianship Form: No - copy requested  Legal Guardian Notified of Arrival: -- (na)  Legal Guardian Notified of Pending Discharge: -- (na)  If Minor and Not Living with Parent(s), Who has Custody? adult  Is CPS involved or ever been involved? Never (none reported)  Is APS involved or ever been involved? Never (none reported)   Patient Determined To Be At Risk for Harm To Self or Others Based on Review of Patient Reported Information or Presenting Complaint? No  Method: No Plan  Availability of Means: No access or NA  Intent: Vague intent or NA  Notification Required: No need or identified person  Additional Information for Danger to Others Potential: -- (na)  Additional Comments for Danger to Others Potential: na  Are There Guns or Other Weapons in Your Home? No (denied)  Types of Guns/Weapons: na  Are These Weapons Safely Secured?                            -- (  na)  Who Could Verify You Are Able To Have These Secured: na  Do You Have any Outstanding Charges, Pending Court Dates, Parole/Probation? none reported  Contacted To Inform of Risk of Harm To Self or Others: -- (na)    Does Patient Present under Involuntary Commitment? No    [place] of Residence: [place]   Patient Currently Receiving the Following Services: Not Receiving Services   Determination of Need: Emergent (2 hours) (Per Vernard Gambles NP pt is recommended for inpatient detox from alcohol withdrawal and transfer to the ED due to the severity of sx.)   Options For Referral: Inpatient Hospitalization     CCA Biopsychosocial Patient Reported Schizophrenia/Schizoaffective Diagnosis in Past: No   Strengths: asking for help   Mental Health Symptoms Depression:   None   Duration of Depressive symptoms:    Mania:   None   Anxiety:    None    Psychosis:   None   Duration of Psychotic symptoms:    Trauma:   None   Obsessions:   None   Compulsions:   None   Inattention:   None   Hyperactivity/Impulsivity:   None   Oppositional/Defiant Behaviors:   None   Emotional Irregularity:   None   Other Mood/Personality Symptoms:   none    Mental Status Exam Appearance and self-care  Stature:   Average   Weight:   Average weight   Clothing:   Disheveled   Grooming:   Normal   Cosmetic use:   None   Posture/gait:   Normal   Motor activity:   Not Remarkable   Sensorium  Attention:   Normal   Concentration:   Normal   Orientation:   X5   Recall/memory:   Normal   Affect and Mood  Affect:   Appropriate   Mood:   -- (appropriate)   Relating  Eye contact:   Normal   Facial expression:   Sad   Attitude toward examiner:   Cooperative; Defensive   Thought and Language  Speech flow:  Normal   Thought content:   Appropriate to Mood and Circumstances   Preoccupation:   None   Hallucinations:   None   Organization:   Coherent   [employer] of Knowledge:   Average   Intelligence:   Average   Abstraction:   Normal   Judgement:   Normal   Reality Testing:   Adequate   Insight:   Fair   Decision Making:   Impulsive   Social Functioning  Social Maturity:   Impulsive   Social Judgement:   Heedless   Stress  Stressors:   Relationship   Coping Ability:   Overwhelmed; Exhausted   Skill Deficits:   Self-control; Responsibility; Self-care; Decision making   Supports:   Family     Religion: Religion/Spirituality Are You A Religious Person?: Yes How Might This Affect Treatment?: unknown  Leisure/Recreation: Leisure / Recreation Do You Have Hobbies?: No Leisure and Hobbies: na  Exercise/Diet: Exercise/Diet Do You Exercise?: No Have You Gained or Lost A Significant Amount of Weight in the Past Six Months?: No Do You Follow a  Special Diet?: No Do You Have Any Trouble Sleeping?: Yes Explanation of Sleeping Difficulties: poor   CCA Employment/Education Employment/Work Situation: Employment / Work Situation Employment Situation: Employed Work Stressors: none Patient's Job has Been Impacted by Current Illness: No Has Patient ever Been in Equities trader?: No  Education: Education Is Patient Currently Attending School?: No Last Grade  Completed: 8 ("grade school") Did You Attend College?: No Did You Have An Individualized Education Program (IIEP): No Did You Have Any Difficulty At School?: No Patient's Education Has Been Impacted by Current Illness: No   CCA Family/Childhood History Family and Relationship History: Family history Marital status: Long term relationship Long term relationship, how long?: 25 years What types of issues is patient dealing with in the relationship?: patients drinking Additional relationship information: none Does patient have children?: Yes How many children?: 3 How is patient's relationship with their children?: good  Childhood History:  Childhood History By whom was/is the patient raised?: Mother Did patient suffer any verbal/emotional/physical/sexual abuse as a child?: No Has patient ever been sexually abused/assaulted/raped as an adolescent or adult?: No Witnessed domestic violence?: No Has patient been affected by domestic violence as an adult?: No       CCA Substance Use Alcohol/Drug Use: Alcohol / Drug Use Pain Medications: see MAR Prescriptions: see MAR Over the Counter: see MAR History of alcohol / drug use?: Yes Longest period of sobriety (when/how long): none reported Negative Consequences of Use: Personal relationships Withdrawal Symptoms: Blackouts, Irritability, Nausea / Vomiting, Patient aware of relationship between substance abuse and physical/medical complications, Tremors, Weakness Substance #1 Name of Substance 1: alcohol 1 - Age of First  Use: unknown 1 - Amount (size/oz): 6 pack 1 - Frequency: daily 1 - Duration: ongoing 1 - Last Use / Amount: earlier today/2 beers per pt 1 - Method of Aquiring: purchase 1- Route of Use: drink. oral                       ASAM's:  Six Dimensions of Multidimensional Assessment  Dimension 1:  Acute Intoxication and/or Withdrawal Potential:   Dimension 1:  Description of individual's past and current experiences of substance use and withdrawal: Continued usage  Dimension 2:  Biomedical Conditions and Complications:   Dimension 2:  Description of patient's biomedical conditions and  complications: None reported  Dimension 3:  Emotional, Behavioral, or Cognitive Conditions and Complications:  Dimension 3:  Description of emotional, behavioral, or cognitive conditions and complications: Sadness, marital problems.  Dimension 4:  Readiness to Change:  Dimension 4:  Description of Readiness to Change criteria: Patient motivated for treatment.  Dimension 5:  Relapse, Continued use, or Continued Problem Potential:  Dimension 5:  Relapse, continued use, or continued problem potential critiera description: Continued usage.  Dimension 6:  Recovery/Living Environment:  Dimension 6:  Recovery/Iiving environment criteria description: Family is supportive of patients treatment.  ASAM Severity Score: ASAM's Severity Rating Score: 6  ASAM Recommended Level of Treatment: ASAM Recommended Level of Treatment: Level II Partial Hospitalization Treatment   Substance use Disorder (SUD) Substance Use Disorder (SUD)  Checklist Symptoms of Substance Use: Social, occupational, recreational activities given up or reduced due to use, Persistent desire or unsuccessful efforts to cut down or control use, Presence of craving or strong urge to use, Evidence of tolerance, Continued use despite persistent or recurrent social, interpersonal problems, caused or exacerbated by use  Recommendations for  Services/Supports/Treatments: Recommendations for Services/Supports/Treatments Recommendations For Services/Supports/Treatments: Inpatient Hospitalization  Discharge Disposition:    DSM5 Diagnoses: Patient Active Problem List   Diagnosis Date Noted   Alcohol dependence with withdrawal, uncomplicated (HCC) 12/01/2022   Frequent falls 12/01/2022   Elevated liver enzymes    Alcohol abuse    Liver failure (HCC) 05/07/2020   Alcoholic hepatitis    Hyponatremia    Polycythemia    Hemoglobin  A1c less than 7.0% 07/16/2019   Neck pain 03/31/2019   Muscle strain 03/31/2019   Undifferentiated abdominal pain 03/31/2019   Gastroesophageal reflux disease without esophagitis 11/24/2018   Chest discomfort 11/24/2018   Essential hypertension 11/24/2018   Pre-diabetes 11/24/2018   Hyperlipidemia 10/06/2016   Chest pain 09/26/2016   Uncontrolled type 2 diabetes mellitus with hyperglycemia, without long-term current use of insulin (HCC) 09/26/2016     Referrals to Alternative Service(s): Referred to Alternative Service(s):   Place:   Date:   Time:    Referred to Alternative Service(s):   Place:   Date:   Time:    Referred to Alternative Service(s):   Place:   Date:   Time:    Referred to Alternative Service(s):   Place:   Date:   Time:     Konner Warrior T, Counselor

## 2022-12-08 NOTE — ED Notes (Signed)
Pt is able to eat, drink and swallow without problems.

## 2022-12-08 NOTE — ED Provider Notes (Signed)
Accepted handoff at shift change from [facility]. Please see prior provider note for more detail.   Briefly: Patient is 57 y.o.   DDX: concern for Drank 6 bottles of isopropyl alcohol -- no intention to stop drinking. Plan to metabolize to freedom. Needs to pass PO challenge and go home.   Plan: passed PO challenge, vitals improved, able to dc at this time per plan. Patient not interested in stopping alcohol abuse at this time. Considered librium taper but patient declines at this time.     Olene Floss, PA-C 12/08/22 0740    Loetta Rough, MD 12/08/22 [phone]

## 2022-12-08 NOTE — Assessment & Plan Note (Signed)
Not on statin check lipid panel and LPA

## 2022-12-08 NOTE — ED Provider Notes (Signed)
Behavioral Health Urgent Care Medical Screening Exam  Patient Name: Calil Amor MRN: [id number] Date of Evaluation: 12/08/22 Chief Complaint:  Requesting alcohol detox and alcohol withdrawal symptoms  Diagnosis:  Final diagnoses:  Alcohol withdrawal syndrome with complication (HCC)    History of Present illness: Elbridge Sharrieff Spratlin is a 57 y.o. male patient presented to [facility] as a walk in accompanied by his son with complaints of alochol withdrawal symptoms and desire for alcohol detox  Spanish Advertising account executive used, patient does speak some English.  Duard Spiewak, 57 y.o., male patient seen face to face by this provider on 12/08/2022.  Patient has a medical history significant for type 2 diabetes, hyperlipidemia, hypertension and alcohol use disorder, alcohol withdrawal with complications.  Patient was was admitted [facility] from 12/01/2022-718/2024 related to fall in the setting of alcohol use.  He was discharged with a Librium taper but has not taken any of it.  It is documented that patient has a history of withdrawal seizures and ICU admissions.  Patient presented to [facility] emergency department on 12/07/2022  after EMS was called due to patient possibly drinking 6 bottles of isopropyl alcohol. EMS had to kick door in. He was discharged this am.   Patient presents to [facility] UC requesting alcohol detox.  He is also in severe alcohol withdrawal with a CIWA score of 33.  He is alert/oriented x 3.  However he appears confused at times with questions.  He does not answer all questions appropriately at times.  He is not forthcoming and vague about how much alcohol he is consuming.  He reported different amounts throughout the assessment.  It is unclear how much alcohol patient has consumed or what type of alcohol he has consumed.  He denies drinking any isopropyl alcohol and is not forthcoming.  He does admit to drinking immediately after he left the hospital.   However, he only admitted to drinking a few beers before presenting.  He has an extremely visible tremor visible through his whole body.  He is clammy and forehead is sweaty.  He is nauseated and threw up in the assessment room.  He is complaining of headache and chest pressure.  He is unsteady and has to be assisted with help from his son and a walker.  He is anxious and restless.  His blood pressure is 140/95 and pulse rate is 109.  Patient appears to be in acute alcohol withdrawal and may possibly need medical detox.  He is at high risk of alcohol withdrawal seizures and complications.  He will be transferred to the [facility] emergency department.  Dr. Anitra Lauth is the accepting MD.    [facility] Devoid ED from 12/07/2022 in [facility] Emergency Department at [facility] ED to Hosp-Admission (Discharged) from 12/01/2022 in [place] [facility] ED from 09/29/2022 in [facility] Emergency Department at [facility]  C-SSRS RISK CATEGORY No Risk No Risk No Risk       Psychiatric Specialty Exam  Presentation  General Appearance:Casual  Eye Contact:Good  Speech:Clear and Coherent  Speech Volume:Normal  Handedness:Right   Mood and Affect  Mood: Euthymic  Affect: Appropriate   Thought Process  Thought Processes: Coherent  Descriptions of Associations:Intact  Orientation:Full (Time, Place and Person)  Thought Content:WDL  Diagnosis of Schizophrenia or Schizoaffective disorder in past: No   Hallucinations:None  Ideas of Reference:None  Suicidal Thoughts:No  Homicidal Thoughts:No   Sensorium  Memory: Immediate Good; Recent Good; Remote Good  Judgment: Fair  Insight: Fair   Art therapist  Concentration: Good  Attention Span: Good  Recall: Dudley Major of Knowledge: Good  Language: Fair   Psychomotor Activity  Psychomotor Activity: Normal   Assets  Assets: Communication Skills; Desire for Improvement; Housing;  Resilience; Social Support   Sleep  Sleep: Fair  Number of hours:  -1   Physical Exam: Physical Exam Vitals and nursing note reviewed.  Constitutional:      General: He is in acute distress.     Appearance: He is ill-appearing and diaphoretic.  Cardiovascular:     Rate and Rhythm: Tachycardia present.  Pulmonary:     Effort: Pulmonary effort is normal. No respiratory distress.  Musculoskeletal:     Cervical back: Normal range of motion.  Neurological:     Mental Status: He is alert.  Psychiatric:        Mood and Affect: Mood is anxious.        Thought Content: Thought content normal.        Cognition and Memory: Cognition normal.        Judgment: Judgment is impulsive.    Review of Systems  Constitutional:  Positive for chills and diaphoresis.  Cardiovascular:  Positive for chest pain.  Gastrointestinal:  Positive for nausea and vomiting.  Musculoskeletal:  Positive for falls.  Neurological:  Positive for tremors.  Psychiatric/Behavioral:  Positive for substance abuse.    Blood pressure (!) 140/95, pulse (!) 109, temperature 98.2 F (36.8 C), temperature source Oral, resp. rate 20, SpO2 98%. There is no height or weight on file to calculate BMI.  Musculoskeletal: Strength & Muscle Tone: within normal limits Gait & Station: normal Patient leans: N/A   [facility] MSE Discharge Disposition for Follow up and Recommendations: Based on my evaluation the patient appears to have an emergency medical condition for which I recommend the patient be transferred to the emergency department for further evaluation.   Transfer patient to the [facility] emergency department for alcohol withdrawal symptoms and possible medical detox.  Dr. Anitra Lauth is the accepting MD.   Ardis Hughs, NP 12/08/2022, 1:49 PM

## 2022-12-08 NOTE — Progress Notes (Signed)
   12/08/22 1417  [place] Suicide Severity Rating Scale  1. Wish to be Dead No  2. Suicidal Thoughts No  6. Suicide Behavior Question No  C-SSRS RISK CATEGORY No Risk

## 2022-12-08 NOTE — Progress Notes (Signed)
   12/08/22 1308  [facility] Triage Screening (Walk-ins at [facility] only)  What Is the Reason for Your Visit/Call Today? Pt is a 57 yo male who presented voluntarily accompanied by his family and assisted by his adult son. Pt was requesting detox from alcohol. A mobile translator unit was used during the assessment to assist the pt.  Pt appeared intoxicated and was visibly shaking. Pt had to be assisted by his son in ambulating. Pt became nauseaus and vomited during the assessment. Pt denied SI, HI, NSSH, AVH, paranoia and any other substance use except for alcohol consumption. Pt stated he has never been admitted to a psychiatric hospital. Pt stated that had been in the hospital and been discharged 12/06/22.  Pt stated that he was in the hospital because he fell while intoxicated and hurt his back and hip. Per chart per NP pt was detoxed while being treated in the hospital. Pt stated he relapsed after his discharge.Pt stated that he was drinking 2 beers over a 48 hour period. It appeared that he was very intoxicated and in withdrawal. Pt stated that he usually drinks a 6 pack of beer on the days he drinks. Just before pt was admitted previously, he went into the bathroom at home and passed out injuring himself. His family had to call 911 and paramedics kicked in the door to reach him and take him to the hospital per son. Son [name] was present per pt's request.  How Long Has This Been Causing You Problems? > than 6 months  Have You Recently Had Any Thoughts About Hurting Yourself? No  Are You Planning to Commit Suicide/Harm Yourself At This time? No  Have you Recently Had Thoughts About Hurting Someone Karolee Ohs? No  Are You Planning To Harm Someone At This Time? No  Are you currently experiencing any auditory, visual or other hallucinations? No  Have You Used Any Alcohol or Drugs in the Past 24 Hours? Yes  How long ago did you use Drugs or Alcohol? Pt stated that he drank 2 beers earlier today.  What Did  You Use and How Much? na  Do you have any current medical co-morbidities that require immediate attention? Yes  Please describe current medical co-morbidities that require immediate attention: withdrawal sx from alcohol withdrawal per pt  Clinician description of patient physical appearance/behavior: Pt appeared intoxicated and was visibly shaking. Pt had to be assisted by his son in ambulating. Pt became nauseous and vomited during the assessment. Pt was somewhat alert and seemed oriented and was answering questions appropriately through the translator. Pt was casually dressed and looked disheveled. Pt's mood was somber and he had a flat affect. Pt's judgment and insight were poor.  What Do You Feel Would Help You the Most Today? Alcohol or Drug Use Treatment  If access to [facility] was not available, would you have sought care in the Emergency Department? Yes (NP Vernard Gambles decided after assessment that pt needed to be transferred to the ED for continuation of treatment due to the severity of his sx.)  Determination of Need Emergent (2 hours) (Per Vernard Gambles NP pt is recommended for inpatient detox from alcohol withdrawal and transfer to the ED due to the severity of sx.)  Options For Referral Inpatient Hospitalization

## 2022-12-08 NOTE — Assessment & Plan Note (Addendum)
Check T4 and T3 May benefit from thyroid ultrasound as an outpatient depending on workup If patient persistently tachycardic will start beta-blocker Possibly some of his presentation is secondary to hyper thyroidism

## 2022-12-08 NOTE — Discharge Instructions (Addendum)
Transfer to [facility] ED for medical clearance and possible medical detox Dr. Anitra Lauth is the accepting md.

## 2022-12-08 NOTE — H&P (Signed)
Derek Cruz WUJ:[id number] DOB: 06/11/1965 DOA: 12/08/2022     PCP: Rema Fendt, NP   Outpatient Specialists:      Patient arrived to ER on 12/08/22 at 1420 Referred by Attending Rondel Baton, MD   Patient coming from:    home Lives  With family    Chief Complaint:   Chief Complaint  Patient presents with   Chest Pain    Pt coming in via EMS with chest pain. Pt was at [facility] when he complained of chest pain. Pt said he had 2 beers when he was discharged from here this AM. Pt is tremulous. No other complaints.     HPI: Derek Cruz is a 57 y.o. male with medical history significant of ETOH withdrawal,   ETOH abuse, DM2, HLD, HTN possible CKD  Presented with  cp Hx of Etoh Abuse went to rehab but reported CP and was to ER Given hx of severe DT's and intubation in the past   Pt was requesting ETOH detox, discharged 12/06/22. Had a recent fall  He admits to only having a couple of beers but appeared severely intoxicated  Has not had much to eat since yesterday Reports no falls since last week    significant ETOH intake   Does not smoke   Lab Results  Component Value Date   SARSCOV2NAA NEGATIVE 05/07/2020      Regarding pertinent Chronic problems:    Hyperlipidemia - on statins Lipitor (atorvastatin) was held Lipid Panel     Component Value Date/Time   CHOL 192 09/25/2022 2052   CHOL 176 06/20/2021 1145   TRIG 169 (H) 09/25/2022 2052   HDL 98 09/25/2022 2052   HDL 43 06/20/2021 1145   CHOLHDL 2.0 09/25/2022 2052   VLDL 34 09/25/2022 2052   LDLCALC 60 09/25/2022 2052   LDLCALC 63 06/20/2021 1145   LDLCALC 143 (H) 01/29/2017 0912   LABVLDL 70 (H) 06/20/2021 1145     HTN not on meds     DM 2 -  Lab Results  Component Value Date   HGBA1C 10.8 (H) 09/25/2022   PO meds only,      CKD stage IIIa-   Estimated Creatinine Clearance: 64.6 mL/min (by C-G formula based on SCr of 1.22 mg/dL).  Lab Results  Component Value  Date   CREATININE 1.22 12/08/2022   CREATININE 1.70 (H) 12/08/2022   CREATININE 1.80 (H) 12/07/2022       While in ER:     Was given a dose of Phenobarbital with good results    Lab Orders         CBC         Comprehensive metabolic panel         Lipase, blood         Ethanol       CXR -  NON acute    Following Medications were ordered in ER: Medications  LORazepam (ATIVAN) tablet 1-4 mg (has no administration in time range)  thiamine (VITAMIN B1) tablet 100 mg (100 mg Oral Given 12/08/22 1825)    Or  thiamine (VITAMIN B1) injection 100 mg ( Intravenous See Alternative 12/08/22 1825)  folic acid (FOLVITE) tablet 1 mg (1 mg Oral Given 12/08/22 1825)  multivitamin with minerals tablet 1 tablet (1 tablet Oral Given 12/08/22 1825)  PHENObarbital (LUMINAL) 260 mg in sodium chloride 0.9 % 100 mL IVPB (0 mg Intravenous Stopped 12/08/22 1549)  ondansetron (ZOFRAN) injection 4 mg (4 mg  Intravenous Given 12/08/22 1515)  lactated ringers bolus 1,000 mL (0 mLs Intravenous Stopped 12/08/22 1707)  potassium chloride SA (KLOR-CON M) CR tablet 40 mEq (40 mEq Oral Given 12/08/22 1615)  diazepam (VALIUM) injection 10 mg (10 mg Intravenous Given 12/08/22 1704)  lactated ringers bolus 1,000 mL (1,000 mLs Intravenous New Bag/Given 12/08/22 1829)       ED Triage Vitals  Encounter Vitals Group     BP 12/08/22 1500 112/76     Systolic BP Percentile --      Diastolic BP Percentile --      Pulse Rate 12/08/22 1500 (!) 110     Resp 12/08/22 1506 17     Temp 12/08/22 1506 98.7 F (37.1 C)     Temp Source 12/08/22 1506 Oral     SpO2 12/08/22 1444 100 %     Weight 12/08/22 1451 168 lb (76.2 kg)     Height 12/08/22 1451 5\' 8"  (1.727 m)     Head Circumference --      Peak Flow --      Pain Score 12/08/22 1445 8     Pain Loc --      Pain Education --      Exclude from Growth Chart --   UJWJ(19)@     _________________________________________ Significant initial  Findings: Abnormal Labs Reviewed   CBC - Abnormal; Notable for the following components:      Result Value   RBC 4.10 (*)    HCT 38.2 (*)    All other components within normal limits  COMPREHENSIVE METABOLIC PANEL - Abnormal; Notable for the following components:   Potassium 3.2 (*)    CO2 19 (*)    Glucose, Bld 147 (*)    BUN <5 (*)    Calcium 8.6 (*)    ALT 61 (*)    All other components within normal limits  ETHANOL - Abnormal; Notable for the following components:   Alcohol, Ethyl (B) 48 (*)    All other components within normal limits      _________________________ Troponin   Cardiac Panel (last 3 results) Recent Labs    12/08/22 1435 12/08/22 1723  TROPONINIHS 6 6     ECG: Ordered Personally reviewed and interpreted by me showing: HR : 105 Rhythm: Sinus tachycardia  Left anterior fascicular block RSR' in V1 or V2, probably normal variant QTC 463   ________________   The recent clinical data is shown below. Vitals:   12/08/22 1645 12/08/22 1730 12/08/22 1848 12/08/22 1848  BP: 114/79 (!) 103/57  108/73  Pulse: (!) 106 (!) 115  91  Resp:  17    Temp:   98.2 F (36.8 C)   TempSrc:   Oral   SpO2: 97% 100%    Weight:      Height:        WBC     Component Value Date/Time   WBC 6.6 12/08/2022 1435   LYMPHSABS 1.1 12/01/2022 0700   LYMPHSABS 1.6 11/24/2018 1550   MONOABS 0.4 12/01/2022 0700   EOSABS 0.0 12/01/2022 0700   EOSABS 0.0 11/24/2018 1550   BASOSABS 0.1 12/01/2022 0700   BASOSABS 0.0 11/24/2018 1550    Lactic Acid, Venous    Component Value Date/Time   LATICACIDVEN 2.0 (HH) 12/08/2022 0026      ______________________________________________ Recent Labs  Lab 12/03/22 0532 12/04/22 0234 12/05/22 1478 12/06/22 0453 12/07/22 2154 12/07/22 2300 12/08/22 0135 12/08/22 1435  NA 133* 135 137 136 141  --  142 140  K 3.5 3.6 3.8 3.7 3.4*  --  3.2* 3.2*  CO2 23 25 21* 23 21*  --  19* 19*  GLUCOSE 131* 123* 127* 125* 242*  --  158* 147*  BUN 6 7 16 11 8   --  8 <5*   CREATININE 0.57* 0.72 0.74 0.60* 1.80*  --  1.70* 1.22  CALCIUM 8.9 9.3 9.1 8.9 9.1  --  7.9* 8.6*  MG 2.0 1.9 2.1 2.0  --  2.5*  --   --   PHOS 4.2 4.7* 4.8* 4.5  --   --   --   --     Cr   stable,  Lab Results  Component Value Date   CREATININE 1.22 12/08/2022   CREATININE 1.70 (H) 12/08/2022   CREATININE 1.80 (H) 12/07/2022    Recent Labs  Lab 12/02/22 0312 12/03/22 0532 12/07/22 2154 12/08/22 1435  AST 49* 86* 60* 40  ALT 32 62* 81* 61*  ALKPHOS 88 119 81 75  BILITOT 1.2 0.8 0.6 0.5  PROT 6.0* 6.8 7.7 7.2  ALBUMIN 3.1* 3.3* 4.1 3.8   Lab Results  Component Value Date   CALCIUM 8.6 (L) 12/08/2022   PHOS 4.5 12/06/2022          Plt: Lab Results  Component Value Date   PLT 209 12/08/2022         Recent Labs  Lab 12/04/22 0234 12/05/22 0546 12/06/22 0453 12/07/22 2154 12/08/22 1435  WBC 6.7 6.9 7.2 9.7 6.6  HGB 14.7 14.6 14.3 15.3 13.4  HCT 41.2 41.0 40.2 43.0 38.2*  MCV 88.8 91.5 90.1 90.5 93.2  PLT 183 178 174 213 209    HG/HCT * stable,  Down *Up from baseline see below    Component Value Date/Time   HGB 13.4 12/08/2022 1435   HGB 15.6 02/20/2021 1128   HCT 38.2 (L) 12/08/2022 1435   HCT 46.6 02/20/2021 1128   MCV 93.2 12/08/2022 1435   MCV 90 02/20/2021 1128      Recent Labs  Lab 12/03/22 0532 12/08/22 1435  LIPASE 110* 28   No results for input(s): "AMMONIA" in the last 168 hours.    .lab  _______________________________________________ Hospitalist was called for admission for alcohol withdrawal and hyperthyroidism  The following Work up has been ordered so far:  Orders Placed This Encounter  Procedures   CBC   Comprehensive metabolic panel   Lipase, blood   Ethanol   Document Height and Actual Weight   Vital signs every 6 hours X 48 hours, then per unit protocol   Refer to Sidebar Report for reference: ETOH Withdrawal Guidelines   Clinical Institute Withdrawal Assessent (CIWA)   If Ativan given, reassess Clinical  Institute Withdrawal Assessment (CIWA) q 1 hour   Notify Pharmacy to change IV Ativan to PO if tolerating POs well.   Notify physician (specify)   Consult to Transition of Care Team   Consult to hospitalist   EKG 12-Lead     OTHER Significant initial  Findings:  labs showing:     DM  labs:  HbA1C: Recent Labs    01/15/22 0918 05/01/22 1512 09/25/22 2052  HGBA1C 5.8* 7.1* 10.8*       CBG (last 3)  Recent Labs    12/05/22 2234 12/06/22 0755 12/06/22 1336  GLUCAP 227* 147* 148*          Cultures: No results found for: "SDES", "SPECREQUEST", "CULT", "REPTSTATUS"   Radiological Exams on Admission: No results  found. _______________________________________________________________________________________________________ Latest  Blood pressure 108/73, pulse 91, temperature 98.2 F (36.8 C), temperature source Oral, resp. rate 17, height 5\' 8"  (1.727 m), weight 76.2 kg, SpO2 100%.   Vitals  labs and radiology finding personally reviewed  Review of Systems:    Pertinent positives include:  fatigue chest pain,  Constitutional:  No weight loss, night sweats, Fevers, chills, , weight loss  HEENT:  No headaches, Difficulty swallowing,Tooth/dental problems,Sore throat,  No sneezing, itching, ear ache, nasal congestion, post nasal drip,  Cardio-vascular:  No  Orthopnea, PND, anasarca, dizziness, palpitations.no Bilateral lower extremity swelling  GI:  No heartburn, indigestion, abdominal pain, nausea, vomiting, diarrhea, change in bowel habits, loss of appetite, melena, blood in stool, hematemesis Resp:  no shortness of breath at rest. No dyspnea on exertion, No excess mucus, no productive cough, No non-productive cough, No coughing up of blood.No change in color of mucus.No wheezing. Skin:  no rash or lesions. No jaundice GU:  no dysuria, change in color of urine, no urgency or frequency. No straining to urinate.  No flank pain.  Musculoskeletal:  No joint pain or no  joint swelling. No decreased range of motion. No back pain.  Psych:  No change in mood or affect. No depression or anxiety. No memory loss.  Neuro: no localizing neurological complaints, no tingling, no weakness, no double vision, no gait abnormality, no slurred speech, no confusion  All systems reviewed and apart from HOPI all are negative _______________________________________________________________________________________________ Past Medical History:   Past Medical History:  Diagnosis Date   Alcohol abuse    Chest pain    Dyslipidemia    Elevated liver enzymes    Gastritis    Hypertension       No past surgical history on file.  Social History:  Ambulatory   independently      reports that he has quit smoking. He has been exposed to tobacco smoke. He has never used smokeless tobacco. He reports current alcohol use. He reports that he does not use drugs.     Family History:   Family History  Problem Relation Age of Onset   Diabetes Mellitus II Neg Hx    Stomach cancer Neg Hx    Colon cancer Neg Hx    ______________________________________________________________________________________________ Allergies: No Known Allergies   Prior to Admission medications   Medication Sig Start Date End Date Taking? Authorizing Provider  chlordiazePOXIDE (LIBRIUM) 10 MG capsule Take 1 capsule (10 mg total) by mouth 4 (four) times daily for 3 days, THEN 1 capsule (10 mg total) 3 (three) times daily for 3 days, THEN 1 capsule (10 mg total) 2 (two) times daily for 3 days, THEN 1 capsule (10 mg total) daily for 3 days. 12/06/22 12/18/22  Elgergawy, Leana Roe, MD  folic acid (FOLVITE) 1 MG tablet Take 1 tablet (1 mg total) by mouth daily. 12/06/22   Elgergawy, Leana Roe, MD  metFORMIN (GLUCOPHAGE-XR) 750 MG 24 hr tablet Take 2 tablets (1,500 mg total) by mouth daily with breakfast. 12/06/22 01/05/23  Elgergawy, Leana Roe, MD  Multiple Vitamin (MULTIVITAMIN WITH MINERALS) TABS tablet Take 1  tablet by mouth daily. 12/06/22   Elgergawy, Leana Roe, MD  thiamine (VITAMIN B1) 100 MG tablet Take 1 tablet (100 mg total) by mouth daily. 12/10/22   Elgergawy, Leana Roe, MD    ___________________________________________________________________________________________________ Physical Exam:    12/08/2022    6:48 PM 12/08/2022    5:30 PM 12/08/2022    4:45 PM  Vitals with BMI  Systolic 108 103 161  Diastolic 73 57 79  Pulse 91 115 106     1. General:  in No  Acute distress    Chronically ill   -appearing 2. Psychological: Alert and   Oriented 3. Head/ENT:    Dry Mucous Membranes                          Head Non traumatic, neck supple                           Poor Dentition 4. SKIN:  decreased Skin turgor,  Skin clean Dry and intact no rash    5. Heart: Regular rate and rhythm no  Murmur, no Rub or gallop 6. Lungs: no wheezes or crackles   7. Abdomen: Soft,  non-tender, Non distended bowel sounds present 8. Lower extremities: no clubbing, cyanosis, no  edema 9. Neurologically Grossly intact, moving all 4 extremities equally mild tremor 10. MSK: Normal range of motion    Chart has been reviewed  ______________________________________________________________________________________________  Assessment/Plan  57 y.o. male with medical history significant of ETOH withdrawal,   ETOH abuse, DM2, HLD, HTN possible CKD    Admitted for alcohol withdrawal   Present on Admission:  Alcohol withdrawal (HCC)  Chest pain  Uncontrolled type 2 diabetes mellitus with hyperglycemia, without long-term current use of insulin (HCC)  Hyperlipidemia  Chest discomfort  Abnormal TSH  Hypokalemia  CKD (chronic kidney disease), stage III (HCC)  Essential hypertension     Alcohol withdrawal (HCC) CIWA protocol ordered Transitional care coordinator for history of alcohol abuse. May benefit from rehab placement   Chest pain Troponin unremarkable somewhat reproducible by palpation obtain  chest x-ray Obtain echogram Check D-dimer  Uncontrolled type 2 diabetes mellitus with hyperglycemia, without long-term current use of insulin (HCC) With a sliding scale patient has not had much p.o. intake for the past 24 hours appears ketotic Check beta hydroxybutyrate acid Check UA Check magnesium and phosphate Nutritional consult Poorly controlled DM will have diabetic care coordinator consult  Hyperlipidemia Not on statin check lipid panel and LPA  Chest discomfort Unclear etiology obtain chest x-ray troponin unremarkable obtain echogram D-dimer Suspect most likely musculoskeletal  Hypokalemia Replace and recheck check magnesium level  Abnormal TSH Check T4 and T3 May benefit from thyroid ultrasound as an outpatient depending on workup If patient persistently tachycardic will start beta-blocker Possibly some of his presentation is secondary to hyper thyroidism  CKD (chronic kidney disease), stage III (HCC)  -chronic avoid nephrotoxic medications such as NSAIDs, Vanco Zosyn combo,  avoid hypotension, continue to follow renal function   Essential hypertension Allow permissive htn for tonight   Frequent falls PT ot Eval prior to dc    Other plan as per orders.  DVT prophylaxis:  SCD    Code Status:    Code Status: Prior FULL CODE as per patient  I had personally discussed CODE STATUS with patient   ACP none   Family Communication:   Family not at  Bedside    Diet diabetic   Disposition Plan:     likely will need placement for rehabilitation                            Following barriers for discharge:  Electrolytes corrected                                      Consult Orders  (From admission, onward)           Start     Ordered   12/08/22 1808  Consult to hospitalist  Pg sent by deloris  Once       Provider:  (Not yet assigned)  Question Answer Comment  Place call to: [facility]   Reason for Consult Admit       12/08/22 1807                               Would benefit from PT/OT eval prior to DC  Ordered                                       Transition of care consulted                   Nutrition    consulted                                     Consults called: None   Admission status:  ED Disposition     ED Disposition  Admit   Condition  --   Comment  The patient appears reasonably stabilized for admission considering the current resources, flow, and capabilities available in the ED at this time, and I doubt any other [facility] requiring further screening and/or treatment in the ED prior to admission is  present.           inpatient     I Expect 2 midnight stay secondary to severity of patient's current illness need for inpatient interventions justified by the following:  hemodynamic instability despite optimal treatment (tachycardia  )  Severe lab/radiological/exam abnormalities including:   Alcohol withdrawal and extensive comorbidities including:  substance abuse     That are currently affecting medical management.   I expect  patient to be hospitalized for 2 midnights requiring inpatient medical care.  Patient is at high risk for adverse outcome (such as loss of life or disability) if not treated.  Indication for inpatient stay as follows:  Severe change from baseline regarding mental status    persistent chest pain despite medical management     Need for IV fluids, IV benzoes     Level of care        progressive tele indefinitely please discontinue once patient no longer qualifies COVID-19 Labs   Paulett Kaufhold 12/08/2022, 9:06 PM    [facility]     after 2 AM please page floor coverage PA If 7AM-7PM, please contact the day team taking care of the patient using [email or web address]

## 2022-12-08 NOTE — ED Provider Notes (Signed)
[facility] EMERGENCY DEPARTMENT AT [facility] Provider Note   CSN: [id number] Arrival date & time: 12/07/22  2144     History  Chief Complaint  Patient presents with   Alcohol Intoxication    Derek Cruz is a 57 y.o. male.  Patient presents to the emergency department via EMS after apparently drinking 6 bottles of isopropyl alcohol in an effort to become intoxicated.  Patient was discharged from the hospital on July 18 due to alcohol dependence with withdrawal.  He apparently had no other alcohol at home upon going home and drank the isopropyl alcohol instead.  Upon arrival the patient is alert and oriented but appears intoxicated.  He denies abdominal pain, chest pain, shortness of breath, urinary symptoms, GI bleed.  He does endorse nausea with some episodes of emesis.  Past medical history significant for frequent falls, uncontrolled type II DM, GERD, liver failure, alcohol dependence  Spanish language interpreter used throughout encounter  HPI     Home Medications Prior to Admission medications   Medication Sig Start Date End Date Taking? Authorizing Provider  chlordiazePOXIDE (LIBRIUM) 10 MG capsule Take 1 capsule (10 mg total) by mouth 4 (four) times daily for 3 days, THEN 1 capsule (10 mg total) 3 (three) times daily for 3 days, THEN 1 capsule (10 mg total) 2 (two) times daily for 3 days, THEN 1 capsule (10 mg total) daily for 3 days. 12/06/22 12/18/22  Elgergawy, Leana Roe, MD  folic acid (FOLVITE) 1 MG tablet Take 1 tablet (1 mg total) by mouth daily. 12/06/22   Elgergawy, Leana Roe, MD  metFORMIN (GLUCOPHAGE-XR) 750 MG 24 hr tablet Take 2 tablets (1,500 mg total) by mouth daily with breakfast. 12/06/22 01/05/23  Elgergawy, Leana Roe, MD  Multiple Vitamin (MULTIVITAMIN WITH MINERALS) TABS tablet Take 1 tablet by mouth daily. 12/06/22   Elgergawy, Leana Roe, MD  thiamine (VITAMIN B1) 100 MG tablet Take 1 tablet (100 mg total) by mouth daily. 12/10/22   Elgergawy,  Leana Roe, MD      Allergies    Patient has no known allergies.    Review of Systems   Review of Systems  Physical Exam Updated Vital Signs BP (!) 121/58   Pulse (!) 114   Temp 98.8 F (37.1 C) (Oral)   Resp 18   SpO2 97%  Physical Exam Vitals and nursing note reviewed.  Constitutional:      General: He is not in acute distress.    Appearance: He is well-developed.  HENT:     Head: Normocephalic and atraumatic.  Eyes:     Conjunctiva/sclera: Conjunctivae normal.  Cardiovascular:     Rate and Rhythm: Regular rhythm. Tachycardia present.  Pulmonary:     Effort: Pulmonary effort is normal. No respiratory distress.     Breath sounds: Normal breath sounds.  Abdominal:     Palpations: Abdomen is soft.     Tenderness: There is no abdominal tenderness.  Musculoskeletal:        General: No swelling.     Cervical back: Neck supple.     Right lower leg: No edema.     Left lower leg: No edema.  Skin:    General: Skin is warm and dry.     Capillary Refill: Capillary refill takes less than 2 seconds.  Neurological:     Mental Status: He is alert and oriented to person, place, and time.  Psychiatric:        Mood and Affect: Mood normal.  ED Results / Procedures / Treatments   Labs (all labs ordered are listed, but only abnormal results are displayed) Labs Reviewed  COMPREHENSIVE METABOLIC PANEL - Abnormal; Notable for the following components:      Result Value   Potassium 3.4 (*)    CO2 21 (*)    Glucose, Bld 242 (*)    Creatinine, Ser 1.80 (*)    AST 60 (*)    ALT 81 (*)    GFR, Estimated 43 (*)    Anion gap 17 (*)    All other components within normal limits  SALICYLATE LEVEL - Abnormal; Notable for the following components:   Salicylate Lvl <7.0 (*)    All other components within normal limits  ACETAMINOPHEN LEVEL - Abnormal; Notable for the following components:   Acetaminophen (Tylenol), Serum <10 (*)    All other components within normal limits  RAPID  URINE DRUG SCREEN, HOSP PERFORMED - Abnormal; Notable for the following components:   Benzodiazepines POSITIVE (*)    All other components within normal limits  LACTIC ACID, PLASMA - Abnormal; Notable for the following components:   Lactic Acid, Venous 2.4 (*)    All other components within normal limits  LACTIC ACID, PLASMA - Abnormal; Notable for the following components:   Lactic Acid, Venous 2.0 (*)    All other components within normal limits  MAGNESIUM - Abnormal; Notable for the following components:   Magnesium 2.5 (*)    All other components within normal limits  BASIC METABOLIC PANEL - Abnormal; Notable for the following components:   Potassium 3.2 (*)    CO2 19 (*)    Glucose, Bld 158 (*)    Creatinine, Ser 1.70 (*)    Calcium 7.9 (*)    GFR, Estimated 46 (*)    All other components within normal limits  OSMOLALITY - Abnormal; Notable for the following components:   Osmolality 345 (*)    All other components within normal limits  ETHANOL  CBC  BETA-HYDROXYBUTYRIC ACID    EKG None  Radiology No results found.  Procedures .Critical Care  Performed by: Darrick Grinder, PA-C Authorized by: Darrick Grinder, PA-C   Critical care provider statement:    Critical care time (minutes):  30   Critical care was necessary to treat or prevent imminent or life-threatening deterioration of the following conditions:  Toxidrome   Critical care was time spent personally by me on the following activities:  Development of treatment plan with patient or surrogate, discussions with consultants, evaluation of patient's response to treatment, examination of patient, ordering and review of laboratory studies, ordering and review of radiographic studies, ordering and performing treatments and interventions, pulse oximetry, re-evaluation of patient's condition and review of old charts     Medications Ordered in ED Medications  sodium chloride 0.9 % bolus 1,000 mL (0 mLs Intravenous  Stopped 12/08/22 0038)  sodium chloride 0.9 % bolus 1,000 mL (0 mLs Intravenous Stopped 12/08/22 0149)  ondansetron (ZOFRAN) injection 4 mg (4 mg Intravenous Given 12/08/22 0235)  ondansetron (ZOFRAN) injection 4 mg (4 mg Intravenous Given 12/08/22 1308)    ED Course/ Medical Decision Making/ A&P Clinical Course as of 12/08/22 0636  Sat Dec 08, 2022  0628 Drank 6 bottles of isopropyl alcohol -- no intention to stop drinking. Plan to metabolize to freedom. Needs to pass PO challenge and go home.  [CP]    Clinical Course User Index [CP] Prosperi, Christian H, PA-C  Medical Decision Making Amount and/or Complexity of Data Reviewed Labs: ordered.  Risk Prescription drug management.   This patient presents to the ED for concern of altered mental status, this involves an extensive number of treatment options, and is a complaint that carries with it a high risk of complications and morbidity.  The differential diagnosis includes alcohol intoxication, metabolic abnormality, others   Co morbidities that complicate the patient evaluation  History of alcohol withdrawal, alcohol abuse   Additional history obtained:  Additional history obtained from EMS External records from outside source obtained and reviewed including discharge summary from July 18   Lab Tests:  I Ordered, and personally interpreted labs.  The pertinent results include: Creatinine 1.7 (baseline 0.75), initial lactic acid 2.4 with repeat 2.0; negative ethanol level (expected in setting of isopropyl alcohol ingestion); UDS positive for benzodiazepine; potassium 3.4    Cardiac Monitoring: / EKG:  The patient was maintained on a cardiac monitor.  I personally viewed and interpreted the cardiac monitored which showed an underlying rhythm of: Sinus tachycardia   Consultations Obtained:  I discussed the case with poison control   Problem List / ED Course / Critical interventions /  Medication management   I ordered medication including Zofran for nausea, normal saline boluses for fluid resuscitation Reevaluation of the patient after these medicines showed that the patient improved I have reviewed the patients home medicines and have made adjustments as needed   Social Determinants of Health:  Patient has issues with housing security   Test / Admission - Considered:  Poison control recommended labs as ordered.  Anion gap, increased osmolality expected based on isopropyl alcohol ingestion.  Clinically patient appears sober at this time.  Patient was able to drink water without difficulty and without further episodes of nausea/emesis. Patient would like to discharge at this time. He denies SI/HI. I see no indication for admission. Poison control did not have recommendations for extended observation. Considered librium taper but patient voices no intent to attempt alcohol cessation.   Patient voices increased nausea and lightheadedness when he attempts to ambulate. Zofran ordered. Patient care transferred to [facility], PA-C. Plan for likely disposition home if patient tolerates PO, able to ambulate.          Final Clinical Impression(s) / ED Diagnoses Final diagnoses:  Acute alcoholic intoxication with complication [facility])    Rx / DC Orders ED Discharge Orders     None         Pamala Duffel 12/08/22 0636    Mesner, Barbara Cower, MD 12/08/22 [phone]

## 2022-12-08 NOTE — Assessment & Plan Note (Signed)
Replace and recheck check magnesium level

## 2022-12-08 NOTE — Subjective & Objective (Signed)
Hx of Etoh Abuse went to rehab but reported CP and was to ER Given hx of severe DT's and intubation in the past

## 2022-12-08 NOTE — Assessment & Plan Note (Signed)
Troponin unremarkable somewhat reproducible by palpation obtain chest x-ray Obtain echogram Check D-dimer

## 2022-12-09 ENCOUNTER — Observation Stay (HOSPITAL_BASED_OUTPATIENT_CLINIC_OR_DEPARTMENT_OTHER): Payer: Self-pay

## 2022-12-09 DIAGNOSIS — R079 Chest pain, unspecified: Secondary | ICD-10-CM

## 2022-12-09 DIAGNOSIS — F10930 Alcohol use, unspecified with withdrawal, uncomplicated: Secondary | ICD-10-CM

## 2022-12-09 DIAGNOSIS — E876 Hypokalemia: Secondary | ICD-10-CM

## 2022-12-09 DIAGNOSIS — I10 Essential (primary) hypertension: Secondary | ICD-10-CM

## 2022-12-09 DIAGNOSIS — E1165 Type 2 diabetes mellitus with hyperglycemia: Secondary | ICD-10-CM

## 2022-12-09 DIAGNOSIS — R296 Repeated falls: Secondary | ICD-10-CM

## 2022-12-09 DIAGNOSIS — R0789 Other chest pain: Secondary | ICD-10-CM

## 2022-12-09 DIAGNOSIS — E785 Hyperlipidemia, unspecified: Secondary | ICD-10-CM

## 2022-12-09 DIAGNOSIS — R7989 Other specified abnormal findings of blood chemistry: Secondary | ICD-10-CM

## 2022-12-09 LAB — COMPREHENSIVE METABOLIC PANEL
ALT: 49 U/L — ABNORMAL HIGH (ref 0–44)
AST: 31 U/L (ref 15–41)
Albumin: 3.4 g/dL — ABNORMAL LOW (ref 3.5–5.0)
Alkaline Phosphatase: 66 U/L (ref 38–126)
Anion gap: 7 (ref 5–15)
BUN: <5 mg/dL — ABNORMAL LOW (ref 6–20)
CO2: 23 mmol/L (ref 22–32)
Calcium: 8.8 mg/dL — ABNORMAL LOW (ref 8.9–10.3)
Chloride: 108 mmol/L (ref 98–111)
Creatinine, Ser: 1.08 mg/dL (ref 0.61–1.24)
GFR, Estimated: >60 mL/min (ref >60)
Glucose, Bld: 132 mg/dL — ABNORMAL HIGH (ref 70–99)
Potassium: 3.3 mmol/L — ABNORMAL LOW (ref 3.5–5.1)
Sodium: 138 mmol/L (ref 135–145)
Total Bilirubin: 0.5 mg/dL (ref 0.3–1.2)
Total Protein: 6.5 g/dL (ref 6.5–8.1)

## 2022-12-09 LAB — LIPID PANEL
Cholesterol: 183 mg/dL (ref 0–200)
HDL: 50 mg/dL (ref >40)
LDL Cholesterol: 115 mg/dL — ABNORMAL HIGH (ref 0–99)
Total CHOL/HDL Ratio: 3.7 RATIO
Triglycerides: 91 mg/dL (ref <150)
VLDL: 18 mg/dL (ref 0–40)

## 2022-12-09 LAB — CBG MONITORING, ED
Glucose-Capillary: 122 mg/dL — ABNORMAL HIGH (ref 70–99)
Glucose-Capillary: 115 mg/dL — ABNORMAL HIGH (ref 70–99)
Glucose-Capillary: 148 mg/dL — ABNORMAL HIGH (ref 70–99)
Glucose-Capillary: 109 mg/dL — ABNORMAL HIGH (ref 70–99)

## 2022-12-09 LAB — CBC
HCT: 34.6 % — ABNORMAL LOW (ref 39.0–52.0)
Hemoglobin: 12.1 g/dL — ABNORMAL LOW (ref 13.0–17.0)
MCH: 32.8 pg (ref 26.0–34.0)
MCHC: 35 g/dL (ref 30.0–36.0)
MCV: 93.8 fL (ref 80.0–100.0)
Platelets: 194 10*3/uL (ref 150–400)
RBC: 3.69 MIL/uL — ABNORMAL LOW (ref 4.22–5.81)
RDW: 14.6 % (ref 11.5–15.5)
WBC: 5.3 10*3/uL (ref 4.0–10.5)
nRBC: 0 % (ref 0.0–0.2)

## 2022-12-09 LAB — ECHOCARDIOGRAM COMPLETE
AR max vel: 2.41 cm2
AV Area VTI: 2.28 cm2
AV Area mean vel: 2.22 cm2
AV Mean grad: 4 mmHg
AV Peak grad: 5.5 mmHg
Ao pk vel: 1.17 m/s
Area-P 1/2: 3.42 cm2
Height: 68 in
S' Lateral: 3 cm
Weight: 2688 oz

## 2022-12-09 LAB — MAGNESIUM: Magnesium: 2.1 mg/dL (ref 1.7–2.4)

## 2022-12-09 LAB — D-DIMER, QUANTITATIVE: D-Dimer, Quant: 0.43 ug/mL-FEU (ref 0.00–0.50)

## 2022-12-09 LAB — PREALBUMIN: Prealbumin: 26 mg/dL (ref 18–38)

## 2022-12-09 LAB — T4, FREE: Free T4: 0.78 ng/dL (ref 0.61–1.12)

## 2022-12-09 LAB — PHOSPHORUS: Phosphorus: 3.7 mg/dL (ref 2.5–4.6)

## 2022-12-09 MED ORDER — POTASSIUM CHLORIDE CRYS ER 20 MEQ PO TBCR
40.0000 meq | EXTENDED_RELEASE_TABLET | ORAL | Status: AC
  Administered 2022-12-09 (×2): 40 meq via ORAL
  Filled 2022-12-09 (×2): qty 2

## 2022-12-09 NOTE — Discharge Summary (Signed)
Physician Discharge Summary   Patient: Derek Cruz MRN: [id number] DOB: 1966-03-27  Admit date:     12/08/2022  Discharge date: 12/09/22  Discharge Physician: Almon Hercules   PCP: Rema Fendt, NP   Recommendations at discharge:   Follow-up with PCP in 1 to 2 weeks Continue encouraging alcohol cessation/moderation Consider starting a statin at follow-up Check CMP and CBC in 1 week  Discharge Diagnoses: Principal Problem:   Alcohol withdrawal (HCC) Active Problems:   Chest pain   Uncontrolled type 2 diabetes mellitus with hyperglycemia, without long-term current use of insulin (HCC)   Hyperlipidemia   Chest discomfort   Essential hypertension   Frequent falls   Abnormal TSH   Hypokalemia   CKD (chronic kidney disease), stage III [facility])   Hospital Course: 57 year old M with PMH of EtOH abuse, NIDDM-2, HTN and HLD brought to ED by EMS due to chest pain, and admitted for chest pain and alcohol withdrawal.  Patient was hospitalized 7/13-7/18 due to EtOH withdrawal/DTs and fall related to alcohol and discharged on Librium taper, thiamine and folic acid.  He returned to ED the next day after found on the floor at home with empty bottles of isopropyl alcohol in the area.  He later admitted ingested isopropyl alcohol.  Poison control consulted in ED. Eventually cleared and discharged from ED and went to behavioral health urgent care for alcohol detox and withdrawal symptoms on 7/20.  His CIWA was elevated to 33.  He also complained of chest pain, and was sent to ED for further evaluation.  In ED, tachycardic to 120s.  CIWA elevated to 14.  EKG with sinus tachycardia but no acute ischemic finding.  Serial troponin negative.  Started on CIWA and admitted for alcohol withdrawal and chest pain workup.  Echocardiogram ordered.  The next day, patient remained stable.  CIWA improved to 4.  Patient did not require as needed Ativan for over 12 hours.  AKI resolved.  He had mild  hypokalemia for which he received potassium supplementation.  His TSH was low but free T4 was normal.  He felt better and wanted to go home.  He already have his prescriptions for Librium and other medications.  He would like to rest at home tonight and go to rehab for help with alcohol.  Evaluated by therapy and home health PT/OT ordered.  Patient's chest pain resolved.  ACS workup including serial troponin, EKG and echocardiogram reassuring.  Assessment and Plan: No notes have been filed under this hospital service. Service: Hospitalist  Alcohol withdrawal: Improved.  CIWA down from 14-4.  Did not require as needed Ativan for over 12 hours -Patient to continue Librium taper at home -Continue multivitamin, folic acid and thiamine -Encouraged alcohol cessation.  Provided with resources.  Atypical chest pain: ACS ruled out by serial troponin, EKG and echocardiogram.  Reproducible to palpation.  D-dimer negative.  Uncontrolled NIDDM-2 with hyperglycemia: A1c 10.8% on 5/7. Recent Labs  Lab 12/08/22 2332 12/09/22 0204 12/09/22 0339 12/09/22 0805 12/09/22 1218  GLUCAP 108* 122* 115* 148* 109*  -Continue home metformin -Encouraged alcohol cessation. -Consider starting statin outpatient.  Hypokalemia: Replenished prior to discharge.   Low TSH: Free T4 normal. -Consider rechecking thyroid panel in 4 to 6 weeks   AKI: Likely due to dehydration in the setting of alcohol abuse.  Resolved. -Recheck renal function in 1 week  Essential hypertension: BP improved. -Continue home meds.   Frequent falls due to alcohol -HH PT/OT ordered.  Consultants: None Procedures  performed: None Disposition: Home Diet recommendation:  Discharge Diet Orders (From admission, onward)     Start     Ordered   12/09/22 0000  Diet - low sodium heart healthy        12/09/22 0924   12/09/22 0000  Diet Carb Modified        12/09/22 0924           Cardiac and Carb modified diet DISCHARGE  MEDICATION: Allergies as of 12/09/2022   No Known Allergies      Medication List     TAKE these medications    CertaVite/Antioxidants Tabs Tome 1 tableta por va oral diariamente. (Take 1 tablet by mouth daily.)   chlordiazePOXIDE 10 MG capsule Commonly known as: LIBRIUM Take 1 capsule (10 mg total) by mouth 4 (four) times daily for 3 days, THEN 1 capsule (10 mg total) 3 (three) times daily for 3 days, THEN 1 capsule (10 mg total) 2 (two) times daily for 3 days, THEN 1 capsule (10 mg total) daily for 3 days. Start taking on: December 06, 2022   folic acid 1 MG tablet Commonly known as: FOLVITE Tome 1 tableta (1 mg en total) por va oral diariamente. (Take 1 tablet (1 mg total) by mouth daily.)   metFORMIN 750 MG 24 hr tablet Commonly known as: GLUCOPHAGE-XR Take 2 tablets (1,500 mg total) by mouth daily with breakfast.   thiamine 100 MG tablet Commonly known as: VITAMIN B1 Tome 1 tableta (100 mg en total) por va oral diariamente. (Take 1 tablet (100 mg total) by mouth daily.) Start taking on: December 10, 2022        Follow-up Information     Rema Fendt, NP. Schedule an appointment as soon as possible for a visit in 1 week(s).   Specialty: Nurse Practitioner Contact information: [address] [place] [address] [phone]                Discharge Exam: Ceasar Mons Weights   12/08/22 1451  Weight: 76.2 kg   GENERAL: No apparent distress.  Nontoxic. HEENT: MMM.  Vision and hearing grossly intact.  NECK: Supple.  No apparent JVD.  RESP:  No IWOB.  Fair aeration bilaterally. CVS:  RRR. Heart sounds normal.  ABD/GI/GU: BS+. Abd soft, NTND.  MSK/EXT:   No apparent deformity. Moves extremities. No edema.  SKIN: no apparent skin lesion or wound NEURO: Awake and alert. Oriented appropriately.  No apparent focal neuro deficit. PSYCH: Calm. Normal affect.   Condition at discharge: good  The results of significant diagnostics from this  hospitalization (including imaging, microbiology, ancillary and laboratory) are listed below for reference.   Imaging Studies: ECHOCARDIOGRAM COMPLETE  Result Date: 12/09/2022    ECHOCARDIOGRAM REPORT   Patient Name:   Derek Cruz Date of Exam: 12/09/2022 Medical Rec #:  [id number]                   Height:       68.0 in Accession #:    [id number]                  Weight:       168.0 lb Date of Birth:  May 08, 1966                    BSA:          1.898 m Patient Age:    74 years  BP:           133/95 mmHg Patient Gender: M                           HR:           84 bpm. Exam Location:  Inpatient Procedure: 2D Echo, Color Doppler and Cardiac Doppler Indications:    Chest Pain  History:        Patient has no prior history of Echocardiogram examinations.                 Signs/Symptoms:Chest Pain; Risk Factors:Dyslipidemia and                 Hypertension.  Sonographer:    Darlys Gales Referring Phys: [id number] ANASTASSIA DOUTOVA IMPRESSIONS  1. Left ventricular ejection fraction, by estimation, is 60 to 65%. The left ventricle has normal function. The left ventricle has no regional wall motion abnormalities. Left ventricular diastolic parameters were normal.  2. Right ventricular systolic function is normal. The right ventricular size is normal.  3. The mitral valve is normal in structure. Trivial mitral valve regurgitation. No evidence of mitral stenosis.  4. The aortic valve is tricuspid. Aortic valve regurgitation is not visualized. No aortic stenosis is present.  5. The inferior vena cava is normal in size with greater than 50% respiratory variability, suggesting right atrial pressure of 3 mmHg. Comparison(s): No prior Echocardiogram. FINDINGS  Left Ventricle: Left ventricular ejection fraction, by estimation, is 60 to 65%. The left ventricle has normal function. The left ventricle has no regional wall motion abnormalities. The left ventricular internal cavity size was normal in size.  There is  no left ventricular hypertrophy. Left ventricular diastolic parameters were normal. Right Ventricle: The right ventricular size is normal. Right ventricular systolic function is normal. Left Atrium: Left atrial size was normal in size. Right Atrium: Right atrial size was normal in size. Pericardium: There is no evidence of pericardial effusion. Mitral Valve: The mitral valve is normal in structure. Trivial mitral valve regurgitation. No evidence of mitral valve stenosis. Tricuspid Valve: The tricuspid valve is normal in structure. Tricuspid valve regurgitation is trivial. No evidence of tricuspid stenosis. Aortic Valve: The aortic valve is tricuspid. Aortic valve regurgitation is not visualized. No aortic stenosis is present. Aortic valve mean gradient measures 4.0 mmHg. Aortic valve peak gradient measures 5.5 mmHg. Aortic valve area, by VTI measures 2.28 cm. Pulmonic Valve: The pulmonic valve was normal in structure. Pulmonic valve regurgitation is not visualized. No evidence of pulmonic stenosis. Aorta: The aortic root is normal in size and structure. Venous: The inferior vena cava is normal in size with greater than 50% respiratory variability, suggesting right atrial pressure of 3 mmHg. IAS/Shunts: No atrial level shunt detected by color flow Doppler.  LEFT VENTRICLE PLAX 2D LVIDd:         5.10 cm   Diastology LVIDs:         3.00 cm   LV e' medial:    6.31 cm/s LV PW:         0.80 cm   LV E/e' medial:  12.9 LV IVS:        0.80 cm   LV e' lateral:   11.90 cm/s LVOT diam:     1.80 cm   LV E/e' lateral: 6.9 LV SV:         58 LV SV Index:   31 LVOT Area:  2.54 cm  RIGHT VENTRICLE             IVC RV Basal diam:  3.70 cm     IVC diam: 1.70 cm RV Mid diam:    3.20 cm RV S prime:     10.40 cm/s TAPSE (M-mode): 2.3 cm LEFT ATRIUM             Index        RIGHT ATRIUM           Index LA Vol (A2C):   42.2 ml 22.24 ml/m  RA Area:     12.60 cm LA Vol (A4C):   59.4 ml 31.30 ml/m  RA Volume:   23.80 ml  12.54  ml/m LA Biplane Vol: 53.4 ml 28.14 ml/m  AORTIC VALVE AV Area (Vmax):    2.41 cm AV Area (Vmean):   2.22 cm AV Area (VTI):     2.28 cm AV Vmax:           117.00 cm/s AV Vmean:          90.300 cm/s AV VTI:            0.256 m AV Peak Grad:      5.5 mmHg AV Mean Grad:      4.0 mmHg LVOT Vmax:         111.00 cm/s LVOT Vmean:        78.800 cm/s LVOT VTI:          0.229 m LVOT/AV VTI ratio: 0.89  AORTA Ao Root diam: 3.30 cm Ao Asc diam:  3.20 cm MITRAL VALVE               TRICUSPID VALVE MV Area (PHT): 3.42 cm    TR Peak grad:   18.8 mmHg MV Decel Time: 222 msec    TR Vmax:        217.00 cm/s MV E velocity: 81.60 cm/s MV A velocity: 70.00 cm/s  SHUNTS MV E/A ratio:  1.17        Systemic VTI:  0.23 m                            Systemic Diam: 1.80 cm Olga Millers MD Electronically signed by Olga Millers MD Signature Date/Time: 12/09/2022/11:56:59 AM    Final    DG CHEST PORT 1 VIEW  Result Date: 12/08/2022 CLINICAL DATA:  Chest pain. EXAM: PORTABLE CHEST 1 VIEW COMPARISON:  12/01/2022. FINDINGS: The heart size and mediastinal contours are within normal limits. Lung volumes are low resulting in vascular crowding. No consolidation, effusion, or pneumothorax. No acute osseous abnormality. IMPRESSION: No active disease. Electronically Signed   By: Thornell Sartorius M.D.   On: 12/08/2022 20:50   DG Elbow 2 Views Left  Result Date: 12/02/2022 CLINICAL DATA:  Left elbow pain EXAM: LEFT ELBOW - 2 VIEW COMPARISON:  None Available. FINDINGS: There is no evidence of fracture, dislocation, or joint effusion. Small olecranon spur is present. There is posterior elbow soft tissue swelling. Soft tissues are unremarkable. Antecubital IV is present. IMPRESSION: 1. No acute fracture or dislocation. 2. Posterior elbow soft tissue swelling. Electronically Signed   By: Darliss Cheney M.D.   On: 12/02/2022 22:02   CT CHEST ABDOMEN PELVIS W CONTRAST  Result Date: 12/01/2022 CLINICAL DATA:  57 year old male status post multiple  falls in the past 2 days. Pain. EXAM: CT CHEST, ABDOMEN, AND PELVIS WITH CONTRAST TECHNIQUE: Multidetector CT imaging  of the chest, abdomen and pelvis was performed following the standard protocol during bolus administration of intravenous contrast. RADIATION DOSE REDUCTION: This exam was performed according to the departmental dose-optimization program which includes automated exposure control, adjustment of the mA and/or kV according to patient size and/or use of iterative reconstruction technique. CONTRAST:  75mL OMNIPAQUE IOHEXOL 350 MG/ML SOLN COMPARISON:  CT Abdomen and Pelvis 05/07/2020. Cervical spine CT today reported separately. FINDINGS: CT CHEST FINDINGS Cardiovascular: Intact thoracic aorta. No periaortic hematoma. Normal heart size. No pericardial effusion. Other central mediastinal vascular structures appear intact. Mediastinum/Nodes: Negative. No mediastinal hematoma, lymphadenopathy, mass. Lungs/Pleura: Major airways are patent. Largely normal lung volumes and clear lungs. There is minor dependent atelectasis. No pneumothorax, pleural effusion, or pulmonary contusion. Musculoskeletal: Bulky left acromial osteophytosis. But the visible shoulder osseous structures appear intact. No sternal fracture identified. No rib fracture identified. No thoracic vertebral fracture identified. CT ABDOMEN PELVIS FINDINGS Hepatobiliary: Chronic fatty liver disease. No liver or gallbladder injury, perihepatic fluid identified. Pancreas: Negative. Spleen: Diminutive and intact.  No perisplenic fluid. Adrenals/Urinary Tract: Negative; adrenal glands and kidneys appear normal. Distended but otherwise unremarkable urinary bladder. Occasional pelvic phleboliths. Stomach/Bowel: No dilated large or small bowel. Redundant sigmoid colon. Mild retained stool. Normal appendix series 4, image 92. Small volume retained fluid in the stomach. Duodenum appears negative. No free air or free fluid identified. Vascular/Lymphatic: Major  arterial structures in the abdomen and pelvis appear patent and normal. No lymphadenopathy identified. Portal venous system appears to be patent. Reproductive: Negative. Other: No pelvis free fluid. Musculoskeletal: Chronic lumbar spine disc and endplate degeneration. Occasional vacuum disc. Mild chronic retrolisthesis of L1 on L2. Lower lumbar facet degeneration. Lumbar vertebrae, sacrum, SI joints, pelvis, and proximal femurs appear intact. Mild posterior left flank subcutaneous contusion series 4, image 84. No other superficial soft tissue injury identified. IMPRESSION: 1. Mild posterior left flank subcutaneous contusion. 2. No other acute traumatic injury identified in the chest, abdomen, or pelvis. 3. Chronic fatty liver disease. Electronically Signed   By: Odessa Fleming M.D.   On: 12/01/2022 08:11   CT Cervical Spine Wo Contrast  Result Date: 12/01/2022 CLINICAL DATA:  57 year old male status post multiple falls in the past 2 days. Pain. EXAM: CT CERVICAL SPINE WITHOUT CONTRAST TECHNIQUE: Multidetector CT imaging of the cervical spine was performed without intravenous contrast. Multiplanar CT image reconstructions were also generated. RADIATION DOSE REDUCTION: This exam was performed according to the departmental dose-optimization program which includes automated exposure control, adjustment of the mA and/or kV according to patient size and/or use of iterative reconstruction technique. COMPARISON:  CT head and face today reported separately. FINDINGS: Alignment: Straightening of cervical lordosis. Cervicothoracic junction alignment is within normal limits. Bilateral posterior element alignment is within normal limits. Skull base and vertebrae: Bone mineralization is within normal limits. Visualized skull base is intact. No atlanto-occipital dissociation. C1 and C2 appear intact and aligned. Motion artifact most pronounced at C4 and C5. No acute osseous abnormality identified. Soft tissues and spinal canal: No  prevertebral fluid or swelling. No visible canal hematoma. Larynx and pharynx motion artifact. Disc levels: Widespread cervical disc and endplate degeneration. 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 Upper chest: Chest CT today reported separately. IMPRESSION: 1. Mildly motion degraded. No acute traumatic injury identified in the cervical spine. 2. Widespread cervical disc and endplate degeneration.  Early OPLL. Electronically Signed   By: Odessa Fleming M.D.   On: 12/01/2022 08:04   CT Maxillofacial  WO CM  Result Date: 12/01/2022 CLINICAL DATA:  57 year old male status post multiple falls in the past 2 days. Pain. EXAM: CT MAXILLOFACIAL WITHOUT CONTRAST TECHNIQUE: Multidetector CT imaging of the maxillofacial structures was performed. Multiplanar CT image reconstructions were also generated. RADIATION DOSE REDUCTION: This exam was performed according to the departmental dose-optimization program which includes automated exposure control, adjustment of the mA and/or kV according to patient size and/or use of iterative reconstruction technique. COMPARISON:  CT head and cervical spine today reported separately. FINDINGS: Osseous: Mandible intact and normally located. No acute dental finding identified. Bilateral maxilla, zygoma, pterygoid, and nasal bones appear intact. Central skull base intact. Cervical spine detailed separately. Orbits: Chronic lamina papyracea fracture on the left. No acute orbital wall fracture. Globes and intraorbital soft tissues appear normal. Sinuses: Well aerated throughout. Tympanic cavities and mastoids appear clear. Soft tissues: Negative visible noncontrast deep soft tissue spaces of the face. No superficial soft tissue injury is identified. Limited intracranial: Reported separately today. IMPRESSION: 1.  No acute traumatic injury identified in the Face. 2. Chronic left lamina papyracea fracture. Electronically Signed   By: Odessa Fleming M.D.    On: 12/01/2022 08:01   DG Chest Port 1 View  Result Date: 12/01/2022 CLINICAL DATA:  57 year old male status post multiple falls in the past 2 days. Pain. EXAM: PORTABLE CHEST 1 VIEW COMPARISON:  Chest radiographs 09/23/2022 and earlier. FINDINGS: Portable AP upright view at 0720 hours. Lung volumes are within normal limits today. Normal cardiac size and mediastinal contours. Visualized tracheal air column is within normal limits. Allowing for portable technique the lungs are clear. No acute osseous abnormality identified. Negative visible bowel gas. IMPRESSION: Negative portable chest. Electronically Signed   By: Odessa Fleming M.D.   On: 12/01/2022 07:51   CT Head Wo Contrast  Result Date: 12/01/2022 CLINICAL DATA:  57 year old male status post multiple falls in the past 2 days. Pain. EXAM: CT HEAD WITHOUT CONTRAST TECHNIQUE: Contiguous axial images were obtained from the base of the skull through the vertex without intravenous contrast. RADIATION DOSE REDUCTION: This exam was performed according to the departmental dose-optimization program which includes automated exposure control, adjustment of the mA and/or kV according to patient size and/or use of iterative reconstruction technique. COMPARISON:  Head CT and brain MRI 09/29/2022. FINDINGS: Brain: Partially empty sella redemonstrated. Background brain volume within normal limits for age. No midline shift, ventriculomegaly, mass effect, evidence of mass lesion, intracranial hemorrhage or evidence of cortically based acute infarction. Gray-white matter differentiation is within normal limits throughout the brain. Vascular: No suspicious intracranial vascular hyperdensity. Mild Calcified atherosclerosis at the skull base. Skull: Chronic left lamina papyracea fracture. No acute osseous abnormality identified. Sinuses/Orbits: Visualized paranasal sinuses and mastoids are stable and well aerated. Other: Broad-based posterior vertex and left convexity scalp hematoma  (series 4, image 72). And there is trace associated scalp soft tissue gas. Underlying calvarium appears intact. Orbits soft tissues appears stable. IMPRESSION: 1. Posterior scalp hematoma, laceration with no underlying skull fracture. 2. Stable and negative for age noncontrast CT appearance of the brain. Electronically Signed   By: Odessa Fleming M.D.   On: 12/01/2022 07:50    Microbiology: Results for orders placed or performed during the hospital encounter of 05/07/20  Resp Panel by RT-PCR (Flu A&B, Covid) Nasopharyngeal Swab     Status: None   Collection Time: 05/07/20 10:05 PM   Specimen: Nasopharyngeal Swab; Nasopharyngeal(NP) swabs in vial transport medium  Result Value Ref Range Status   SARS  Coronavirus 2 by RT PCR NEGATIVE NEGATIVE Final    Comment: (NOTE) SARS-CoV-2 target nucleic acids are NOT DETECTED.  The SARS-CoV-2 RNA is generally detectable in upper respiratory specimens during the acute phase of infection. The lowest concentration of SARS-CoV-2 viral copies this assay can detect is 138 copies/mL. A negative result does not preclude SARS-Cov-2 infection and should not be used as the sole basis for treatment or other patient management decisions. A negative result may occur with  improper specimen collection/handling, submission of specimen other than nasopharyngeal swab, presence of viral mutation(s) within the areas targeted by this assay, and inadequate number of viral copies(<138 copies/mL). A negative result must be combined with clinical observations, patient history, and epidemiological information. The expected result is Negative.  Fact Sheet for Patients:  BloggerCourse.com  Fact Sheet for Healthcare Providers:  SeriousBroker.it  This test is no t yet approved or cleared by the [place] FDA and  has been authorized for detection and/or diagnosis of SARS-CoV-2 by FDA under an Emergency Use Authorization (EUA).  This EUA will remain  in effect (meaning this test can be used) for the duration of the COVID-19 declaration under Section 564(b)(1) of the Act, 21 U.S.C.section 360bbb-3(b)(1), unless the authorization is terminated  or revoked sooner.       Influenza A by PCR NEGATIVE NEGATIVE Final   Influenza B by PCR NEGATIVE NEGATIVE Final    Comment: (NOTE) The Xpert Xpress SARS-CoV-2/FLU/RSV plus assay is intended as an aid in the diagnosis of influenza from Nasopharyngeal swab specimens and should not be used as a sole basis for treatment. Nasal washings and aspirates are unacceptable for Xpert Xpress SARS-CoV-2/FLU/RSV testing.  Fact Sheet for Patients: BloggerCourse.com  Fact Sheet for Healthcare Providers: SeriousBroker.it  This test is not yet approved or cleared by the [place] FDA and has been authorized for detection and/or diagnosis of SARS-CoV-2 by FDA under an Emergency Use Authorization (EUA). This EUA will remain in effect (meaning this test can be used) for the duration of the COVID-19 declaration under Section 564(b)(1) of the Act, 21 U.S.C. section 360bbb-3(b)(1), unless the authorization is terminated or revoked.  Performed at [facility], [address] [place], [place] [address]     Labs: CBC: Recent Labs  Lab 12/05/22 0546 12/06/22 0453 12/07/22 2154 12/08/22 1435 12/09/22 0211  WBC 6.9 7.2 9.7 6.6 5.3  HGB 14.6 14.3 15.3 13.4 12.1*  HCT 41.0 40.2 43.0 38.2* 34.6*  MCV 91.5 90.1 90.5 93.2 93.8  PLT 178 174 213 209 194   Basic Metabolic Panel: Recent Labs  Lab 12/04/22 0234 12/05/22 0546 12/06/22 0453 12/07/22 2154 12/07/22 2300 12/08/22 0135 12/08/22 1435 12/08/22 1915 12/09/22 0211  NA 135 137 136 141  --  142 140 138 138  K 3.6 3.8 3.7 3.4*  --  3.2* 3.2* 3.4* 3.3*  CL 102 104 104 103  --  110 108 108 108  CO2 25 21* 23 21*  --  19* 19* 23 23  GLUCOSE 123* 127* 125* 242*  --   158* 147* 125* 132*  BUN 7 16 11 8   --  8 <5* <5* <5*  CREATININE 0.72 0.74 0.60* 1.80*  --  1.70* 1.22 1.13 1.08  CALCIUM 9.3 9.1 8.9 9.1  --  7.9* 8.6* 8.3* 8.8*  MG 1.9 2.1 2.0  --  2.5*  --   --  2.1 2.1  PHOS 4.7* 4.8* 4.5  --   --   --   --  3.1 3.7   Liver Function Tests: Recent Labs  Lab 12/03/22 0532 12/07/22 2154 12/08/22 1435 12/08/22 1915 12/09/22 0211  AST 86* 60* 40 29 31  ALT 62* 81* 61* 49* 49*  ALKPHOS 119 81 75 58 66  BILITOT 0.8 0.6 0.5 0.5 0.5  PROT 6.8 7.7 7.2 6.0* 6.5  ALBUMIN 3.3* 4.1 3.8 3.1* 3.4*   CBG: Recent Labs  Lab 12/08/22 2332 12/09/22 0204 12/09/22 0339 12/09/22 0805 12/09/22 1218  GLUCAP 108* 122* 115* 148* 109*    Discharge time spent: greater than 30 minutes.  Signed: Almon Hercules, MD [facility] 12/09/2022

## 2022-12-09 NOTE — Discharge Instructions (Addendum)
[facility]   [facility] [address] para los horarios de las reuniones [facility] [place] [phone]    [email or web address]   [facility] [employer] -    Seguro: Medicaid / Artist / Seguro privado   [phone] Fax: [phone]   [address] [place], [place], [address]

## 2022-12-09 NOTE — ED Notes (Signed)
Pt requesting to go.  MD aware and ok for pt to go with home healthcare follow up.  Pt states lives with children.

## 2022-12-09 NOTE — Progress Notes (Signed)
CSW received consult for patient for substance abuse resources. CSW added substance abuse resources to patient's AVS.  Edwin Dada, MSW, LCSW Transitions of Care  Clinical Social Worker II [phone]

## 2022-12-09 NOTE — Evaluation (Signed)
Occupational Therapy Evaluation Patient Details Name: Derek Cruz MRN: [id number] DOB: 10-Feb-1966 Today's Date: 12/09/2022   History of Present Illness 57 y.o. male presents to [facility] ED 12/08/2022 after being found down at home with bottles of iso-propyl alcohol in the area. Recently admitted and discharged 7/18 for alcohol poisoning as well. PMH significant for significant for frequent falls, uncontrolled DMII, GERD, liver failure, alcohol dependence.   Clinical Impression   PTA, pt reports he was independent and lived with his family. Upon eval, pt performing UB ADL with set-up and LB ADL with supervision. Pt requires up to min A for longer distance ambulation due to decreased balance. Also observed with tremor/shakiness throughout session at both EOB and while ambulating. Pt reporting "it's always like this when I drink too much". Pt with decreased coordination, balance, and problem solving. Will continue to follow acutely, but suspect pt will not need follow up therapy given time and opportunities for OOB activity.      Recommendations for follow up therapy are one component of a multi-disciplinary discharge planning process, led by the attending physician.  Recommendations may be updated based on patient status, additional functional criteria and insurance authorization.   Assistance Recommended at Discharge Intermittent Supervision/Assistance  Patient can return home with the following A little help with walking and/or transfers;A little help with bathing/dressing/bathroom;Assistance with cooking/housework;Direct supervision/assist for medications management;Direct supervision/assist for financial management;Assist for transportation;Help with stairs or ramp for entrance    Functional Status Assessment  Patient has had a recent decline in their functional status and demonstrates the ability to make significant improvements in function in a reasonable and predictable amount of  time.  Equipment Recommendations  None recommended by OT    Recommendations for Other Services PT consult     Precautions / Restrictions Precautions Precautions: Fall Restrictions Weight Bearing Restrictions: No      Mobility Bed Mobility Overal bed mobility: Needs Assistance Bed Mobility: Supine to Sit     Supine to sit: Min guard     General bed mobility comments: for safety    Transfers Overall transfer level: Needs assistance Equipment used: None Transfers: Sit to/from Stand Sit to Stand: Min guard           General transfer comment: for safety      Balance Overall balance assessment: Needs assistance Sitting-balance support: No upper extremity supported, Feet supported Sitting balance-Leahy Scale: Good     Standing balance support: No upper extremity supported, During functional activity Standing balance-Leahy Scale: Poor Standing balance comment: Likely to improve, still shaky at time of eval                           ADL either performed or assessed with clinical judgement   ADL Overall ADL's : Needs assistance/impaired Eating/Feeding: Independent   Grooming: Min guard;Standing   Upper Body Bathing: Set up;Sitting   Lower Body Bathing: Sit to/from stand;Min guard   Upper Body Dressing : Set up;Sitting   Lower Body Dressing: Sit to/from stand;Min guard   Toilet Transfer: Min guard;Ambulation;Minimal assistance Toilet Transfer Details (indicate cue type and reason): min guard A for very short distance; intermittent min A for longer distances. Toileting- Clothing Manipulation and Hygiene: Sit to/from stand;Min guard   Tub/ Shower Transfer: Tub transfer;Minimal assistance;Ambulation   Functional mobility during ADLs: Min guard;Minimal assistance General ADL Comments: 1-2 LOB requiring Min A to correct     Vision Ability to See in Adequate Light: 0  Adequate Patient Visual Report: No change from baseline Vision Assessment?: No  apparent visual deficits     Perception     Praxis      Pertinent Vitals/Pain Pain Assessment Pain Assessment: No/denies pain     Hand Dominance Right   Extremity/Trunk Assessment Upper Extremity Assessment Upper Extremity Assessment: RUE deficits/detail;LUE deficits/detail (shaky/tremor) RUE Coordination: decreased fine motor LUE Coordination: decreased fine motor   Lower Extremity Assessment Lower Extremity Assessment: Defer to PT evaluation   Cervical / Trunk Assessment Cervical / Trunk Assessment: Normal   Communication Communication Communication: No difficulties (Pt declines need for spanish interpreter)   Cognition Arousal/Alertness: Awake/alert Behavior During Therapy: WFL for tasks assessed/performed Overall Cognitive Status: Impaired/Different from baseline Area of Impairment: Problem solving                             Problem Solving: Slow processing General Comments: Pt able to count backward from 20 to 1 with signficiantly increased time. Oriented and following 1-2 step commands. Good recognition of tremor and "shakiness" as result of withdrawal and awareness of balance deficits q     General Comments  VSS    Exercises     Shoulder Instructions      Home Living Family/patient expects to be discharged to:: Private residence Living Arrangements: Spouse/significant other;Children Available Help at Discharge: Family;Available PRN/intermittently Type of Home: Mobile home Home Access: Stairs to enter Entrance Stairs-Number of Steps: 5 Entrance Stairs-Rails: Can reach both Home Layout: One level     Bathroom Shower/Tub: Chief Strategy Officer: Standard     Home Equipment: None          Prior Functioning/Environment Prior Level of Function : Independent/Modified Independent;Working/employed;Driving             Mobility Comments: works in [employer] ADLs Comments: Indep in ADL and IADL        OT Problem List:  Decreased strength;Impaired balance (sitting and/or standing);Decreased cognition;Decreased coordination;Decreased safety awareness      OT Treatment/Interventions: Self-care/ADL training;Therapeutic exercise;DME and/or AE instruction;Therapeutic activities;Patient/family education;Balance training    OT Goals(Current goals can be found in the care plan section) Acute Rehab OT Goals Patient Stated Goal: go home OT Goal Formulation: With patient Time For Goal Achievement: 12/23/22 Potential to Achieve Goals: Good  OT Frequency: Min 1X/week    Co-evaluation              AM-PAC OT "6 Clicks" Daily Activity     Outcome Measure Help from another person eating meals?: None Help from another person taking care of personal grooming?: A Lot Help from another person toileting, which includes using toliet, bedpan, or urinal?: A Lot Help from another person bathing (including washing, rinsing, drying)?: A Lot Help from another person to put on and taking off regular upper body clothing?: A Lot Help from another person to put on and taking off regular lower body clothing?: A Lot 6 Click Score: 14   End of Session Equipment Utilized During Treatment: Gait belt Nurse Communication: Mobility status  Activity Tolerance: Patient tolerated treatment well Patient left: in bed;with call bell/phone within reach  OT Visit Diagnosis: Unsteadiness on feet (R26.81);Muscle weakness (generalized) (M62.81);Other abnormalities of gait and mobility (R26.89);Other symptoms and signs involving cognitive function                Time: 1610-9604 OT Time Calculation (min): 22 min Charges:  OT General Charges $OT Visit: 1 Visit OT  Evaluation $OT Eval Moderate Complexity: 1 Mod  Tyler Deis, OTR/L [facility] Acute Rehabilitation Office: [phone]   Derek Cruz 12/09/2022, 10:31 AM

## 2022-12-09 NOTE — Care Management (Signed)
Messaged with team, ( MD, CSW)  not a candidate for homebound care, he is going to present to [facility] for detox, placed order for [facility] for OP therapies if needed.

## 2022-12-09 NOTE — Evaluation (Addendum)
Physical Therapy Evaluation Patient Details Name: Derek Cruz MRN: [id number] DOB: 30-Apr-1966 Today's Date: 12/09/2022  History of Present Illness  57 y.o. male presents to [facility] ED 12/08/2022 after being found down at home with bottles of iso-propyl alcohol in the area. Recently admitted and discharged 7/18 for alcohol poisoning as well. PMH significant for significant for frequent falls, uncontrolled DMII, GERD, liver failure, alcohol dependence.   Clinical Impression  Pt admitted with above diagnosis. PTA pt lived at home with family, independent mobility. He has had multiple recent ED visits/hospitalizations. Pt currently with functional limitations due to the deficits listed below (see PT Problem List). He required supervision bed mobility, min guard assist transfers, and min guard assist amb 200' without AD. Pt shaky/tremulous. He reports this always happens when he drinks too much. He is without report of pain of dizziness. Pt will benefit from acute skilled PT to increase their independence and safety with mobility to allow discharge.  Recommending OPPT upon d/c.         Assistance Recommended at Discharge Intermittent Supervision/Assistance  If plan is discharge home, recommend the following:  Can travel by private vehicle  A little help with walking and/or transfers;A little help with bathing/dressing/bathroom;Assistance with cooking/housework;Assist for transportation;Help with stairs or ramp for entrance        Equipment Recommendations None recommended by PT  Recommendations for Other Services       Functional Status Assessment Patient has had a recent decline in their functional status and demonstrates the ability to make significant improvements in function in a reasonable and predictable amount of time.     Precautions / Restrictions Precautions Precautions: Fall Restrictions Weight Bearing Restrictions: No      Mobility  Bed Mobility Overal bed  mobility: Needs Assistance Bed Mobility: Supine to Sit, Sit to Supine     Supine to sit: Supervision     General bed mobility comments: for safety    Transfers Overall transfer level: Needs assistance Equipment used: None Transfers: Sit to/from Stand Sit to Stand: Min guard           General transfer comment: for safety    Ambulation/Gait Ambulation/Gait assistance: Min guard Gait Distance (Feet): 200 Feet Assistive device: None Gait Pattern/deviations: Step-through pattern, Drifts right/left Gait velocity: WFL Gait velocity interpretation: 1.31 - 2.62 ft/sec, indicative of limited community ambulator   General Gait Details: Mildly unsteady, tremulous. No overt LOB.  Stairs            Wheelchair Mobility     Tilt Bed    Modified Rankin (Stroke Patients Only)       Balance Overall balance assessment: Needs assistance Sitting-balance support: No upper extremity supported, Feet unsupported Sitting balance-Leahy Scale: Good     Standing balance support: No upper extremity supported, During functional activity Standing balance-Leahy Scale: Fair                               Pertinent Vitals/Pain Pain Assessment Pain Assessment: No/denies pain    Home Living Family/patient expects to be discharged to:: Private residence Living Arrangements: Spouse/significant other;Children Available Help at Discharge: Family;Available PRN/intermittently Type of Home: Mobile home Home Access: Stairs to enter Entrance Stairs-Rails: Can reach both Entrance Stairs-Number of Steps: 5   Home Layout: One level Home Equipment: None      Prior Function Prior Level of Function : Independent/Modified Independent;Working/employed;Driving  Mobility Comments: works in [employer] ADLs Comments: Indep in ADL and IADL     Hand Dominance   Dominant Hand: Right    Extremity/Trunk Assessment   Upper Extremity Assessment Upper Extremity  Assessment: Defer to OT evaluation RUE Coordination: decreased fine motor LUE Coordination: decreased fine motor    Lower Extremity Assessment Lower Extremity Assessment: Overall WFL for tasks assessed    Cervical / Trunk Assessment Cervical / Trunk Assessment: Normal  Communication   Communication: No difficulties (Pt declines need for spanish interpreter)  Cognition Arousal/Alertness: Awake/alert Behavior During Therapy: WFL for tasks assessed/performed Overall Cognitive Status: Impaired/Different from baseline Area of Impairment: Problem solving                             Problem Solving: Slow processing          General Comments General comments (skin integrity, edema, etc.): VSS on RA    Exercises     Assessment/Plan    PT Assessment Patient needs continued PT services  PT Problem List Decreased balance;Decreased mobility;Decreased coordination;Decreased activity tolerance       PT Treatment Interventions Functional mobility training;Balance training;Patient/family education;Gait training;Therapeutic activities;Stair training;Therapeutic exercise    PT Goals (Current goals can be found in the Care Plan section)  Acute Rehab PT Goals Patient Stated Goal: to stop falling PT Goal Formulation: With patient Time For Goal Achievement: 12/23/22 Potential to Achieve Goals: Good    Frequency Min 3X/week     Co-evaluation               AM-PAC PT "6 Clicks" Mobility  Outcome Measure Help needed turning from your back to your side while in a flat bed without using bedrails?: None Help needed moving from lying on your back to sitting on the side of a flat bed without using bedrails?: None Help needed moving to and from a bed to a chair (including a wheelchair)?: A Little Help needed standing up from a chair using your arms (e.g., wheelchair or bedside chair)?: A Little Help needed to walk in hospital room?: A Little Help needed climbing 3-5 steps  with a railing? : A Little 6 Click Score: 20    End of Session Equipment Utilized During Treatment: Gait belt Activity Tolerance: Patient tolerated treatment well Patient left: in bed;with call bell/phone within reach Nurse Communication: Mobility status PT Visit Diagnosis: Other symptoms and signs involving the nervous system (R29.898);Difficulty in walking, not elsewhere classified (R26.2);History of falling (Z91.81)    Time: 1610-9604 PT Time Calculation (min) (ACUTE ONLY): 13 min   Charges:   PT Evaluation $PT Eval Low Complexity: 1 Low   PT General Charges $$ ACUTE PT VISIT: 1 Visit         Ferd Glassing., PT  Office # [phone]   Ilda Foil 12/09/2022, 11:32 AM

## 2022-12-10 ENCOUNTER — Other Ambulatory Visit: Payer: Self-pay

## 2022-12-10 ENCOUNTER — Ambulatory Visit (HOSPITAL_COMMUNITY)
Admission: EM | Admit: 2022-12-10 | Discharge: 2022-12-10 | Disposition: A | Discharge status: Other Acute Inpatient Hospital | Payer: No Payment, Other | Attending: Behavioral Health | Admitting: Behavioral Health

## 2022-12-10 ENCOUNTER — Telehealth: Payer: Self-pay

## 2022-12-10 ENCOUNTER — Encounter (HOSPITAL_COMMUNITY): Payer: Self-pay | Admitting: Behavioral Health

## 2022-12-10 ENCOUNTER — Emergency Department (HOSPITAL_COMMUNITY)
Admission: EM | Admit: 2022-12-10 | Discharge: 2022-12-11 | Disposition: A | Discharge status: Other Acute Inpatient Hospital | Payer: Self-pay | Attending: Emergency Medicine | Admitting: Emergency Medicine

## 2022-12-10 ENCOUNTER — Encounter (HOSPITAL_COMMUNITY): Payer: Self-pay

## 2022-12-10 ENCOUNTER — Emergency Department (HOSPITAL_COMMUNITY)
Admission: EM | Admit: 2022-12-10 | Discharge: 2022-12-10 | Discharge status: Against Medical Advice | Payer: Self-pay | Attending: Emergency Medicine | Admitting: Emergency Medicine

## 2022-12-10 DIAGNOSIS — F1090 Alcohol use, unspecified, uncomplicated: Secondary | ICD-10-CM | POA: Diagnosis present

## 2022-12-10 DIAGNOSIS — Z7984 Long term (current) use of oral hypoglycemic drugs: Secondary | ICD-10-CM | POA: Insufficient documentation

## 2022-12-10 DIAGNOSIS — Z5321 Procedure and treatment not carried out due to patient leaving prior to being seen by health care provider: Secondary | ICD-10-CM | POA: Insufficient documentation

## 2022-12-10 DIAGNOSIS — Z603 Acculturation difficulty: Secondary | ICD-10-CM | POA: Insufficient documentation

## 2022-12-10 DIAGNOSIS — F1010 Alcohol abuse, uncomplicated: Secondary | ICD-10-CM

## 2022-12-10 DIAGNOSIS — Z139 Encounter for screening, unspecified: Secondary | ICD-10-CM

## 2022-12-10 DIAGNOSIS — E1122 Type 2 diabetes mellitus with diabetic chronic kidney disease: Secondary | ICD-10-CM | POA: Insufficient documentation

## 2022-12-10 DIAGNOSIS — I129 Hypertensive chronic kidney disease with stage 1 through stage 4 chronic kidney disease, or unspecified chronic kidney disease: Secondary | ICD-10-CM | POA: Insufficient documentation

## 2022-12-10 DIAGNOSIS — E119 Type 2 diabetes mellitus without complications: Secondary | ICD-10-CM | POA: Insufficient documentation

## 2022-12-10 DIAGNOSIS — N183 Chronic kidney disease, stage 3 unspecified: Secondary | ICD-10-CM | POA: Insufficient documentation

## 2022-12-10 DIAGNOSIS — Y900 Blood alcohol level of less than 20 mg/100 ml: Secondary | ICD-10-CM | POA: Insufficient documentation

## 2022-12-10 DIAGNOSIS — F10120 Alcohol abuse with intoxication, uncomplicated: Secondary | ICD-10-CM | POA: Insufficient documentation

## 2022-12-10 DIAGNOSIS — F1310 Sedative, hypnotic or anxiolytic abuse, uncomplicated: Secondary | ICD-10-CM | POA: Insufficient documentation

## 2022-12-10 DIAGNOSIS — E785 Hyperlipidemia, unspecified: Secondary | ICD-10-CM | POA: Insufficient documentation

## 2022-12-10 LAB — CBC WITH DIFFERENTIAL/PLATELET
Abs Immature Granulocytes: 0.01 10*3/uL (ref 0.00–0.07)
Basophils Absolute: 0.1 10*3/uL (ref 0.0–0.1)
Basophils Relative: 1 %
Eosinophils Absolute: 0.2 10*3/uL (ref 0.0–0.5)
Eosinophils Relative: 5 %
HCT: 37.5 % — ABNORMAL LOW (ref 39.0–52.0)
Hemoglobin: 13 g/dL (ref 13.0–17.0)
Immature Granulocytes: 0 %
Lymphocytes Relative: 31 %
Lymphs Abs: 1.6 10*3/uL (ref 0.7–4.0)
MCH: 31.6 pg (ref 26.0–34.0)
MCHC: 34.7 g/dL (ref 30.0–36.0)
MCV: 91.2 fL (ref 80.0–100.0)
Monocytes Absolute: 0.7 10*3/uL (ref 0.1–1.0)
Monocytes Relative: 13 %
Neutro Abs: 2.6 10*3/uL (ref 1.7–7.7)
Neutrophils Relative %: 50 %
Platelets: 249 10*3/uL (ref 150–400)
RBC: 4.11 MIL/uL — ABNORMAL LOW (ref 4.22–5.81)
RDW: 13.5 % (ref 11.5–15.5)
WBC: 5.1 10*3/uL (ref 4.0–10.5)
nRBC: 0 % (ref 0.0–0.2)

## 2022-12-10 LAB — COMPREHENSIVE METABOLIC PANEL
ALT: 56 U/L — ABNORMAL HIGH (ref 0–44)
ALT: 48 U/L — ABNORMAL HIGH (ref 0–44)
AST: 42 U/L — ABNORMAL HIGH (ref 15–41)
AST: 33 U/L (ref 15–41)
Albumin: 4 g/dL (ref 3.5–5.0)
Albumin: 3.6 g/dL (ref 3.5–5.0)
Alkaline Phosphatase: 77 U/L (ref 38–126)
Alkaline Phosphatase: 81 U/L (ref 38–126)
Anion gap: 11 (ref 5–15)
Anion gap: 10 (ref 5–15)
BUN: 10 mg/dL (ref 6–20)
BUN: 13 mg/dL (ref 6–20)
CO2: 20 mmol/L — ABNORMAL LOW (ref 22–32)
CO2: 25 mmol/L (ref 22–32)
Calcium: 9 mg/dL (ref 8.9–10.3)
Calcium: 8.8 mg/dL — ABNORMAL LOW (ref 8.9–10.3)
Chloride: 103 mmol/L (ref 98–111)
Chloride: 99 mmol/L (ref 98–111)
Creatinine, Ser: 0.67 mg/dL (ref 0.61–1.24)
Creatinine, Ser: 0.78 mg/dL (ref 0.61–1.24)
GFR, Estimated: >60 mL/min (ref >60)
GFR, Estimated: >60 mL/min (ref >60)
Glucose, Bld: 97 mg/dL (ref 70–99)
Glucose, Bld: 177 mg/dL — ABNORMAL HIGH (ref 70–99)
Potassium: 4 mmol/L (ref 3.5–5.1)
Potassium: 3.6 mmol/L (ref 3.5–5.1)
Sodium: 134 mmol/L — ABNORMAL LOW (ref 135–145)
Sodium: 134 mmol/L — ABNORMAL LOW (ref 135–145)
Total Bilirubin: 0.7 mg/dL (ref 0.3–1.2)
Total Bilirubin: 0.7 mg/dL (ref 0.3–1.2)
Total Protein: 7.4 g/dL (ref 6.5–8.1)
Total Protein: 6.7 g/dL (ref 6.5–8.1)

## 2022-12-10 LAB — CBC
HCT: 41.3 % (ref 39.0–52.0)
Hemoglobin: 14.3 g/dL (ref 13.0–17.0)
MCH: 31.8 pg (ref 26.0–34.0)
MCHC: 34.6 g/dL (ref 30.0–36.0)
MCV: 92 fL (ref 80.0–100.0)
Platelets: 251 10*3/uL (ref 150–400)
RBC: 4.49 MIL/uL (ref 4.22–5.81)
RDW: 13.8 % (ref 11.5–15.5)
WBC: 5.3 10*3/uL (ref 4.0–10.5)
nRBC: 0 % (ref 0.0–0.2)

## 2022-12-10 LAB — RAPID URINE DRUG SCREEN, HOSP PERFORMED
Amphetamines: NOT DETECTED
Amphetamines: NOT DETECTED
Barbiturates: POSITIVE — AB
Barbiturates: POSITIVE — AB
Benzodiazepines: POSITIVE — AB
Benzodiazepines: POSITIVE — AB
Cocaine: NOT DETECTED
Cocaine: NOT DETECTED
Opiates: NOT DETECTED
Opiates: NOT DETECTED
Tetrahydrocannabinol: NOT DETECTED
Tetrahydrocannabinol: NOT DETECTED

## 2022-12-10 LAB — T3: T3, Total: 103 ng/dL (ref 71–180)

## 2022-12-10 LAB — ETHANOL
Alcohol, Ethyl (B): <10 mg/dL (ref <10)
Alcohol, Ethyl (B): <10 mg/dL (ref <10)

## 2022-12-10 NOTE — ED Triage Notes (Signed)
Pt requesting alcohol detox, states he drinks a 6 pack of beer daily, last drink was 11am this morning. Pt has no complaints at this time   PT LEFT WAITING ROOM WHEN BEING CALLED. ALL LABS DONE DURING PREVIOUS VISIT 2 HOURS AGO

## 2022-12-10 NOTE — ED Notes (Signed)
Pt called multiple times to waiting room, no answer.

## 2022-12-10 NOTE — ED Notes (Signed)
Two unsuccessful attempts to reach [facility] Charge Nurse to give report. VM left.

## 2022-12-10 NOTE — Transitions of Care (Post Inpatient/ED Visit) (Unsigned)
   12/10/2022  Name: Derek Cruz MRN: [id number] DOB: March 22, 1966  Today's TOC FU Call Status: Today's TOC FU Call Status:: Unsuccessul Call (1st Attempt) Unsuccessful Call (1st Attempt) Date: 12/10/22  Attempted to reach the patient regarding the most recent Inpatient/ED visit.  Follow Up Plan: Additional outreach attempts will be made to reach the patient to complete the Transitions of Care (Post Inpatient/ED visit) call.   Signature  Woodfin Ganja LPN [facility] Nurse Health Advisor Direct Dial [phone]

## 2022-12-10 NOTE — Progress Notes (Signed)
   12/10/22 1230  [facility] Triage Screening (Walk-ins at [facility] only)  How Did You Hear About [name]? Family/Friend  What Is the Reason for Your Visit/Call Today? Pt is a 57 yo male who presented voluntarily accompanied by his adult son seeking alcohol use treatment. Pt reports consuming two 12oz beers about 1 hour ago. Pt reports he was discharged from the ED yesterday after being treated for intoxication and chest pain.  Pt has a slow gait and shaking hands at this time but denies any withdrawal symptoms. Pt denies SI/HI and AVH.  How Long Has This Been Causing You Problems? > than 6 months  Have You Recently Had Any Thoughts About Hurting Yourself? No  Are You Planning to Commit Suicide/Harm Yourself At This time? No  Have you Recently Had Thoughts About Hurting Someone Karolee Ohs? No  Are You Planning To Harm Someone At This Time? No  Are you currently experiencing any auditory, visual or other hallucinations? No  Have You Used Any Alcohol or Drugs in the Past 24 Hours? Yes  How long ago did you use Drugs or Alcohol? today  What Did You Use and How Much? two 12oz beers.  Do you have any current medical co-morbidities that require immediate attention? No  Clinician description of patient physical appearance/behavior: calm, cooperative, shaking hands, slow gait, wearing shirt and shorts  What Do You Feel Would Help You the Most Today? Alcohol or Drug Use Treatment  If access to [facility] was not available, would you have sought care in the Emergency Department? No  Determination of Need Urgent (48 hours)  Options For Referral Facility-Based Crisis

## 2022-12-10 NOTE — BH Assessment (Signed)
Triage and Mental Status Exam (MSE) have been completed. The patient is deemed urgent. No Comprehensive Clinical Assessment (CCA) will be completed at this time, as the patient will be transferred to the emergency department for medical concerns and medical clearance.

## 2022-12-10 NOTE — ED Triage Notes (Signed)
Pt requesting alcohol detox, states he drinks a 6 pack of beer daily, last drink was 11am this morning. Pt has no complaints at this time

## 2022-12-10 NOTE — ED Provider Notes (Signed)
[place] EMERGENCY DEPARTMENT AT [facility] Provider Note   CSN: [id number] Arrival date & time: 12/10/22  1804     History {Add pertinent medical, surgical, social history, OB history to HPI:1} Chief Complaint  Patient presents with  . Drug / Alcohol Assessment    Derek Cruz is a 57 y.o. male.  Patient with past medical history significant for alcohol use disorder, alcohol withdrawal, type II DM presents to the emergency department from behavioral health urgent care for medical clearance.  Patient presented to the behavioral urgent care requesting detox from alcohol.  Patient typically drinks 6 beers daily.  He reports to me that his last drink was last night.  The patient was admitted for alcohol withdrawals from July 13 through July 18.  He then presented to the emergency department on July 20 intoxicated and reportedly drank 6 bottles of isopropyl alcohol.  He metabolized overnight and wished to discharge home.  He denied any intent to hurt himself at that time.  He also denied any intention of alcohol cessation and declined a Librium taper.  He was seen that same afternoon by behavioral health requesting alcohol detox, was transferred to the emergency department for medical clearance, and was admitted on July 20, discharged on the 21st.  He has been drinking since returning home yesterday afternoon.  Currently the patient has no complaints.  He denies tremors, headache, nausea, nervousness, hallucinations.  HPI     Home Medications Prior to Admission medications   Medication Sig Start Date End Date Taking? Authorizing Provider  chlordiazePOXIDE (LIBRIUM) 10 MG capsule Take 1 capsule (10 mg total) by mouth 4 (four) times daily for 3 days, THEN 1 capsule (10 mg total) 3 (three) times daily for 3 days, THEN 1 capsule (10 mg total) 2 (two) times daily for 3 days, THEN 1 capsule (10 mg total) daily for 3 days. Patient not taking: Reported on 12/10/2022 12/06/22  12/18/22  Elgergawy, Leana Roe, MD  folic acid (FOLVITE) 1 MG tablet Take 1 tablet (1 mg total) by mouth daily. 12/06/22   Elgergawy, Leana Roe, MD  metFORMIN (GLUCOPHAGE-XR) 750 MG 24 hr tablet Take 2 tablets (1,500 mg total) by mouth daily with breakfast. Patient not taking: Reported on 12/08/2022 12/06/22 01/05/23  Elgergawy, Leana Roe, MD  Multiple Vitamin (MULTIVITAMIN WITH MINERALS) TABS tablet Take 1 tablet by mouth daily. 12/06/22   Elgergawy, Leana Roe, MD  thiamine (VITAMIN B1) 100 MG tablet Take 1 tablet (100 mg total) by mouth daily. 12/10/22   Elgergawy, Leana Roe, MD      Allergies    Patient has no known allergies.    Review of Systems   Review of Systems  Physical Exam Updated Vital Signs BP 116/78   Pulse 95   Temp 97.9 F (36.6 C) (Oral)   Resp 19   SpO2 100%  Physical Exam Vitals and nursing note reviewed.  Constitutional:      General: He is not in acute distress.    Appearance: He is well-developed.  HENT:     Head: Normocephalic and atraumatic.  Eyes:     Conjunctiva/sclera: Conjunctivae normal.  Cardiovascular:     Rate and Rhythm: Normal rate and regular rhythm.     Heart sounds: No murmur heard. Pulmonary:     Effort: Pulmonary effort is normal. No respiratory distress.     Breath sounds: Normal breath sounds.  Abdominal:     Palpations: Abdomen is soft.     Tenderness: There is  no abdominal tenderness.  Musculoskeletal:        General: No swelling.     Cervical back: Neck supple.  Skin:    General: Skin is warm and dry.     Capillary Refill: Capillary refill takes less than 2 seconds.     Comments: No diaphoresis  Neurological:     Mental Status: He is alert.     Comments: No tremor  Psychiatric:        Mood and Affect: Mood normal.    ED Results / Procedures / Treatments   Labs (all labs ordered are listed, but only abnormal results are displayed) Labs Reviewed  CBC WITH DIFFERENTIAL/PLATELET - Abnormal; Notable for the following components:       Result Value   RBC 4.11 (*)    HCT 37.5 (*)    All other components within normal limits  COMPREHENSIVE METABOLIC PANEL  ETHANOL  RAPID URINE DRUG SCREEN, HOSP PERFORMED    EKG None  Radiology ECHOCARDIOGRAM COMPLETE  Result Date: 12/09/2022    ECHOCARDIOGRAM REPORT   Patient Name:   Derek Cruz Date of Exam: 12/09/2022 Medical Rec #:  [id number]                   Height:       68.0 in Accession #:    [id number]                  Weight:       168.0 lb Date of Birth:  1965-07-19                    BSA:          1.898 m Patient Age:    57 years                    BP:           133/95 mmHg Patient Gender: M                           HR:           84 bpm. Exam Location:  Inpatient Procedure: 2D Echo, Color Doppler and Cardiac Doppler Indications:    Chest Pain  History:        Patient has no prior history of Echocardiogram examinations.                 Signs/Symptoms:Chest Pain; Risk Factors:Dyslipidemia and                 Hypertension.  Sonographer:    Darlys Gales Referring Phys: [id number] ANASTASSIA DOUTOVA IMPRESSIONS  1. Left ventricular ejection fraction, by estimation, is 60 to 65%. The left ventricle has normal function. The left ventricle has no regional wall motion abnormalities. Left ventricular diastolic parameters were normal.  2. Right ventricular systolic function is normal. The right ventricular size is normal.  3. The mitral valve is normal in structure. Trivial mitral valve regurgitation. No evidence of mitral stenosis.  4. The aortic valve is tricuspid. Aortic valve regurgitation is not visualized. No aortic stenosis is present.  5. The inferior vena cava is normal in size with greater than 50% respiratory variability, suggesting right atrial pressure of 3 mmHg. Comparison(s): No prior Echocardiogram. FINDINGS  Left Ventricle: Left ventricular ejection fraction, by estimation, is 60 to 65%. The left ventricle has normal function. The left ventricle has no regional wall  motion  abnormalities. The left ventricular internal cavity size was normal in size. There is  no left ventricular hypertrophy. Left ventricular diastolic parameters were normal. Right Ventricle: The right ventricular size is normal. Right ventricular systolic function is normal. Left Atrium: Left atrial size was normal in size. Right Atrium: Right atrial size was normal in size. Pericardium: There is no evidence of pericardial effusion. Mitral Valve: The mitral valve is normal in structure. Trivial mitral valve regurgitation. No evidence of mitral valve stenosis. Tricuspid Valve: The tricuspid valve is normal in structure. Tricuspid valve regurgitation is trivial. No evidence of tricuspid stenosis. Aortic Valve: The aortic valve is tricuspid. Aortic valve regurgitation is not visualized. No aortic stenosis is present. Aortic valve mean gradient measures 4.0 mmHg. Aortic valve peak gradient measures 5.5 mmHg. Aortic valve area, by VTI measures 2.28 cm. Pulmonic Valve: The pulmonic valve was normal in structure. Pulmonic valve regurgitation is not visualized. No evidence of pulmonic stenosis. Aorta: The aortic root is normal in size and structure. Venous: The inferior vena cava is normal in size with greater than 50% respiratory variability, suggesting right atrial pressure of 3 mmHg. IAS/Shunts: No atrial level shunt detected by color flow Doppler.  LEFT VENTRICLE PLAX 2D LVIDd:         5.10 cm   Diastology LVIDs:         3.00 cm   LV e' medial:    6.31 cm/s LV PW:         0.80 cm   LV E/e' medial:  12.9 LV IVS:        0.80 cm   LV e' lateral:   11.90 cm/s LVOT diam:     1.80 cm   LV E/e' lateral: 6.9 LV SV:         58 LV SV Index:   31 LVOT Area:     2.54 cm  RIGHT VENTRICLE             IVC RV Basal diam:  3.70 cm     IVC diam: 1.70 cm RV Mid diam:    3.20 cm RV S prime:     10.40 cm/s TAPSE (M-mode): 2.3 cm LEFT ATRIUM             Index        RIGHT ATRIUM           Index LA Vol (A2C):   42.2 ml 22.24 ml/m  RA  Area:     12.60 cm LA Vol (A4C):   59.4 ml 31.30 ml/m  RA Volume:   23.80 ml  12.54 ml/m LA Biplane Vol: 53.4 ml 28.14 ml/m  AORTIC VALVE AV Area (Vmax):    2.41 cm AV Area (Vmean):   2.22 cm AV Area (VTI):     2.28 cm AV Vmax:           117.00 cm/s AV Vmean:          90.300 cm/s AV VTI:            0.256 m AV Peak Grad:      5.5 mmHg AV Mean Grad:      4.0 mmHg LVOT Vmax:         111.00 cm/s LVOT Vmean:        78.800 cm/s LVOT VTI:          0.229 m LVOT/AV VTI ratio: 0.89  AORTA Ao Root diam: 3.30 cm Ao Asc diam:  3.20 cm MITRAL VALVE  TRICUSPID VALVE MV Area (PHT): 3.42 cm    TR Peak grad:   18.8 mmHg MV Decel Time: 222 msec    TR Vmax:        217.00 cm/s MV E velocity: 81.60 cm/s MV A velocity: 70.00 cm/s  SHUNTS MV E/A ratio:  1.17        Systemic VTI:  0.23 m                            Systemic Diam: 1.80 cm Olga Millers MD Electronically signed by Olga Millers MD Signature Date/Time: 12/09/2022/11:56:59 AM    Final     Procedures Procedures  {Document cardiac monitor, telemetry assessment procedure when appropriate:1}  Medications Ordered in ED Medications - No data to display  ED Course/ Medical Decision Making/ A&P   {   Click here for ABCD2, HEART and other calculatorsREFRESH Note before signing :1}                          Medical Decision Making Amount and/or Complexity of Data Reviewed Labs: ordered.   Patient presents to the emergency room for medical clearance.  Patient does have history of alcohol withdrawal with reported seizures.  Initial CIWA score was 0.  No sign of withdrawal at this time.  Patient is resting comfortably.  I ordered and reviewed lab results.  {Document critical care time when appropriate:1} {Document review of labs and clinical decision tools ie heart score, Chads2Vasc2 etc:1}  {Document your independent review of radiology images, and any outside records:1} {Document your discussion with family members, caretakers, and with  consultants:1} {Document social determinants of health affecting pt's care:1} {Document your decision making why or why not admission, treatments were needed:1} Final Clinical Impression(s) / ED Diagnoses Final diagnoses:  None    Rx / DC Orders ED Discharge Orders     None

## 2022-12-10 NOTE — ED Provider Notes (Signed)
Behavioral Health Urgent Care Medical Screening Exam  Patient Name: Derek Cruz MRN: [id number] Date of Evaluation: 12/10/22 Chief Complaint:  "I want to stop" Diagnosis:  Final diagnoses:  Alcohol use disorder   History of Present Illness: Derek Cruz is a 57 y.o. male patient with a psychiatric history of chronic alcohol abuse, alcohol withdrawal with complications, and suicidal ideation and medical history significant for type 2 diabetes, hyperlipidemia, CKD stage III, alcoholic hepatitis, GERD, and hypertension who presented to [facility] voluntarily and accompanied by his son [name]) seeking alcohol detox. Patient requested for his son to remain in room and participate in assessment. Spanish interpreter utilized during assessment as patient is primarily Spanish-speaking but does speak some [place].   Patient assessed face-to-face by this provider, consulted with Dr. Lucianne Muss, and chart reviewed on 12/10/22. On evaluation, Derek Cruz is seated in assessment area in no acute distress. Patient is alert and oriented x4, cooperative and pleasant. Speech is clear and coherent, normal rate and volume. Eye contact is good. Mood is anxious with congruent affect. Patient appears tremulous and ambulates with a slow gait. Thought process is coherent with logical thought content. Patient denies suicidal and homicidal ideations and easily contracts verbally for safety. Patient denies a history of suicide attempts or self-harm. Patient reports a past psychiatric hospitalization at [facility] in May 2024 for alcohol detox. Per chart review, patient was admitted to [facility] from 12/01/22-12/06/22 related to a fall in the setting of alcohol use and alcohol withdrawal symptoms/alcohol poisoning. Patient was discharged on 12/06/22 with a Librium taper but did not take any of it. Per chart review, patient then presented to [facility] on 12/07/22 via EMS after apparently drinking 6  bottles of isopropyl alcohol in an effort to become intoxicated and was discharged on 12/08/22 as patient was not interested in stopping alcohol use and declined Librium taper. Per chart review, patient presented to [facility] on 12/08/22 seeking alcohol detox, was having chest pain and severe alcohol withdrawal symptoms (CIWA 33), transferred to [facility], admitted to the step down unit, and then discharged on 12/09/22 per his request with substance use treatment resources and instructions to continue Librium taper at home. It is documented that patient has a history of withdrawal seizures, severe DTs, ICU admissions, and intubations related to his alcohol use/withdrawal. Patient reports he immediately resumed drinking alcohol after being discharged from the hospital yesterday. Patient estimates he has consumed 3 "big cans" of beer since yesterday. Per [facility] triage screening "pt reports consuming two 12oz beers about 1 hour ago." Patient reports he did not continue his Librium taper at home after being discharged. Patient denies auditory and visual hallucinations. Patient denies symptoms of paranoia. Patient is able to converse coherently with goal-directed thoughts and no distractibility or preoccupation. Objectively, there is no evidence of psychosis/mania, delusional thinking, or indication that patient is responding to internal or external stimuli.  Patient reports fair sleep (6-8 hours/night) and good appetite. Patient lives with his wife, son, and 2 daughters in [place] and denies access to weapons/firearms. 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 Patient reports daily alcohol use for "most of my life." Patient reports typically consuming a 12-pack of beer daily, last use today. Patient denies current withdrawal symptoms although he appears anxious and tremulous throughout assessment. Patient denies use of other illicit substances. Patient denies having current  outpatient psychiatric services in place for therapy or medication management. Patient states he wants  to stop drinking alcohol and is interested in alcohol detox and a residential substance use treatment program.   Patient offered support and encouragement. Due to patient's significant history of alcohol withdrawal seizures and severe DTs, patient will be transferred to a higher level of care at [facility] for medical clearance and potential medical detox, accepting provider is Dr. Jacqulyn Bath. Nursing staff made aware and EMTALA completed. Patient is in agreement with plan of care and will be transported via [employer].   Flowsheet Row ED from 12/10/2022 in [facility] Most recent reading at 12/10/2022 12:41 PM ED from 12/08/2022 in [facility] Emergency Department at [facility] Most recent reading at 12/08/2022  2:46 PM ED from 12/08/2022 in [facility] Most recent reading at 12/08/2022  2:17 PM  C-SSRS RISK CATEGORY No Risk No Risk No Risk       Psychiatric Specialty Exam  Presentation  General Appearance:Appropriate for Environment  Eye Contact:Good  Speech:Clear and Coherent; Normal Rate  Speech Volume:Normal  Handedness:Right   Mood and Affect  Mood: Anxious  Affect: Congruent   Thought Process  Thought Processes: Coherent; Goal Directed  Descriptions of Associations:Intact  Orientation:Full (Time, Place and Person)  Thought Content:Logical  Diagnosis of Schizophrenia or Schizoaffective disorder in past: No   Hallucinations:None  Ideas of Reference:None  Suicidal Thoughts:No  Homicidal Thoughts:No   Sensorium  Memory: Immediate Fair; Recent Fair; Remote Fair  Judgment: Fair  Insight: Fair   Chartered certified accountant: Fair  Attention Span: Fair  Recall: [employer] of Knowledge: Fair  Language: Fair   Psychomotor Activity  Psychomotor Activity: Tremor   Assets   Assets: Manufacturing systems engineer; Desire for Improvement; Financial Resources/Insurance; Housing; Leisure Time; Physical Health; Resilience; Social Support; Transportation   Sleep  Sleep: Fair  Number of hours:  6-8   Physical Exam: Physical Exam Vitals and nursing note reviewed.  Constitutional:      General: He is not in acute distress.    Appearance: Normal appearance. He is not ill-appearing.  HENT:     Head: Normocephalic and atraumatic.     Nose: Nose normal.  Eyes:     General:        Right eye: No discharge.        Left eye: No discharge.     Conjunctiva/sclera: Conjunctivae normal.  Cardiovascular:     Rate and Rhythm: Normal rate.  Pulmonary:     Effort: Pulmonary effort is normal. No respiratory distress.  Musculoskeletal:        General: Normal range of motion.     Cervical back: Normal range of motion.  Skin:    General: Skin is warm and dry.  Neurological:     General: No focal deficit present.     Mental Status: He is alert and oriented to person, place, and time. Mental status is at baseline.  Psychiatric:        Attention and Perception: Attention and perception normal.        Mood and Affect: Mood is anxious.        Speech: Speech normal.        Behavior: Behavior normal. Behavior is cooperative.        Thought Content: Thought content normal. Thought content is not paranoid or delusional. Thought content does not include homicidal or suicidal ideation. Thought content does not include homicidal or suicidal plan.        Cognition and Memory: Cognition and memory normal.  Judgment: Judgment normal.     Comments: Affect: Congruent    Review of Systems  Constitutional: Negative.   HENT: Negative.    Eyes: Negative.   Respiratory: Negative.    Cardiovascular: Negative.   Gastrointestinal: Negative.   Genitourinary: Negative.   Musculoskeletal: Negative.   Skin: Negative.   Neurological: Negative.   Endo/Heme/Allergies: Negative.    Psychiatric/Behavioral:  Positive for substance abuse. Negative for depression, hallucinations, memory loss and suicidal ideas. The patient is nervous/anxious. The patient does not have insomnia.    Blood pressure (!) 142/87, pulse 85, temperature 98.5 F (36.9 C), resp. rate 19, SpO2 99%. There is no height or weight on file to calculate BMI.  Musculoskeletal: Strength & Muscle Tone: within normal limits Gait & Station: normal Patient leans: N/A   [facility] MSE Discharge Disposition for Follow up and Recommendations: Based on my evaluation the patient appears to have an emergency medical condition for which I recommend the patient be transferred to the emergency department for further evaluation.   Transfer to [facility] for medical clearance related to alcohol use and potential medical detox, accepting provider is Dr. Jacqulyn Bath.  Sunday Corn, NP 12/10/2022, 2:49 PM

## 2022-12-11 ENCOUNTER — Encounter (HOSPITAL_COMMUNITY): Payer: Self-pay

## 2022-12-11 ENCOUNTER — Other Ambulatory Visit (HOSPITAL_COMMUNITY)
Admission: EM | Admit: 2022-12-11 | Discharge: 2022-12-14 | Disposition: A | Discharge status: Home / Self Care | Payer: No Payment, Other | Attending: Psychiatry | Admitting: Psychiatry

## 2022-12-11 DIAGNOSIS — E1165 Type 2 diabetes mellitus with hyperglycemia: Secondary | ICD-10-CM

## 2022-12-11 DIAGNOSIS — I10 Essential (primary) hypertension: Secondary | ICD-10-CM | POA: Diagnosis not present

## 2022-12-11 DIAGNOSIS — Z79899 Other long term (current) drug therapy: Secondary | ICD-10-CM | POA: Insufficient documentation

## 2022-12-11 DIAGNOSIS — E785 Hyperlipidemia, unspecified: Secondary | ICD-10-CM | POA: Diagnosis not present

## 2022-12-11 DIAGNOSIS — Z739 Problem related to life management difficulty, unspecified: Secondary | ICD-10-CM | POA: Insufficient documentation

## 2022-12-11 DIAGNOSIS — F1010 Alcohol abuse, uncomplicated: Secondary | ICD-10-CM | POA: Diagnosis not present

## 2022-12-11 DIAGNOSIS — E119 Type 2 diabetes mellitus without complications: Secondary | ICD-10-CM | POA: Diagnosis not present

## 2022-12-11 DIAGNOSIS — R4589 Other symptoms and signs involving emotional state: Secondary | ICD-10-CM

## 2022-12-11 DIAGNOSIS — Z7984 Long term (current) use of oral hypoglycemic drugs: Secondary | ICD-10-CM | POA: Insufficient documentation

## 2022-12-11 LAB — GLUCOSE, CAPILLARY
Glucose-Capillary: 107 mg/dL — ABNORMAL HIGH (ref 70–99)
Glucose-Capillary: 192 mg/dL — ABNORMAL HIGH (ref 70–99)
Glucose-Capillary: 155 mg/dL — ABNORMAL HIGH (ref 70–99)

## 2022-12-11 LAB — LIPOPROTEIN A (LPA): Lipoprotein (a): 47.5 nmol/L — ABNORMAL HIGH (ref <75.0)

## 2022-12-11 MED ORDER — THIAMINE MONONITRATE 100 MG PO TABS
100.0000 mg | ORAL_TABLET | Freq: Every day | ORAL | Status: DC
  Administered 2022-12-12 – 2022-12-14 (×3): 100 mg via ORAL
  Filled 2022-12-11 (×3): qty 1

## 2022-12-11 MED ORDER — INSULIN ASPART 100 UNIT/ML IJ SOLN: 0.0000 [IU] | Freq: Every day | INTRAMUSCULAR | Status: DC

## 2022-12-11 MED ORDER — LORAZEPAM 1 MG PO TABS: 1.0000 mg | ORAL_TABLET | Freq: Four times a day (QID) | ORAL | Status: AC | PRN

## 2022-12-11 MED ORDER — THIAMINE HCL 100 MG/ML IJ SOLN
100.0000 mg | Freq: Once | INTRAMUSCULAR | Status: AC
  Administered 2022-12-11: 100 mg via INTRAMUSCULAR
  Filled 2022-12-11: qty 2

## 2022-12-11 MED ORDER — LORAZEPAM 1 MG PO TABS
1.0000 mg | ORAL_TABLET | Freq: Four times a day (QID) | ORAL | Status: AC
  Administered 2022-12-11 – 2022-12-12 (×6): 1 mg via ORAL
  Filled 2022-12-11 (×6): qty 1

## 2022-12-11 MED ORDER — METFORMIN HCL ER 750 MG PO TB24: 1500.0000 mg | ORAL_TABLET | Freq: Every day | ORAL | Status: DC

## 2022-12-11 MED ORDER — ZIPRASIDONE MESYLATE 20 MG IM SOLR: 20.0000 mg | INTRAMUSCULAR | Status: DC | PRN

## 2022-12-11 MED ORDER — INSULIN GLARGINE-YFGN 100 UNIT/ML ~~LOC~~ SOLN: 18.0000 [IU] | Freq: Every day | SUBCUTANEOUS | Status: DC

## 2022-12-11 MED ORDER — OLANZAPINE 10 MG PO TBDP: 10.0000 mg | ORAL_TABLET | Freq: Three times a day (TID) | ORAL | Status: DC | PRN

## 2022-12-11 MED ORDER — INSULIN ASPART 100 UNIT/ML IJ SOLN: 0.0000 [IU] | Freq: Three times a day (TID) | INTRAMUSCULAR | Status: DC

## 2022-12-11 MED ORDER — LORAZEPAM 1 MG PO TABS: 1.0000 mg | ORAL_TABLET | Freq: Every day | ORAL | Status: DC

## 2022-12-11 MED ORDER — MAGNESIUM HYDROXIDE 400 MG/5ML PO SUSP: 30.0000 mL | Freq: Every day | ORAL | Status: DC | PRN

## 2022-12-11 MED ORDER — HYDROXYZINE HCL 25 MG PO TABS: 25.0000 mg | ORAL_TABLET | Freq: Four times a day (QID) | ORAL | Status: AC | PRN

## 2022-12-11 MED ORDER — ALUM & MAG HYDROXIDE-SIMETH 200-200-20 MG/5ML PO SUSP: 30.0000 mL | ORAL | Status: DC | PRN

## 2022-12-11 MED ORDER — LORAZEPAM 1 MG PO TABS
1.0000 mg | ORAL_TABLET | Freq: Three times a day (TID) | ORAL | Status: AC
  Administered 2022-12-12 – 2022-12-13 (×3): 1 mg via ORAL
  Filled 2022-12-11 (×3): qty 1

## 2022-12-11 MED ORDER — LOPERAMIDE HCL 2 MG PO CAPS: 2.0000 mg | ORAL_CAPSULE | ORAL | Status: AC | PRN

## 2022-12-11 MED ORDER — ONDANSETRON 4 MG PO TBDP: 4.0000 mg | ORAL_TABLET | Freq: Four times a day (QID) | ORAL | Status: AC | PRN

## 2022-12-11 MED ORDER — LORAZEPAM 1 MG PO TABS: 1.0000 mg | ORAL_TABLET | ORAL | Status: DC | PRN

## 2022-12-11 MED ORDER — METFORMIN HCL ER 750 MG PO TB24
1500.0000 mg | ORAL_TABLET | Freq: Every day | ORAL | Status: DC
  Administered 2022-12-11 – 2022-12-13 (×3): 1500 mg via ORAL
  Filled 2022-12-11 (×3): qty 2

## 2022-12-11 MED ORDER — ACETAMINOPHEN 325 MG PO TABS: 650.0000 mg | ORAL_TABLET | Freq: Four times a day (QID) | ORAL | Status: DC | PRN

## 2022-12-11 MED ORDER — LORAZEPAM 1 MG PO TABS
1.0000 mg | ORAL_TABLET | Freq: Two times a day (BID) | ORAL | Status: AC
  Administered 2022-12-13 – 2022-12-14 (×2): 1 mg via ORAL
  Filled 2022-12-11 (×2): qty 1

## 2022-12-11 MED ORDER — ADULT MULTIVITAMIN W/MINERALS CH
1.0000 | ORAL_TABLET | Freq: Every day | ORAL | Status: DC
  Administered 2022-12-11 – 2022-12-14 (×4): 1 via ORAL
  Filled 2022-12-11 (×4): qty 1

## 2022-12-11 NOTE — ED Notes (Signed)
Report to [facility] nurse Wyvonnia Dusky, RN. Safe transport called for patient pickup.

## 2022-12-11 NOTE — Transitions of Care (Post Inpatient/ED Visit) (Unsigned)
   12/11/2022  Name: Darral Rishel MRN: [id number] DOB: Jan 13, 1966  Today's TOC FU Call Status: Today's TOC FU Call Status:: Unsuccessful Call (2nd Attempt) Unsuccessful Call (1st Attempt) Date: 12/10/22 Unsuccessful Call (2nd Attempt) Date: 12/11/22  Attempted to reach the patient regarding the most recent Inpatient/ED visit.  Follow Up Plan: Additional outreach attempts will be made to reach the patient to complete the Transitions of Care (Post Inpatient/ED visit) call.   Signature   Woodfin Ganja LPN [facility] Nurse Health Advisor Direct Dial [phone]

## 2022-12-11 NOTE — ED Notes (Signed)
Patient observed/assessed at nursing station. Patient alert and oriented x 4. Affect is bright. Patient denies pain and anxiety. He denies A/V/H. He denies having any thoughts/plan of self harm and harm towards others. Fluid and snack offered. Patient states that appetite has been good throughout the day. Verbalizes no further complaints at this time. Will continue to monitor and support.  

## 2022-12-11 NOTE — Progress Notes (Signed)
Pt's CIWA was 2. °

## 2022-12-11 NOTE — ED Provider Notes (Incomplete)
Behavioral Health Progress Note  Date and Time: 12/11/2022 11:27 AM Name: Derek Cruz MRN:  [id number]  Subjective:  Derek Cruz is a 57 year old male with previous medical history of alcohol use disorder with history of seizures, DTs, intubation, and ICU admission during withdrawal; HTN, HLD, and DM type 2. Came to [facility] with the goal of alcohol detox and admission to a residential treatment facility.   He was initially seen in his room sleeping, dressed causally, was calm and pleasant during the interview. Patient stated that he felt comfortable with the interview being conducted in [place]. He stated that he felt "good" this morning, and that he felt good last night. He wants to stop drinking because his wife and children want him to stop. He drinks a six pack of Coors light every day. This makes him relaxed and he noted that he has fallen a few times while walking after drinking. His most recent alcoholic drink was 2 days ago, a six pack of coors light. He started drinking at 58. He was most recently sober 3 months ago which lasted 3 weeks. 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 He denies any other substance use including nicotine products, cannabis, heroin, cocaine, and benzodiazepines.  He denies any current SI/HI/AVH/and tactile hallucinations.  He also states that he takes metformin at home for his diabetes and would like to restart it instead of taking insulin.  While patient responded appropriately to questions the team has concerns that he may have difficulty understanding inpatient programming that is not provided in Spanish. Use of an interpreter is highly recommended for future interviews. The patient consented to the team contacting his son for collateral.    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 Lives in [place] with his wife and 3 kids; son - 71 years old, daughter - 28 years old, daughter 16 years old. He denies having any legal issues related to his drinking.   Additional Social History:    Pain Medications: see MAR Prescriptions: see MAR Over the Counter: see MAR History of alcohol / drug use?: Yes Longest period of sobriety (when/how long): none reported Negative Consequences of Use: Personal relationships Withdrawal Symptoms: Blackouts, Irritability, Nausea / Vomiting, Patient aware of relationship between substance abuse and physical/medical complications, Tremors, Weakness Name of Substance 1: alcohol 1 - Age of First Use: "all my life" 1 - Amount (size/oz): 12 pack 1 - Frequency: daily 1 - Duration: ongoing 1 - Last Use / Amount: yesterday 1 - Method of Aquiring: store purchase                  Sleep: Good  Appetite:  Fair  Current Medications:  Current Facility-Administered Medications  Medication Dose Route Frequency Provider Last Rate Last Admin   acetaminophen (TYLENOL) tablet 650 mg  650 mg Oral Q6H PRN [name], NP       alum & mag hydroxide-simeth (MAALOX/MYLANTA) 200-200-20 MG/5ML suspension 30 mL  30 mL Oral Q4H PRN [name], NP  hydrOXYzine (ATARAX) tablet 25 mg  25 mg Oral Q6H PRN [name], NP       insulin aspart (novoLOG) injection 0-15 Units  0-15 Units Subcutaneous TID WC Tomie China, MD       insulin aspart (novoLOG) injection 0-5 Units  0-5 Units Subcutaneous QHS Tomie China, MD       insulin glargine-yfgn [facility]) injection 18 Units  18 Units Subcutaneous QHS Tomie China, MD       loperamide (IMODIUM) capsule 2-4  mg  2-4 mg Oral PRN [name], NP       LORazepam (ATIVAN) tablet 1 mg  1 mg Oral Q6H PRN [name], NP       OLANZapine zydis (ZYPREXA) disintegrating tablet 10 mg  10 mg Oral Q8H PRN [name], NP       And   LORazepam (ATIVAN) tablet 1 mg  1 mg Oral PRN [name], NP       And   ziprasidone (GEODON) injection 20 mg  20 mg Intramuscular PRN [name], NP       LORazepam (ATIVAN) tablet 1 mg  1 mg Oral QID [name], NP   1 mg at 12/11/22 6213   Followed by   Melene Muller ON 12/12/2022] LORazepam (ATIVAN) tablet 1 mg  1 mg Oral TID [name], NP       Followed by   Melene Muller ON 12/13/2022] LORazepam (ATIVAN) tablet 1 mg  1 mg Oral BID [name], NP       Followed by   Melene Muller ON 12/15/2022] LORazepam (ATIVAN) tablet 1 mg  1 mg Oral Daily [name], NP       magnesium hydroxide (MILK OF MAGNESIA) suspension 30 mL  30 mL Oral Daily PRN [name], NP       multivitamin with minerals tablet 1 tablet  1 tablet Oral Daily [name], NP   1 tablet at 12/11/22 0912   ondansetron (ZOFRAN-ODT) disintegrating tablet 4 mg  4 mg Oral Q6H PRN [name], NP       Melene Muller ON 12/12/2022] thiamine (VITAMIN B1) tablet 100 mg  100 mg Oral Daily [name], NP       Current Outpatient Medications  Medication Sig Dispense Refill   chlordiazePOXIDE (LIBRIUM) 10 MG capsule Take 1 capsule (10 mg total) by mouth 4 (four) times daily for 3 days, THEN 1 capsule (10 mg total) 3 (three) times daily for 3 days, THEN 1 capsule (10 mg total) 2 (two) times daily for 3 days, THEN 1 capsule (10 mg total) daily for 3 days. (Patient not taking: Reported on 12/10/2022) 30 capsule 0   folic acid (FOLVITE) 1 MG tablet Take 1 tablet (1 mg total) by mouth daily. 30 tablet 0   metFORMIN (GLUCOPHAGE-XR) 750 MG 24 hr tablet Take 2 tablets (1,500 mg total) by mouth daily with breakfast. (Patient not taking: Reported on 12/08/2022) 60 tablet 0   Multiple Vitamin (MULTIVITAMIN WITH MINERALS) TABS tablet  Take 1 tablet by mouth daily. 130 tablet 0   thiamine (VITAMIN B1) 100 MG tablet Take 1 tablet (100 mg total) by mouth daily. 30 tablet 0    Labs  Lab Results:  Admission on 12/11/2022  Component Date Value Ref Range Status   Glucose-Capillary 12/11/2022 107 (H)  70 - 99 mg/dL Final   Glucose reference range applies only to samples taken after fasting for at least 8 hours.  Admission on 12/10/2022, Discharged on 12/11/2022  Component Date Value Ref Range  Status   Sodium 12/10/2022 134 (L)  135 - 145 mmol/L Final   Potassium 12/10/2022 3.6  3.5 - 5.1 mmol/L Final   Chloride 12/10/2022 99  98 - 111 mmol/L Final   CO2 12/10/2022 25  22 - 32 mmol/L Final   Glucose, Bld 12/10/2022 177 (H)  70 - 99 mg/dL Final   Glucose reference range applies only to samples taken after fasting for at least 8 hours.   BUN 12/10/2022 13  6 - 20 mg/dL Final   Creatinine, Ser 12/10/2022 0.78  0.61 - 1.24 mg/dL Final   Calcium 16/02/9603 8.8 (L)  8.9 - 10.3 mg/dL Final   Total Protein 54/01/8118 6.7  6.5 - 8.1 g/dL Final   Albumin 14/78/2956 3.6  3.5 - 5.0 g/dL Final   AST 21/30/8657 33  15 - 41 U/L Final   ALT 12/10/2022 48 (H)  0 - 44 U/L Final   Alkaline Phosphatase 12/10/2022 81  38 - 126 U/L Final   Total Bilirubin 12/10/2022 0.7  0.3 - 1.2 mg/dL Final   GFR, Estimated 12/10/2022 >60  >60 mL/min Final   Comment: (NOTE) Calculated using the CKD-EPI Creatinine Equation (2021)    Anion gap 12/10/2022 10  5 - 15 Final   Performed at [facility] Lab, [address], [place], [place] [address]   Alcohol, Ethyl (B) 12/10/2022 <10  <10 mg/dL Final   Comment: (NOTE) Lowest detectable limit for serum alcohol is 10 mg/dL.  For medical purposes only. Performed at [facility] Lab, [address], [place], [place] [address]    Opiates 12/10/2022 NONE DETECTED  NONE DETECTED Final   Cocaine 12/10/2022 NONE DETECTED  NONE DETECTED Final   Benzodiazepines 12/10/2022 POSITIVE (A)  NONE DETECTED Final    Amphetamines 12/10/2022 NONE DETECTED  NONE DETECTED Final   Tetrahydrocannabinol 12/10/2022 NONE DETECTED  NONE DETECTED Final   Barbiturates 12/10/2022 POSITIVE (A)  NONE DETECTED Final   Comment: (NOTE) DRUG SCREEN FOR MEDICAL PURPOSES ONLY.  IF CONFIRMATION IS NEEDED FOR ANY PURPOSE, NOTIFY LAB WITHIN 5 DAYS.  LOWEST DETECTABLE LIMITS FOR URINE DRUG SCREEN Drug Class                     Cutoff (ng/mL) Amphetamine and metabolites    1000 Barbiturate and metabolites    200 Benzodiazepine                 200 Opiates and metabolites        300 Cocaine and metabolites        300 THC                            50 Performed at [facility] Lab, [address], [place], [place] [address]    WBC 12/10/2022 5.1  4.0 - 10.5 K/uL Final   RBC 12/10/2022 4.11 (L)  4.22 - 5.81 MIL/uL Final   Hemoglobin 12/10/2022 13.0  13.0 - 17.0 g/dL Final   HCT 40/02/2724 37.5 (L)  39.0 - 52.0 % Final   MCV 12/10/2022 91.2  80.0 - 100.0 fL Final   MCH 12/10/2022 31.6  26.0 - 34.0 pg Final   MCHC 12/10/2022 34.7  30.0 - 36.0 g/dL Final   RDW 36/64/4034 13.5  11.5 - 15.5 % Final   Platelets 12/10/2022 249  150 - 400 K/uL Final   nRBC 12/10/2022 0.0  0.0 - 0.2 % Final   Neutrophils  Relative % 12/10/2022 50  % Final   Neutro Abs 12/10/2022 2.6  1.7 - 7.7 K/uL Final   Lymphocytes Relative 12/10/2022 31  % Final   Lymphs Abs 12/10/2022 1.6  0.7 - 4.0 K/uL Final   Monocytes Relative 12/10/2022 13  % Final   Monocytes Absolute 12/10/2022 0.7  0.1 - 1.0 K/uL Final   Eosinophils Relative 12/10/2022 5  % Final   Eosinophils Absolute 12/10/2022 0.2  0.0 - 0.5 K/uL Final   Basophils Relative 12/10/2022 1  % Final   Basophils Absolute 12/10/2022 0.1  0.0 - 0.1 K/uL Final   Immature Granulocytes 12/10/2022 0  % Final   Abs Immature Granulocytes 12/10/2022 0.01  0.00 - 0.07 K/uL Final   Performed at [facility] Lab, [address] [place], [place] [address]  Admission on 12/10/2022, Discharged on 12/10/2022   Component Date Value Ref Range Status   Sodium 12/10/2022 134 (L)  135 - 145 mmol/L Final   Potassium 12/10/2022 4.0  3.5 - 5.1 mmol/L Final   Chloride 12/10/2022 103  98 - 111 mmol/L Final   CO2 12/10/2022 20 (L)  22 - 32 mmol/L Final   Glucose, Bld 12/10/2022 97  70 - 99 mg/dL Final   Glucose reference range applies only to samples taken after fasting for at least 8 hours.   BUN 12/10/2022 10  6 - 20 mg/dL Final   Creatinine, Ser 12/10/2022 0.67  0.61 - 1.24 mg/dL Final   Calcium 09/81/1914 9.0  8.9 - 10.3 mg/dL Final   Total Protein 78/29/5621 7.4  6.5 - 8.1 g/dL Final   Albumin 30/86/5784 4.0  3.5 - 5.0 g/dL Final   AST 69/62/9528 42 (H)  15 - 41 U/L Final   ALT 12/10/2022 56 (H)  0 - 44 U/L Final   Alkaline Phosphatase 12/10/2022 77  38 - 126 U/L Final   Total Bilirubin 12/10/2022 0.7  0.3 - 1.2 mg/dL Final   GFR, Estimated 12/10/2022 >60  >60 mL/min Final   Comment: (NOTE) Calculated using the CKD-EPI Creatinine Equation (2021)    Anion gap 12/10/2022 11  5 - 15 Final   Performed at [facility], [address], [place], [place] [address]   Alcohol, Ethyl (B) 12/10/2022 <10  <10 mg/dL Final   Comment: (NOTE) Lowest detectable limit for serum alcohol is 10 mg/dL.  For medical purposes only. Performed at [facility], [address] [place], [place] [address]    WBC 12/10/2022 5.3  4.0 - 10.5 K/uL Final   RBC 12/10/2022 4.49  4.22 - 5.81 MIL/uL Final   Hemoglobin 12/10/2022 14.3  13.0 - 17.0 g/dL Final   HCT 72/53/6644 41.3  39.0 - 52.0 % Final   MCV 12/10/2022 92.0  80.0 - 100.0 fL Final   MCH 12/10/2022 31.8  26.0 - 34.0 pg Final   MCHC 12/10/2022 34.6  30.0 - 36.0 g/dL Final   RDW 03/47/4259 13.8  11.5 - 15.5 % Final   Platelets 12/10/2022 251  150 - 400 K/uL Final   nRBC 12/10/2022 0.0  0.0 - 0.2 % Final   Performed at [facility], [address], [place], [place] [address]   Opiates 12/10/2022 NONE DETECTED  NONE DETECTED Final   Cocaine  12/10/2022 NONE DETECTED  NONE DETECTED Final   Benzodiazepines 12/10/2022 POSITIVE (A)  NONE DETECTED Final   Amphetamines 12/10/2022 NONE DETECTED  NONE DETECTED Final   Tetrahydrocannabinol 12/10/2022 NONE DETECTED  NONE  DETECTED Final   Barbiturates 12/10/2022 POSITIVE (A)  NONE DETECTED Final   Comment: (NOTE) DRUG SCREEN FOR MEDICAL PURPOSES ONLY.  IF CONFIRMATION IS NEEDED FOR ANY PURPOSE, NOTIFY LAB WITHIN 5 DAYS.  LOWEST DETECTABLE LIMITS FOR URINE DRUG SCREEN Drug Class                     Cutoff (ng/mL) Amphetamine and metabolites    1000 Barbiturate and metabolites    200 Benzodiazepine                 200 Opiates and metabolites        300 Cocaine and metabolites        300 THC                            50 Performed at [facility] Lab, [address], [place], [place] [address]   Admission on 12/08/2022, Discharged on 12/09/2022  Component Date Value Ref Range Status   WBC 12/08/2022 6.6  4.0 - 10.5 K/uL Final   RBC 12/08/2022 4.10 (L)  4.22 - 5.81 MIL/uL Final   Hemoglobin 12/08/2022 13.4  13.0 - 17.0 g/dL Final   HCT 95/28/4132 38.2 (L)  39.0 - 52.0 % Final   MCV 12/08/2022 93.2  80.0 - 100.0 fL Final   MCH 12/08/2022 32.7  26.0 - 34.0 pg Final   MCHC 12/08/2022 35.1  30.0 - 36.0 g/dL Final   RDW 44/05/270 14.6  11.5 - 15.5 % Final   Platelets 12/08/2022 209  150 - 400 K/uL Final   nRBC 12/08/2022 0.0  0.0 - 0.2 % Final   Performed at [facility] Lab, [address] [place], [place] [address]   Troponin I (High Sensitivity) 12/08/2022 6  <18 ng/L Final   Comment: (NOTE) Elevated high sensitivity troponin I (hsTnI) values and significant  changes across serial measurements may suggest ACS but many other  chronic and acute conditions are known to elevate hsTnI results.  Refer to the "Links" section for chest pain algorithms and additional  guidance. Performed at [facility] Lab, [address], [place], [place] [address]    Sodium 12/08/2022 140   135 - 145 mmol/L Final   Potassium 12/08/2022 3.2 (L)  3.5 - 5.1 mmol/L Final   Chloride 12/08/2022 108  98 - 111 mmol/L Final   CO2 12/08/2022 19 (L)  22 - 32 mmol/L Final   Glucose, Bld 12/08/2022 147 (H)  70 - 99 mg/dL Final   Glucose reference range applies only to samples taken after fasting for at least 8 hours.   BUN 12/08/2022 <5 (L)  6 - 20 mg/dL Final   Creatinine, Ser 12/08/2022 1.22  0.61 - 1.24 mg/dL Final   Calcium 42/59/5638 8.6 (L)  8.9 - 10.3 mg/dL Final   Total Protein 75/64/3329 7.2  6.5 - 8.1 g/dL Final   Albumin 51/88/4166 3.8  3.5 - 5.0 g/dL Final   AST 11/18/1599 40  15 - 41 U/L Final   ALT 12/08/2022 61 (H)  0 - 44 U/L Final   Alkaline Phosphatase 12/08/2022 75  38 - 126 U/L Final   Total Bilirubin 12/08/2022 0.5  0.3 - 1.2 mg/dL Final   GFR, Estimated 12/08/2022 >60  >60 mL/min Final   Comment: (NOTE) Calculated using the CKD-EPI Creatinine Equation (2021)    Anion gap 12/08/2022 13  5 - 15 Final   Performed at  [facility] Lab, [address] [place], [place], [place] [address]   Lipase 12/08/2022 28  11 - 51 U/L Final   Performed at [facility], [address], [place], [place] [address]   Alcohol, Ethyl (B) 12/08/2022 48 (H)  <10 mg/dL Final   Comment: (NOTE) Lowest detectable limit for serum alcohol is 10 mg/dL.  For medical purposes only. Performed at [facility], [address], [place], [place] [address]    Troponin I (High Sensitivity) 12/08/2022 6  <18 ng/L Final   Comment: (NOTE) Elevated high sensitivity troponin I (hsTnI) values and significant  changes across serial measurements may suggest ACS but many other  chronic and acute conditions are known to elevate hsTnI results.  Refer to the "Links" section for chest pain algorithms and additional  guidance. Performed at [facility] Lab, [address], [place], [place] [address]    Total CK 12/08/2022 238  49 - 397 U/L Final   Performed at [facility], [address] [place], [place] [address]   Magnesium 12/08/2022 2.1  1.7 - 2.4 mg/dL Final   Performed at [facility], [address] [place], [place] [address]   Phosphorus 12/08/2022 3.1  2.5 - 4.6 mg/dL Final   Performed at [facility], [address], [place], [place] [address]   Prothrombin Time 12/08/2022 15.0  11.4 - 15.2 seconds Final   INR 12/08/2022 1.2  0.8 - 1.2 Final   Comment: (NOTE) INR goal varies based on device and disease states. Performed at [facility], [address] [place], [place] [address]    Prealbumin 12/09/2022 26  18 - 38 mg/dL Final   Performed at [facility], [address] [place] [address], [place], [place] [address]   Total Protein 12/08/2022 6.0 (L)  6.5 - 8.1 g/dL Final   Albumin 23/55/7322 3.1 (L)  3.5 - 5.0 g/dL Final   AST 02/54/2706 29  15 - 41 U/L Final   ALT 12/08/2022 49 (H)  0 - 44 U/L Final   Alkaline Phosphatase 12/08/2022 58  38 - 126 U/L Final   Total Bilirubin 12/08/2022 0.5  0.3 - 1.2 mg/dL Final   Bilirubin, Direct 12/08/2022 <0.1  0.0 - 0.2 mg/dL Final   Indirect Bilirubin 12/08/2022 NOT CALCULATED  0.3 - 0.9 mg/dL Final   Performed at [facility], [address] [place], [place] [address]   Ammonia 12/08/2022 16  9 - 35 umol/L Final   Performed at [facility] Lab, [address], [place], [place] [address]   TSH 12/08/2022 0.172 (L)  0.350 - 4.500 uIU/mL Final   Comment: Performed by a 3rd Generation assay with a functional sensitivity of <=0.01 uIU/mL. Performed at [facility] Lab, [address], [place], [place] [address]    Osmolality, Ur 12/08/2022 442  300 - 900 mOsm/kg Final   Performed at [facility], [address], [place], [place] [address]   Creatinine, Urine 12/08/2022 59  mg/dL Final   Performed at [facility], [address] [place], [place] [address]   Sodium, Ur 12/08/2022 123  mmol/L Final   Performed at [facility], [address] [place], [place] [address]   Sodium 12/08/2022  138  135 - 145 mmol/L Final   Potassium 12/08/2022 3.4 (L)  3.5 - 5.1 mmol/L Final   Chloride 12/08/2022 108  98 - 111 mmol/L  Final   CO2 12/08/2022 23  22 - 32 mmol/L Final   Glucose, Bld 12/08/2022 125 (H)  70 - 99 mg/dL Final   Glucose reference range applies only to samples taken after fasting for at least 8 hours.   BUN 12/08/2022 <5 (L)  6 - 20 mg/dL Final   Creatinine, Ser 12/08/2022 1.13  0.61 - 1.24 mg/dL Final   Calcium 14/78/2956 8.3 (L)  8.9 - 10.3 mg/dL Final   GFR, Estimated 12/08/2022 >60  >60 mL/min Final   Comment: (NOTE) Calculated using the CKD-EPI Creatinine Equation (2021)    Anion gap 12/08/2022 7  5 - 15 Final   Performed at [facility], [address], [place], [place] [address]   Osmolality 12/08/2022 320 (H)  275 - 295 mOsm/kg Final   Performed at [facility], [address], [place], [place] [address]   Magnesium 12/09/2022 2.1  1.7 - 2.4 mg/dL Final   Performed at [facility], [address], [place], [place] [address]   Phosphorus 12/09/2022 3.7  2.5 - 4.6 mg/dL Final   Performed at [facility] Lab, [address] [place] [address], [place], [place] [address]   Sodium 12/09/2022 138  135 - 145 mmol/L Final   Potassium 12/09/2022 3.3 (L)  3.5 - 5.1 mmol/L Final   Chloride 12/09/2022 108  98 - 111 mmol/L Final   CO2 12/09/2022 23  22 - 32 mmol/L Final   Glucose, Bld 12/09/2022 132 (H)  70 - 99 mg/dL Final   Glucose reference range applies only to samples taken after fasting for at least 8 hours.   BUN 12/09/2022 <5 (L)  6 - 20 mg/dL Final   Creatinine, Ser 12/09/2022 1.08  0.61 - 1.24 mg/dL Final   Calcium 32/44/0102 8.8 (L)  8.9 - 10.3 mg/dL Final   Total Protein 72/53/6644 6.5  6.5 - 8.1 g/dL Final   Albumin 03/47/4259 3.4 (L)  3.5 - 5.0 g/dL Final   AST 56/38/7564 31  15 - 41 U/L Final   ALT 12/09/2022 49 (H)  0 - 44 U/L Final   Alkaline Phosphatase 12/09/2022 66  38 - 126 U/L Final   Total Bilirubin 12/09/2022 0.5  0.3 - 1.2 mg/dL Final   GFR,  Estimated 12/09/2022 >60  >60 mL/min Final   Comment: (NOTE) Calculated using the CKD-EPI Creatinine Equation (2021)    Anion gap 12/09/2022 7  5 - 15 Final   Performed at [facility], [address], [place], [place] [address]   WBC 12/09/2022 5.3  4.0 - 10.5 K/uL Final   RBC 12/09/2022 3.69 (L)  4.22 - 5.81 MIL/uL Final   Hemoglobin 12/09/2022 12.1 (L)  13.0 - 17.0 g/dL Final   HCT 18/84/1660 34.6 (L)  39.0 - 52.0 % Final   MCV 12/09/2022 93.8  80.0 - 100.0 fL Final   MCH 12/09/2022 32.8  26.0 - 34.0 pg Final   MCHC 12/09/2022 35.0  30.0 - 36.0 g/dL Final   RDW 63/05/6008 14.6  11.5 - 15.5 % Final   Platelets 12/09/2022 194  150 - 400 K/uL Final   nRBC 12/09/2022 0.0  0.0 - 0.2 % Final   Performed at [facility] Lab, [address] [place], [place] [address]   Weight 12/09/2022 2,688  oz Final   Height 12/09/2022 68  in Final   BP 12/09/2022 133/95  mmHg Final   S' Lateral 12/09/2022 3.00  cm Final   AR  max vel 12/09/2022 2.41  cm2 Final   AV Area VTI 12/09/2022 2.28  cm2 Final   AV Mean grad 12/09/2022 4.0  mmHg Final   AV Peak grad 12/09/2022 5.5  mmHg Final   Ao pk vel 12/09/2022 1.17  m/s Final   Area-P 1/2 12/09/2022 3.42  cm2 Final   AV Area mean vel 12/09/2022 2.22  cm2 Final   Est EF 12/09/2022 60 - 65%   Final   Beta-Hydroxybutyric Acid 12/08/2022 0.10  0.05 - 0.27 mmol/L Final   Performed at [facility] Lab, [address] [place], [place] [address]   Glucose-Capillary 12/08/2022 120 (H)  70 - 99 mg/dL Final   Glucose reference range applies only to samples taken after fasting for at least 8 hours.   Cholesterol 12/09/2022 183  0 - 200 mg/dL Final   Triglycerides 62/13/0865 91  <150 mg/dL Final   HDL 78/46/9629 50  >40 mg/dL Final   Total CHOL/HDL Ratio 12/09/2022 3.7  RATIO Final   VLDL 12/09/2022 18  0 - 40 mg/dL Final   LDL Cholesterol 12/09/2022 115 (H)  0 - 99 mg/dL Final   Comment:        Total Cholesterol/HDL:CHD Risk Coronary Heart Disease  Risk Table                     Men   Women  1/2 Average Risk   3.4   3.3  Average Risk       5.0   4.4  2 X Average Risk   9.6   7.1  3 X Average Risk  23.4   11.0        Use the calculated Patient Ratio above and the CHD Risk Table to determine the patient's CHD Risk.        ATP III CLASSIFICATION (LDL):  <100     mg/dL   Optimal  528-413  mg/dL   Near or Above                    Optimal  130-159  mg/dL   Borderline  244-010  mg/dL   High  >272     mg/dL   Very High Performed at [facility], [address] [place], [place] [address]    Lipoprotein (a) 12/09/2022 47.5 (H)  <75.0 nmol/L Final   Comment: (NOTE) Note:  Values greater than or equal to 75.0 nmol/L may       indicate an independent risk factor for CHD,       but must be evaluated with caution when applied       to non-Caucasian populations due to the       influence of genetic factors on Lp(a) across       ethnicities. Performed At: [facility] [address] [place], [place] [id number] Jolene Schimke MD DG:[id number]    Free T4 12/09/2022 0.78  0.61 - 1.12 ng/dL Final   Comment: (NOTE) Biotin ingestion may interfere with free T4 tests. If the results are inconsistent with the TSH level, previous test results, or the clinical presentation, then consider biotin interference. If needed, order repeat testing after stopping biotin. Performed at [facility] Lab, [address], [place], [place] [address]    D-Dimer, Quant 12/09/2022 0.43  0.00 - 0.50 ug/mL-FEU Final   Comment: (NOTE) At the manufacturer cut-off value of 0.5 g/mL FEU, this assay has a negative predictive value of 95-100%.This assay is  intended for use in conjunction with a clinical pretest probability (PTP) assessment model to exclude pulmonary embolism (PE) and deep venous thrombosis (DVT) in outpatients suspected of PE or DVT. Results should be correlated with clinical presentation. Performed at [facility],  [address] [place], [place] [address]    T3, Total 12/09/2022 103  71 - 180 ng/dL Final   Comment: (NOTE) Performed At: [facility] [address] [place], [place] [id number] Jolene Schimke MD UX:[id number]    Glucose-Capillary 12/08/2022 108 (H)  70 - 99 mg/dL Final   Glucose reference range applies only to samples taken after fasting for at least 8 hours.   Glucose-Capillary 12/09/2022 122 (H)  70 - 99 mg/dL Final   Glucose reference range applies only to samples taken after fasting for at least 8 hours.   Glucose-Capillary 12/09/2022 115 (H)  70 - 99 mg/dL Final   Glucose reference range applies only to samples taken after fasting for at least 8 hours.   Glucose-Capillary 12/09/2022 148 (H)  70 - 99 mg/dL Final   Glucose reference range applies only to samples taken after fasting for at least 8 hours.   Glucose-Capillary 12/09/2022 109 (H)  70 - 99 mg/dL Final   Glucose reference range applies only to samples taken after fasting for at least 8 hours.  Admission on 12/07/2022, Discharged on 12/08/2022  Component Date Value Ref Range Status   Sodium 12/07/2022 141  135 - 145 mmol/L Final   Potassium 12/07/2022 3.4 (L)  3.5 - 5.1 mmol/L Final   Chloride 12/07/2022 103  98 - 111 mmol/L Final   CO2 12/07/2022 21 (L)  22 - 32 mmol/L Final   Glucose, Bld 12/07/2022 242 (H)  70 - 99 mg/dL Final   Glucose reference range applies only to samples taken after fasting for at least 8 hours.   BUN 12/07/2022 8  6 - 20 mg/dL Final   Creatinine, Ser 12/07/2022 1.80 (H)  0.61 - 1.24 mg/dL Final   DELTA CHECK NOTED   Calcium 12/07/2022 9.1  8.9 - 10.3 mg/dL Final   Total Protein 53/66/4403 7.7  6.5 - 8.1 g/dL Final   Albumin 47/42/5956 4.1  3.5 - 5.0 g/dL Final   AST 38/75/6433 60 (H)  15 - 41 U/L Final   ALT 12/07/2022 81 (H)  0 - 44 U/L Final   Alkaline Phosphatase 12/07/2022 81  38 - 126 U/L Final   Total Bilirubin 12/07/2022 0.6  0.3 - 1.2 mg/dL Final   GFR, Estimated 12/07/2022 43  (L)  >60 mL/min Final   Comment: (NOTE) Calculated using the CKD-EPI Creatinine Equation (2021)    Anion gap 12/07/2022 17 (H)  5 - 15 Final   Performed at [facility], [address], [place], [place] [address]   Alcohol, Ethyl (B) 12/07/2022 <10  <10 mg/dL Final   Comment: (NOTE) Lowest detectable limit for serum alcohol is 10 mg/dL.  For medical purposes only. Performed at [facility], [address] [place], [place] [address]    Salicylate Lvl 12/07/2022 <7.0 (L)  7.0 - 30.0 mg/dL Final   Performed at [facility], [address] [place], [place] [address]   Acetaminophen (Tylenol), Serum 12/07/2022 <10 (L)  10 - 30 ug/mL Final   Comment: (NOTE) Therapeutic concentrations vary significantly. A range of 10-30 ug/mL  may be an effective concentration for many patients. However, some  are best treated at concentrations outside of this range.  Acetaminophen concentrations >150 ug/mL at 4 hours after ingestion  and >50 ug/mL at 12 hours after ingestion are often associated with  toxic reactions.  Performed at [facility], [address] [place], [place] [address]    WBC 12/07/2022 9.7  4.0 - 10.5 K/uL Final   RBC 12/07/2022 4.75  4.22 - 5.81 MIL/uL Final   Hemoglobin 12/07/2022 15.3  13.0 - 17.0 g/dL Final   HCT 60/45/4098 43.0  39.0 - 52.0 % Final   MCV 12/07/2022 90.5  80.0 - 100.0 fL Final   MCH 12/07/2022 32.2  26.0 - 34.0 pg Final   MCHC 12/07/2022 35.6  30.0 - 36.0 g/dL Final   RDW 11/91/4782 14.6  11.5 - 15.5 % Final   Platelets 12/07/2022 213  150 - 400 K/uL Final   nRBC 12/07/2022 0.0  0.0 - 0.2 % Final   Performed at [facility], [address] [place], [place] [address]   Opiates 12/08/2022 NONE DETECTED  NONE DETECTED Final   Cocaine 12/08/2022 NONE DETECTED  NONE DETECTED Final   Benzodiazepines 12/08/2022 POSITIVE (A)  NONE DETECTED Final   Amphetamines 12/08/2022 NONE DETECTED  NONE DETECTED Final   Tetrahydrocannabinol  12/08/2022 NONE DETECTED  NONE DETECTED Final   Barbiturates 12/08/2022 NONE DETECTED  NONE DETECTED Final   Comment: (NOTE) DRUG SCREEN FOR MEDICAL PURPOSES ONLY.  IF CONFIRMATION IS NEEDED FOR ANY PURPOSE, NOTIFY LAB WITHIN 5 DAYS.  LOWEST DETECTABLE LIMITS FOR URINE DRUG SCREEN Drug Class                     Cutoff (ng/mL) Amphetamine and metabolites    1000 Barbiturate and metabolites    200 Benzodiazepine                 200 Opiates and metabolites        300 Cocaine and metabolites        300 THC                            50 Performed at [facility], [address] [place], [place] [address]    Beta-Hydroxybutyric Acid 12/07/2022 0.10  0.05 - 0.27 mmol/L Final   Performed at [facility], [address] [place], [place] [address]   Lactic Acid, Venous 12/07/2022 2.4 (HH)  0.5 - 1.9 mmol/L Final   Comment: CRITICAL RESULT CALLED TO, READ BACK BY AND VERIFIED WITH Birdie Hopes RN @ [phone] 12/07/22 JBUTLER Performed at [facility], [address] [place], [place] [address]    Lactic Acid, Venous 12/08/2022 2.0 (HH)  0.5 - 1.9 mmol/L Final   Comment: CRITICAL VALUE NOTED. VALUE IS CONSISTENT WITH PREVIOUSLY REPORTED/CALLED VALUE Performed at [facility], [address], [place], [place] [address]    Magnesium 12/07/2022 2.5 (H)  1.7 - 2.4 mg/dL Final   Comment: HEMOLYSIS AT THIS LEVEL MAY AFFECT RESULT Performed at [facility], [address] [place], [place] [address]    Sodium 12/08/2022 142  135 - 145 mmol/L Final   Potassium 12/08/2022 3.2 (L)  3.5 - 5.1 mmol/L Final   Chloride 12/08/2022 110  98 - 111 mmol/L Final   CO2 12/08/2022 19 (L)  22 - 32 mmol/L Final   Glucose, Bld 12/08/2022 158 (H)  70 - 99 mg/dL Final   Glucose reference range applies only to samples taken after  fasting for at least 8 hours.   BUN 12/08/2022 8  6 - 20 mg/dL Final   Creatinine, Ser 12/08/2022 1.70 (H)  0.61 - 1.24 mg/dL Final   Calcium 16/02/9603 7.9 (L)   8.9 - 10.3 mg/dL Final   GFR, Estimated 12/08/2022 46 (L)  >60 mL/min Final   Comment: (NOTE) Calculated using the CKD-EPI Creatinine Equation (2021)    Anion gap 12/08/2022 13  5 - 15 Final   Performed at [facility], [address], [place], [place] [address]   Osmolality 12/08/2022 345 (HH)  275 - 295 mOsm/kg Final   Comment: REPEATED TO VERIFY CRITICAL RESULT CALLED TO, READ BACK BY AND VERIFIED WITH: GASQUE,S RN 12/08/2022 AT 0238 SKEEN,P Performed at [facility], [address], [place], [place] [address]   No results displayed because visit has over 200 results.    Admission on 09/29/2022, Discharged on 10/01/2022  Component Date Value Ref Range Status   Glucose-Capillary 09/30/2022 238 (H)  70 - 99 mg/dL Final   Glucose reference range applies only to samples taken after fasting for at least 8 hours.   Glucose-Capillary 09/30/2022 159 (H)  70 - 99 mg/dL Final   Glucose reference range applies only to samples taken after fasting for at least 8 hours.   Glucose-Capillary 09/30/2022 191 (H)  70 - 99 mg/dL Final   Glucose reference range applies only to samples taken after fasting for at least 8 hours.   Glucose-Capillary 09/30/2022 275 (H)  70 - 99 mg/dL Final   Glucose reference range applies only to samples taken after fasting for at least 8 hours.   Glucose-Capillary 09/30/2022 247 (H)  70 - 99 mg/dL Final   Glucose reference range applies only to samples taken after fasting for at least 8 hours.   Glucose-Capillary 09/30/2022 236 (H)  70 - 99 mg/dL Final   Glucose reference range applies only to samples taken after fasting for at least 8 hours.   Glucose-Capillary 10/01/2022 156 (H)  70 - 99 mg/dL Final   Glucose reference range applies only to samples taken after fasting for at least 8 hours.   Glucose-Capillary 10/01/2022 186 (H)  70 - 99 mg/dL Final   Glucose reference range applies only to samples taken after fasting for at least 8 hours.    Glucose-Capillary 10/01/2022 139 (H)  70 - 99 mg/dL Final   Glucose reference range applies only to samples taken after fasting for at least 8 hours.  Admission on 09/29/2022, Discharged on 09/29/2022  Component Date Value Ref Range Status   WBC 09/29/2022 5.7  4.0 - 10.5 K/uL Final   RBC 09/29/2022 4.72  4.22 - 5.81 MIL/uL Final   Hemoglobin 09/29/2022 15.1  13.0 - 17.0 g/dL Final   HCT 78/29/5621 43.1  39.0 - 52.0 % Final   MCV 09/29/2022 91.3  80.0 - 100.0 fL Final   MCH 09/29/2022 32.0  26.0 - 34.0 pg Final   MCHC 09/29/2022 35.0  30.0 - 36.0 g/dL Final   RDW 30/86/5784 13.3  11.5 - 15.5 % Final   Platelets 09/29/2022 157  150 - 400 K/uL Final   nRBC 09/29/2022 0.0  0.0 - 0.2 % Final   Performed at [facility], [address] [place], [place] [address]   Neutrophils Relative % 09/29/2022 71  % Final   Neutro Abs 09/29/2022 4.1  1.7 - 7.7 K/uL Final   Lymphocytes Relative 09/29/2022 15  % Final  Lymphs Abs 09/29/2022 0.8  0.7 - 4.0 K/uL Final   Monocytes Relative 09/29/2022 12  % Final   Monocytes Absolute 09/29/2022 0.7  0.1 - 1.0 K/uL Final   Eosinophils Relative 09/29/2022 1  % Final   Eosinophils Absolute 09/29/2022 0.1  0.0 - 0.5 K/uL Final   Basophils Relative 09/29/2022 1  % Final   Basophils Absolute 09/29/2022 0.0  0.0 - 0.1 K/uL Final   Immature Granulocytes 09/29/2022 0  % Final   Abs Immature Granulocytes 09/29/2022 0.02  0.00 - 0.07 K/uL Final   Performed at [facility], [address] [place], [place] [address]   Sodium 09/29/2022 133 (L)  135 - 145 mmol/L Final   Potassium 09/29/2022 3.7  3.5 - 5.1 mmol/L Final   Chloride 09/29/2022 101  98 - 111 mmol/L Final   CO2 09/29/2022 23  22 - 32 mmol/L Final   Glucose, Bld 09/29/2022 206 (H)  70 - 99 mg/dL Final   Glucose reference range applies only to samples taken after fasting for at least 8 hours.   BUN 09/29/2022 11  6 - 20 mg/dL Final   Creatinine, Ser 09/29/2022 0.65  0.61 - 1.24 mg/dL Final    Calcium 60/45/4098 9.2  8.9 - 10.3 mg/dL Final   Total Protein 11/91/4782 7.0  6.5 - 8.1 g/dL Final   Albumin 95/62/1308 3.7  3.5 - 5.0 g/dL Final   AST 65/78/4696 126 (H)  15 - 41 U/L Final   ALT 09/29/2022 161 (H)  0 - 44 U/L Final   Alkaline Phosphatase 09/29/2022 115  38 - 126 U/L Final   Total Bilirubin 09/29/2022 0.7  0.3 - 1.2 mg/dL Final   GFR, Estimated 09/29/2022 >60  >60 mL/min Final   Comment: (NOTE) Calculated using the CKD-EPI Creatinine Equation (2021)    Anion gap 09/29/2022 9  5 - 15 Final   Performed at [facility], [address], [place], [place] [address]   Alcohol, Ethyl (B) 09/29/2022 <10  <10 mg/dL Final   Comment: (NOTE) Lowest detectable limit for serum alcohol is 10 mg/dL.  For medical purposes only. Performed at [facility], [address] [place], [place] [address]    Magnesium 09/29/2022 2.2  1.7 - 2.4 mg/dL Final   Performed at [facility], [address] [place], [place] [address]   Troponin I (High Sensitivity) 09/29/2022 6  <18 ng/L Final   Comment: (NOTE) Elevated high sensitivity troponin I (hsTnI) values and significant  changes across serial measurements may suggest ACS but many other  chronic and acute conditions are known to elevate hsTnI results.  Refer to the "Links" section for chest pain algorithms and additional  guidance. Performed at [facility], [address] [place], [place] [address]    Troponin I (High Sensitivity) 09/29/2022 5  <18 ng/L Final   Comment: (NOTE) Elevated high sensitivity troponin I (hsTnI) values and significant  changes across serial measurements may suggest ACS but many other  chronic and acute conditions are known to elevate hsTnI results.  Refer to the "Links" section for chest pain algorithms and additional  guidance. Performed at [facility], [address], [place], [place] [address]    Glucose-Capillary 09/29/2022 225 (H)  70 - 99 mg/dL Final   Glucose  reference range applies only to samples taken after fasting for at least 8 hours.  Admission on 09/27/2022, Discharged on 09/29/2022  Component Date Value Ref  Range Status   Glucose-Capillary 09/27/2022 271 (H)  70 - 99 mg/dL Final   Glucose reference range applies only to samples taken after fasting for at least 8 hours.   Glucose-Capillary 09/27/2022 252 (H)  70 - 99 mg/dL Final   Glucose reference range applies only to samples taken after fasting for at least 8 hours.   Glucose-Capillary 09/27/2022 284 (H)  70 - 99 mg/dL Final   Glucose reference range applies only to samples taken after fasting for at least 8 hours.   Glucose-Capillary 09/27/2022 226 (H)  70 - 99 mg/dL Final   Glucose reference range applies only to samples taken after fasting for at least 8 hours.   Glucose-Capillary 09/27/2022 342 (H)  70 - 99 mg/dL Final   Glucose reference range applies only to samples taken after fasting for at least 8 hours.   Glucose-Capillary 09/28/2022 255 (H)  70 - 99 mg/dL Final   Glucose reference range applies only to samples taken after fasting for at least 8 hours.   Glucose-Capillary 09/28/2022 324 (H)  70 - 99 mg/dL Final   Glucose reference range applies only to samples taken after fasting for at least 8 hours.   Glucose-Capillary 09/28/2022 309 (H)  70 - 99 mg/dL Final   Glucose reference range applies only to samples taken after fasting for at least 8 hours.   Glucose-Capillary 09/28/2022 311 (H)  70 - 99 mg/dL Final   Glucose reference range applies only to samples taken after fasting for at least 8 hours.   Glucose-Capillary 09/29/2022 305 (H)  70 - 99 mg/dL Final   Glucose reference range applies only to samples taken after fasting for at least 8 hours.  Admission on 09/26/2022, Discharged on 09/27/2022  Component Date Value Ref Range Status   WBC 09/26/2022 8.1  4.0 - 10.5 K/uL Final   RBC 09/26/2022 4.88  4.22 - 5.81 MIL/uL Final   Hemoglobin 09/26/2022 15.9  13.0 - 17.0 g/dL  Final   HCT 03/17/2535 44.4  39.0 - 52.0 % Final   MCV 09/26/2022 91.0  80.0 - 100.0 fL Final   MCH 09/26/2022 32.6  26.0 - 34.0 pg Final   MCHC 09/26/2022 35.8  30.0 - 36.0 g/dL Final   RDW 64/40/3474 13.5  11.5 - 15.5 % Final   Platelets 09/26/2022 150  150 - 400 K/uL Final   nRBC 09/26/2022 0.0  0.0 - 0.2 % Final   Performed at [facility], [address] [place], [place] [address]   Sodium 09/26/2022 131 (L)  135 - 145 mmol/L Final   Potassium 09/26/2022 4.2  3.5 - 5.1 mmol/L Final   Chloride 09/26/2022 98  98 - 111 mmol/L Final   CO2 09/26/2022 18 (L)  22 - 32 mmol/L Final   Glucose, Bld 09/26/2022 296 (H)  70 - 99 mg/dL Final   Glucose reference range applies only to samples taken after fasting for at least 8 hours.   BUN 09/26/2022 18  6 - 20 mg/dL Final   Creatinine, Ser 09/26/2022 0.88  0.61 - 1.24 mg/dL Final   Calcium 38/75/6433 9.9  8.9 - 10.3 mg/dL Final   Total Protein 29/51/8841 7.1  6.5 - 8.1 g/dL Final   Albumin 66/10/3014 3.8  3.5 - 5.0 g/dL Final   AST 05/29/3233 112 (H)  15 - 41 U/L Final   ALT 09/26/2022 172 (H)  0 - 44 U/L Final   Alkaline Phosphatase 09/26/2022 162 (H)  38 - 126  U/L Final   Total Bilirubin 09/26/2022 0.6  0.3 - 1.2 mg/dL Final   GFR, Estimated 09/26/2022 >60  >60 mL/min Final   Comment: (NOTE) Calculated using the CKD-EPI Creatinine Equation (2021)    Anion gap 09/26/2022 15  5 - 15 Final   Performed at [facility], [address] [place], [place] [address]   Glucose-Capillary 09/26/2022 281 (H)  70 - 99 mg/dL Final   Glucose reference range applies only to samples taken after fasting for at least 8 hours.   Alcohol, Ethyl (B) 09/26/2022 <10  <10 mg/dL Final   Comment: (NOTE) Lowest detectable limit for serum alcohol is 10 mg/dL.  For medical purposes only. Performed at [facility], [address] [place], [place] [address]    Color, Urine 09/26/2022 YELLOW  YELLOW Final   APPearance 09/26/2022 CLEAR  CLEAR Final    Specific Gravity, Urine 09/26/2022 1.023  1.005 - 1.030 Final   pH 09/26/2022 5.0  5.0 - 8.0 Final   Glucose, UA 09/26/2022 >=500 (A)  NEGATIVE mg/dL Final   Hgb urine dipstick 09/26/2022 NEGATIVE  NEGATIVE Final   Bilirubin Urine 09/26/2022 NEGATIVE  NEGATIVE Final   Ketones, ur 09/26/2022 NEGATIVE  NEGATIVE mg/dL Final   Protein, ur 91/47/8295 NEGATIVE  NEGATIVE mg/dL Final   Nitrite 62/13/0865 NEGATIVE  NEGATIVE Final   Leukocytes,Ua 09/26/2022 NEGATIVE  NEGATIVE Final   RBC / HPF 09/26/2022 0-5  0 - 5 RBC/hpf Final   WBC, UA 09/26/2022 0-5  0 - 5 WBC/hpf Final   Bacteria, UA 09/26/2022 RARE (A)  NONE SEEN Final   Squamous Epithelial / HPF 09/26/2022 0-5  0 - 5 /HPF Final   Performed at [facility], [address], [place], [place] [address]   Opiates 09/26/2022 NONE DETECTED  NONE DETECTED Final   Cocaine 09/26/2022 NONE DETECTED  NONE DETECTED Final   Benzodiazepines 09/26/2022 POSITIVE (A)  NONE DETECTED Final   Amphetamines 09/26/2022 NONE DETECTED  NONE DETECTED Final   Tetrahydrocannabinol 09/26/2022 NONE DETECTED  NONE DETECTED Final   Barbiturates 09/26/2022 NONE DETECTED  NONE DETECTED Final   Comment: (NOTE) DRUG SCREEN FOR MEDICAL PURPOSES ONLY.  IF CONFIRMATION IS NEEDED FOR ANY PURPOSE, NOTIFY LAB WITHIN 5 DAYS.  LOWEST DETECTABLE LIMITS FOR URINE DRUG SCREEN Drug Class                     Cutoff (ng/mL) Amphetamine and metabolites    1000 Barbiturate and metabolites    200 Benzodiazepine                 200 Opiates and metabolites        300 Cocaine and metabolites        300 THC                            50 Performed at [facility], [address] [place], [place] [address]    pH, Ven 09/26/2022 7.437 (H)  7.25 - 7.43 Final   pCO2, Ven 09/26/2022 31.6 (L)  44 - 60 mmHg Final   pO2, Ven 09/26/2022 87 (H)  32 - 45 mmHg Final   Bicarbonate 09/26/2022 21.3  20.0 - 28.0 mmol/L Final   TCO2 09/26/2022 22  22 - 32 mmol/L Final   O2 Saturation  09/26/2022 97  % Final   Acid-base deficit 09/26/2022 2.0  0.0 - 2.0 mmol/L Final  Sodium 09/26/2022 133 (L)  135 - 145 mmol/L Final   Potassium 09/26/2022 3.6  3.5 - 5.1 mmol/L Final   Calcium, Ion 09/26/2022 1.19  1.15 - 1.40 mmol/L Final   HCT 09/26/2022 42.0  39.0 - 52.0 % Final   Hemoglobin 09/26/2022 14.3  13.0 - 17.0 g/dL Final   Sample type 11/91/4782 VENOUS   Final   Glucose-Capillary 09/26/2022 294 (H)  70 - 99 mg/dL Final   Glucose reference range applies only to samples taken after fasting for at least 8 hours.  There may be more visits with results that are not included.    Blood Alcohol level:  Lab Results  Component Value Date   ETH <10 12/10/2022   ETH <10 12/10/2022    Metabolic Disorder Labs: Lab Results  Component Value Date   HGBA1C 10.8 (H) 09/25/2022   MPG 263.26 09/25/2022   MPG 151.33 05/08/2020   No results found for: "PROLACTIN" Lab Results  Component Value Date   CHOL 183 12/09/2022   TRIG 91 12/09/2022   HDL 50 12/09/2022   CHOLHDL 3.7 12/09/2022   VLDL 18 12/09/2022   LDLCALC 115 (H) 12/09/2022   LDLCALC 60 09/25/2022    Therapeutic Lab Levels: No results found for: "LITHIUM" No results found for: "VALPROATE" No results found for: "CBMZ"  Physical Findings   PHQ2-9    Flowsheet Row ED from 12/11/2022 in [facility] ED from 09/29/2022 in [facility] ED from 09/27/2022 in [facility] ED from 09/25/2022 in [facility] Office Visit from 01/15/2022 in [facility] at [facility]  PHQ-2 Total Score 1 0 0 2 0  PHQ-9 Total Score -- 4 -- -- --      Flowsheet Row ED from 12/11/2022 in [facility] Most recent reading at 12/11/2022  4:57 AM ED from 12/10/2022 in [facility] Emergency Department at [facility] Most recent reading at 12/10/2022  6:38 PM ED from 12/10/2022 in [facility]  Emergency Department at [facility] Most recent reading at 12/10/2022  3:52 PM  C-SSRS RISK CATEGORY No Risk No Risk No Risk        Musculoskeletal  Strength & Muscle Tone: within normal limits Gait & Station: normal Patient leans: Front  Psychiatric Specialty Exam  Presentation  General Appearance:  Appropriate for Environment; Casual; Fairly Groomed  Eye Contact: Good  Speech: Normal Rate (Noteable accent with limited English vocabulary)  Speech Volume: Normal  Handedness: Right   Mood and Affect  Mood: -- ("Good")  Affect: Appropriate; Congruent; Full Range   Thought Process  Thought Processes: Coherent; Goal Directed; Linear  Descriptions of Associations:Intact  Orientation:Full (Time, Place and Person)  Thought Content:Logical; WDL  Diagnosis of Schizophrenia or Schizoaffective disorder in past: No    Hallucinations:Hallucinations: None  Ideas of Reference:None  Suicidal Thoughts:Suicidal Thoughts: No  Homicidal Thoughts:Homicidal Thoughts: No   Sensorium  Memory: Immediate Fair; Recent Fair; Remote Fair  Judgment: Fair  Insight: Fair   Executive Functions  Concentration: Good  Attention Span: Good  Recall: Good  Fund of Knowledge: Fair  Language: Other (comment) (Primary language is Spanish)   Psychomotor Activity  Psychomotor Activity: Psychomotor Activity: Normal   Assets  Assets: Communication Skills; Desire for Improvement; Housing; Social Support   Sleep  Sleep: Sleep: Good Number of Hours of Sleep: 8   Nutritional Assessment (For OBS and FBC admissions only) Has the patient  had a weight loss or gain of 10 pounds or more in the last 3 months?: No Has the patient had a decrease in food intake/or appetite?: No Does the patient have dental problems?: No Does the patient have eating habits or behaviors that may be indicators of an eating disorder including binging or inducing vomiting?: No Has the  patient recently lost weight without trying?: 0 Has the patient been eating poorly because of a decreased appetite?: 0 Malnutrition Screening Tool Score: 0    Physical Exam  Physical Exam Vitals and nursing note reviewed.  Constitutional:      General: He is not in acute distress.    Appearance: He is not diaphoretic.  HENT:     Head: Normocephalic and atraumatic.  Eyes:     Conjunctiva/sclera: Conjunctivae normal.  Pulmonary:     Effort: Pulmonary effort is normal.  Musculoskeletal:     Cervical back: Normal range of motion.  Neurological:     Mental Status: He is alert and oriented to person, place, and time.    Review of Systems  Constitutional:  Negative for diaphoresis.  Eyes: Negative.   Respiratory:  Negative for shortness of breath.   Cardiovascular:  Negative for chest pain.  Gastrointestinal: Negative.   Genitourinary: Negative.   Skin:  Negative for itching and rash.  Neurological:  Negative for dizziness, tingling, tremors and headaches.   Blood pressure 100/60, pulse 75, temperature 97.9 F (36.6 C), temperature source Oral, resp. rate 18, SpO2 97%. There is no height or weight on file to calculate BMI.  Treatment Plan Summary: Daily contact with patient to assess and evaluate symptoms and progress in treatment  Alcohol use disorder: Patient states desire to to go to an inpatient treatment facility after finishing treatment at [facility]. Two most recent CIWA scores both 0 Will continue Ativan taper and assess for withdrawal symptoms. Will work with social work to find inpatient placement, patient preference in [place].  Chronic medical conditions Hypertension and Hyperlipidemia - will continue to hold statin and aspirin at this time. Diabetes Mellitus Type 2 - Will switch from insulin aspartate to metformin 1500 daily (home dose)   Ezekiel Slocumb, MS3 [facility]  12/11/2022 11:27 AM

## 2022-12-11 NOTE — Tx Team (Signed)
LCSW and MD met with patient at bedside to assess current mood, affect, physical state, and inquire about needs/goals while here in [facility] and after discharge. Spanish Interpreter was not needed. Patient present with similar case from previous admission. Patient reports needing to quit drinking alcohol in order to keep his family together. Patient minimized the amount that he drinks and reports '"I only drink 6 beers per day", however patient reports his wife is tired of him drinking daily. Per chart, Pt reports he was discharged from the ED after being treated for intoxication, chest pain, and a fall. Patient has attempt to detox before with little success. Patient reports the last time he was sober was after his discharge from the [facility] in May. Patient reports being sober for about 3 weeks before relapsing. 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 Patient reports he become frustrated with himself and out of boredom he drinks to cope with the fact he is not working. Patient reports needing to find another outlet to help him. Patient reports at this time he would be interested in residential placement. Patient has provided permission for LCSW to send his clinicals out for review. Patient provided permission for team to follow up with his son as needed.  Derek Cruz [phone]. No other needs were reported at this time.    Fernande Boyden, LCSW Clinical Social Worker [facility] Ph: [phone]

## 2022-12-11 NOTE — Progress Notes (Signed)
Pt is awake, alert and oriented X4. Pt did not voice any complaints of pain or discomfort. No signs of acute distress noted. Administered scheduled meds with no issue. Pt deneis current SI/HI/AVH, plan or intent. Staff will monitor for pt's safety.

## 2022-12-11 NOTE — Progress Notes (Signed)
Pt is asleep. Respirations are even and unlabored. No signs of acute distress noted. Staff will monitor for pt's safety. 

## 2022-12-11 NOTE — BH Assessment (Signed)
Comprehensive Clinical Assessment (CCA) Note  12/11/2022 Benjerman Molinelli [facility] [id number]  Disposition: [name], NP, patient recommended for [facility] inpatient treatment.   The patient demonstrates the following risk factors for suicide: Chronic risk factors for suicide include: substance use disorder. Acute risk factors for suicide include: N/A. Protective factors for this patient include: hope for the future. Considering these factors, the overall suicide risk at this point appears to be low. Patient is appropriate for outpatient follow up.   Strummer Omarr Hann is a 57 yo male presenting as a voluntary walk-in to [facility] requesting alcohol detox. Patient was sent to ED for medical clearance and came back today after medical clearance for admission to detox program at [facility]. Patient denied SI, HI and psychosis. Patient reports drinking a 12 pack of beer daily, with last use on yesterday. Patient reports drinking all his life. Patient has history of failed detox attempts. Patient reports worsening depressive symptoms due to alcohol dependence and states he wants to do better. Patient reports 6-8 hours normal sleep and appetite.  Patient denied receiving any outpatient mental health services. Patient denied being prescribed any psych medications. Patient denied history of self-harming behaviors or suicide attempts.   Patient currently resides with partner (mother of children) and 3 children (47, 108 and 77). Patient denied family conflict. Patient is currently employed as a Designer, fashion/clothing. Patient denied access to guns. Patient was pleasant and cooperative. Patient agrees to detox treatment.   Chief Complaint:  Chief Complaint  Patient presents with   Addiction Problem   Visit Diagnosis: Alcohol Dependence    CCA Screening, Triage and Referral (STR)  Patient Reported Information How did you hear about [name]? Self  What Is the Reason for Your Visit/Call Today? Alcohol detox  How Long Has  This Been Causing You Problems? > than 6 months  What Do You Feel Would Help You the Most Today? Alcohol or Drug Use Treatment   Have You Recently Had Any Thoughts About Hurting Yourself? No  Are You Planning to Commit Suicide/Harm Yourself At This time? No   Flowsheet Row ED from 12/11/2022 in [facility] Most recent reading at 12/11/2022  4:57 AM ED from 12/10/2022 in [facility] Emergency Department at [facility] Most recent reading at 12/10/2022  6:38 PM ED from 12/10/2022 in [facility] Emergency Department at [facility] Most recent reading at 12/10/2022  3:52 PM  C-SSRS RISK CATEGORY No Risk No Risk No Risk       Have you Recently Had Thoughts About Hurting Someone Karolee Ohs? No  Are You Planning to Harm Someone at This Time? No  Explanation: n/a   Have You Used Any Alcohol or Drugs in the Past 24 Hours? No  What Did You Use and How Much? n/a   Do You Currently Have a Therapist/Psychiatrist? No  Name of Therapist/Psychiatrist: Name of Therapist/Psychiatrist: n/a   Have You Been Recently Discharged From Any Office Practice or Programs? No  Explanation of Discharge From Practice/Program: n/a     CCA Screening Triage Referral Assessment Type of Contact: Face-to-Face  Telemedicine Service Delivery: Telemedicine service delivery: This service was provided via telemedicine using a 2-way, interactive audio and video technology  Is this Initial or Reassessment? Is this Initial or Reassessment?: Initial Assessment  Date Telepsych consult ordered in [facility]:  Date Telepsych consult ordered in [facility]:  (n/a)  Time Telepsych consult ordered in [facility]:  Time Telepsych consult ordered in [facility]: 0000 (n/a)  Location of Assessment: [facility] Assessment Services  Provider Location: [facility] Assessment Services   Collateral Involvement: none reported   Does Patient Have a Automotive engineer Guardian? No  Legal Guardian Contact Information:  n/a  Copy of Legal Guardianship Form: -- (n/a)  Legal Guardian Notified of Arrival: -- (n/a)  Legal Guardian Notified of Pending Discharge: -- (n/a)  If Minor and Not Living with Parent(s), Who has Custody? n/a  Is CPS involved or ever been involved? Never  Is APS involved or ever been involved? Never   Patient Determined To Be At Risk for Harm To Self or Others Based on Review of Patient Reported Information or Presenting Complaint? No  Method: No Plan  Availability of Means: No access or NA  Intent: Vague intent or NA  Notification Required: No need or identified person  Additional Information for Danger to Others Potential: -- (n/a)  Additional Comments for Danger to Others Potential: n/a  Are There Guns or Other Weapons in Your Home? No  Types of Guns/Weapons: n/a  Are These Weapons Safely Secured?                            -- (n/a)  Who Could Verify You Are Able To Have These Secured: n/a  Do You Have any Outstanding Charges, Pending Court Dates, Parole/Probation? none reported  Contacted To Inform of Risk of Harm To Self or Others: Family/Significant Other:    Does Patient Present under Involuntary Commitment? No    [place] of Residence: [place]   Patient Currently Receiving the Following Services: Not Receiving Services   Determination of Need: Emergent (2 hours)   Options For Referral: Medication Management; Outpatient Therapy; Facility-Based Crisis     CCA Biopsychosocial Patient Reported Schizophrenia/Schizoaffective Diagnosis in Past: No   Strengths: self-awareness   Mental Health Symptoms Depression:   Hopelessness; Fatigue   Duration of Depressive symptoms:  Duration of Depressive Symptoms: Greater than two weeks   Mania:   None   Anxiety:    Worrying; Fatigue   Psychosis:   None   Duration of Psychotic symptoms:    Trauma:   None   Obsessions:   None   Compulsions:   None   Inattention:   None    Hyperactivity/Impulsivity:   None   Oppositional/Defiant Behaviors:   None   Emotional Irregularity:   None   Other Mood/Personality Symptoms:   none    Mental Status Exam Appearance and self-care  Stature:   Average   Weight:   Average weight   Clothing:   Disheveled   Grooming:   Normal   Cosmetic use:   None   Posture/gait:   Normal   Motor activity:   Not Remarkable   Sensorium  Attention:   Normal   Concentration:   Normal   Orientation:   X5   Recall/memory:   Normal   Affect and Mood  Affect:   Appropriate   Mood:   -- (appropriate)   Relating  Eye contact:   Normal   Facial expression:   Sad   Attitude toward examiner:   Cooperative; Defensive   Thought and Language  Speech flow:  Normal   Thought content:   Appropriate to Mood and Circumstances   Preoccupation:   None   Hallucinations:   None   Organization:   Coherent   [employer] of Knowledge:   Average   Intelligence:   Average   Abstraction:  Normal   Judgement:   Normal   Reality Testing:   Adequate   Insight:   Fair   Decision Making:   Impulsive   Social Functioning  Social Maturity:   Impulsive   Social Judgement:   Heedless   Stress  Stressors:   Relationship   Coping Ability:   Overwhelmed; Exhausted   Skill Deficits:   Self-control; Responsibility; Self-care; Decision making   Supports:   Family     Religion: Religion/Spirituality Are You A Religious Person?: Yes How Might This Affect Treatment?: unknown  Leisure/Recreation: Leisure / Recreation Do You Have Hobbies?: No Leisure and Hobbies: na  Exercise/Diet: Exercise/Diet Do You Exercise?: No Have You Gained or Lost A Significant Amount of Weight in the Past Six Months?: No Do You Follow a Special Diet?: No Do You Have Any Trouble Sleeping?: Yes Explanation of Sleeping Difficulties: 6-8 hours   CCA Employment/Education Employment/Work  Situation: Employment / Work Situation Employment Situation: Employed Work Stressors: none Patient's Job has Been Impacted by Current Illness: No Has Patient ever Been in Equities trader?: No  Education: Education Is Patient Currently Attending School?: No Last Grade Completed: 8 ("grade school") Did Theme park manager?: No Did You Have An Individualized Education Program (IIEP): No Did You Have Any Difficulty At [employer]?: No Patient's Education Has Been Impacted by Current Illness: No   CCA Family/Childhood History Family and Relationship History: Family history Marital status: Long term relationship Long term relationship, how long?: 25 years What types of issues is patient dealing with in the relationship?: patients drinking Additional relationship information: none Does patient have children?: Yes How many children?: 3 How is patient's relationship with their children?: good  Childhood History:  Childhood History By whom was/is the patient raised?: Mother Did patient suffer any verbal/emotional/physical/sexual abuse as a child?: No Did patient suffer from severe childhood neglect?: No Has patient ever been sexually abused/assaulted/raped as an adolescent or adult?: No Was the patient ever a victim of a crime or a disaster?: No Witnessed domestic violence?: No Has patient been affected by domestic violence as an adult?: No       CCA Substance Use Alcohol/Drug Use: Alcohol / Drug Use Pain Medications: see MAR Prescriptions: see MAR Over the Counter: see MAR History of alcohol / drug use?: Yes Longest period of sobriety (when/how long): none reported Negative Consequences of Use: Personal relationships Withdrawal Symptoms: Blackouts, Irritability, Nausea / Vomiting, Patient aware of relationship between substance abuse and physical/medical complications, Tremors, Weakness Substance #1 Name of Substance 1: alcohol 1 - Age of First Use: "all my life" 1 - Amount  (size/oz): 12 pack 1 - Frequency: daily 1 - Duration: ongoing 1 - Last Use / Amount: yesterday 1 - Method of Aquiring: store purchase                       ASAM's:  Six Dimensions of Multidimensional Assessment  Dimension 1:  Acute Intoxication and/or Withdrawal Potential:   Dimension 1:  Description of individual's past and current experiences of substance use and withdrawal: Continued usage  Dimension 2:  Biomedical Conditions and Complications:   Dimension 2:  Description of patient's biomedical conditions and  complications: None reported  Dimension 3:  Emotional, Behavioral, or Cognitive Conditions and Complications:  Dimension 3:  Description of emotional, behavioral, or cognitive conditions and complications: Sadness, marital problems.  Dimension 4:  Readiness to Change:  Dimension 4:  Description of Readiness to Change criteria: Patient motivated for treatment.  Dimension 5:  Relapse, Continued use, or Continued Problem Potential:  Dimension 5:  Relapse, continued use, or continued problem potential critiera description: Continued usage.  Dimension 6:  Recovery/Living Environment:  Dimension 6:  Recovery/Iiving environment criteria description: Family is supportive of patients treatment.  ASAM Severity Score: ASAM's Severity Rating Score: 6  ASAM Recommended Level of Treatment: ASAM Recommended Level of Treatment: Level II Partial Hospitalization Treatment   Substance use Disorder (SUD) Substance Use Disorder (SUD)  Checklist Symptoms of Substance Use: Social, occupational, recreational activities given up or reduced due to use, Persistent desire or unsuccessful efforts to cut down or control use, Presence of craving or strong urge to use, Evidence of tolerance, Continued use despite persistent or recurrent social, interpersonal problems, caused or exacerbated by use  Recommendations for Services/Supports/Treatments: Recommendations for  Services/Supports/Treatments Recommendations For Services/Supports/Treatments: Inpatient Hospitalization, Individual Therapy, Medication Management  Discharge Disposition:    DSM5 Diagnoses: Patient Active Problem List   Diagnosis Date Noted   Alcohol withdrawal (HCC) 12/08/2022   Abnormal TSH 12/08/2022   Hypokalemia 12/08/2022   CKD (chronic kidney disease), stage III (HCC) 12/08/2022   Alcohol use disorder 12/01/2022   Frequent falls 12/01/2022   Elevated liver enzymes    Alcohol abuse    Liver failure (HCC) 05/07/2020   Alcoholic hepatitis    Hyponatremia    Polycythemia    Hemoglobin A1c less than 7.0% 07/16/2019   Neck pain 03/31/2019   Muscle strain 03/31/2019   Undifferentiated abdominal pain 03/31/2019   Gastroesophageal reflux disease without esophagitis 11/24/2018   Chest discomfort 11/24/2018   Essential hypertension 11/24/2018   Pre-diabetes 11/24/2018   Hyperlipidemia 10/06/2016   Chest pain 09/26/2016   Uncontrolled type 2 diabetes mellitus with hyperglycemia, without long-term current use of insulin (HCC) 09/26/2016     Referrals to Alternative Service(s): Referred to Alternative Service(s):   Place:   Date:   Time:    Referred to Alternative Service(s):   Place:   Date:   Time:    Referred to Alternative Service(s):   Place:   Date:   Time:    Referred to Alternative Service(s):   Place:   Date:   Time:     Burnetta Sabin, [facility]

## 2022-12-11 NOTE — ED Notes (Signed)
Patient observed/assessed in room in bed appearing in no immediate distress resting peacefully. Q15 minute checks continued by MHT and nursing staff. Will continue to monitor and support. 

## 2022-12-11 NOTE — ED Provider Notes (Cosign Needed Addendum)
Behavioral Health Progress Note  Date and Time: 12/11/2022 11:55 AM Name: Derek Cruz MRN:  [id number]  Subjective:  [name] is a 57 year old male with previous medical history of alcohol use disorder with history of seizures, DTs, intubation, and ICU admission during withdrawal; HTN, HLD, and DM type 2. Came to [facility] with the goal of alcohol detox and admission to a residential treatment facility.   He was initially seen in his room sleeping, dressed causally, was calm and pleasant during the interview. Patient stated that he felt comfortable with the interview being conducted in [place]. He stated that he felt "good" this morning, and that he felt good last night. He wants to stop drinking because his wife and children want him to stop. He drinks a six pack of Coors light every day. This makes him relaxed and he noted that he has fallen a few times while walking after drinking. His most recent alcoholic drink was 2 days ago, a six pack of coors light. He started drinking at 2. He was most recently sober 3 months ago which lasted 3 weeks. 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 He denies any other substance use including nicotine products, cannabis, heroin, cocaine, and benzodiazepines.  He denies any current SI/HI/AVH and tactile hallucinations.  He also states that he takes metformin at home for his diabetes and would like to restart it instead of taking insulin.  While patient responded appropriately to questions the team has concerns that he may have difficulty understanding inpatient programming that is not provided in Spanish. Use of an interpreter is highly recommended for future interviews. The patient consented to the team contacting his son for collateral.   On collateral interview with son Derek Cruz [phone], patient said that his dad  has always had a problem with alcohol, but that it intensified greatly after his wife left him some months ago.  Son said that family had taken measures to stop dad from physically procuring alcohol "we take his keys, but he would still find a way to get beer."  Son said that it has never been like this before-notes that patient had 3 months of sobriety at one point, but he started drinking again for some reason.  Patient notes to distinct episodes of suicidal ideation where he threatened to hurt himself with a knife, but was physically restrained by family.  Denies access to firearms.  Denies history of serious withdrawal symptoms.  Son said that patient's ex-wife had wanted a 24-month residential program whereas patient wanted a 1 month residential program.  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 Lives in [place] with his wife and 3 kids; son - 69 years old, daughter - 79 years old, daughter 38 years old. He denies having any legal issues related to his drinking.   Additional Social History:    Pain Medications: see MAR Prescriptions: see MAR Over the Counter: see MAR History of alcohol / drug use?: Yes Longest period  of sobriety (when/how long): none reported Negative Consequences of Use: Personal relationships Withdrawal Symptoms: Blackouts, Irritability, Nausea / Vomiting, Patient aware of relationship between substance abuse and physical/medical complications, Tremors, Weakness Name of Substance 1: alcohol 1 - Age of First Use: "all my life" 1 - Amount (size/oz): 12 pack 1 - Frequency: daily 1 - Duration: ongoing 1 - Last Use /  Amount: yesterday 1 - Method of Aquiring: store purchase                  Sleep: Good  Appetite:  Fair  Current Medications:  Current Facility-Administered Medications  Medication Dose Route Frequency Provider Last Rate Last Admin   acetaminophen (TYLENOL) tablet 650 mg  650 mg Oral Q6H PRN [name], NP       alum & mag hydroxide-simeth (MAALOX/MYLANTA) 200-200-20 MG/5ML suspension 30 mL  30 mL Oral Q4H PRN [name], NP       hydrOXYzine (ATARAX) tablet 25 mg  25 mg Oral Q6H PRN [name], NP       loperamide (IMODIUM) capsule 2-4 mg  2-4 mg Oral PRN [name], NP       LORazepam (ATIVAN) tablet 1 mg  1 mg Oral Q6H PRN [name], NP       OLANZapine zydis (ZYPREXA) disintegrating tablet 10 mg  10 mg Oral Q8H PRN [name], NP       And   LORazepam (ATIVAN) tablet 1 mg  1 mg Oral PRN [name], NP       And   ziprasidone (GEODON) injection 20 mg  20 mg Intramuscular PRN [name], NP       LORazepam (ATIVAN) tablet 1 mg  1 mg Oral QID [name], NP   1 mg at 12/11/22 1610   Followed by   Melene Muller ON 12/12/2022] LORazepam (ATIVAN) tablet 1 mg  1 mg Oral TID [name], NP       Followed by   Melene Muller ON 12/13/2022] LORazepam (ATIVAN) tablet 1 mg  1 mg Oral BID [name], NP       Followed by   Melene Muller ON 12/15/2022] LORazepam (ATIVAN) tablet 1 mg  1 mg Oral Daily [name], NP       magnesium hydroxide (MILK OF MAGNESIA) suspension 30 mL  30 mL Oral Daily PRN [name], NP       Melene Muller ON 12/12/2022] metFORMIN (GLUCOPHAGE-XR) 24 hr tablet 1,500 mg  1,500 mg Oral Q breakfast Tomie China, MD       multivitamin with minerals tablet 1 tablet  1 tablet Oral Daily [name], NP   1 tablet at 12/11/22 0912   ondansetron (ZOFRAN-ODT) disintegrating tablet 4 mg  4 mg Oral Q6H PRN [name], NP       Melene Muller ON 12/12/2022] thiamine (VITAMIN B1) tablet 100 mg  100 mg Oral Daily [name], NP       Current Outpatient Medications   Medication Sig Dispense Refill   chlordiazePOXIDE (LIBRIUM) 10 MG capsule Take 1 capsule (10 mg total) by mouth 4 (four) times daily for 3 days, THEN 1 capsule (10 mg total) 3 (three) times daily for 3 days, THEN 1 capsule (10 mg total) 2 (two) times daily for 3 days, THEN 1 capsule (10 mg total) daily for 3 days. (Patient not taking: Reported on 12/10/2022) 30 capsule 0   folic acid (FOLVITE) 1 MG tablet Take 1 tablet (1 mg total) by mouth daily. 30 tablet 0   metFORMIN (  GLUCOPHAGE-XR) 750 MG 24 hr tablet Take 2 tablets (1,500 mg total) by mouth daily with breakfast. (Patient not taking: Reported on 12/08/2022) 60 tablet 0   Multiple Vitamin (MULTIVITAMIN WITH MINERALS) TABS tablet Take 1 tablet by mouth daily. 130 tablet 0   thiamine (VITAMIN B1) 100 MG tablet Take 1 tablet (100 mg total) by mouth daily. 30 tablet 0    Labs  Lab Results:  Admission on 12/11/2022  Component Date Value Ref Range Status   Glucose-Capillary 12/11/2022 107 (H)  70 - 99 mg/dL Final   Glucose reference range applies only to samples taken after fasting for at least 8 hours.  Admission on 12/10/2022, Discharged on 12/11/2022  Component Date Value Ref Range Status   Sodium 12/10/2022 134 (L)  135 - 145 mmol/L Final   Potassium 12/10/2022 3.6  3.5 - 5.1 mmol/L Final   Chloride 12/10/2022 99  98 - 111 mmol/L Final   CO2 12/10/2022 25  22 - 32 mmol/L Final   Glucose, Bld 12/10/2022 177 (H)  70 - 99 mg/dL Final   Glucose reference range applies only to samples taken after fasting for at least 8 hours.   BUN 12/10/2022 13  6 - 20 mg/dL Final   Creatinine, Ser 12/10/2022 0.78  0.61 - 1.24 mg/dL Final   Calcium 25/36/6440 8.8 (L)  8.9 - 10.3 mg/dL Final   Total Protein 34/74/2595 6.7  6.5 - 8.1 g/dL Final   Albumin 63/87/5643 3.6  3.5 - 5.0 g/dL Final   AST 32/95/1884 33  15 - 41 U/L Final   ALT 12/10/2022 48 (H)  0 - 44 U/L Final   Alkaline Phosphatase 12/10/2022 81  38 - 126 U/L Final   Total Bilirubin 12/10/2022  0.7  0.3 - 1.2 mg/dL Final   GFR, Estimated 12/10/2022 >60  >60 mL/min Final   Comment: (NOTE) Calculated using the CKD-EPI Creatinine Equation (2021)    Anion gap 12/10/2022 10  5 - 15 Final   Performed at [facility] Lab, [address], [place], [place] [address]   Alcohol, Ethyl (B) 12/10/2022 <10  <10 mg/dL Final   Comment: (NOTE) Lowest detectable limit for serum alcohol is 10 mg/dL.  For medical purposes only. Performed at [facility] Lab, [address], [place], [place] [address]    Opiates 12/10/2022 NONE DETECTED  NONE DETECTED Final   Cocaine 12/10/2022 NONE DETECTED  NONE DETECTED Final   Benzodiazepines 12/10/2022 POSITIVE (A)  NONE DETECTED Final   Amphetamines 12/10/2022 NONE DETECTED  NONE DETECTED Final   Tetrahydrocannabinol 12/10/2022 NONE DETECTED  NONE DETECTED Final   Barbiturates 12/10/2022 POSITIVE (A)  NONE DETECTED Final   Comment: (NOTE) DRUG SCREEN FOR MEDICAL PURPOSES ONLY.  IF CONFIRMATION IS NEEDED FOR ANY PURPOSE, NOTIFY LAB WITHIN 5 DAYS.  LOWEST DETECTABLE LIMITS FOR URINE DRUG SCREEN Drug Class                     Cutoff (ng/mL) Amphetamine and metabolites    1000 Barbiturate and metabolites    200 Benzodiazepine                 200 Opiates and metabolites        300 Cocaine and metabolites        300 THC                            50 Performed at [facility]  Lab, [address] [place], [place] [address]    WBC 12/10/2022 5.1  4.0 - 10.5 K/uL Final   RBC 12/10/2022 4.11 (L)  4.22 - 5.81 MIL/uL Final   Hemoglobin 12/10/2022 13.0  13.0 - 17.0 g/dL Final   HCT 78/93/8101 37.5 (L)  39.0 - 52.0 % Final   MCV 12/10/2022 91.2  80.0 - 100.0 fL Final   MCH 12/10/2022 31.6  26.0 - 34.0 pg Final   MCHC 12/10/2022 34.7  30.0 - 36.0 g/dL Final   RDW 75/02/2584 13.5  11.5 - 15.5 % Final   Platelets 12/10/2022 249  150 - 400 K/uL Final   nRBC 12/10/2022 0.0  0.0 - 0.2 % Final   Neutrophils Relative % 12/10/2022 50  % Final   Neutro Abs  12/10/2022 2.6  1.7 - 7.7 K/uL Final   Lymphocytes Relative 12/10/2022 31  % Final   Lymphs Abs 12/10/2022 1.6  0.7 - 4.0 K/uL Final   Monocytes Relative 12/10/2022 13  % Final   Monocytes Absolute 12/10/2022 0.7  0.1 - 1.0 K/uL Final   Eosinophils Relative 12/10/2022 5  % Final   Eosinophils Absolute 12/10/2022 0.2  0.0 - 0.5 K/uL Final   Basophils Relative 12/10/2022 1  % Final   Basophils Absolute 12/10/2022 0.1  0.0 - 0.1 K/uL Final   Immature Granulocytes 12/10/2022 0  % Final   Abs Immature Granulocytes 12/10/2022 0.01  0.00 - 0.07 K/uL Final   Performed at [facility] Lab, [address], [place], [place] [address]  Admission on 12/10/2022, Discharged on 12/10/2022  Component Date Value Ref Range Status   Sodium 12/10/2022 134 (L)  135 - 145 mmol/L Final   Potassium 12/10/2022 4.0  3.5 - 5.1 mmol/L Final   Chloride 12/10/2022 103  98 - 111 mmol/L Final   CO2 12/10/2022 20 (L)  22 - 32 mmol/L Final   Glucose, Bld 12/10/2022 97  70 - 99 mg/dL Final   Glucose reference range applies only to samples taken after fasting for at least 8 hours.   BUN 12/10/2022 10  6 - 20 mg/dL Final   Creatinine, Ser 12/10/2022 0.67  0.61 - 1.24 mg/dL Final   Calcium 42/35/3614 9.0  8.9 - 10.3 mg/dL Final   Total Protein 43/15/4008 7.4  6.5 - 8.1 g/dL Final   Albumin 67/61/9509 4.0  3.5 - 5.0 g/dL Final   AST 32/67/1245 42 (H)  15 - 41 U/L Final   ALT 12/10/2022 56 (H)  0 - 44 U/L Final   Alkaline Phosphatase 12/10/2022 77  38 - 126 U/L Final   Total Bilirubin 12/10/2022 0.7  0.3 - 1.2 mg/dL Final   GFR, Estimated 12/10/2022 >60  >60 mL/min Final   Comment: (NOTE) Calculated using the CKD-EPI Creatinine Equation (2021)    Anion gap 12/10/2022 11  5 - 15 Final   Performed at [facility] Lab, [address] [place], [place] [address]   Alcohol, Ethyl (B) 12/10/2022 <10  <10 mg/dL Final   Comment: (NOTE) Lowest detectable limit for serum alcohol is 10 mg/dL.  For medical purposes  only. Performed at [facility] Lab, [address], [place], [place] [address]    WBC 12/10/2022 5.3  4.0 - 10.5 K/uL Final   RBC 12/10/2022 4.49  4.22 - 5.81 MIL/uL Final   Hemoglobin 12/10/2022 14.3  13.0 - 17.0 g/dL Final   HCT 05/39/7673 41.3  39.0 - 52.0 % Final   MCV 12/10/2022 92.0  80.0 - 100.0 fL Final   MCH 12/10/2022 31.8  26.0 - 34.0 pg Final   MCHC 12/10/2022 34.6  30.0 - 36.0 g/dL Final   RDW 09/81/1914 13.8  11.5 - 15.5 % Final   Platelets 12/10/2022 251  150 - 400 K/uL Final   nRBC 12/10/2022 0.0  0.0 - 0.2 % Final   Performed at [facility] Lab, [address] [place], [place] [address]   Opiates 12/10/2022 NONE DETECTED  NONE DETECTED Final   Cocaine 12/10/2022 NONE DETECTED  NONE DETECTED Final   Benzodiazepines 12/10/2022 POSITIVE (A)  NONE DETECTED Final   Amphetamines 12/10/2022 NONE DETECTED  NONE DETECTED Final   Tetrahydrocannabinol 12/10/2022 NONE DETECTED  NONE DETECTED Final   Barbiturates 12/10/2022 POSITIVE (A)  NONE DETECTED Final   Comment: (NOTE) DRUG SCREEN FOR MEDICAL PURPOSES ONLY.  IF CONFIRMATION IS NEEDED FOR ANY PURPOSE, NOTIFY LAB WITHIN 5 DAYS.  LOWEST DETECTABLE LIMITS FOR URINE DRUG SCREEN Drug Class                     Cutoff (ng/mL) Amphetamine and metabolites    1000 Barbiturate and metabolites    200 Benzodiazepine                 200 Opiates and metabolites        300 Cocaine and metabolites        300 THC                            50 Performed at [facility] Lab, [address] [place], [place] [address]   Admission on 12/08/2022, Discharged on 12/09/2022  Component Date Value Ref Range Status   WBC 12/08/2022 6.6  4.0 - 10.5 K/uL Final   RBC 12/08/2022 4.10 (L)  4.22 - 5.81 MIL/uL Final   Hemoglobin 12/08/2022 13.4  13.0 - 17.0 g/dL Final   HCT 86/57/8469 38.2 (L)  39.0 - 52.0 % Final   MCV 12/08/2022 93.2  80.0 - 100.0 fL Final   MCH 12/08/2022 32.7  26.0 - 34.0 pg Final   MCHC 12/08/2022 35.1  30.0 - 36.0  g/dL Final   RDW 62/95/2841 14.6  11.5 - 15.5 % Final   Platelets 12/08/2022 209  150 - 400 K/uL Final   nRBC 12/08/2022 0.0  0.0 - 0.2 % Final   Performed at [facility] Lab, [address], [place], [place] [address]   Troponin I (High Sensitivity) 12/08/2022 6  <18 ng/L Final   Comment: (NOTE) Elevated high sensitivity troponin I (hsTnI) values and significant  changes across serial measurements may suggest ACS but many other  chronic and acute conditions are known to elevate hsTnI results.  Refer to the "Links" section for chest pain algorithms and additional  guidance. Performed at [facility] Lab, [address], [place], [place] [address]    Sodium 12/08/2022 140  135 - 145 mmol/L Final   Potassium 12/08/2022 3.2 (L)  3.5 - 5.1 mmol/L Final   Chloride 12/08/2022 108  98 - 111 mmol/L Final   CO2 12/08/2022 19 (L)  22 - 32 mmol/L Final   Glucose, Bld 12/08/2022 147 (H)  70 - 99 mg/dL Final   Glucose reference range applies only to samples taken after fasting for at least 8 hours.   BUN 12/08/2022 <5 (L)  6 - 20 mg/dL Final   Creatinine, Ser 12/08/2022 1.22  0.61 -  1.24 mg/dL Final   Calcium 16/02/9603 8.6 (L)  8.9 - 10.3 mg/dL Final   Total Protein 54/01/8118 7.2  6.5 - 8.1 g/dL Final   Albumin 14/78/2956 3.8  3.5 - 5.0 g/dL Final   AST 21/30/8657 40  15 - 41 U/L Final   ALT 12/08/2022 61 (H)  0 - 44 U/L Final   Alkaline Phosphatase 12/08/2022 75  38 - 126 U/L Final   Total Bilirubin 12/08/2022 0.5  0.3 - 1.2 mg/dL Final   GFR, Estimated 12/08/2022 >60  >60 mL/min Final   Comment: (NOTE) Calculated using the CKD-EPI Creatinine Equation (2021)    Anion gap 12/08/2022 13  5 - 15 Final   Performed at [facility] Lab, [address], [place], [place] [address]   Lipase 12/08/2022 28  11 - 51 U/L Final   Performed at [facility] Lab, [address], [place], [place] [address]   Alcohol, Ethyl (B) 12/08/2022 48 (H)  <10 mg/dL Final   Comment: (NOTE) Lowest  detectable limit for serum alcohol is 10 mg/dL.  For medical purposes only. Performed at [facility], [address], [place], [place] [address]    Troponin I (High Sensitivity) 12/08/2022 6  <18 ng/L Final   Comment: (NOTE) Elevated high sensitivity troponin I (hsTnI) values and significant  changes across serial measurements may suggest ACS but many other  chronic and acute conditions are known to elevate hsTnI results.  Refer to the "Links" section for chest pain algorithms and additional  guidance. Performed at [facility] Lab, [address], [place], [place] [address]    Total CK 12/08/2022 238  49 - 397 U/L Final   Performed at [facility], [address] [place], [place] [address]   Magnesium 12/08/2022 2.1  1.7 - 2.4 mg/dL Final   Performed at [facility], [address], [place], [place] [address]   Phosphorus 12/08/2022 3.1  2.5 - 4.6 mg/dL Final   Performed at [facility], [address], [place], [place] [address]   Prothrombin Time 12/08/2022 15.0  11.4 - 15.2 seconds Final   INR 12/08/2022 1.2  0.8 - 1.2 Final   Comment: (NOTE) INR goal varies based on device and disease states. Performed at [facility], [address] [place], [place] [address]    Prealbumin 12/09/2022 26  18 - 38 mg/dL Final   Performed at [facility], [address], [place], [place] [address]   Total Protein 12/08/2022 6.0 (L)  6.5 - 8.1 g/dL Final   Albumin 32/95/1884 3.1 (L)  3.5 - 5.0 g/dL Final   AST 16/60/6301 29  15 - 41 U/L Final   ALT 12/08/2022 49 (H)  0 - 44 U/L Final   Alkaline Phosphatase 12/08/2022 58  38 - 126 U/L Final   Total Bilirubin 12/08/2022 0.5  0.3 - 1.2 mg/dL Final   Bilirubin, Direct 12/08/2022 <0.1  0.0 - 0.2 mg/dL Final   Indirect Bilirubin 12/08/2022 NOT CALCULATED  0.3 - 0.9 mg/dL Final   Performed at [facility], [address], [place], [place] [address]   Ammonia 12/08/2022 16  9 - 35 umol/L Final    Performed at [facility], [address], [place], [place] [address]   TSH 12/08/2022 0.172 (L)  0.350 - 4.500 uIU/mL Final   Comment: Performed by a 3rd Generation assay with a functional sensitivity of <=0.01 uIU/mL.  Performed at [facility], [address] [place], [place] [address]    Osmolality, Ur 12/08/2022 442  300 - 900 mOsm/kg Final   Performed at [facility], [address] [place], [place] [address]   Creatinine, Urine 12/08/2022 59  mg/dL Final   Performed at [facility], [address], [place], [place] [address]   Sodium, Ur 12/08/2022 123  mmol/L Final   Performed at [facility], [address] [place], [place] [address]   Sodium 12/08/2022 138  135 - 145 mmol/L Final   Potassium 12/08/2022 3.4 (L)  3.5 - 5.1 mmol/L Final   Chloride 12/08/2022 108  98 - 111 mmol/L Final   CO2 12/08/2022 23  22 - 32 mmol/L Final   Glucose, Bld 12/08/2022 125 (H)  70 - 99 mg/dL Final   Glucose reference range applies only to samples taken after fasting for at least 8 hours.   BUN 12/08/2022 <5 (L)  6 - 20 mg/dL Final   Creatinine, Ser 12/08/2022 1.13  0.61 - 1.24 mg/dL Final   Calcium 29/56/2130 8.3 (L)  8.9 - 10.3 mg/dL Final   GFR, Estimated 12/08/2022 >60  >60 mL/min Final   Comment: (NOTE) Calculated using the CKD-EPI Creatinine Equation (2021)    Anion gap 12/08/2022 7  5 - 15 Final   Performed at [facility], [address], [place], [place] [address]   Osmolality 12/08/2022 320 (H)  275 - 295 mOsm/kg Final   Performed at [facility], [address] [place], [place] [address]   Magnesium 12/09/2022 2.1  1.7 - 2.4 mg/dL Final   Performed at [facility], [address], [place], [place] [address]   Phosphorus 12/09/2022 3.7  2.5 - 4.6 mg/dL Final   Performed at [facility], [address] [place], [place] [address]   Sodium 12/09/2022 138  135 - 145 mmol/L Final   Potassium 12/09/2022 3.3 (L)  3.5 - 5.1 mmol/L Final    Chloride 12/09/2022 108  98 - 111 mmol/L Final   CO2 12/09/2022 23  22 - 32 mmol/L Final   Glucose, Bld 12/09/2022 132 (H)  70 - 99 mg/dL Final   Glucose reference range applies only to samples taken after fasting for at least 8 hours.   BUN 12/09/2022 <5 (L)  6 - 20 mg/dL Final   Creatinine, Ser 12/09/2022 1.08  0.61 - 1.24 mg/dL Final   Calcium 05/23/7251 8.8 (L)  8.9 - 10.3 mg/dL Final   Total Protein 66/44/0347 6.5  6.5 - 8.1 g/dL Final   Albumin 42/59/5638 3.4 (L)  3.5 - 5.0 g/dL Final   AST 75/64/3329 31  15 - 41 U/L Final   ALT 12/09/2022 49 (H)  0 - 44 U/L Final   Alkaline Phosphatase 12/09/2022 66  38 - 126 U/L Final   Total Bilirubin 12/09/2022 0.5  0.3 - 1.2 mg/dL Final   GFR, Estimated 12/09/2022 >60  >60 mL/min Final   Comment: (NOTE) Calculated using the CKD-EPI Creatinine Equation (2021)    Anion gap 12/09/2022 7  5 - 15 Final   Performed at [facility] Lab, [address] [place], [place] [address]   WBC 12/09/2022 5.3  4.0 - 10.5 K/uL Final   RBC 12/09/2022 3.69 (L)  4.22 - 5.81 MIL/uL Final   Hemoglobin 12/09/2022 12.1 (L)  13.0 - 17.0 g/dL Final   HCT 16/60/6301 34.6 (L)  39.0 - 52.0 % Final   MCV 12/09/2022 93.8  80.0 - 100.0 fL Final   MCH 12/09/2022 32.8  26.0 - 34.0 pg Final   MCHC 12/09/2022 35.0  30.0 - 36.0 g/dL Final   RDW 60/63/0160 14.6  11.5 - 15.5 % Final   Platelets 12/09/2022 194  150 - 400 K/uL Final   nRBC 12/09/2022 0.0  0.0 - 0.2 % Final   Performed at [facility] Lab, [address], [place], [place] [address]   Weight 12/09/2022 2,688  oz Final   Height 12/09/2022 68  in Final   BP 12/09/2022 133/95  mmHg Final   S' Lateral 12/09/2022 3.00  cm Final   AR max vel 12/09/2022 2.41  cm2 Final   AV Area VTI 12/09/2022 2.28  cm2 Final   AV Mean grad 12/09/2022 4.0  mmHg Final   AV Peak grad 12/09/2022 5.5  mmHg Final   Ao pk vel 12/09/2022 1.17  m/s Final   Area-P 1/2 12/09/2022 3.42  cm2 Final   AV Area mean vel 12/09/2022 2.22  cm2  Final   Est EF 12/09/2022 60 - 65%   Final   Beta-Hydroxybutyric Acid 12/08/2022 0.10  0.05 - 0.27 mmol/L Final   Performed at [facility] Lab, [address] [place], [place] [address]   Glucose-Capillary 12/08/2022 120 (H)  70 - 99 mg/dL Final   Glucose reference range applies only to samples taken after fasting for at least 8 hours.   Cholesterol 12/09/2022 183  0 - 200 mg/dL Final   Triglycerides 22/06/5425 91  <150 mg/dL Final   HDL 11/11/7626 50  >40 mg/dL Final   Total CHOL/HDL Ratio 12/09/2022 3.7  RATIO Final   VLDL 12/09/2022 18  0 - 40 mg/dL Final   LDL Cholesterol 12/09/2022 115 (H)  0 - 99 mg/dL Final   Comment:        Total Cholesterol/HDL:CHD Risk Coronary Heart Disease Risk Table                     Men   Women  1/2 Average Risk   3.4   3.3  Average Risk       5.0   4.4  2 X Average Risk   9.6   7.1  3 X Average Risk  23.4   11.0        Use the calculated Patient Ratio above and the CHD Risk Table to determine the patient's CHD Risk.        ATP III CLASSIFICATION (LDL):  <100     mg/dL   Optimal  315-176  mg/dL   Near or Above                    Optimal  130-159  mg/dL   Borderline  160-737  mg/dL   High  >106     mg/dL   Very High Performed at [facility] Lab, [address] [place], [place] [address]    Lipoprotein (a) 12/09/2022 47.5 (H)  <75.0 nmol/L Final   Comment: (NOTE) Note:  Values greater than or equal to 75.0 nmol/L may       indicate an independent risk factor for CHD,       but must be evaluated with caution when applied       to non-Caucasian populations due to the       influence of genetic factors on Lp(a) across  ethnicities. Performed At: [facility] [address] [place], [place] [id number] Jolene Schimke MD WU:[id number]    Free T4 12/09/2022 0.78  0.61 - 1.12 ng/dL Final   Comment: (NOTE) Biotin ingestion may interfere with free T4 tests. If the results are inconsistent with the TSH level, previous test  results, or the clinical presentation, then consider biotin interference. If needed, order repeat testing after stopping biotin. Performed at [facility], [address] [place], [place] [address]    D-Dimer, Quant 12/09/2022 0.43  0.00 - 0.50 ug/mL-FEU Final   Comment: (NOTE) At the manufacturer cut-off value of 0.5 g/mL FEU, this assay has a negative predictive value of 95-100%.This assay is intended for use in conjunction with a clinical pretest probability (PTP) assessment model to exclude pulmonary embolism (PE) and deep venous thrombosis (DVT) in outpatients suspected of PE or DVT. Results should be correlated with clinical presentation. Performed at [facility], [address] [place], [place] [address]    T3, Total 12/09/2022 103  71 - 180 ng/dL Final   Comment: (NOTE) Performed At: [facility] [address] [place], [place] [id number] Jolene Schimke MD MW:[id number]    Glucose-Capillary 12/08/2022 108 (H)  70 - 99 mg/dL Final   Glucose reference range applies only to samples taken after fasting for at least 8 hours.   Glucose-Capillary 12/09/2022 122 (H)  70 - 99 mg/dL Final   Glucose reference range applies only to samples taken after fasting for at least 8 hours.   Glucose-Capillary 12/09/2022 115 (H)  70 - 99 mg/dL Final   Glucose reference range applies only to samples taken after fasting for at least 8 hours.   Glucose-Capillary 12/09/2022 148 (H)  70 - 99 mg/dL Final   Glucose reference range applies only to samples taken after fasting for at least 8 hours.   Glucose-Capillary 12/09/2022 109 (H)  70 - 99 mg/dL Final   Glucose reference range applies only to samples taken after fasting for at least 8 hours.  Admission on 12/07/2022, Discharged on 12/08/2022  Component Date Value Ref Range Status   Sodium 12/07/2022 141  135 - 145 mmol/L Final   Potassium 12/07/2022 3.4 (L)  3.5 - 5.1 mmol/L Final   Chloride 12/07/2022 103  98 - 111 mmol/L  Final   CO2 12/07/2022 21 (L)  22 - 32 mmol/L Final   Glucose, Bld 12/07/2022 242 (H)  70 - 99 mg/dL Final   Glucose reference range applies only to samples taken after fasting for at least 8 hours.   BUN 12/07/2022 8  6 - 20 mg/dL Final   Creatinine, Ser 12/07/2022 1.80 (H)  0.61 - 1.24 mg/dL Final   DELTA CHECK NOTED   Calcium 12/07/2022 9.1  8.9 - 10.3 mg/dL Final   Total Protein 72/53/6644 7.7  6.5 - 8.1 g/dL Final   Albumin 03/47/4259 4.1  3.5 - 5.0 g/dL Final   AST 56/38/7564 60 (H)  15 - 41 U/L Final   ALT 12/07/2022 81 (H)  0 - 44 U/L Final   Alkaline Phosphatase 12/07/2022 81  38 - 126 U/L Final   Total Bilirubin 12/07/2022 0.6  0.3 - 1.2 mg/dL Final   GFR, Estimated 12/07/2022 43 (L)  >60 mL/min Final   Comment: (NOTE) Calculated using the CKD-EPI Creatinine Equation (2021)    Anion gap 12/07/2022 17 (H)  5 - 15 Final   Performed at [facility], [address] [place], [place] [address]  Alcohol, Ethyl (B) 12/07/2022 <10  <10 mg/dL Final   Comment: (NOTE) Lowest detectable limit for serum alcohol is 10 mg/dL.  For medical purposes only. Performed at [facility], [address] [place], [place] [address]    Salicylate Lvl 12/07/2022 <7.0 (L)  7.0 - 30.0 mg/dL Final   Performed at [facility], [address], [place], [place] [address]   Acetaminophen (Tylenol), Serum 12/07/2022 <10 (L)  10 - 30 ug/mL Final   Comment: (NOTE) Therapeutic concentrations vary significantly. A range of 10-30 ug/mL  may be an effective concentration for many patients. However, some  are best treated at concentrations outside of this range. Acetaminophen concentrations >150 ug/mL at 4 hours after ingestion  and >50 ug/mL at 12 hours after ingestion are often associated with  toxic reactions.  Performed at [facility], [address] [place], [place] [address]    WBC 12/07/2022 9.7  4.0 - 10.5 K/uL Final   RBC 12/07/2022 4.75  4.22 - 5.81 MIL/uL Final    Hemoglobin 12/07/2022 15.3  13.0 - 17.0 g/dL Final   HCT 41/32/4401 43.0  39.0 - 52.0 % Final   MCV 12/07/2022 90.5  80.0 - 100.0 fL Final   MCH 12/07/2022 32.2  26.0 - 34.0 pg Final   MCHC 12/07/2022 35.6  30.0 - 36.0 g/dL Final   RDW 02/72/5366 14.6  11.5 - 15.5 % Final   Platelets 12/07/2022 213  150 - 400 K/uL Final   nRBC 12/07/2022 0.0  0.0 - 0.2 % Final   Performed at [facility], [address], [place], [place] [address]   Opiates 12/08/2022 NONE DETECTED  NONE DETECTED Final   Cocaine 12/08/2022 NONE DETECTED  NONE DETECTED Final   Benzodiazepines 12/08/2022 POSITIVE (A)  NONE DETECTED Final   Amphetamines 12/08/2022 NONE DETECTED  NONE DETECTED Final   Tetrahydrocannabinol 12/08/2022 NONE DETECTED  NONE DETECTED Final   Barbiturates 12/08/2022 NONE DETECTED  NONE DETECTED Final   Comment: (NOTE) DRUG SCREEN FOR MEDICAL PURPOSES ONLY.  IF CONFIRMATION IS NEEDED FOR ANY PURPOSE, NOTIFY LAB WITHIN 5 DAYS.  LOWEST DETECTABLE LIMITS FOR URINE DRUG SCREEN Drug Class                     Cutoff (ng/mL) Amphetamine and metabolites    1000 Barbiturate and metabolites    200 Benzodiazepine                 200 Opiates and metabolites        300 Cocaine and metabolites        300 THC                            50 Performed at [facility], [address], [place], [place] [address]    Beta-Hydroxybutyric Acid 12/07/2022 0.10  0.05 - 0.27 mmol/L Final   Performed at [facility], [address], [place], [place] [address]   Lactic Acid, Venous 12/07/2022 2.4 (HH)  0.5 - 1.9 mmol/L Final   Comment: CRITICAL RESULT CALLED TO, READ BACK BY AND VERIFIED WITH Birdie Hopes RN @ [phone] 12/07/22 JBUTLER Performed at [facility], [address], [place], [place] [address]    Lactic Acid, Venous 12/08/2022 2.0 (HH)  0.5 - 1.9 mmol/L Final   Comment: CRITICAL VALUE NOTED. VALUE IS CONSISTENT WITH PREVIOUSLY REPORTED/CALLED VALUE Performed at [facility],  [address], [place], [place] [address]    Magnesium 12/07/2022 2.5 (H)  1.7 - 2.4 mg/dL Final   Comment: HEMOLYSIS AT THIS LEVEL MAY AFFECT RESULT Performed at [facility], [address] [place], [place] [address]    Sodium 12/08/2022 142  135 - 145 mmol/L Final   Potassium 12/08/2022 3.2 (L)  3.5 - 5.1 mmol/L Final   Chloride 12/08/2022 110  98 - 111 mmol/L Final   CO2 12/08/2022 19 (L)  22 - 32 mmol/L Final   Glucose, Bld 12/08/2022 158 (H)  70 - 99 mg/dL Final   Glucose reference range applies only to samples taken after fasting for at least 8 hours.   BUN 12/08/2022 8  6 - 20 mg/dL Final   Creatinine, Ser 12/08/2022 1.70 (H)  0.61 - 1.24 mg/dL Final   Calcium 29/56/2130 7.9 (L)  8.9 - 10.3 mg/dL Final   GFR, Estimated 12/08/2022 46 (L)  >60 mL/min Final   Comment: (NOTE) Calculated using the CKD-EPI Creatinine Equation (2021)    Anion gap 12/08/2022 13  5 - 15 Final   Performed at [facility], [address], [place], [place] [address]   Osmolality 12/08/2022 345 (HH)  275 - 295 mOsm/kg Final   Comment: REPEATED TO VERIFY CRITICAL RESULT CALLED TO, READ BACK BY AND VERIFIED WITH: GASQUE,S RN 12/08/2022 AT 0238 SKEEN,P Performed at [facility], [address] [place], [place] [address]   No results displayed because visit has over 200 results.    Admission on 09/29/2022, Discharged on 10/01/2022  Component Date Value Ref Range Status   Glucose-Capillary 09/30/2022 238 (H)  70 - 99 mg/dL Final   Glucose reference range applies only to samples taken after fasting for at least 8 hours.   Glucose-Capillary 09/30/2022 159 (H)  70 - 99 mg/dL Final   Glucose reference range applies only to samples taken after fasting for at least 8 hours.   Glucose-Capillary 09/30/2022 191 (H)  70 - 99 mg/dL Final   Glucose reference range applies only to samples taken after fasting for at least 8 hours.   Glucose-Capillary 09/30/2022 275 (H)  70 - 99 mg/dL Final   Glucose  reference range applies only to samples taken after fasting for at least 8 hours.   Glucose-Capillary 09/30/2022 247 (H)  70 - 99 mg/dL Final   Glucose reference range applies only to samples taken after fasting for at least 8 hours.   Glucose-Capillary 09/30/2022 236 (H)  70 - 99 mg/dL Final   Glucose reference range applies only to samples taken after fasting for at least 8 hours.   Glucose-Capillary 10/01/2022 156 (H)  70 - 99 mg/dL Final   Glucose reference range applies only to samples taken after fasting for at least 8 hours.   Glucose-Capillary 10/01/2022 186 (H)  70 - 99 mg/dL Final   Glucose reference range applies only to samples taken after fasting for at least 8 hours.   Glucose-Capillary 10/01/2022 139 (H)  70 - 99 mg/dL Final   Glucose reference range applies only to samples taken after fasting for at least 8 hours.  Admission on 09/29/2022, Discharged on 09/29/2022  Component Date Value Ref Range Status   WBC 09/29/2022 5.7  4.0 - 10.5 K/uL Final   RBC 09/29/2022 4.72  4.22 - 5.81 MIL/uL Final   Hemoglobin 09/29/2022 15.1  13.0 - 17.0 g/dL Final   HCT 95/28/4132 43.1  39.0 -  52.0 % Final   MCV 09/29/2022 91.3  80.0 - 100.0 fL Final   MCH 09/29/2022 32.0  26.0 - 34.0 pg Final   MCHC 09/29/2022 35.0  30.0 - 36.0 g/dL Final   RDW 62/95/2841 13.3  11.5 - 15.5 % Final   Platelets 09/29/2022 157  150 - 400 K/uL Final   nRBC 09/29/2022 0.0  0.0 - 0.2 % Final   Performed at [facility], [address], [place], [place] [address]   Neutrophils Relative % 09/29/2022 71  % Final   Neutro Abs 09/29/2022 4.1  1.7 - 7.7 K/uL Final   Lymphocytes Relative 09/29/2022 15  % Final   Lymphs Abs 09/29/2022 0.8  0.7 - 4.0 K/uL Final   Monocytes Relative 09/29/2022 12  % Final   Monocytes Absolute 09/29/2022 0.7  0.1 - 1.0 K/uL Final   Eosinophils Relative 09/29/2022 1  % Final   Eosinophils Absolute 09/29/2022 0.1  0.0 - 0.5 K/uL Final   Basophils Relative 09/29/2022 1  % Final    Basophils Absolute 09/29/2022 0.0  0.0 - 0.1 K/uL Final   Immature Granulocytes 09/29/2022 0  % Final   Abs Immature Granulocytes 09/29/2022 0.02  0.00 - 0.07 K/uL Final   Performed at [facility], [address] [place], [place] [address]   Sodium 09/29/2022 133 (L)  135 - 145 mmol/L Final   Potassium 09/29/2022 3.7  3.5 - 5.1 mmol/L Final   Chloride 09/29/2022 101  98 - 111 mmol/L Final   CO2 09/29/2022 23  22 - 32 mmol/L Final   Glucose, Bld 09/29/2022 206 (H)  70 - 99 mg/dL Final   Glucose reference range applies only to samples taken after fasting for at least 8 hours.   BUN 09/29/2022 11  6 - 20 mg/dL Final   Creatinine, Ser 09/29/2022 0.65  0.61 - 1.24 mg/dL Final   Calcium 53/66/4403 9.2  8.9 - 10.3 mg/dL Final   Total Protein 47/42/5956 7.0  6.5 - 8.1 g/dL Final   Albumin 38/75/6433 3.7  3.5 - 5.0 g/dL Final   AST 29/51/8841 126 (H)  15 - 41 U/L Final   ALT 09/29/2022 161 (H)  0 - 44 U/L Final   Alkaline Phosphatase 09/29/2022 115  38 - 126 U/L Final   Total Bilirubin 09/29/2022 0.7  0.3 - 1.2 mg/dL Final   GFR, Estimated 09/29/2022 >60  >60 mL/min Final   Comment: (NOTE) Calculated using the CKD-EPI Creatinine Equation (2021)    Anion gap 09/29/2022 9  5 - 15 Final   Performed at [facility], [address], [place], [place] [address]   Alcohol, Ethyl (B) 09/29/2022 <10  <10 mg/dL Final   Comment: (NOTE) Lowest detectable limit for serum alcohol is 10 mg/dL.  For medical purposes only. Performed at [facility], [address] [place], [place] [address]    Magnesium 09/29/2022 2.2  1.7 - 2.4 mg/dL Final   Performed at [facility], [address], [place], [place] [address]   Troponin I (High Sensitivity) 09/29/2022 6  <18 ng/L Final   Comment: (NOTE) Elevated high sensitivity troponin I (hsTnI) values and significant  changes across serial measurements may suggest ACS but many other  chronic and acute conditions are known to elevate hsTnI  results.  Refer to the "Links" section for chest pain algorithms and additional  guidance. Performed at [facility], [address], [place], [place] [address]  Troponin I (High Sensitivity) 09/29/2022 5  <18 ng/L Final   Comment: (NOTE) Elevated high sensitivity troponin I (hsTnI) values and significant  changes across serial measurements may suggest ACS but many other  chronic and acute conditions are known to elevate hsTnI results.  Refer to the "Links" section for chest pain algorithms and additional  guidance. Performed at [facility], [address], [place], [place] [address]    Glucose-Capillary 09/29/2022 225 (H)  70 - 99 mg/dL Final   Glucose reference range applies only to samples taken after fasting for at least 8 hours.  Admission on 09/27/2022, Discharged on 09/29/2022  Component Date Value Ref Range Status   Glucose-Capillary 09/27/2022 271 (H)  70 - 99 mg/dL Final   Glucose reference range applies only to samples taken after fasting for at least 8 hours.   Glucose-Capillary 09/27/2022 252 (H)  70 - 99 mg/dL Final   Glucose reference range applies only to samples taken after fasting for at least 8 hours.   Glucose-Capillary 09/27/2022 284 (H)  70 - 99 mg/dL Final   Glucose reference range applies only to samples taken after fasting for at least 8 hours.   Glucose-Capillary 09/27/2022 226 (H)  70 - 99 mg/dL Final   Glucose reference range applies only to samples taken after fasting for at least 8 hours.   Glucose-Capillary 09/27/2022 342 (H)  70 - 99 mg/dL Final   Glucose reference range applies only to samples taken after fasting for at least 8 hours.   Glucose-Capillary 09/28/2022 255 (H)  70 - 99 mg/dL Final   Glucose reference range applies only to samples taken after fasting for at least 8 hours.   Glucose-Capillary 09/28/2022 324 (H)  70 - 99 mg/dL Final   Glucose reference range applies only to samples taken after fasting for at least 8 hours.    Glucose-Capillary 09/28/2022 309 (H)  70 - 99 mg/dL Final   Glucose reference range applies only to samples taken after fasting for at least 8 hours.   Glucose-Capillary 09/28/2022 311 (H)  70 - 99 mg/dL Final   Glucose reference range applies only to samples taken after fasting for at least 8 hours.   Glucose-Capillary 09/29/2022 305 (H)  70 - 99 mg/dL Final   Glucose reference range applies only to samples taken after fasting for at least 8 hours.  Admission on 09/26/2022, Discharged on 09/27/2022  Component Date Value Ref Range Status   WBC 09/26/2022 8.1  4.0 - 10.5 K/uL Final   RBC 09/26/2022 4.88  4.22 - 5.81 MIL/uL Final   Hemoglobin 09/26/2022 15.9  13.0 - 17.0 g/dL Final   HCT 29/56/2130 44.4  39.0 - 52.0 % Final   MCV 09/26/2022 91.0  80.0 - 100.0 fL Final   MCH 09/26/2022 32.6  26.0 - 34.0 pg Final   MCHC 09/26/2022 35.8  30.0 - 36.0 g/dL Final   RDW 86/57/8469 13.5  11.5 - 15.5 % Final   Platelets 09/26/2022 150  150 - 400 K/uL Final   nRBC 09/26/2022 0.0  0.0 - 0.2 % Final   Performed at [facility], [address], [place], [place] [address]   Sodium 09/26/2022 131 (L)  135 - 145 mmol/L Final   Potassium 09/26/2022 4.2  3.5 - 5.1 mmol/L Final   Chloride 09/26/2022 98  98 - 111 mmol/L Final   CO2 09/26/2022 18 (L)  22 - 32 mmol/L Final   Glucose, Bld 09/26/2022 296 (H)  70 - 99 mg/dL Final   Glucose reference range applies only to samples taken after fasting for at least 8 hours.   BUN 09/26/2022 18  6 - 20 mg/dL Final   Creatinine, Ser 09/26/2022 0.88  0.61 - 1.24 mg/dL Final   Calcium 41/32/4401 9.9  8.9 - 10.3 mg/dL Final   Total Protein 02/72/5366 7.1  6.5 - 8.1 g/dL Final   Albumin 44/07/4740 3.8  3.5 - 5.0 g/dL Final   AST 59/56/3875 112 (H)  15 - 41 U/L Final   ALT 09/26/2022 172 (H)  0 - 44 U/L Final   Alkaline Phosphatase 09/26/2022 162 (H)  38 - 126 U/L Final   Total Bilirubin 09/26/2022 0.6  0.3 - 1.2 mg/dL Final   GFR, Estimated 09/26/2022 >60  >60  mL/min Final   Comment: (NOTE) Calculated using the CKD-EPI Creatinine Equation (2021)    Anion gap 09/26/2022 15  5 - 15 Final   Performed at [facility], [address], [place], [place] [address]   Glucose-Capillary 09/26/2022 281 (H)  70 - 99 mg/dL Final   Glucose reference range applies only to samples taken after fasting for at least 8 hours.   Alcohol, Ethyl (B) 09/26/2022 <10  <10 mg/dL Final   Comment: (NOTE) Lowest detectable limit for serum alcohol is 10 mg/dL.  For medical purposes only. Performed at [facility], [address], [place], [place] [address]    Color, Urine 09/26/2022 YELLOW  YELLOW Final   APPearance 09/26/2022 CLEAR  CLEAR Final   Specific Gravity, Urine 09/26/2022 1.023  1.005 - 1.030 Final   pH 09/26/2022 5.0  5.0 - 8.0 Final   Glucose, UA 09/26/2022 >=500 (A)  NEGATIVE mg/dL Final   Hgb urine dipstick 09/26/2022 NEGATIVE  NEGATIVE Final   Bilirubin Urine 09/26/2022 NEGATIVE  NEGATIVE Final   Ketones, ur 09/26/2022 NEGATIVE  NEGATIVE mg/dL Final   Protein, ur 41/66/0630 NEGATIVE  NEGATIVE mg/dL Final   Nitrite 16/05/930 NEGATIVE  NEGATIVE Final   Leukocytes,Ua 09/26/2022 NEGATIVE  NEGATIVE Final   RBC / HPF 09/26/2022 0-5  0 - 5 RBC/hpf Final   WBC, UA 09/26/2022 0-5  0 - 5 WBC/hpf Final   Bacteria, UA 09/26/2022 RARE (A)  NONE SEEN Final   Squamous Epithelial / HPF 09/26/2022 0-5  0 - 5 /HPF Final   Performed at [facility], [address], [place], [place] [address]   Opiates 09/26/2022 NONE DETECTED  NONE DETECTED Final   Cocaine 09/26/2022 NONE DETECTED  NONE DETECTED Final   Benzodiazepines 09/26/2022 POSITIVE (A)  NONE DETECTED Final   Amphetamines 09/26/2022 NONE DETECTED  NONE DETECTED Final   Tetrahydrocannabinol 09/26/2022 NONE DETECTED  NONE DETECTED Final   Barbiturates 09/26/2022 NONE DETECTED  NONE DETECTED Final   Comment: (NOTE) DRUG SCREEN FOR MEDICAL PURPOSES ONLY.  IF CONFIRMATION IS NEEDED FOR ANY  PURPOSE, NOTIFY LAB WITHIN 5 DAYS.  LOWEST DETECTABLE LIMITS FOR URINE DRUG SCREEN Drug Class                     Cutoff (ng/mL) Amphetamine and metabolites    1000 Barbiturate and metabolites    200 Benzodiazepine                 200 Opiates and metabolites        300 Cocaine and metabolites        300 THC  50 Performed at [facility], [address], [place], [place] [address]    pH, Ven 09/26/2022 7.437 (H)  7.25 - 7.43 Final   pCO2, Ven 09/26/2022 31.6 (L)  44 - 60 mmHg Final   pO2, Ven 09/26/2022 87 (H)  32 - 45 mmHg Final   Bicarbonate 09/26/2022 21.3  20.0 - 28.0 mmol/L Final   TCO2 09/26/2022 22  22 - 32 mmol/L Final   O2 Saturation 09/26/2022 97  % Final   Acid-base deficit 09/26/2022 2.0  0.0 - 2.0 mmol/L Final   Sodium 09/26/2022 133 (L)  135 - 145 mmol/L Final   Potassium 09/26/2022 3.6  3.5 - 5.1 mmol/L Final   Calcium, Ion 09/26/2022 1.19  1.15 - 1.40 mmol/L Final   HCT 09/26/2022 42.0  39.0 - 52.0 % Final   Hemoglobin 09/26/2022 14.3  13.0 - 17.0 g/dL Final   Sample type 65/78/4696 VENOUS   Final   Glucose-Capillary 09/26/2022 294 (H)  70 - 99 mg/dL Final   Glucose reference range applies only to samples taken after fasting for at least 8 hours.  There may be more visits with results that are not included.    Blood Alcohol level:  Lab Results  Component Value Date   ETH <10 12/10/2022   ETH <10 12/10/2022    Metabolic Disorder Labs: Lab Results  Component Value Date   HGBA1C 10.8 (H) 09/25/2022   MPG 263.26 09/25/2022   MPG 151.33 05/08/2020   No results found for: "PROLACTIN" Lab Results  Component Value Date   CHOL 183 12/09/2022   TRIG 91 12/09/2022   HDL 50 12/09/2022   CHOLHDL 3.7 12/09/2022   VLDL 18 12/09/2022   LDLCALC 115 (H) 12/09/2022   LDLCALC 60 09/25/2022    Therapeutic Lab Levels: No results found for: "LITHIUM" No results found for: "VALPROATE" No results found for: "CBMZ"  Physical  Findings   PHQ2-9    Flowsheet Row ED from 12/11/2022 in [facility] ED from 09/29/2022 in [facility] ED from 09/27/2022 in [facility] ED from 09[facility]25/2022 in [facility] Office Visit from 01/15/2022 in [facility]  PHQ-2 Total Score 1 0 0 2 0  PHQ-9 Total Score -- 4 -- -- --      Flowsheet Row ED from 12/11/2022 in [facility] Most recent reading at 12/11/2022  4:57 AM ED from 12/10/2022 in [facility] Emergency Department at [facility] Most recent reading at 12/10/2022  6:38 PM ED from 12/10/2022 in [facility] Emergency Department at [facility] Most recent reading at 12/10/2022  3:52 PM  C-SSRS RISK CATEGORY No Risk No Risk No Risk        Musculoskeletal  Strength & Muscle Tone: within normal limits Gait & Station: normal Patient leans: Front  Psychiatric Specialty Exam  Presentation  General Appearance:  Appropriate for Environment; Casual; Fairly Groomed  Eye Contact: Good  Speech: Normal Rate (Noteable accent with limited English vocabulary)  Speech Volume: Normal  Handedness: Right   Mood and Affect  Mood: -- ("Good")  Affect: Appropriate; Congruent; Full Range   Thought Process  Thought Processes: Coherent; Goal Directed; Linear  Descriptions of Associations:Intact  Orientation:Full (Time, Place and Person)  Thought Content:Logical; WDL  Diagnosis of Schizophrenia or Schizoaffective disorder in past: No    Hallucinations:Hallucinations: None  Ideas of Reference:None  Suicidal Thoughts:Suicidal Thoughts: No  Homicidal Thoughts:Homicidal Thoughts: No   Sensorium  Memory: Immediate Fair; Recent Fair; Remote Fair  Judgment: Fair  Insight: Fair   Executive Functions  Concentration: Good  Attention Span: Good  Recall: Good  Fund of  Knowledge: Fair  Language: Other (comment) (Primary language is Spanish)   Psychomotor Activity  Psychomotor Activity: Psychomotor Activity: Normal   Assets  Assets: Communication Skills; Desire for Improvement; Housing; Social Support   Sleep  Sleep: Sleep: Good Number of Hours of Sleep: 8   Nutritional Assessment (For OBS and FBC admissions only) Has the patient had a weight loss or gain of 10 pounds or more in the last 3 months?: No Has the patient had a decrease in food intake/or appetite?: No Does the patient have dental problems?: No Does the patient have eating habits or behaviors that may be indicators of an eating disorder including binging or inducing vomiting?: No Has the patient recently lost weight without trying?: 0 Has the patient been eating poorly because of a decreased appetite?: 0 Malnutrition Screening Tool Score: 0    Physical Exam  Physical Exam Vitals and nursing note reviewed.  Constitutional:      General: He is not in acute distress.    Appearance: He is not diaphoretic.  HENT:     Head: Normocephalic and atraumatic.  Eyes:     Conjunctiva/sclera: Conjunctivae normal.  Pulmonary:     Effort: Pulmonary effort is normal.  Musculoskeletal:     Cervical back: Normal range of motion.  Neurological:     Mental Status: He is alert and oriented to person, place, and time.    Review of Systems  Constitutional:  Negative for diaphoresis.  Eyes: Negative.   Respiratory:  Negative for shortness of breath.   Cardiovascular:  Negative for chest pain.  Gastrointestinal: Negative.   Genitourinary: Negative.   Skin:  Negative for itching and rash.  Neurological:  Negative for dizziness, tingling, tremors and headaches.  Psychiatric/Behavioral:  Positive for substance abuse. Negative for hallucinations and suicidal ideas. The patient is not nervous/anxious.    Blood pressure 100/60, pulse 75, temperature 97.9 F (36.6 C), temperature source  Oral, resp. rate 18, SpO2 97%. There is no height or weight on file to calculate BMI.  Treatment Plan Summary: Daily contact with patient to assess and evaluate symptoms and progress in treatment  #Alcohol use disorder: #History of agitation: Patient states desire to to go to an inpatient treatment facility after finishing treatment at [facility]. Two most recent CIWA scores both 0. Will continue Ativan taper, now on day 1 of 4, and assess for withdrawal symptoms. Will work with social work to find inpatient placement, patient preference in [place].  On collateral called his son, it appears that patient is going through mood changes related to an adjustment disorder (divorce) complicated by worsening substance use.  Will explore antidepressant medications and outpatient psychiatric follow-up throughout course of stay.  Patient denied SI during interview today, does not currently appear to be a danger to himself.  PHQ-2: 1.  However his sons recounting of two "performative" attempts to injure himself/end his life are concerning.  Unclear if he was acutely intoxicated during these episodes.  We will further discuss with patient. He will need an interpreter on further workup.  #Adjustment disorder, severe #Substance-induced mood disorder, depressive type - Will explore extent of depression in the setting of sobriety, and act accordingly. -Consider starting SSRI.  Could also adjunct with low-dose lithium to 150 mg in the setting of  normal kidney function.   CIWA Ativan protocol initiated: -lorazepam (Ativan) 1 mg 4 times daily x4 doses, 1 mg 3 times daily x3 doses, 1 mg 2 times daily x2 doses, 1 mg daily x1 dose -lorazepam (Ativan) 1 mg every 6 hours as needed for CIWA greater than 10; Hydroxyzine 25 mg for CIWA less than 10 -Multivitamin with minerals 1 tablet daily -Ondansetron disintegrating tablet 4 mg every 6 as needed/nausea or vomiting -Loperamide 2 to 4 mg oral as needed/diarrhea or loose  stools -Thiamine injection 100 mg IM once -Thiamine tablet 500 mg Q8 hours  Agitation PRNs as follows: - Olanzapine Zydis 10 mg sublingual every 8 hours as needed for agitation. - Ativan 1 mg p.o. as needed for anxiety/severe agitation. - Ziprasidone 20 mg IM as needed for agitation.  Chronic medical conditions Hypertension and Hyperlipidemia - will continue to hold statin and aspirin at this time. Diabetes Mellitus Type 2 - Will switch from insulin aspartate to metformin 1500 nightly (home dose).  While home med Rx says he should take them in the morning, patient says he is regularly taking it at night. Will consider insulin regimen if glucose blood levels regularly greater than 150. -Hypoglycemia protocol -A1c ordered  Ezekiel Slocumb, MS3 [employer] [employer] [facility]  12/11/2022 11:55 AM  I personally was present and performed or re-performed the history, physical exam and medical decision-making activities of this service and have verified that the service and findings are accurately documented in the student's note.  Tomie China, MD, PGY-1

## 2022-12-11 NOTE — BH IP Treatment Plan (Signed)
Interdisciplinary Treatment and Diagnostic Plan Update  12/11/2022 Time of Session: 11:00am Edi Gorniak MRN: [id number]  Diagnosis:  Final diagnoses:  Alcohol abuse  Ineffective coping     Current Medications:  Current Facility-Administered Medications  Medication Dose Route Frequency Provider Last Rate Last Admin   acetaminophen (TYLENOL) tablet 650 mg  650 mg Oral Q6H PRN [name], NP       alum & mag hydroxide-simeth (MAALOX/MYLANTA) 200-200-20 MG/5ML suspension 30 mL  30 mL Oral Q4H PRN [name], NP       hydrOXYzine (ATARAX) tablet 25 mg  25 mg Oral Q6H PRN [name], NP       loperamide (IMODIUM) capsule 2-4 mg  2-4 mg Oral PRN [name], NP       LORazepam (ATIVAN) tablet 1 mg  1 mg Oral Q6H PRN [name], NP       OLANZapine zydis (ZYPREXA) disintegrating tablet 10 mg  10 mg Oral Q8H PRN [name], NP       And   LORazepam (ATIVAN) tablet 1 mg  1 mg Oral PRN [name], NP       And   ziprasidone (GEODON) injection 20 mg  20 mg Intramuscular PRN [name], NP       LORazepam (ATIVAN) tablet 1 mg  1 mg Oral QID [name], NP   1 mg at 12/11/22 1318   Followed by   Melene Muller ON 12/12/2022] LORazepam (ATIVAN) tablet 1 mg  1 mg Oral TID [name], NP       Followed by   Melene Muller ON 12/13/2022] LORazepam (ATIVAN) tablet 1 mg  1 mg Oral BID [name], NP       Followed by   Melene Muller ON 12/15/2022] LORazepam (ATIVAN) tablet 1 mg  1 mg Oral Daily [name], NP       magnesium hydroxide (MILK OF MAGNESIA) suspension 30 mL  30 mL Oral Daily PRN [name], NP       Melene Muller ON 12/12/2022] metFORMIN (GLUCOPHAGE-XR) 24 hr tablet 1,500 mg  1,500 mg Oral Q breakfast Tomie China, MD       multivitamin with minerals tablet 1 tablet  1 tablet Oral Daily [name], NP   1 tablet at 12/11/22 0912   ondansetron (ZOFRAN-ODT) disintegrating tablet 4 mg  4 mg Oral Q6H PRN [name], NP       Melene Muller ON 12/12/2022] thiamine  (VITAMIN B1) tablet 100 mg  100 mg Oral Daily [name], NP       Current Outpatient Medications  Medication Sig Dispense Refill   chlordiazePOXIDE (LIBRIUM) 10 MG capsule Take 1 capsule (10 mg total) by mouth 4 (four) times daily for 3 days, THEN 1 capsule (10 mg total) 3 (three) times daily for 3 days, THEN 1 capsule (10 mg total) 2 (two) times daily for 3 days, THEN 1 capsule (10 mg total) daily for 3 days. (Patient not taking: Reported on 12/10/2022) 30 capsule 0   folic acid (FOLVITE) 1 MG tablet Take 1 tablet (1 mg total) by mouth daily. 30 tablet 0   metFORMIN (GLUCOPHAGE-XR) 750 MG 24 hr tablet Take 2 tablets (1,500 mg total) by mouth daily with breakfast. (Patient not taking: Reported on 12/08/2022) 60 tablet 0   Multiple Vitamin (MULTIVITAMIN WITH MINERALS) TABS tablet Take 1 tablet by mouth daily. 130 tablet 0   thiamine (VITAMIN B1) 100 MG tablet Take 1 tablet (100 mg total) by mouth daily. 30 tablet 0  PTA Medications: Prior to Admission medications   Medication Sig Start Date End Date Taking? Authorizing Provider  chlordiazePOXIDE (LIBRIUM) 10 MG capsule Take 1 capsule (10 mg total) by mouth 4 (four) times daily for 3 days, THEN 1 capsule (10 mg total) 3 (three) times daily for 3 days, THEN 1 capsule (10 mg total) 2 (two) times daily for 3 days, THEN 1 capsule (10 mg total) daily for 3 days. Patient not taking: Reported on 12/10/2022 12/06/22 12/18/22  Elgergawy, Leana Roe, MD  folic acid (FOLVITE) 1 MG tablet Take 1 tablet (1 mg total) by mouth daily. 12/06/22   Elgergawy, Leana Roe, MD  metFORMIN (GLUCOPHAGE-XR) 750 MG 24 hr tablet Take 2 tablets (1,500 mg total) by mouth daily with breakfast. Patient not taking: Reported on 12/08/2022 12/06/22 01/05/23  Elgergawy, Leana Roe, MD  Multiple Vitamin (MULTIVITAMIN WITH MINERALS) TABS tablet Take 1 tablet by mouth daily. 12/06/22   Elgergawy, Leana Roe, MD  thiamine (VITAMIN B1) 100 MG tablet Take 1 tablet (100 mg total) by mouth daily. 12/10/22    Elgergawy, Leana Roe, MD    Patient Stressors: Substance abuse   Other: Boredom which results to alcohol use    Patient Strengths: Average or above average intelligence  General fund of knowledge  Motivation for treatment/growth  Supportive family/friends  Work skills   Treatment Modalities: Medication Management, Group therapy, Case management,  1 to 1 session with clinician, Psychoeducation, Recreational therapy.   Physician Treatment Plan for Primary and Secondary Diagnosis:  Final diagnoses:  Alcohol abuse  Ineffective coping   Long Term Goal(s): Improvement in symptoms so as ready for discharge  Short Term Goals: Patient will verbalize feelings in meetings with treatment team members. Patient will attend at least of 50% of the groups daily. Pt will complete the PHQ9 on admission, day 3 and discharge. Patient will participate in completing the [place] Suicide Severity Rating Scale Patient will score a low risk of violence for 24 hours prior to discharge Patient will take medications as prescribed daily.  Medication Management: Evaluate patient's response, side effects, and tolerance of medication regimen.  Therapeutic Interventions: 1 to 1 sessions, Unit Group sessions and Medication administration.  Evaluation of Outcomes: Progressing  LCSW Treatment Plan for Primary Diagnosis:  Final diagnoses:  Alcohol abuse  Ineffective coping    Long Term Goal(s): Safe transition to appropriate next level of care at discharge.  Short Term Goals: Facilitate acceptance of mental health diagnosis and concerns through verbal commitment to aftercare plan and appointments at discharge., Patient will identify one social support prior to discharge to aid in patient's recovery., Patient will attend AA/NA groups as scheduled., Identify minimum of 2 triggers associated with mental health/substance abuse issues with treatment team members., and Increase skills for wellness and recovery by  attending 50% of scheduled groups.  Therapeutic Interventions: Assess for all discharge needs, 1 to 1 time with Child psychotherapist, Explore available resources and support systems, Assess for adequacy in community support network, Educate family and significant other(s) on suicide prevention, Complete Psychosocial Assessment, Interpersonal group therapy.  Evaluation of Outcomes: Progressing   Progress in Treatment: Attending groups: Yes. Participating in groups: Yes. Taking medication as prescribed: Yes. Toleration medication: Yes. Family/Significant other contact made: Yes, individual(s) contacted:  Patient provided permission for MD to speak with his son as needed.  Patient understands diagnosis: Yes. Discussing patient identified problems/goals with staff: Yes. Medical problems stabilized or resolved: Yes. Denies suicidal/homicidal ideation: Yes. Issues/concerns per patient self-inventory: Yes. Other: alcohol use  and need for further treatment in order to save his family   New problem(s) identified: No, Describe:  other than reported on admission.   New Short Term/Long Term Goal(s): Safe transition to appropriate next level of care at discharge, Engage patient in therapeutic group addressing interpersonal concerns. Engage patient in aftercare planning with referrals and resources, Increase ability to appropriately verbalize feelings, Facilitate acceptance of mental health diagnosis and concerns and Identify triggers associated with mental health/substance abuse issues.   Patient Goals: Patient is seeking residential placement at this time for substance use. Patient is hoping to receive treatment here in [place].   Discharge Plan or Barriers: LCSW will send referrals out for review for residential placement. Updates will be provided as received.    Reason for Continuation of Hospitalization: Withdrawal symptoms  Estimated Length of Stay: 3-5 days  Last 3 [place] Suicide Severity Risk  Score: Flowsheet Row ED from 12/11/2022 in [facility] Most recent reading at 12/11/2022  4:57 AM ED from 12/10/2022 in [facility] Emergency Department at [facility] Most recent reading at 12/10/2022  6:38 PM ED from 12/10/2022 in [facility] Emergency Department at [facility] Most recent reading at 12/10/2022  3:52 PM  C-SSRS RISK CATEGORY No Risk No Risk No Risk       Last PHQ 2/9 Scores:    12/11/2022    3:56 AM 10/01/2022    2:44 PM 09/30/2022    2:11 AM  Depression screen PHQ 2/9  Decreased Interest 1 0 1  Down, Depressed, Hopeless 0 0 1  PHQ - 2 Score 1 0 2  Altered sleeping   1  Tired, decreased energy   0  Change in appetite   0  Feeling bad or failure about yourself  2  0  Trouble concentrating   1  Moving slowly or fidgety/restless   0  Suicidal thoughts   0  PHQ-9 Score   4  Difficult doing work/chores   Somewhat difficult    Scribe for Treatment Team: Lenny Pastel 12/11/2022 2:31 PM

## 2022-12-11 NOTE — Group Note (Signed)
Group Topic: Balance in Life  Group Date: 12/11/2022 Start Time: 1130 End Time: 1203 Facilitators: Vonzell Schlatter B  Department: [facility]  Number of Participants: 2  Group Focus: daily focus Treatment Modality:  Psychoeducation Interventions utilized were problem solving Purpose: express feelings  Name: Trestan Vahle Date of Birth: 1965/12/05  MR: [id number]    Level of Participation: minimal Quality of Participation: attentive and cooperative Interactions with others: gave feedback Mood/Affect: positive Triggers (if applicable): n/a Cognition: coherent/clear Progress: Moderate Response: n/a Plan: follow-up needed  Patients Problems:  Patient Active Problem List   Diagnosis Date Noted   Alcohol withdrawal (HCC) 12/08/2022   Abnormal TSH 12/08/2022   Hypokalemia 12/08/2022   CKD (chronic kidney disease), stage III (HCC) 12/08/2022   Alcohol use disorder 12/01/2022   Frequent falls 12/01/2022   Elevated liver enzymes    Alcohol abuse    Liver failure (HCC) 05/07/2020   Alcoholic hepatitis    Hyponatremia    Polycythemia    Hemoglobin A1c less than 7.0% 07/16/2019   Neck pain 03/31/2019   Muscle strain 03/31/2019   Undifferentiated abdominal pain 03/31/2019   Gastroesophageal reflux disease without esophagitis 11/24/2018   Chest discomfort 11/24/2018   Essential hypertension 11/24/2018   Pre-diabetes 11/24/2018   Hyperlipidemia 10/06/2016   Chest pain 09/26/2016   Uncontrolled type 2 diabetes mellitus with hyperglycemia, without long-term current use of insulin (HCC) 09/26/2016

## 2022-12-11 NOTE — ED Provider Notes (Signed)
Facility Based Crisis Admission H&P  Date: 12/11/22 Patient Name: Derek Cruz MRN: [id number] Chief Complaint: increase alcohol intake  Diagnoses:  Final diagnoses:  Alcohol abuse  Ineffective coping    HPI: Derek Cruz, 57 y/o male with a history of alcohol abuse, seizure activity presented to [facility] voluntarily after medically cleared from the ED.  Patient was seen earlier and sent to the ED for medical clearance.  Patient does speak [place] and was assessed without having to use an interpreter.  Per the patient he drinks like a case of beer each day most days.  According to patient he had tried detox a couple months ago but relapsed.   Please see prior notes: Patient assessed face-to-face by this provider, consulted with Dr. Lucianne Muss, and chart reviewed on 12/10/22. On evaluation, Derek Cruz is seated in assessment area in no acute distress. Patient is alert and oriented x4, cooperative and pleasant. Speech is clear and coherent, normal rate and volume. Eye contact is good. Mood is anxious with congruent affect. Patient appears tremulous and ambulates with a slow gait. Thought process is coherent with logical thought content. Patient denies suicidal and homicidal ideations and easily contracts verbally for safety. Patient denies a history of suicide attempts or self-harm. Patient reports a past psychiatric hospitalization at [facility] in May 2024 for alcohol detox. Per chart review, patient was admitted to [facility] from 12/01/22-12/06/22 related to a fall in the setting of alcohol use and alcohol withdrawal symptoms/alcohol poisoning. Patient was discharged on 12/06/22 with a Librium taper but did not take any of it. Per chart review, patient then presented to [facility] on 12/07/22 via EMS after apparently drinking 6 bottles of isopropyl alcohol in an effort to become intoxicated and was discharged on 12/08/22 as patient was not interested in stopping alcohol use  and declined Librium taper. Per chart review, patient presented to [facility] on 12/08/22 seeking alcohol detox, was having chest pain and severe alcohol withdrawal symptoms (CIWA 33), transferred to [facility], admitted to the step down unit, and then discharged on 12/09/22 per his request with substance use treatment resources and instructions to continue Librium taper at home. It is documented that patient has a history of withdrawal seizures, severe DTs, ICU admissions, and intubations related to his alcohol use/withdrawal. Patient reports he immediately resumed drinking alcohol after being discharged from the hospital yesterday. Patient estimates he has consumed 3 "big cans" of beer since yesterday. Per [facility] triage screening "pt reports consuming two 12oz beers about 1 hour ago." Patient reports he did not continue his Librium taper at home after being discharged. Patient denies auditory and visual hallucinations. Patient denies symptoms of paranoia. Patient is able to converse coherently with goal-directed thoughts and no distractibility or preoccupation. Objectively, there is no evidence of psychosis/mania, delusional thinking, or indication that patient is responding to internal or external stimuli.   Patient reports fair sleep (6-8 hours/night) and good appetite. Patient lives with his wife, son, and 2 daughters in [place] and denies access to weapons/firearms. 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 Patient reports daily alcohol use for "most of my life." Patient reports typically consuming a 12-pack of beer daily, last use today. Patient denies current withdrawal symptoms although he appears anxious and tremulous throughout assessment. Patient denies use of other illicit substances. Patient denies having current outpatient psychiatric services in place for therapy or medication management. Patient states he wants to stop drinking alcohol and  is interested  in alcohol detox and a residential substance use treatment program.    Face-to-face observation of patient, patient is alert and oriented x 4, speech is clear maintain eye contact.  Patient was able to answer all questions appropriately and understand English and spoke English enough to be assessed.  Patient denies SI, HI, AVH or paranoia.  Patient endorsed drinking a case of beer each day.  Patient lives with family.  Failure detox multiple times.  According to patient he is trying to stop and get his life back together.  Patient denies smoking, any illicit drug use.  At this time patient does not seem to be influenced by external or internal stimuli.  PHQ9 was completed,  pt score was a 3 (minimal depression).   Recommend inpatient FBC.  PHQ 2-9:  Flowsheet Row ED from 09/29/2022 in [facility] Office Visit from 09/14/2020 in [facility] Primary Care at [facility]  Thoughts that you would be better off dead, or of hurting yourself in some way Not at all Not at all  PHQ-9 Total Score 4 0       Flowsheet Row ED from 12/11/2022 in [facility] Most recent reading at 12/11/2022  4:57 AM ED from 12/10/2022 in [facility] Emergency Department at [facility] Most recent reading at 12/10/2022  6:38 PM ED from 12/10/2022 in [facility] Emergency Department at [facility] Most recent reading at 12/10/2022  3:52 PM  C-SSRS RISK CATEGORY No Risk No Risk No Risk       Screenings    Flowsheet Row Most Recent Value  CIWA-Ar Total 0       Total Time spent with patient: 20 minutes  Musculoskeletal  Strength & Muscle Tone: within normal limits Gait & Station: normal Patient leans: N/A  Psychiatric Specialty Exam  Presentation General Appearance:  Casual  Eye Contact: Good  Speech: Clear and Coherent  Speech Volume: Normal  Handedness: Right   Mood and Affect   Mood: Anxious  Affect: Appropriate   Thought Process  Thought Processes: Coherent  Descriptions of Associations:Circumstantial  Orientation:Full (Time, Place and Person)  Thought Content:WDL  Diagnosis of Schizophrenia or Schizoaffective disorder in past: No   Hallucinations:Hallucinations: None  Ideas of Reference:None  Suicidal Thoughts:Suicidal Thoughts: No  Homicidal Thoughts:Homicidal Thoughts: No   Sensorium  Memory: Immediate Fair  Judgment: Poor  Insight: Fair   Chartered certified accountant: Fair  Attention Span: Fair  Recall: [employer] of Knowledge: Fair  Language: Fair   Psychomotor Activity  Psychomotor Activity: Psychomotor Activity: Normal   Assets  Assets: Desire for Improvement; Resilience   Sleep  Sleep: Sleep: Good Number of Hours of Sleep: 8   Nutritional Assessment (For OBS and FBC admissions only) Has the patient had a weight loss or gain of 10 pounds or more in the last 3 months?: No Has the patient had a decrease in food intake/or appetite?: No Does the patient have dental problems?: No Does the patient have eating habits or behaviors that may be indicators of an eating disorder including binging or inducing vomiting?: No Has the patient recently lost weight without trying?: 0 Has the patient been eating poorly because of a decreased appetite?: 0 Malnutrition Screening Tool Score: 0    Physical Exam HENT:     Head: Normocephalic.     Nose: Nose normal.  Cardiovascular:     Rate and Rhythm: Normal rate.  Pulmonary:  Effort: Pulmonary effort is normal.  Musculoskeletal:        General: Normal range of motion.  Neurological:     General: No focal deficit present.     Mental Status: He is alert.  Psychiatric:        Mood and Affect: Mood normal.        Behavior: Behavior normal.    Review of Systems  Constitutional: Negative.   HENT: Negative.    Eyes: Negative.   Respiratory:  Negative.    Cardiovascular: Negative.   Gastrointestinal: Negative.   Genitourinary: Negative.   Musculoskeletal: Negative.   Skin: Negative.   Neurological: Negative.   Psychiatric/Behavioral:  Positive for substance abuse. The patient is nervous/anxious.     Blood pressure 100/60, pulse 75, temperature 97.9 F (36.6 C), temperature source Oral, resp. rate 18, SpO2 97%. There is no height or weight on file to calculate BMI.  Past Psychiatric History: Alcohol abuse with intoxication    Is the patient at risk to self? No  Has the patient been a risk to self in the past 6 months? No .    Has the patient been a risk to self within the distant past? No   Is the patient a risk to others? No   Has the patient been a risk to others in the past 6 months? No   Has the patient been a risk to others within the distant past? No   Past Medical History: see chart Family History: unknown  Social History: alcohol use  Last Labs:  Admission on 12/10/2022, Discharged on 12/11/2022  Component Date Value Ref Range Status   Sodium 12/10/2022 134 (L)  135 - 145 mmol/L Final   Potassium 12/10/2022 3.6  3.5 - 5.1 mmol/L Final   Chloride 12/10/2022 99  98 - 111 mmol/L Final   CO2 12/10/2022 25  22 - 32 mmol/L Final   Glucose, Bld 12/10/2022 177 (H)  70 - 99 mg/dL Final   Glucose reference range applies only to samples taken after fasting for at least 8 hours.   BUN 12/10/2022 13  6 - 20 mg/dL Final   Creatinine, Ser 12/10/2022 0.78  0.61 - 1.24 mg/dL Final   Calcium 16/02/9603 8.8 (L)  8.9 - 10.3 mg/dL Final   Total Protein 54/01/8118 6.7  6.5 - 8.1 g/dL Final   Albumin 14/78/2956 3.6  3.5 - 5.0 g/dL Final   AST 21/30/8657 33  15 - 41 U/L Final   ALT 12/10/2022 48 (H)  0 - 44 U/L Final   Alkaline Phosphatase 12/10/2022 81  38 - 126 U/L Final   Total Bilirubin 12/10/2022 0.7  0.3 - 1.2 mg/dL Final   GFR, Estimated 12/10/2022 >60  >60 mL/min Final   Comment: (NOTE) Calculated using the CKD-EPI  Creatinine Equation (2021)    Anion gap 12/10/2022 10  5 - 15 Final   Performed at [facility] Lab, [address], [place], [place] [address]   Alcohol, Ethyl (B) 12/10/2022 <10  <10 mg/dL Final   Comment: (NOTE) Lowest detectable limit for serum alcohol is 10 mg/dL.  For medical purposes only. Performed at [facility], [address] [place], [place] [address]    Opiates 12/10/2022 NONE DETECTED  NONE DETECTED Final   Cocaine 12/10/2022 NONE DETECTED  NONE DETECTED Final   Benzodiazepines 12/10/2022 POSITIVE (A)  NONE DETECTED Final   Amphetamines 12/10/2022 NONE DETECTED  NONE DETECTED Final   Tetrahydrocannabinol 12/10/2022 NONE DETECTED  NONE  DETECTED Final   Barbiturates 12/10/2022 POSITIVE (A)  NONE DETECTED Final   Comment: (NOTE) DRUG SCREEN FOR MEDICAL PURPOSES ONLY.  IF CONFIRMATION IS NEEDED FOR ANY PURPOSE, NOTIFY LAB WITHIN 5 DAYS.  LOWEST DETECTABLE LIMITS FOR URINE DRUG SCREEN Drug Class                     Cutoff (ng/mL) Amphetamine and metabolites    1000 Barbiturate and metabolites    200 Benzodiazepine                 200 Opiates and metabolites        300 Cocaine and metabolites        300 THC                            50 Performed at [facility] Lab, [address], [place], [place] [address]    WBC 12/10/2022 5.1  4.0 - 10.5 K/uL Final   RBC 12/10/2022 4.11 (L)  4.22 - 5.81 MIL/uL Final   Hemoglobin 12/10/2022 13.0  13.0 - 17.0 g/dL Final   HCT 19/14/7829 37.5 (L)  39.0 - 52.0 % Final   MCV 12/10/2022 91.2  80.0 - 100.0 fL Final   MCH 12/10/2022 31.6  26.0 - 34.0 pg Final   MCHC 12/10/2022 34.7  30.0 - 36.0 g/dL Final   RDW 56/21/3086 13.5  11.5 - 15.5 % Final   Platelets 12/10/2022 249  150 - 400 K/uL Final   nRBC 12/10/2022 0.0  0.0 - 0.2 % Final   Neutrophils Relative % 12/10/2022 50  % Final   Neutro Abs 12/10/2022 2.6  1.7 - 7.7 K/uL Final   Lymphocytes Relative 12/10/2022 31  % Final   Lymphs Abs 12/10/2022 1.6  0.7 - 4.0  K/uL Final   Monocytes Relative 12/10/2022 13  % Final   Monocytes Absolute 12/10/2022 0.7  0.1 - 1.0 K/uL Final   Eosinophils Relative 12/10/2022 5  % Final   Eosinophils Absolute 12/10/2022 0.2  0.0 - 0.5 K/uL Final   Basophils Relative 12/10/2022 1  % Final   Basophils Absolute 12/10/2022 0.1  0.0 - 0.1 K/uL Final   Immature Granulocytes 12/10/2022 0  % Final   Abs Immature Granulocytes 12/10/2022 0.01  0.00 - 0.07 K/uL Final   Performed at [facility], [address], [place], [place] [address]  Admission on 12/10/2022, Discharged on 12/10/2022  Component Date Value Ref Range Status   Sodium 12/10/2022 134 (L)  135 - 145 mmol/L Final   Potassium 12/10/2022 4.0  3.5 - 5.1 mmol/L Final   Chloride 12/10/2022 103  98 - 111 mmol/L Final   CO2 12/10/2022 20 (L)  22 - 32 mmol/L Final   Glucose, Bld 12/10/2022 97  70 - 99 mg/dL Final   Glucose reference range applies only to samples taken after fasting for at least 8 hours.   BUN 12/10/2022 10  6 - 20 mg/dL Final   Creatinine, Ser 12/10/2022 0.67  0.61 - 1.24 mg/dL Final   Calcium 96/29/5284 9.0  8.9 - 10.3 mg/dL Final   Total Protein 13/24/4010 7.4  6.5 - 8.1 g/dL Final   Albumin 27/25/3664 4.0  3.5 - 5.0 g/dL Final   AST 40/34/7425 42 (H)  15 - 41 U/L Final   ALT 12/10/2022 56 (H)  0 - 44 U/L Final   Alkaline Phosphatase 12/10/2022 77  38 - 126  U/L Final   Total Bilirubin 12/10/2022 0.7  0.3 - 1.2 mg/dL Final   GFR, Estimated 12/10/2022 >60  >60 mL/min Final   Comment: (NOTE) Calculated using the CKD-EPI Creatinine Equation (2021)    Anion gap 12/10/2022 11  5 - 15 Final   Performed at [facility] Lab, [address] [place], [place] [address]   Alcohol, Ethyl (B) 12/10/2022 <10  <10 mg/dL Final   Comment: (NOTE) Lowest detectable limit for serum alcohol is 10 mg/dL.  For medical purposes only. Performed at [facility], [address], [place], [place] [address]    WBC 12/10/2022 5.3  4.0 - 10.5 K/uL Final    RBC 12/10/2022 4.49  4.22 - 5.81 MIL/uL Final   Hemoglobin 12/10/2022 14.3  13.0 - 17.0 g/dL Final   HCT 03/47/4259 41.3  39.0 - 52.0 % Final   MCV 12/10/2022 92.0  80.0 - 100.0 fL Final   MCH 12/10/2022 31.8  26.0 - 34.0 pg Final   MCHC 12/10/2022 34.6  30.0 - 36.0 g/dL Final   RDW 56/38/7564 13.8  11.5 - 15.5 % Final   Platelets 12/10/2022 251  150 - 400 K/uL Final   nRBC 12/10/2022 0.0  0.0 - 0.2 % Final   Performed at [facility], [address], [place], [place] [address]   Opiates 12/10/2022 NONE DETECTED  NONE DETECTED Final   Cocaine 12/10/2022 NONE DETECTED  NONE DETECTED Final   Benzodiazepines 12/10/2022 POSITIVE (A)  NONE DETECTED Final   Amphetamines 12/10/2022 NONE DETECTED  NONE DETECTED Final   Tetrahydrocannabinol 12/10/2022 NONE DETECTED  NONE DETECTED Final   Barbiturates 12/10/2022 POSITIVE (A)  NONE DETECTED Final   Comment: (NOTE) DRUG SCREEN FOR MEDICAL PURPOSES ONLY.  IF CONFIRMATION IS NEEDED FOR ANY PURPOSE, NOTIFY LAB WITHIN 5 DAYS.  LOWEST DETECTABLE LIMITS FOR URINE DRUG SCREEN Drug Class                     Cutoff (ng/mL) Amphetamine and metabolites    1000 Barbiturate and metabolites    200 Benzodiazepine                 200 Opiates and metabolites        300 Cocaine and metabolites        300 THC                            50 Performed at [facility], [address] [place], [place] [address]   Admission on 12/08/2022, Discharged on 12/09/2022  Component Date Value Ref Range Status   WBC 12/08/2022 6.6  4.0 - 10.5 K/uL Final   RBC 12/08/2022 4.10 (L)  4.22 - 5.81 MIL/uL Final   Hemoglobin 12/08/2022 13.4  13.0 - 17.0 g/dL Final   HCT 66/10/3014 38.2 (L)  39.0 - 52.0 % Final   MCV 12/08/2022 93.2  80.0 - 100.0 fL Final   MCH 12/08/2022 32.7  26.0 - 34.0 pg Final   MCHC 12/08/2022 35.1  30.0 - 36.0 g/dL Final   RDW 05/29/3233 14.6  11.5 - 15.5 % Final   Platelets 12/08/2022 209  150 - 400 K/uL Final   nRBC 12/08/2022 0.0  0.0 -  0.2 % Final   Performed at [facility], [address] [place], [place] [address]   Troponin I (High Sensitivity) 12/08/2022 6  <18 ng/L Final   Comment: (  NOTE) Elevated high sensitivity troponin I (hsTnI) values and significant  changes across serial measurements may suggest ACS but many other  chronic and acute conditions are known to elevate hsTnI results.  Refer to the "Links" section for chest pain algorithms and additional  guidance. Performed at [facility], [address] [place], [place] [address]    Sodium 12/08/2022 140  135 - 145 mmol/L Final   Potassium 12/08/2022 3.2 (L)  3.5 - 5.1 mmol/L Final   Chloride 12/08/2022 108  98 - 111 mmol/L Final   CO2 12/08/2022 19 (L)  22 - 32 mmol/L Final   Glucose, Bld 12/08/2022 147 (H)  70 - 99 mg/dL Final   Glucose reference range applies only to samples taken after fasting for at least 8 hours.   BUN 12/08/2022 <5 (L)  6 - 20 mg/dL Final   Creatinine, Ser 12/08/2022 1.22  0.61 - 1.24 mg/dL Final   Calcium 60/45/4098 8.6 (L)  8.9 - 10.3 mg/dL Final   Total Protein 11/91/4782 7.2  6.5 - 8.1 g/dL Final   Albumin 95/62/1308 3.8  3.5 - 5.0 g/dL Final   AST 65/78/4696 40  15 - 41 U/L Final   ALT 12/08/2022 61 (H)  0 - 44 U/L Final   Alkaline Phosphatase 12/08/2022 75  38 - 126 U/L Final   Total Bilirubin 12/08/2022 0.5  0.3 - 1.2 mg/dL Final   GFR, Estimated 12/08/2022 >60  >60 mL/min Final   Comment: (NOTE) Calculated using the CKD-EPI Creatinine Equation (2021)    Anion gap 12/08/2022 13  5 - 15 Final   Performed at [facility], [address] [place], [place] [address]   Lipase 12/08/2022 28  11 - 51 U/L Final   Performed at [facility], [address] [place], [place] [address]   Alcohol, Ethyl (B) 12/08/2022 48 (H)  <10 mg/dL Final   Comment: (NOTE) Lowest detectable limit for serum alcohol is 10 mg/dL.  For medical purposes only. Performed at [facility], [address]  [place], [place] [address]    Troponin I (High Sensitivity) 12/08/2022 6  <18 ng/L Final   Comment: (NOTE) Elevated high sensitivity troponin I (hsTnI) values and significant  changes across serial measurements may suggest ACS but many other  chronic and acute conditions are known to elevate hsTnI results.  Refer to the "Links" section for chest pain algorithms and additional  guidance. Performed at [facility], [address], [place], [place] [address]    Total CK 12/08/2022 238  49 - 397 U/L Final   Performed at [facility], [address] [place], [place] [address]   Magnesium 12/08/2022 2.1  1.7 - 2.4 mg/dL Final   Performed at [facility], [address], [place], [place] [address]   Phosphorus 12/08/2022 3.1  2.5 - 4.6 mg/dL Final   Performed at [facility], [address] [place], [place] [address]   Prothrombin Time 12/08/2022 15.0  11.4 - 15.2 seconds Final   INR 12/08/2022 1.2  0.8 - 1.2 Final   Comment: (NOTE) INR goal varies based on device and disease states. Performed at [facility], [address], [place], [place] [address]    Prealbumin 12/09/2022 26  18 - 38 mg/dL Final   Performed at [facility], [address] [place], [place] [address]   Total Protein 12/08/2022 6.0 (L)  6.5 -  8.1 g/dL Final   Albumin 16/02/9603 3.1 (L)  3.5 - 5.0 g/dL Final   AST 54/01/8118 29  15 - 41 U/L Final   ALT 12/08/2022 49 (H)  0 - 44 U/L Final   Alkaline Phosphatase 12/08/2022 58  38 - 126 U/L Final   Total Bilirubin 12/08/2022 0.5  0.3 - 1.2 mg/dL Final   Bilirubin, Direct 12/08/2022 <0.1  0.0 - 0.2 mg/dL Final   Indirect Bilirubin 12/08/2022 NOT CALCULATED  0.3 - 0.9 mg/dL Final   Performed at [facility], [address] [place], [place] [address]   Ammonia 12/08/2022 16  9 - 35 umol/L Final   Performed at [facility], [address] [place], [place] [address]   TSH 12/08/2022 0.172 (L)  0.350 - 4.500 uIU/mL Final    Comment: Performed by a 3rd Generation assay with a functional sensitivity of <=0.01 uIU/mL. Performed at [facility], [address], [place], [place] [address]    Osmolality, Ur 12/08/2022 442  300 - 900 mOsm/kg Final   Performed at [facility], [address] [place], [place] [address]   Creatinine, Urine 12/08/2022 59  mg/dL Final   Performed at [facility], [address], [place], [place] [address]   Sodium, Ur 12/08/2022 123  mmol/L Final   Performed at [facility], [address], [place], [place] [address]   Sodium 12/08/2022 138  135 - 145 mmol/L Final   Potassium 12/08/2022 3.4 (L)  3.5 - 5.1 mmol/L Final   Chloride 12/08/2022 108  98 - 111 mmol/L Final   CO2 12/08/2022 23  22 - 32 mmol/L Final   Glucose, Bld 12/08/2022 125 (H)  70 - 99 mg/dL Final   Glucose reference range applies only to samples taken after fasting for at least 8 hours.   BUN 12/08/2022 <5 (L)  6 - 20 mg/dL Final   Creatinine, Ser 12/08/2022 1.13  0.61 - 1.24 mg/dL Final   Calcium 44/05/270 8.3 (L)  8.9 - 10.3 mg/dL Final   GFR, Estimated 12/08/2022 >60  >60 mL/min Final   Comment: (NOTE) Calculated using the CKD-EPI Creatinine Equation (2021)    Anion gap 12/08/2022 7  5 - 15 Final   Performed at [facility], [address] [place], [place] [address]   Osmolality 12/08/2022 320 (H)  275 - 295 mOsm/kg Final   Performed at [facility], [address], [place], [place] [address]   Magnesium 12/09/2022 2.1  1.7 - 2.4 mg/dL Final   Performed at [facility], [address] [place], [place] [address]   Phosphorus 12/09/2022 3.7  2.5 - 4.6 mg/dL Final   Performed at [facility], [address] [place], [place] [address]   Sodium 12/09/2022 138  135 - 145 mmol/L Final   Potassium 12/09/2022 3.3 (L)  3.5 - 5.1 mmol/L Final   Chloride 12/09/2022 108  98 - 111 mmol/L Final   CO2 12/09/2022 23  22 - 32 mmol/L Final   Glucose, Bld 12/09/2022 132 (H)  70 -  99 mg/dL Final   Glucose reference range applies only to samples taken after fasting for at least 8 hours.   BUN 12/09/2022 <5 (L)  6 - 20 mg/dL Final   Creatinine, Ser 12/09/2022 1.08  0.61 - 1.24 mg/dL Final   Calcium 64/33/2951 8.8 (L)  8.9 - 10.3 mg/dL Final   Total Protein  12/09/2022 6.5  6.5 - 8.1 g/dL Final   Albumin 16/02/9603 3.4 (L)  3.5 - 5.0 g/dL Final   AST 54/01/8118 31  15 - 41 U/L Final   ALT 12/09/2022 49 (H)  0 - 44 U/L Final   Alkaline Phosphatase 12/09/2022 66  38 - 126 U/L Final   Total Bilirubin 12/09/2022 0.5  0.3 - 1.2 mg/dL Final   GFR, Estimated 12/09/2022 >60  >60 mL/min Final   Comment: (NOTE) Calculated using the CKD-EPI Creatinine Equation (2021)    Anion gap 12/09/2022 7  5 - 15 Final   Performed at [facility] Lab, [address] [place], [place] [address]   WBC 12/09/2022 5.3  4.0 - 10.5 K/uL Final   RBC 12/09/2022 3.69 (L)  4.22 - 5.81 MIL/uL Final   Hemoglobin 12/09/2022 12.1 (L)  13.0 - 17.0 g/dL Final   HCT 95/62/1308 34.6 (L)  39.0 - 52.0 % Final   MCV 12/09/2022 93.8  80.0 - 100.0 fL Final   MCH 12/09/2022 32.8  26.0 - 34.0 pg Final   MCHC 12/09/2022 35.0  30.0 - 36.0 g/dL Final   RDW 65/78/4696 14.6  11.5 - 15.5 % Final   Platelets 12/09/2022 194  150 - 400 K/uL Final   nRBC 12/09/2022 0.0  0.0 - 0.2 % Final   Performed at [facility], [address], [place], [place] [address]   Weight 12/09/2022 2,688  oz Final   Height 12/09/2022 68  in Final   BP 12/09/2022 133/95  mmHg Final   S' Lateral 12/09/2022 3.00  cm Final   AR max vel 12/09/2022 2.41  cm2 Final   AV Area VTI 12/09/2022 2.28  cm2 Final   AV Mean grad 12/09/2022 4.0  mmHg Final   AV Peak grad 12/09/2022 5.5  mmHg Final   Ao pk vel 12/09/2022 1.17  m/s Final   Area-P 1/2 12/09/2022 3.42  cm2 Final   AV Area mean vel 12/09/2022 2.22  cm2 Final   Est EF 12/09/2022 60 - 65%   Final   Beta-Hydroxybutyric Acid 12/08/2022 0.10  0.05 - 0.27 mmol/L Final   Performed at  [facility], [address], [place], [place] [address]   Glucose-Capillary 12/08/2022 120 (H)  70 - 99 mg/dL Final   Glucose reference range applies only to samples taken after fasting for at least 8 hours.   Cholesterol 12/09/2022 183  0 - 200 mg/dL Final   Triglycerides 40/02/2724 91  <150 mg/dL Final   HDL 36/64/4034 50  >40 mg/dL Final   Total CHOL/HDL Ratio 12/09/2022 3.7  RATIO Final   VLDL 12/09/2022 18  0 - 40 mg/dL Final   LDL Cholesterol 12/09/2022 115 (H)  0 - 99 mg/dL Final   Comment:        Total Cholesterol/HDL:CHD Risk Coronary Heart Disease Risk Table                     Men   Women  1/2 Average Risk   3.4   3.3  Average Risk       5.0   4.4  2 X Average Risk   9.6   7.1  3 X Average Risk  23.4   11.0        Use the calculated Patient Ratio above and the CHD Risk Table to determine the patient's CHD Risk.        ATP III CLASSIFICATION (LDL):  <100  mg/dL   Optimal  161-096  mg/dL   Near or Above                    Optimal  130-159  mg/dL   Borderline  045-409  mg/dL   High  >811     mg/dL   Very High Performed at [facility], [address] [place], [place] [address]    Free T4 12/09/2022 0.78  0.61 - 1.12 ng/dL Final   Comment: (NOTE) Biotin ingestion may interfere with free T4 tests. If the results are inconsistent with the TSH level, previous test results, or the clinical presentation, then consider biotin interference. If needed, order repeat testing after stopping biotin. Performed at [facility], [address] [place], [place] [address]    D-Dimer, Quant 12/09/2022 0.43  0.00 - 0.50 ug/mL-FEU Final   Comment: (NOTE) At the manufacturer cut-off value of 0.5 g/mL FEU, this assay has a negative predictive value of 95-100%.This assay is intended for use in conjunction with a clinical pretest probability (PTP) assessment model to exclude pulmonary embolism (PE) and deep venous thrombosis (DVT) in outpatients suspected of PE  or DVT. Results should be correlated with clinical presentation. Performed at [facility], [address] [place], [place] [address]    T3, Total 12/09/2022 103  71 - 180 ng/dL Final   Comment: (NOTE) Performed At: [facility] [address] [place], [place] [id number] Jolene Schimke MD [id number]    Glucose-Capillary 12/08/2022 108 (H)  70 - 99 mg/dL Final   Glucose reference range applies only to samples taken after fasting for at least 8 hours.   Glucose-Capillary 12/09/2022 122 (H)  70 - 99 mg/dL Final   Glucose reference range applies only to samples taken after fasting for at least 8 hours.   Glucose-Capillary 12/09/2022 115 (H)  70 - 99 mg/dL Final   Glucose reference range applies only to samples taken after fasting for at least 8 hours.   Glucose-Capillary 12/09/2022 148 (H)  70 - 99 mg/dL Final   Glucose reference range applies only to samples taken after fasting for at least 8 hours.   Glucose-Capillary 12/09/2022 109 (H)  70 - 99 mg/dL Final   Glucose reference range applies only to samples taken after fasting for at least 8 hours.  Admission on 12/07/2022, Discharged on 12/08/2022  Component Date Value Ref Range Status   Sodium 12/07/2022 141  135 - 145 mmol/L Final   Potassium 12/07/2022 3.4 (L)  3.5 - 5.1 mmol/L Final   Chloride 12/07/2022 103  98 - 111 mmol/L Final   CO2 12/07/2022 21 (L)  22 - 32 mmol/L Final   Glucose, Bld 12/07/2022 242 (H)  70 - 99 mg/dL Final   Glucose reference range applies only to samples taken after fasting for at least 8 hours.   BUN 12/07/2022 8  6 - 20 mg/dL Final   Creatinine, Ser 12/07/2022 1.80 (H)  0.61 - 1.24 mg/dL Final   DELTA CHECK NOTED   Calcium 12/07/2022 9.1  8.9 - 10.3 mg/dL Final   Total Protein 27/25/3664 7.7  6.5 - 8.1 g/dL Final   Albumin 40/34/7425 4.1  3.5 - 5.0 g/dL Final   AST 95/63/8756 60 (H)  15 - 41 U/L Final   ALT 12/07/2022 81 (H)  0 - 44 U/L Final   Alkaline Phosphatase 12/07/2022 81  38  - 126 U/L Final   Total Bilirubin 12/07/2022 0.6  0.3 - 1.2 mg/dL Final   GFR, Estimated 12/07/2022 43 (L)  >60 mL/min Final   Comment: (NOTE) Calculated using the CKD-EPI Creatinine Equation (2021)    Anion gap 12/07/2022 17 (H)  5 - 15 Final   Performed at [facility], [address] [place], [place] [address]   Alcohol, Ethyl (B) 12/07/2022 <10  <10 mg/dL Final   Comment: (NOTE) Lowest detectable limit for serum alcohol is 10 mg/dL.  For medical purposes only. Performed at [facility], [address], [place], [place] [address]    Salicylate Lvl 12/07/2022 <7.0 (L)  7.0 - 30.0 mg/dL Final   Performed at [facility], [address], [place], [place] [address]   Acetaminophen (Tylenol), Serum 12/07/2022 <10 (L)  10 - 30 ug/mL Final   Comment: (NOTE) Therapeutic concentrations vary significantly. A range of 10-30 ug/mL  may be an effective concentration for many patients. However, some  are best treated at concentrations outside of this range. Acetaminophen concentrations >150 ug/mL at 4 hours after ingestion  and >50 ug/mL at 12 hours after ingestion are often associated with  toxic reactions.  Performed at [facility], [address], [place], [place] [address]    WBC 12/07/2022 9.7  4.0 - 10.5 K/uL Final   RBC 12/07/2022 4.75  4.22 - 5.81 MIL/uL Final   Hemoglobin 12/07/2022 15.3  13.0 - 17.0 g/dL Final   HCT 29/56/2130 43.0  39.0 - 52.0 % Final   MCV 12/07/2022 90.5  80.0 - 100.0 fL Final   MCH 12/07/2022 32.2  26.0 - 34.0 pg Final   MCHC 12/07/2022 35.6  30.0 - 36.0 g/dL Final   RDW 86/57/8469 14.6  11.5 - 15.5 % Final   Platelets 12/07/2022 213  150 - 400 K/uL Final   nRBC 12/07/2022 0.0  0.0 - 0.2 % Final   Performed at [facility], [address], [place], [place] [address]   Opiates 12/08/2022 NONE DETECTED  NONE DETECTED Final   Cocaine 12/08/2022 NONE DETECTED  NONE DETECTED Final   Benzodiazepines 12/08/2022 POSITIVE (A)  NONE  DETECTED Final   Amphetamines 12/08/2022 NONE DETECTED  NONE DETECTED Final   Tetrahydrocannabinol 12/08/2022 NONE DETECTED  NONE DETECTED Final   Barbiturates 12/08/2022 NONE DETECTED  NONE DETECTED Final   Comment: (NOTE) DRUG SCREEN FOR MEDICAL PURPOSES ONLY.  IF CONFIRMATION IS NEEDED FOR ANY PURPOSE, NOTIFY LAB WITHIN 5 DAYS.  LOWEST DETECTABLE LIMITS FOR URINE DRUG SCREEN Drug Class                     Cutoff (ng/mL) Amphetamine and metabolites    1000 Barbiturate and metabolites    200 Benzodiazepine                 200 Opiates and metabolites        300 Cocaine and metabolites        300 THC                            50 Performed at [facility], [address] [place], [place] [address]    Beta-Hydroxybutyric Acid 12/07/2022 0.10  0.05 - 0.27 mmol/L Final   Performed at [facility], [address] [place], [place] [address]   Lactic Acid, Venous 12/07/2022 2.4 (HH)  0.5 - 1.9 mmol/L Final   Comment: CRITICAL RESULT CALLED TO,  READ BACK BY AND VERIFIED WITH Birdie Hopes RN @ [phone] 12/07/22 JBUTLER Performed at [facility], [address], [place], [place] [address]    Lactic Acid, Venous 12/08/2022 2.0 (HH)  0.5 - 1.9 mmol/L Final   Comment: CRITICAL VALUE NOTED. VALUE IS CONSISTENT WITH PREVIOUSLY REPORTED/CALLED VALUE Performed at [facility], [address], [place], [place] [address]    Magnesium 12/07/2022 2.5 (H)  1.7 - 2.4 mg/dL Final   Comment: HEMOLYSIS AT THIS LEVEL MAY AFFECT RESULT Performed at [facility], [address], [place], [place] [address]    Sodium 12/08/2022 142  135 - 145 mmol/L Final   Potassium 12/08/2022 3.2 (L)  3.5 - 5.1 mmol/L Final   Chloride 12/08/2022 110  98 - 111 mmol/L Final   CO2 12/08/2022 19 (L)  22 - 32 mmol/L Final   Glucose, Bld 12/08/2022 158 (H)  70 - 99 mg/dL Final   Glucose reference range applies only to samples taken after fasting for at least 8 hours.   BUN 12/08/2022 8  6 - 20 mg/dL  Final   Creatinine, Ser 12/08/2022 1.70 (H)  0.61 - 1.24 mg/dL Final   Calcium 86/57/8469 7.9 (L)  8.9 - 10.3 mg/dL Final   GFR, Estimated 12/08/2022 46 (L)  >60 mL/min Final   Comment: (NOTE) Calculated using the CKD-EPI Creatinine Equation (2021)    Anion gap 12/08/2022 13  5 - 15 Final   Performed at [facility], [address], [place], [place] [address]   Osmolality 12/08/2022 345 (HH)  275 - 295 mOsm/kg Final   Comment: REPEATED TO VERIFY CRITICAL RESULT CALLED TO, READ BACK BY AND VERIFIED WITH: GASQUE,S RN 12/08/2022 AT 0238 SKEEN,P Performed at [facility], [address], [place], [place] [address]   No results displayed because visit has over 200 results.    Admission on 09/29/2022, Discharged on 10/01/2022  Component Date Value Ref Range Status   Glucose-Capillary 09/30/2022 238 (H)  70 - 99 mg/dL Final   Glucose reference range applies only to samples taken after fasting for at least 8 hours.   Glucose-Capillary 09/30/2022 159 (H)  70 - 99 mg/dL Final   Glucose reference range applies only to samples taken after fasting for at least 8 hours.   Glucose-Capillary 09/30/2022 191 (H)  70 - 99 mg/dL Final   Glucose reference range applies only to samples taken after fasting for at least 8 hours.   Glucose-Capillary 09/30/2022 275 (H)  70 - 99 mg/dL Final   Glucose reference range applies only to samples taken after fasting for at least 8 hours.   Glucose-Capillary 09/30/2022 247 (H)  70 - 99 mg/dL Final   Glucose reference range applies only to samples taken after fasting for at least 8 hours.   Glucose-Capillary 09/30/2022 236 (H)  70 - 99 mg/dL Final   Glucose reference range applies only to samples taken after fasting for at least 8 hours.   Glucose-Capillary 10/01/2022 156 (H)  70 - 99 mg/dL Final   Glucose reference range applies only to samples taken after fasting for at least 8 hours.   Glucose-Capillary 10/01/2022 186 (H)  70 - 99 mg/dL Final   Glucose  reference range applies only to samples taken after fasting for at least 8 hours.   Glucose-Capillary 10/01/2022 139 (H)  70 - 99 mg/dL Final   Glucose reference range applies only to samples taken after fasting for at least  8 hours.  Admission on 09/29/2022, Discharged on 09/29/2022  Component Date Value Ref Range Status   WBC 09/29/2022 5.7  4.0 - 10.5 K/uL Final   RBC 09/29/2022 4.72  4.22 - 5.81 MIL/uL Final   Hemoglobin 09/29/2022 15.1  13.0 - 17.0 g/dL Final   HCT 16/02/9603 43.1  39.0 - 52.0 % Final   MCV 09/29/2022 91.3  80.0 - 100.0 fL Final   MCH 09/29/2022 32.0  26.0 - 34.0 pg Final   MCHC 09/29/2022 35.0  30.0 - 36.0 g/dL Final   RDW 54/01/8118 13.3  11.5 - 15.5 % Final   Platelets 09/29/2022 157  150 - 400 K/uL Final   nRBC 09/29/2022 0.0  0.0 - 0.2 % Final   Performed at [facility], [address], [place], [place] [address]   Neutrophils Relative % 09/29/2022 71  % Final   Neutro Abs 09/29/2022 4.1  1.7 - 7.7 K/uL Final   Lymphocytes Relative 09/29/2022 15  % Final   Lymphs Abs 09/29/2022 0.8  0.7 - 4.0 K/uL Final   Monocytes Relative 09/29/2022 12  % Final   Monocytes Absolute 09/29/2022 0.7  0.1 - 1.0 K/uL Final   Eosinophils Relative 09/29/2022 1  % Final   Eosinophils Absolute 09/29/2022 0.1  0.0 - 0.5 K/uL Final   Basophils Relative 09/29/2022 1  % Final   Basophils Absolute 09/29/2022 0.0  0.0 - 0.1 K/uL Final   Immature Granulocytes 09/29/2022 0  % Final   Abs Immature Granulocytes 09/29/2022 0.02  0.00 - 0.07 K/uL Final   Performed at [facility], [address] [place], [place] [address]   Sodium 09/29/2022 133 (L)  135 - 145 mmol/L Final   Potassium 09/29/2022 3.7  3.5 - 5.1 mmol/L Final   Chloride 09/29/2022 101  98 - 111 mmol/L Final   CO2 09/29/2022 23  22 - 32 mmol/L Final   Glucose, Bld 09/29/2022 206 (H)  70 - 99 mg/dL Final   Glucose reference range applies only to samples taken after fasting for at least 8 hours.   BUN 09/29/2022 11   6 - 20 mg/dL Final   Creatinine, Ser 09/29/2022 0.65  0.61 - 1.24 mg/dL Final   Calcium 30/86/5784 9.2  8.9 - 10.3 mg/dL Final   Total Protein 69/62/9528 7.0  6.5 - 8.1 g/dL Final   Albumin 41/32/4401 3.7  3.5 - 5.0 g/dL Final   AST 02/72/5366 126 (H)  15 - 41 U/L Final   ALT 09/29/2022 161 (H)  0 - 44 U/L Final   Alkaline Phosphatase 09/29/2022 115  38 - 126 U/L Final   Total Bilirubin 09/29/2022 0.7  0.3 - 1.2 mg/dL Final   GFR, Estimated 09/29/2022 >60  >60 mL/min Final   Comment: (NOTE) Calculated using the CKD-EPI Creatinine Equation (2021)    Anion gap 09/29/2022 9  5 - 15 Final   Performed at [facility], [address] [place], [place] [address]   Alcohol, Ethyl (B) 09/29/2022 <10  <10 mg/dL Final   Comment: (NOTE) Lowest detectable limit for serum alcohol is 10 mg/dL.  For medical purposes only. Performed at [facility], [address], [place], [place] [address]    Magnesium 09/29/2022 2.2  1.7 - 2.4 mg/dL Final   Performed at [facility], [address], [place], [place] [address]   Troponin I (High Sensitivity) 09/29/2022 6  <18 ng/L Final   Comment: (NOTE) Elevated high  sensitivity troponin I (hsTnI) values and significant  changes across serial measurements may suggest ACS but many other  chronic and acute conditions are known to elevate hsTnI results.  Refer to the "Links" section for chest pain algorithms and additional  guidance. Performed at [facility], [address], [place], [place] [address]    Troponin I (High Sensitivity) 09/29/2022 5  <18 ng/L Final   Comment: (NOTE) Elevated high sensitivity troponin I (hsTnI) values and significant  changes across serial measurements may suggest ACS but many other  chronic and acute conditions are known to elevate hsTnI results.  Refer to the "Links" section for chest pain algorithms and additional  guidance. Performed at [facility], [address], [place],  [place] [address]    Glucose-Capillary 09/29/2022 225 (H)  70 - 99 mg/dL Final   Glucose reference range applies only to samples taken after fasting for at least 8 hours.  Admission on 09/27/2022, Discharged on 09/29/2022  Component Date Value Ref Range Status   Glucose-Capillary 09/27/2022 271 (H)  70 - 99 mg/dL Final   Glucose reference range applies only to samples taken after fasting for at least 8 hours.   Glucose-Capillary 09/27/2022 252 (H)  70 - 99 mg/dL Final   Glucose reference range applies only to samples taken after fasting for at least 8 hours.   Glucose-Capillary 09/27/2022 284 (H)  70 - 99 mg/dL Final   Glucose reference range applies only to samples taken after fasting for at least 8 hours.   Glucose-Capillary 09/27/2022 226 (H)  70 - 99 mg/dL Final   Glucose reference range applies only to samples taken after fasting for at least 8 hours.   Glucose-Capillary 09/27/2022 342 (H)  70 - 99 mg/dL Final   Glucose reference range applies only to samples taken after fasting for at least 8 hours.   Glucose-Capillary 09/28/2022 255 (H)  70 - 99 mg/dL Final   Glucose reference range applies only to samples taken after fasting for at least 8 hours.   Glucose-Capillary 09/28/2022 324 (H)  70 - 99 mg/dL Final   Glucose reference range applies only to samples taken after fasting for at least 8 hours.   Glucose-Capillary 09/28/2022 309 (H)  70 - 99 mg/dL Final   Glucose reference range applies only to samples taken after fasting for at least 8 hours.   Glucose-Capillary 09/28/2022 311 (H)  70 - 99 mg/dL Final   Glucose reference range applies only to samples taken after fasting for at least 8 hours.   Glucose-Capillary 09/29/2022 305 (H)  70 - 99 mg/dL Final   Glucose reference range applies only to samples taken after fasting for at least 8 hours.  Admission on 09/26/2022, Discharged on 09/27/2022  Component Date Value Ref Range Status   WBC 09/26/2022 8.1  4.0 - 10.5 K/uL Final   RBC  09/26/2022 4.88  4.22 - 5.81 MIL/uL Final   Hemoglobin 09/26/2022 15.9  13.0 - 17.0 g/dL Final   HCT 09/81/1914 44.4  39.0 - 52.0 % Final   MCV 09/26/2022 91.0  80.0 - 100.0 fL Final   MCH 09/26/2022 32.6  26.0 - 34.0 pg Final   MCHC 09/26/2022 35.8  30.0 - 36.0 g/dL Final   RDW 78/29/5621 13.5  11.5 - 15.5 % Final   Platelets 09/26/2022 150  150 - 400 K/uL Final   nRBC 09/26/2022 0.0  0.0 - 0.2 % Final   Performed at [facility], [address] [place], [place] [address]   Sodium 09/26/2022 131 (L)  135 - 145 mmol/L Final   Potassium 09/26/2022 4.2  3.5 - 5.1 mmol/L Final   Chloride 09/26/2022 98  98 - 111 mmol/L Final   CO2 09/26/2022 18 (L)  22 - 32 mmol/L Final   Glucose, Bld 09/26/2022 296 (H)  70 - 99 mg/dL Final   Glucose reference range applies only to samples taken after fasting for at least 8 hours.   BUN 09/26/2022 18  6 - 20 mg/dL Final   Creatinine, Ser 09/26/2022 0.88  0.61 - 1.24 mg/dL Final   Calcium 91/47/8295 9.9  8.9 - 10.3 mg/dL Final   Total Protein 62/13/0865 7.1  6.5 - 8.1 g/dL Final   Albumin 78/46/9629 3.8  3.5 - 5.0 g/dL Final   AST 52/84/1324 112 (H)  15 - 41 U/L Final   ALT 09/26/2022 172 (H)  0 - 44 U/L Final   Alkaline Phosphatase 09/26/2022 162 (H)  38 - 126 U/L Final   Total Bilirubin 09/26/2022 0.6  0.3 - 1.2 mg/dL Final   GFR, Estimated 09/26/2022 >60  >60 mL/min Final   Comment: (NOTE) Calculated using the CKD-EPI Creatinine Equation (2021)    Anion gap 09/26/2022 15  5 - 15 Final   Performed at [facility], [address], [place], [place] [address]   Glucose-Capillary 09/26/2022 281 (H)  70 - 99 mg/dL Final   Glucose reference range applies only to samples taken after fasting for at least 8 hours.   Alcohol, Ethyl (B) 09/26/2022 <10  <10 mg/dL Final   Comment: (NOTE) Lowest detectable limit for serum alcohol is 10 mg/dL.  For medical purposes only. Performed at [facility], [address], [place], [place] [address]     Color, Urine 09/26/2022 YELLOW  YELLOW Final   APPearance 09/26/2022 CLEAR  CLEAR Final   Specific Gravity, Urine 09/26/2022 1.023  1.005 - 1.030 Final   pH 09/26/2022 5.0  5.0 - 8.0 Final   Glucose, UA 09/26/2022 >=500 (A)  NEGATIVE mg/dL Final   Hgb urine dipstick 09/26/2022 NEGATIVE  NEGATIVE Final   Bilirubin Urine 09/26/2022 NEGATIVE  NEGATIVE Final   Ketones, ur 09/26/2022 NEGATIVE  NEGATIVE mg/dL Final   Protein, ur 64/40/3474 NEGATIVE  NEGATIVE mg/dL Final   Nitrite 25/95/6387 NEGATIVE  NEGATIVE Final   Leukocytes,Ua 09/26/2022 NEGATIVE  NEGATIVE Final   RBC / HPF 09/26/2022 0-5  0 - 5 RBC/hpf Final   WBC, UA 09/26/2022 0-5  0 - 5 WBC/hpf Final   Bacteria, UA 09/26/2022 RARE (A)  NONE SEEN Final   Squamous Epithelial / HPF 09/26/2022 0-5  0 - 5 /HPF Final   Performed at [facility], [address] [place], [place] [address]   Opiates 09/26/2022 NONE DETECTED  NONE DETECTED Final   Cocaine 09/26/2022 NONE DETECTED  NONE DETECTED Final   Benzodiazepines 09/26/2022 POSITIVE (A)  NONE DETECTED Final   Amphetamines 09/26/2022 NONE DETECTED  NONE DETECTED Final   Tetrahydrocannabinol 09/26/2022 NONE DETECTED  NONE DETECTED Final   Barbiturates 09/26/2022 NONE DETECTED  NONE DETECTED Final   Comment: (NOTE) DRUG SCREEN FOR MEDICAL PURPOSES ONLY.  IF CONFIRMATION IS NEEDED FOR ANY PURPOSE, NOTIFY LAB WITHIN 5 DAYS.  LOWEST DETECTABLE LIMITS FOR URINE DRUG SCREEN Drug Class                     Cutoff (ng/mL) Amphetamine and metabolites  1000 Barbiturate and metabolites    200 Benzodiazepine                 200 Opiates and metabolites        300 Cocaine and metabolites        300 THC                            50 Performed at [facility], [address] [place], [place] [address]    pH, Ven 09/26/2022 7.437 (H)  7.25 - 7.43 Final   pCO2, Ven 09/26/2022 31.6 (L)  44 - 60 mmHg Final   pO2, Ven 09/26/2022 87 (H)  32 - 45 mmHg Final   Bicarbonate 09/26/2022  21.3  20.0 - 28.0 mmol/L Final   TCO2 09/26/2022 22  22 - 32 mmol/L Final   O2 Saturation 09/26/2022 97  % Final   Acid-base deficit 09/26/2022 2.0  0.0 - 2.0 mmol/L Final   Sodium 09/26/2022 133 (L)  135 - 145 mmol/L Final   Potassium 09/26/2022 3.6  3.5 - 5.1 mmol/L Final   Calcium, Ion 09/26/2022 1.19  1.15 - 1.40 mmol/L Final   HCT 09/26/2022 42.0  39.0 - 52.0 % Final   Hemoglobin 09/26/2022 14.3  13.0 - 17.0 g/dL Final   Sample type 84/69/6295 VENOUS   Final   Glucose-Capillary 09/26/2022 294 (H)  70 - 99 mg/dL Final   Glucose reference range applies only to samples taken after fasting for at least 8 hours.  Admission on 09/25/2022, Discharged on 09/26/2022  Component Date Value Ref Range Status   WBC 09/25/2022 5.2  4.0 - 10.5 K/uL Final   RBC 09/25/2022 5.20  4.22 - 5.81 MIL/uL Final   Hemoglobin 09/25/2022 16.9  13.0 - 17.0 g/dL Final   HCT 28/41/3244 46.0  39.0 - 52.0 % Final   MCV 09/25/2022 88.5  80.0 - 100.0 fL Final   MCH 09/25/2022 32.5  26.0 - 34.0 pg Final   MCHC 09/25/2022 36.7 (H)  30.0 - 36.0 g/dL Final   RDW 05/23/7251 13.2  11.5 - 15.5 % Final   Platelets 09/25/2022 146 (L)  150 - 400 K/uL Final   nRBC 09/25/2022 0.0  0.0 - 0.2 % Final   Neutrophils Relative % 09/25/2022 55  % Final   Neutro Abs 09/25/2022 2.8  1.7 - 7.7 K/uL Final   Lymphocytes Relative 09/25/2022 32  % Final   Lymphs Abs 09/25/2022 1.7  0.7 - 4.0 K/uL Final   Monocytes Relative 09/25/2022 11  % Final   Monocytes Absolute 09/25/2022 0.6  0.1 - 1.0 K/uL Final   Eosinophils Relative 09/25/2022 1  % Final   Eosinophils Absolute 09/25/2022 0.1  0.0 - 0.5 K/uL Final   Basophils Relative 09/25/2022 1  % Final   Basophils Absolute 09/25/2022 0.0  0.0 - 0.1 K/uL Final   Immature Granulocytes 09/25/2022 0  % Final   Abs Immature Granulocytes 09/25/2022 0.02  0.00 - 0.07 K/uL Final   Performed at [facility], [address], [place], [place] [address]   Sodium 09/25/2022 133 (L)  135 - 145  mmol/L Final   Potassium 09/25/2022 3.5  3.5 - 5.1 mmol/L Final   Chloride 09/25/2022 100  98 - 111 mmol/L Final   CO2 09/25/2022 17 (L)  22 - 32 mmol/L Final   Glucose, Bld 09/25/2022 251 (H)  70 - 99 mg/dL Final   Glucose  reference range applies only to samples taken after fasting for at least 8 hours.   BUN 09/25/2022 <5 (L)  6 - 20 mg/dL Final   Creatinine, Ser 09/25/2022 0.64  0.61 - 1.24 mg/dL Final   Calcium 66/44/0347 9.7  8.9 - 10.3 mg/dL Final   Total Protein 42/59/5638 7.4  6.5 - 8.1 g/dL Final   Albumin 75/64/3329 4.2  3.5 - 5.0 g/dL Final   AST 51/88/4166 111 (H)  15 - 41 U/L Final   ALT 09/25/2022 172 (H)  0 - 44 U/L Final   Alkaline Phosphatase 09/25/2022 132 (H)  38 - 126 U/L Final   Total Bilirubin 09/25/2022 0.5  0.3 - 1.2 mg/dL Final   GFR, Estimated 09/25/2022 >60  >60 mL/min Final   Comment: (NOTE) Calculated using the CKD-EPI Creatinine Equation (2021)    Anion gap 09/25/2022 16 (H)  5 - 15 Final   Performed at [facility], [address], [place], [place] [address]   Hgb A1c MFr Bld 09/25/2022 10.8 (H)  4.8 - 5.6 % Final   Comment: (NOTE) Pre diabetes:          5.7%-6.4%  Diabetes:              >6.4%  Glycemic control for   <7.0% adults with diabetes    Mean Plasma Glucose 09/25/2022 263.26  mg/dL Final   Performed at [facility], [address] [place], [place] [address]   Alcohol, Ethyl (B) 09/25/2022 290 (H)  <10 mg/dL Final   Comment: (NOTE) Lowest detectable limit for serum alcohol is 10 mg/dL.  For medical purposes only. Performed at [facility], [name], [place], [place] [address]    POC Amphetamine UR 09/26/2022 None Detected  NONE DETECTED (Cut Off Level 1000 ng/mL) Final   POC Secobarbital (BAR) 09/26/2022 None Detected  NONE DETECTED (Cut Off Level 300 ng/mL) Final   POC Buprenorphine (BUP) 09/26/2022 None Detected  NONE DETECTED (Cut Off Level 10 ng/mL) Final   POC Oxazepam (BZO) 09/26/2022 Positive (A)  NONE  DETECTED (Cut Off Level 300 ng/mL) Final   POC Cocaine UR 09/26/2022 None Detected  NONE DETECTED (Cut Off Level 300 ng/mL) Final   POC Methamphetamine UR 09/26/2022 None Detected  NONE DETECTED (Cut Off Level 1000 ng/mL) Final   POC Morphine 09/26/2022 None Detected  NONE DETECTED (Cut Off Level 300 ng/mL) Final   POC Methadone UR 09/26/2022 None Detected  NONE DETECTED (Cut Off Level 300 ng/mL) Final   POC Oxycodone UR 09/26/2022 None Detected  NONE DETECTED (Cut Off Level 100 ng/mL) Final   POC Marijuana UR 09/26/2022 None Detected  NONE DETECTED (Cut Off Level 50 ng/mL) Final   Cholesterol 09/25/2022 192  0 - 200 mg/dL Final   Triglycerides 73/22/0254 169 (H)  <150 mg/dL Final   HDL 27/10/2374 98  >40 mg/dL Final   Total CHOL/HDL Ratio 09/25/2022 2.0  RATIO Final   VLDL 09/25/2022 34  0 - 40 mg/dL Final   LDL Cholesterol 09/25/2022 60  0 - 99 mg/dL Final   Comment:        Total Cholesterol/HDL:CHD Risk Coronary Heart Disease Risk Table                     Men   Women  1/2 Average Risk   3.4   3.3  Average Risk       5.0   4.4  2 X Average Risk  9.6   7.1  3 X Average Risk  23.4   11.0        Use the calculated Patient Ratio above and the CHD Risk Table to determine the patient's CHD Risk.        ATP III CLASSIFICATION (LDL):  <100     mg/dL   Optimal  829-562  mg/dL   Near or Above                    Optimal  130-159  mg/dL   Borderline  130-865  mg/dL   High  >784     mg/dL   Very High Performed at [facility], [address], [place], [place] [address]    TSH 09/25/2022 1.526  0.350 - 4.500 uIU/mL Final   Comment: Performed by a 3rd Generation assay with a functional sensitivity of <=0.01 uIU/mL. Performed at [facility], [address], [place], [place] [address]    Prothrombin Time 09/26/2022 13.0  11.4 - 15.2 seconds Final   INR 09/26/2022 1.0  0.8 - 1.2 Final   Comment: (NOTE) INR goal varies based on device and disease states. Performed at [facility], [address], [place], [place] [address]    Hepatitis B Surface Ag 09/26/2022 NON REACTIVE  NON REACTIVE Final   HCV Ab 09/26/2022 NON REACTIVE  NON REACTIVE Final   Comment: (NOTE) Nonreactive HCV antibody screen is consistent with no HCV infections,  unless recent infection is suspected or other evidence exists to indicate HCV infection.     Hep A IgM 09/26/2022 NON REACTIVE  NON REACTIVE Final   Hep B C IgM 09/26/2022 NON REACTIVE  NON REACTIVE Final   Performed at [facility], [address] [place], [place] [address]   Glucose-Capillary 09/26/2022 192 (H)  70 - 99 mg/dL Final   Glucose reference range applies only to samples taken after fasting for at least 8 hours.   Lipase 09/26/2022 64 (H)  11 - 51 U/L Final   Performed at [facility], [address] [place], [place] [address]   Sodium 09/26/2022 135  135 - 145 mmol/L Final   Potassium 09/26/2022 3.6  3.5 - 5.1 mmol/L Final   Chloride 09/26/2022 102  98 - 111 mmol/L Final   CO2 09/26/2022 20 (L)  22 - 32 mmol/L Final   Glucose, Bld 09/26/2022 298 (H)  70 - 99 mg/dL Final   Glucose reference range applies only to samples taken after fasting for at least 8 hours.   BUN 09/26/2022 10  6 - 20 mg/dL Final   Creatinine, Ser 09/26/2022 0.71  0.61 - 1.24 mg/dL Final   Calcium 40/34/7425 9.5  8.9 - 10.3 mg/dL Final   Total Protein 95/63/8756 7.3  6.5 - 8.1 g/dL Final   Albumin 43/32/9518 3.9  3.5 - 5.0 g/dL Final   AST 84/16/6063 161 (H)  15 - 41 U/L Final   ALT 09/26/2022 185 (H)  0 - 44 U/L Final   Alkaline Phosphatase 09/26/2022 103  38 - 126 U/L Final   Total Bilirubin 09/26/2022 0.8  0.3 - 1.2 mg/dL Final   GFR, Estimated 09/26/2022 >60  >60 mL/min Final   Comment: (NOTE) Calculated using the CKD-EPI Creatinine Equation (2021)    Anion gap 09/26/2022 13  5 - 15 Final   Performed at [facility], [address] [place], [place] [address]   Glucose-Capillary 09/26/2022  315 (H)  70 - 99  mg/dL Final   Glucose reference range applies only to samples taken after fasting for at least 8 hours.   Glucose-Capillary 09/26/2022 387 (H)  70 - 99 mg/dL Final   Glucose reference range applies only to samples taken after fasting for at least 8 hours.   Glucose-Capillary 09/26/2022 404 (H)  70 - 99 mg/dL Final   Glucose reference range applies only to samples taken after fasting for at least 8 hours.  There may be more visits with results that are not included.    Allergies: Patient has no known allergies.  Medications:  Facility Ordered Medications  Medication   acetaminophen (TYLENOL) tablet 650 mg   alum & mag hydroxide-simeth (MAALOX/MYLANTA) 200-200-20 MG/5ML suspension 30 mL   magnesium hydroxide (MILK OF MAGNESIA) suspension 30 mL   [COMPLETED] thiamine (VITAMIN B1) injection 100 mg   [START ON 12/12/2022] thiamine (VITAMIN B1) tablet 100 mg   multivitamin with minerals tablet 1 tablet   LORazepam (ATIVAN) tablet 1 mg   hydrOXYzine (ATARAX) tablet 25 mg   loperamide (IMODIUM) capsule 2-4 mg   ondansetron (ZOFRAN-ODT) disintegrating tablet 4 mg   OLANZapine zydis (ZYPREXA) disintegrating tablet 10 mg   And   LORazepam (ATIVAN) tablet 1 mg   And   ziprasidone (GEODON) injection 20 mg   LORazepam (ATIVAN) tablet 1 mg   Followed by   Melene Muller ON 12/12/2022] LORazepam (ATIVAN) tablet 1 mg   Followed by   Melene Muller ON 12/13/2022] LORazepam (ATIVAN) tablet 1 mg   Followed by   Melene Muller ON 12/15/2022] LORazepam (ATIVAN) tablet 1 mg   PTA Medications  Medication Sig   chlordiazePOXIDE (LIBRIUM) 10 MG capsule Take 1 capsule (10 mg total) by mouth 4 (four) times daily for 3 days, THEN 1 capsule (10 mg total) 3 (three) times daily for 3 days, THEN 1 capsule (10 mg total) 2 (two) times daily for 3 days, THEN 1 capsule (10 mg total) daily for 3 days. (Patient not taking: Reported on 12/10/2022)   metFORMIN (GLUCOPHAGE-XR) 750 MG 24 hr tablet Take 2 tablets (1,500 mg total) by mouth daily  with breakfast. (Patient not taking: Reported on 12/08/2022)   thiamine (VITAMIN B1) 100 MG tablet Take 1 tablet (100 mg total) by mouth daily.   Multiple Vitamin (MULTIVITAMIN WITH MINERALS) TABS tablet Take 1 tablet by mouth daily.   folic acid (FOLVITE) 1 MG tablet Take 1 tablet (1 mg total) by mouth daily.    Long Term Goals: Improvement in symptoms so as ready for discharge  Short Term Goals: Patient will verbalize feelings in meetings with treatment team members., Patient will attend at least of 50% of the groups daily., Pt will complete the PHQ9 on admission, day 3 and discharge., Patient will participate in completing the [place] Suicide Severity Rating Scale, Patient will score a low risk of violence for 24 hours prior to discharge, and Patient will take medications as prescribed daily.  Medical Decision Making  Inpatient [facility]    Recommendations  Based on my evaluation the patient does not appear to have an emergency medical condition.  [name], NP 12/11/22  5:40 AM

## 2022-12-11 NOTE — Progress Notes (Signed)
Pt's CIWA was 0. 

## 2022-12-11 NOTE — ED Notes (Signed)
Pt is A & O x 4. Pt is oriented to the unit and provided with meal. Medication has been administered to him and pt is lying on his bed. Pt denies SI/HI/AVH. Initial assessment completed. Will continue to monitor for safety and provide support.

## 2022-12-12 DIAGNOSIS — F1010 Alcohol abuse, uncomplicated: Secondary | ICD-10-CM | POA: Diagnosis not present

## 2022-12-12 DIAGNOSIS — I10 Essential (primary) hypertension: Secondary | ICD-10-CM | POA: Diagnosis not present

## 2022-12-12 DIAGNOSIS — E119 Type 2 diabetes mellitus without complications: Secondary | ICD-10-CM | POA: Diagnosis not present

## 2022-12-12 DIAGNOSIS — E1165 Type 2 diabetes mellitus with hyperglycemia: Secondary | ICD-10-CM | POA: Diagnosis not present

## 2022-12-12 DIAGNOSIS — E785 Hyperlipidemia, unspecified: Secondary | ICD-10-CM | POA: Diagnosis not present

## 2022-12-12 LAB — GLUCOSE, CAPILLARY
Glucose-Capillary: 109 mg/dL — ABNORMAL HIGH (ref 70–99)
Glucose-Capillary: 123 mg/dL — ABNORMAL HIGH (ref 70–99)
Glucose-Capillary: 151 mg/dL — ABNORMAL HIGH (ref 70–99)
Glucose-Capillary: 137 mg/dL — ABNORMAL HIGH (ref 70–99)

## 2022-12-12 LAB — HEMOGLOBIN A1C
Hgb A1c MFr Bld: 6.8 % — ABNORMAL HIGH (ref 4.8–5.6)
Mean Plasma Glucose: 148.46 mg/dL

## 2022-12-12 NOTE — ED Notes (Signed)
Patient denies SI/HI and AVH. Patient has ate breakfast and taken his medications. Patient is being monitored for safety. Patient is being monitored for safety.

## 2022-12-12 NOTE — Group Note (Signed)
Group Topic: Communication  Group Date: 12/12/2022 Start Time: 1936 End Time: 2000 Facilitators: Rae Lips B  Department: [facility]  Number of Participants: 10  Group Focus: activities of daily living skills Treatment Modality:  Leisure Development Interventions utilized were story telling Purpose: enhance coping skills, express feelings, increase insight, regain self-worth, reinforce self-care, and relapse prevention strategies  Name: Khadeem Rockett Date of Birth: January 16, 1966  MR: [id number]    Level of Participation: active Quality of Participation: attentive Interactions with others: gave feedback Mood/Affect: appropriate Triggers (if applicable): NA Cognition: coherent/clear Progress: Gaining insight Response: NA Plan: patient will be encouraged to keep going to groups.   Patients Problems:  Patient Active Problem List   Diagnosis Date Noted   Alcohol withdrawal (HCC) 12/08/2022   Abnormal TSH 12/08/2022   Hypokalemia 12/08/2022   CKD (chronic kidney disease), stage III (HCC) 12/08/2022   Alcohol use disorder 12/01/2022   Frequent falls 12/01/2022   Elevated liver enzymes    Alcohol abuse    Liver failure (HCC) 05/07/2020   Alcoholic hepatitis    Hyponatremia    Polycythemia    Hemoglobin A1c less than 7.0% 07/16/2019   Neck pain 03/31/2019   Muscle strain 03/31/2019   Undifferentiated abdominal pain 03/31/2019   Gastroesophageal reflux disease without esophagitis 11/24/2018   Chest discomfort 11/24/2018   Essential hypertension 11/24/2018   Pre-diabetes 11/24/2018   Hyperlipidemia 10/06/2016   Chest pain 09/26/2016   Uncontrolled type 2 diabetes mellitus with hyperglycemia, without long-term current use of insulin (HCC) 09/26/2016

## 2022-12-12 NOTE — ED Notes (Signed)
Pt is calm and cooperative no pain or distress noted pt is on courtyard being pleasant and talking with other patients's

## 2022-12-12 NOTE — ED Provider Notes (Signed)
Behavioral Health Progress Note  Date and Time: 12/12/2022 9:41 AM Name: Derek Cruz MRN:  [id number]  Subjective: Vitals within normal limits.  No acute events were 9.  A1c 6.8.  Glucose yesterday: 107, 192, 155.  Glucose 109 this morning.  No refusals.  No PRNs.  Patient on Ativan taper day 1 of 4.  CIWAs: 0, 2, 0.  On brief interview this morning, patient said that he is feeling "good."  Sleeping well.  No new problems.  On repeated interrogation, patient said that he is feeling good.  Was going to get breakfast.  Denies: wanting to hurt himself, wanting to hurt other people, and seeing things that are not there.  Writer repeatedly told patient that he should reach out to staff if he has any new problems.   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 Lives in [place] with his wife and 3 kids; son - 61 years old, daughter - 81 years old, daughter 43 years old. He denies having any legal issues related to his drinking.   Additional Social History:    Pain Medications: see MAR Prescriptions: see MAR Over the Counter: see MAR History of alcohol / drug use?: Yes Longest period of sobriety (when/how long): none reported Negative Consequences of Use: Personal relationships Withdrawal Symptoms: Blackouts, Irritability, Nausea / Vomiting, Patient aware of relationship between substance abuse and physical/medical complications, Tremors, Weakness Name of Substance 1: alcohol 1 - Age of First Use: "all my life" 1 - Amount (size/oz): 12 pack 1 - Frequency: daily 1 - Duration: ongoing 1 - Last Use / Amount: yesterday 1 - Method of Aquiring: store purchase                   Sleep: Good  Appetite:  Fair  Current Medications:  Current Facility-Administered Medications  Medication Dose Route Frequency Provider Last Rate Last Admin   acetaminophen (TYLENOL) tablet 650 mg  650 mg Oral Q6H PRN [name], NP       alum & mag hydroxide-simeth (MAALOX/MYLANTA) 200-200-20 MG/5ML suspension 30 mL  30 mL Oral Q4H PRN [name], NP       hydrOXYzine (ATARAX) tablet 25 mg  25 mg Oral Q6H PRN [name], NP       loperamide (IMODIUM) capsule 2-4 mg  2-4 mg Oral PRN [name], NP       LORazepam (ATIVAN) tablet 1 mg  1 mg Oral Q6H PRN [name], NP       OLANZapine zydis (ZYPREXA) disintegrating tablet 10 mg  10 mg Oral Q8H PRN [name], NP       And   LORazepam (ATIVAN) tablet 1 mg  1 mg Oral PRN [name], NP       And   ziprasidone (GEODON) injection 20 mg  20 mg Intramuscular PRN [name], NP       LORazepam (ATIVAN) tablet 1 mg  1 mg Oral QID [name], NP   1 mg at 12/12/22 6269   Followed by   LORazepam (ATIVAN) tablet 1 mg  1 mg Oral TID [name], NP       Followed by   [  START ON 12/13/2022] LORazepam (ATIVAN) tablet 1 mg  1 mg Oral BID [name], NP       Followed by   Melene Muller ON 12/15/2022] LORazepam (ATIVAN) tablet 1 mg  1 mg Oral Daily [name], NP       magnesium hydroxide (MILK OF MAGNESIA) suspension 30 mL  30 mL Oral Daily PRN [name], NP       metFORMIN (GLUCOPHAGE-XR) 24 hr tablet 1,500 mg  1,500 mg Oral QHS Tomie China, MD   1,500 mg at 12/11/22 2124   multivitamin with minerals tablet 1 tablet  1 tablet Oral Daily [name], NP   1 tablet at 12/12/22 0905   ondansetron (ZOFRAN-ODT) disintegrating tablet 4 mg  4 mg Oral Q6H PRN [name], NP       thiamine (VITAMIN B1) tablet 100 mg  100 mg Oral Daily [name], NP   100 mg at 12/12/22 5409   Current Outpatient Medications  Medication Sig Dispense Refill   chlordiazePOXIDE (LIBRIUM) 10 MG capsule Take 1  capsule (10 mg total) by mouth 4 (four) times daily for 3 days, THEN 1 capsule (10 mg total) 3 (three) times daily for 3 days, THEN 1 capsule (10 mg total) 2 (two) times daily for 3 days, THEN 1 capsule (10 mg total) daily for 3 days. (Patient not taking: Reported on 12/10/2022) 30 capsule 0   folic acid (FOLVITE) 1 MG tablet Take 1 tablet (1 mg total) by mouth daily. 30 tablet 0   metFORMIN (GLUCOPHAGE-XR) 750 MG 24 hr tablet Take 2 tablets (1,500 mg total) by mouth daily with breakfast. (Patient not taking: Reported on 12/08/2022) 60 tablet 0   Multiple Vitamin (MULTIVITAMIN WITH MINERALS) TABS tablet Take 1 tablet by mouth daily. 130 tablet 0   thiamine (VITAMIN B1) 100 MG tablet Take 1 tablet (100 mg total) by mouth daily. 30 tablet 0    Labs  Lab Results:  Admission on 12/11/2022  Component Date Value Ref Range Status   Hgb A1c MFr Bld 12/12/2022 6.8 (H)  4.8 - 5.6 % Final   Comment: (NOTE) Pre diabetes:          5.7%-6.4%  Diabetes:              >6.4%  Glycemic control for   <7.0% adults with diabetes    Mean Plasma Glucose 12/12/2022 148.46  mg/dL Final   Performed at [facility], [address], [place], [place] [address]   Glucose-Capillary 12/11/2022 107 (H)  70 - 99 mg/dL Final   Glucose reference range applies only to samples taken after fasting for at least 8 hours.   Glucose-Capillary 12/11/2022 192 (H)  70 - 99 mg/dL Final   Glucose reference range applies only to samples taken after fasting for at least 8 hours.   Glucose-Capillary 12/11/2022 155 (H)  70 - 99 mg/dL Final   Glucose reference range applies only to samples taken after fasting for at least 8 hours.   Glucose-Capillary 12/12/2022 109 (H)  70 - 99 mg/dL Final   Glucose reference range applies only to samples taken after fasting for at least 8 hours.  Admission on 12/10/2022, Discharged on 12/11/2022  Component Date Value Ref Range Status   Sodium 12/10/2022 134 (L)  135 - 145 mmol/L Final   Potassium  12/10/2022 3.6  3.5 - 5.1 mmol/L Final   Chloride 12/10/2022 99  98 - 111 mmol/L Final   CO2 12/10/2022 25  22 - 32  mmol/L Final   Glucose, Bld 12/10/2022 177 (H)  70 - 99 mg/dL Final   Glucose reference range applies only to samples taken after fasting for at least 8 hours.   BUN 12/10/2022 13  6 - 20 mg/dL Final   Creatinine, Ser 12/10/2022 0.78  0.61 - 1.24 mg/dL Final   Calcium 16/02/9603 8.8 (L)  8.9 - 10.3 mg/dL Final   Total Protein 54/01/8118 6.7  6.5 - 8.1 g/dL Final   Albumin 14/78/2956 3.6  3.5 - 5.0 g/dL Final   AST 21/30/8657 33  15 - 41 U/L Final   ALT 12/10/2022 48 (H)  0 - 44 U/L Final   Alkaline Phosphatase 12/10/2022 81  38 - 126 U/L Final   Total Bilirubin 12/10/2022 0.7  0.3 - 1.2 mg/dL Final   GFR, Estimated 12/10/2022 >60  >60 mL/min Final   Comment: (NOTE) Calculated using the CKD-EPI Creatinine Equation (2021)    Anion gap 12/10/2022 10  5 - 15 Final   Performed at [facility], [address], [place], [place] [address]   Alcohol, Ethyl (B) 12/10/2022 <10  <10 mg/dL Final   Comment: (NOTE) Lowest detectable limit for serum alcohol is 10 mg/dL.  For medical purposes only. Performed at [facility], [address] [place], [place] [address]    Opiates 12/10/2022 NONE DETECTED  NONE DETECTED Final   Cocaine 12/10/2022 NONE DETECTED  NONE DETECTED Final   Benzodiazepines 12/10/2022 POSITIVE (A)  NONE DETECTED Final   Amphetamines 12/10/2022 NONE DETECTED  NONE DETECTED Final   Tetrahydrocannabinol 12/10/2022 NONE DETECTED  NONE DETECTED Final   Barbiturates 12/10/2022 POSITIVE (A)  NONE DETECTED Final   Comment: (NOTE) DRUG SCREEN FOR MEDICAL PURPOSES ONLY.  IF CONFIRMATION IS NEEDED FOR ANY PURPOSE, NOTIFY LAB WITHIN 5 DAYS.  LOWEST DETECTABLE LIMITS FOR URINE DRUG SCREEN Drug Class                     Cutoff (ng/mL) Amphetamine and metabolites    1000 Barbiturate and metabolites    200 Benzodiazepine                 200 Opiates and  metabolites        300 Cocaine and metabolites        300 THC                            50 Performed at [facility], [address], [place], [place] [address]    WBC 12/10/2022 5.1  4.0 - 10.5 K/uL Final   RBC 12/10/2022 4.11 (L)  4.22 - 5.81 MIL/uL Final   Hemoglobin 12/10/2022 13.0  13.0 - 17.0 g/dL Final   HCT 40/02/2724 37.5 (L)  39.0 - 52.0 % Final   MCV 12/10/2022 91.2  80.0 - 100.0 fL Final   MCH 12/10/2022 31.6  26.0 - 34.0 pg Final   MCHC 12/10/2022 34.7  30.0 - 36.0 g/dL Final   RDW 36/64/4034 13.5  11.5 - 15.5 % Final   Platelets 12/10/2022 249  150 - 400 K/uL Final   nRBC 12/10/2022 0.0  0.0 - 0.2 % Final   Neutrophils Relative % 12/10/2022 50  % Final   Neutro Abs 12/10/2022 2.6  1.7 - 7.7 K/uL Final   Lymphocytes Relative 12/10/2022 31  % Final   Lymphs Abs 12/10/2022 1.6  0.7 - 4.0 K/uL Final   Monocytes Relative  12/10/2022 13  % Final   Monocytes Absolute 12/10/2022 0.7  0.1 - 1.0 K/uL Final   Eosinophils Relative 12/10/2022 5  % Final   Eosinophils Absolute 12/10/2022 0.2  0.0 - 0.5 K/uL Final   Basophils Relative 12/10/2022 1  % Final   Basophils Absolute 12/10/2022 0.1  0.0 - 0.1 K/uL Final   Immature Granulocytes 12/10/2022 0  % Final   Abs Immature Granulocytes 12/10/2022 0.01  0.00 - 0.07 K/uL Final   Performed at [facility], [address] [place], [place] [address]  Admission on 12/10/2022, Discharged on 12/10/2022  Component Date Value Ref Range Status   Sodium 12/10/2022 134 (L)  135 - 145 mmol/L Final   Potassium 12/10/2022 4.0  3.5 - 5.1 mmol/L Final   Chloride 12/10/2022 103  98 - 111 mmol/L Final   CO2 12/10/2022 20 (L)  22 - 32 mmol/L Final   Glucose, Bld 12/10/2022 97  70 - 99 mg/dL Final   Glucose reference range applies only to samples taken after fasting for at least 8 hours.   BUN 12/10/2022 10  6 - 20 mg/dL Final   Creatinine, Ser 12/10/2022 0.67  0.61 - 1.24 mg/dL Final   Calcium 02/54/2706 9.0  8.9 - 10.3 mg/dL Final    Total Protein 12/10/2022 7.4  6.5 - 8.1 g/dL Final   Albumin 23/76/2831 4.0  3.5 - 5.0 g/dL Final   AST 51/76/1607 42 (H)  15 - 41 U/L Final   ALT 12/10/2022 56 (H)  0 - 44 U/L Final   Alkaline Phosphatase 12/10/2022 77  38 - 126 U/L Final   Total Bilirubin 12/10/2022 0.7  0.3 - 1.2 mg/dL Final   GFR, Estimated 12/10/2022 >60  >60 mL/min Final   Comment: (NOTE) Calculated using the CKD-EPI Creatinine Equation (2021)    Anion gap 12/10/2022 11  5 - 15 Final   Performed at [facility], [address] [place], [place] [address]   Alcohol, Ethyl (B) 12/10/2022 <10  <10 mg/dL Final   Comment: (NOTE) Lowest detectable limit for serum alcohol is 10 mg/dL.  For medical purposes only. Performed at [facility], [address], [place], [place] [address]    WBC 12/10/2022 5.3  4.0 - 10.5 K/uL Final   RBC 12/10/2022 4.49  4.22 - 5.81 MIL/uL Final   Hemoglobin 12/10/2022 14.3  13.0 - 17.0 g/dL Final   HCT 54/62/7035 41.3  39.0 - 52.0 % Final   MCV 12/10/2022 92.0  80.0 - 100.0 fL Final   MCH 12/10/2022 31.8  26.0 - 34.0 pg Final   MCHC 12/10/2022 34.6  30.0 - 36.0 g/dL Final   RDW 00/93/8182 13.8  11.5 - 15.5 % Final   Platelets 12/10/2022 251  150 - 400 K/uL Final   nRBC 12/10/2022 0.0  0.0 - 0.2 % Final   Performed at [facility], [address], [place], [place] [address]   Opiates 12/10/2022 NONE DETECTED  NONE DETECTED Final   Cocaine 12/10/2022 NONE DETECTED  NONE DETECTED Final   Benzodiazepines 12/10/2022 POSITIVE (A)  NONE DETECTED Final   Amphetamines 12/10/2022 NONE DETECTED  NONE DETECTED Final   Tetrahydrocannabinol 12/10/2022 NONE DETECTED  NONE DETECTED Final   Barbiturates 12/10/2022 POSITIVE (A)  NONE DETECTED Final   Comment: (NOTE) DRUG SCREEN FOR MEDICAL PURPOSES ONLY.  IF CONFIRMATION IS NEEDED FOR ANY PURPOSE, NOTIFY LAB WITHIN 5 DAYS.  LOWEST DETECTABLE LIMITS FOR URINE DRUG SCREEN Drug Class  Cutoff (ng/mL) Amphetamine  and metabolites    1000 Barbiturate and metabolites    200 Benzodiazepine                 200 Opiates and metabolites        300 Cocaine and metabolites        300 THC                            50 Performed at [facility], [address], [place], [place] [address]   Admission on 12/08/2022, Discharged on 12/09/2022  Component Date Value Ref Range Status   WBC 12/08/2022 6.6  4.0 - 10.5 K/uL Final   RBC 12/08/2022 4.10 (L)  4.22 - 5.81 MIL/uL Final   Hemoglobin 12/08/2022 13.4  13.0 - 17.0 g/dL Final   HCT 60/45/4098 38.2 (L)  39.0 - 52.0 % Final   MCV 12/08/2022 93.2  80.0 - 100.0 fL Final   MCH 12/08/2022 32.7  26.0 - 34.0 pg Final   MCHC 12/08/2022 35.1  30.0 - 36.0 g/dL Final   RDW 11/91/4782 14.6  11.5 - 15.5 % Final   Platelets 12/08/2022 209  150 - 400 K/uL Final   nRBC 12/08/2022 0.0  0.0 - 0.2 % Final   Performed at [facility], [address], [place], [place] [address]   Troponin I (High Sensitivity) 12/08/2022 6  <18 ng/L Final   Comment: (NOTE) Elevated high sensitivity troponin I (hsTnI) values and significant  changes across serial measurements may suggest ACS but many other  chronic and acute conditions are known to elevate hsTnI results.  Refer to the "Links" section for chest pain algorithms and additional  guidance. Performed at [facility], [address] [place], [place] [address]    Sodium 12/08/2022 140  135 - 145 mmol/L Final   Potassium 12/08/2022 3.2 (L)  3.5 - 5.1 mmol/L Final   Chloride 12/08/2022 108  98 - 111 mmol/L Final   CO2 12/08/2022 19 (L)  22 - 32 mmol/L Final   Glucose, Bld 12/08/2022 147 (H)  70 - 99 mg/dL Final   Glucose reference range applies only to samples taken after fasting for at least 8 hours.   BUN 12/08/2022 <5 (L)  6 - 20 mg/dL Final   Creatinine, Ser 12/08/2022 1.22  0.61 - 1.24 mg/dL Final   Calcium 78/46/9629 8.6 (L)  8.9 - 10.3 mg/dL Final   Total Protein 52/84/1324 7.2  6.5 - 8.1 g/dL Final    Albumin 40/02/2724 3.8  3.5 - 5.0 g/dL Final   AST 36/64/4034 40  15 - 41 U/L Final   ALT 12/08/2022 61 (H)  0 - 44 U/L Final   Alkaline Phosphatase 12/08/2022 75  38 - 126 U/L Final   Total Bilirubin 12/08/2022 0.5  0.3 - 1.2 mg/dL Final   GFR, Estimated 12/08/2022 >60  >60 mL/min Final   Comment: (NOTE) Calculated using the CKD-EPI Creatinine Equation (2021)    Anion gap 12/08/2022 13  5 - 15 Final   Performed at [facility], [address] [place], [place] [address]   Lipase 12/08/2022 28  11 - 51 U/L Final   Performed at [facility], [address] [place], [place] [address]   Alcohol, Ethyl (B) 12/08/2022 48 (H)  <10 mg/dL Final   Comment: (NOTE) Lowest detectable limit for serum alcohol is 10 mg/dL.  For  medical purposes only. Performed at [facility], [address], [place], [place] [address]    Troponin I (High Sensitivity) 12/08/2022 6  <18 ng/L Final   Comment: (NOTE) Elevated high sensitivity troponin I (hsTnI) values and significant  changes across serial measurements may suggest ACS but many other  chronic and acute conditions are known to elevate hsTnI results.  Refer to the "Links" section for chest pain algorithms and additional  guidance. Performed at [facility], [address], [place], [place] [address]    Total CK 12/08/2022 238  49 - 397 U/L Final   Performed at [facility], [address] [place], [place] [address]   Magnesium 12/08/2022 2.1  1.7 - 2.4 mg/dL Final   Performed at [facility], [address] [place], [place] [address]   Phosphorus 12/08/2022 3.1  2.5 - 4.6 mg/dL Final   Performed at [facility], [address], [place], [place] [address]   Prothrombin Time 12/08/2022 15.0  11.4 - 15.2 seconds Final   INR 12/08/2022 1.2  0.8 - 1.2 Final   Comment: (NOTE) INR goal varies based on device and disease states. Performed at [facility], [address] [place], [place] [address]     Prealbumin 12/09/2022 26  18 - 38 mg/dL Final   Performed at [facility], [address], [place], [place] [address]   Total Protein 12/08/2022 6.0 (L)  6.5 - 8.1 g/dL Final   Albumin 96/29/5284 3.1 (L)  3.5 - 5.0 g/dL Final   AST 13/24/4010 29  15 - 41 U/L Final   ALT 12/08/2022 49 (H)  0 - 44 U/L Final   Alkaline Phosphatase 12/08/2022 58  38 - 126 U/L Final   Total Bilirubin 12/08/2022 0.5  0.3 - 1.2 mg/dL Final   Bilirubin, Direct 12/08/2022 <0.1  0.0 - 0.2 mg/dL Final   Indirect Bilirubin 12/08/2022 NOT CALCULATED  0.3 - 0.9 mg/dL Final   Performed at [facility], [address], [place], [place] [address]   Ammonia 12/08/2022 16  9 - 35 umol/L Final   Performed at [facility], [address], [place], [place] [address]   TSH 12/08/2022 0.172 (L)  0.350 - 4.500 uIU/mL Final   Comment: Performed by a 3rd Generation assay with a functional sensitivity of <=0.01 uIU/mL. Performed at [facility], [address] [place], [place] [address]    Osmolality, Ur 12/08/2022 442  300 - 900 mOsm/kg Final   Performed at [facility], [address] [place], [place] [address]   Creatinine, Urine 12/08/2022 59  mg/dL Final   Performed at [facility], [address], [place], [place] [address]   Sodium, Ur 12/08/2022 123  mmol/L Final   Performed at [facility], [address] [place], [place] [address]   Sodium 12/08/2022 138  135 - 145 mmol/L Final   Potassium 12/08/2022 3.4 (L)  3.5 - 5.1 mmol/L Final   Chloride 12/08/2022 108  98 - 111 mmol/L Final   CO2 12/08/2022 23  22 - 32 mmol/L Final   Glucose, Bld 12/08/2022 125 (H)  70 - 99 mg/dL Final   Glucose reference range applies only to samples taken after fasting for at least 8 hours.   BUN 12/08/2022 <5 (L)  6 - 20 mg/dL Final   Creatinine, Ser 12/08/2022 1.13  0.61 - 1.24 mg/dL  Final   Calcium 12/08/2022 8.3 (L)  8.9 - 10.3 mg/dL Final   GFR, Estimated 12/08/2022 >60  >60 mL/min Final    Comment: (NOTE) Calculated using the CKD-EPI Creatinine Equation (2021)    Anion gap 12/08/2022 7  5 - 15 Final   Performed at [facility], [address], [place], [place] [address]   Osmolality 12/08/2022 320 (H)  275 - 295 mOsm/kg Final   Performed at [facility], [address], [place], [place] [address]   Magnesium 12/09/2022 2.1  1.7 - 2.4 mg/dL Final   Performed at [facility] Lab, [address] [place], [place] [address]   Phosphorus 12/09/2022 3.7  2.5 - 4.6 mg/dL Final   Performed at [facility], [address] [place], [place] [address]   Sodium 12/09/2022 138  135 - 145 mmol/L Final   Potassium 12/09/2022 3.3 (L)  3.5 - 5.1 mmol/L Final   Chloride 12/09/2022 108  98 - 111 mmol/L Final   CO2 12/09/2022 23  22 - 32 mmol/L Final   Glucose, Bld 12/09/2022 132 (H)  70 - 99 mg/dL Final   Glucose reference range applies only to samples taken after fasting for at least 8 hours.   BUN 12/09/2022 <5 (L)  6 - 20 mg/dL Final   Creatinine, Ser 12/09/2022 1.08  0.61 - 1.24 mg/dL Final   Calcium 74/25/9563 8.8 (L)  8.9 - 10.3 mg/dL Final   Total Protein 87/56/4332 6.5  6.5 - 8.1 g/dL Final   Albumin 95/18/8416 3.4 (L)  3.5 - 5.0 g/dL Final   AST 60/63/0160 31  15 - 41 U/L Final   ALT 12/09/2022 49 (H)  0 - 44 U/L Final   Alkaline Phosphatase 12/09/2022 66  38 - 126 U/L Final   Total Bilirubin 12/09/2022 0.5  0.3 - 1.2 mg/dL Final   GFR, Estimated 12/09/2022 >60  >60 mL/min Final   Comment: (NOTE) Calculated using the CKD-EPI Creatinine Equation (2021)    Anion gap 12/09/2022 7  5 - 15 Final   Performed at [facility], [address], [place], [place] [address]   WBC 12/09/2022 5.3  4.0 - 10.5 K/uL Final   RBC 12/09/2022 3.69 (L)  4.22 - 5.81 MIL/uL Final   Hemoglobin 12/09/2022 12.1 (L)  13.0 - 17.0 g/dL Final   HCT 35/57/3220 34.6 (L)  39.0 - 52.0 % Final   MCV 12/09/2022 93.8  80.0 - 100.0 fL Final   MCH 12/09/2022 32.8  26.0 - 34.0 pg Final    MCHC 12/09/2022 35.0  30.0 - 36.0 g/dL Final   RDW 25/42/7062 14.6  11.5 - 15.5 % Final   Platelets 12/09/2022 194  150 - 400 K/uL Final   nRBC 12/09/2022 0.0  0.0 - 0.2 % Final   Performed at [facility], [address], [place], [place] [address]   Weight 12/09/2022 2,688  oz Final   Height 12/09/2022 68  in Final   BP 12/09/2022 133/95  mmHg Final   S' Lateral 12/09/2022 3.00  cm Final   AR max vel 12/09/2022 2.41  cm2 Final   AV Area VTI 12/09/2022 2.28  cm2 Final   AV Mean grad 12/09/2022 4.0  mmHg Final   AV Peak grad 12/09/2022 5.5  mmHg Final   Ao pk vel 12/09/2022 1.17  m/s Final   Area-P 1/2 12/09/2022 3.42  cm2 Final   AV Area mean vel 12/09/2022 2.22  cm2 Final   Est EF 12/09/2022 60 - 65%   Final   Beta-Hydroxybutyric Acid 12/08/2022 0.10  0.05 - 0.27 mmol/L Final   Performed at [facility], [address], [place], [place] [address]   Glucose-Capillary 12/08/2022 120 (H)  70 - 99 mg/dL Final   Glucose reference range applies only to samples taken after fasting for at least 8 hours.   Cholesterol 12/09/2022 183  0 - 200 mg/dL Final   Triglycerides 60/45/4098 91  <150 mg/dL Final   HDL 11/91/4782 50  >40 mg/dL Final   Total CHOL/HDL Ratio 12/09/2022 3.7  RATIO Final   VLDL 12/09/2022 18  0 - 40 mg/dL Final   LDL Cholesterol 12/09/2022 115 (H)  0 - 99 mg/dL Final   Comment:        Total Cholesterol/HDL:CHD Risk Coronary Heart Disease Risk Table                     Men   Women  1/2 Average Risk   3.4   3.3  Average Risk       5.0   4.4  2 X Average Risk   9.6   7.1  3 X Average Risk  23.4   11.0        Use the calculated Patient Ratio above and the CHD Risk Table to determine the patient's CHD Risk.        ATP III CLASSIFICATION (LDL):  <100     mg/dL   Optimal  956-213  mg/dL   Near or Above                    Optimal  130-159  mg/dL   Borderline  086-578  mg/dL   High  >469     mg/dL   Very High Performed at [facility], [address] [place], [place] [address]    Lipoprotein (a) 12/09/2022 47.5 (H)  <75.0 nmol/L Final   Comment: (NOTE) Note:  Values greater than or equal to 75.0 nmol/L may       indicate an independent risk factor for CHD,       but must be evaluated with caution when applied       to non-Caucasian populations due to the       influence of genetic factors on Lp(a) across       ethnicities. Performed At: [facility] [address] [place], [place] [id number] Jolene Schimke MD [id number]:[id number]    Free T4 12/09/2022 0.78  0.61 - 1.12 ng/dL Final   Comment: (NOTE) Biotin ingestion may interfere with free T4 tests. If the results are inconsistent with the TSH level, previous test results, or the clinical presentation, then consider biotin interference. If needed, order repeat testing after stopping biotin. Performed at [facility] Lab, [address], [place], [place] [address]    D-Dimer, Quant 12/09/2022 0.43  0.00 - 0.50 ug/mL-FEU Final   Comment: (NOTE) At the manufacturer cut-off value of 0.5 g/mL FEU, this assay has a negative predictive value of 95-100%.This assay is intended for use in conjunction with a clinical pretest probability (PTP) assessment model to exclude pulmonary embolism (PE) and deep venous thrombosis (DVT) in outpatients suspected of PE or DVT. Results should be correlated with clinical presentation. Performed at [facility], [address], [place], [place] [address]    T3, Total 12/09/2022 103  71 - 180 ng/dL Final  Comment: (NOTE) Performed At: [facility] [address] [place], [place] [id number] Jolene Schimke MD KG:[id number]    Glucose-Capillary 12/08/2022 108 (H)  70 - 99 mg/dL Final   Glucose reference range applies only to samples taken after fasting for at least 8 hours.   Glucose-Capillary 12/09/2022 122 (H)  70 - 99 mg/dL Final   Glucose reference range applies only to samples taken after fasting for at least 8 hours.    Glucose-Capillary 12/09/2022 115 (H)  70 - 99 mg/dL Final   Glucose reference range applies only to samples taken after fasting for at least 8 hours.   Glucose-Capillary 12/09/2022 148 (H)  70 - 99 mg/dL Final   Glucose reference range applies only to samples taken after fasting for at least 8 hours.   Glucose-Capillary 12/09/2022 109 (H)  70 - 99 mg/dL Final   Glucose reference range applies only to samples taken after fasting for at least 8 hours.  Admission on 12/07/2022, Discharged on 12/08/2022  Component Date Value Ref Range Status   Sodium 12/07/2022 141  135 - 145 mmol/L Final   Potassium 12/07/2022 3.4 (L)  3.5 - 5.1 mmol/L Final   Chloride 12/07/2022 103  98 - 111 mmol/L Final   CO2 12/07/2022 21 (L)  22 - 32 mmol/L Final   Glucose, Bld 12/07/2022 242 (H)  70 - 99 mg/dL Final   Glucose reference range applies only to samples taken after fasting for at least 8 hours.   BUN 12/07/2022 8  6 - 20 mg/dL Final   Creatinine, Ser 12/07/2022 1.80 (H)  0.61 - 1.24 mg/dL Final   DELTA CHECK NOTED   Calcium 12/07/2022 9.1  8.9 - 10.3 mg/dL Final   Total Protein 64/40/3474 7.7  6.5 - 8.1 g/dL Final   Albumin 25/95/6387 4.1  3.5 - 5.0 g/dL Final   AST 56/43/3295 60 (H)  15 - 41 U/L Final   ALT 12/07/2022 81 (H)  0 - 44 U/L Final   Alkaline Phosphatase 12/07/2022 81  38 - 126 U/L Final   Total Bilirubin 12/07/2022 0.6  0.3 - 1.2 mg/dL Final   GFR, Estimated 12/07/2022 43 (L)  >60 mL/min Final   Comment: (NOTE) Calculated using the CKD-EPI Creatinine Equation (2021)    Anion gap 12/07/2022 17 (H)  5 - 15 Final   Performed at [facility] Lab, [address], [place], [place] [address]   Alcohol, Ethyl (B) 12/07/2022 <10  <10 mg/dL Final   Comment: (NOTE) Lowest detectable limit for serum alcohol is 10 mg/dL.  For medical purposes only. Performed at [facility] Lab, [address] [place], [place] [address]    Salicylate Lvl 12/07/2022 <7.0 (L)  7.0 - 30.0 mg/dL Final    Performed at [facility] Lab, [address] [place], [place] [address]   Acetaminophen (Tylenol), Serum 12/07/2022 <10 (L)  10 - 30 ug/mL Final   Comment: (NOTE) Therapeutic concentrations vary significantly. A range of 10-30 ug/mL  may be an effective concentration for many patients. However, some  are best treated at concentrations outside of this range. Acetaminophen concentrations >150 ug/mL at 4 hours after ingestion  and >50 ug/mL at 12 hours after ingestion are often associated with  toxic reactions.  Performed at [facility] Lab, [address] [place], [place] [address]    WBC 12/07/2022 9.7  4.0 - 10.5 K/uL Final   RBC 12/07/2022 4.75  4.22 - 5.81 MIL/uL Final   Hemoglobin  12/07/2022 15.3  13.0 - 17.0 g/dL Final   HCT 16/02/9603 43.0  39.0 - 52.0 % Final   MCV 12/07/2022 90.5  80.0 - 100.0 fL Final   MCH 12/07/2022 32.2  26.0 - 34.0 pg Final   MCHC 12/07/2022 35.6  30.0 - 36.0 g/dL Final   RDW 54/01/8118 14.6  11.5 - 15.5 % Final   Platelets 12/07/2022 213  150 - 400 K/uL Final   nRBC 12/07/2022 0.0  0.0 - 0.2 % Final   Performed at [facility] Lab, [address], [place], [place] [address]   Opiates 12/08/2022 NONE DETECTED  NONE DETECTED Final   Cocaine 12/08/2022 NONE DETECTED  NONE DETECTED Final   Benzodiazepines 12/08/2022 POSITIVE (A)  NONE DETECTED Final   Amphetamines 12/08/2022 NONE DETECTED  NONE DETECTED Final   Tetrahydrocannabinol 12/08/2022 NONE DETECTED  NONE DETECTED Final   Barbiturates 12/08/2022 NONE DETECTED  NONE DETECTED Final   Comment: (NOTE) DRUG SCREEN FOR MEDICAL PURPOSES ONLY.  IF CONFIRMATION IS NEEDED FOR ANY PURPOSE, NOTIFY LAB WITHIN 5 DAYS.  LOWEST DETECTABLE LIMITS FOR URINE DRUG SCREEN Drug Class                     Cutoff (ng/mL) Amphetamine and metabolites    1000 Barbiturate and metabolites    200 Benzodiazepine                 200 Opiates and metabolites        300 Cocaine and metabolites        300 THC                             50 Performed at [facility] Lab, [address], [place], [place] [address]    Beta-Hydroxybutyric Acid 12/07/2022 0.10  0.05 - 0.27 mmol/L Final   Performed at [facility] Lab, [address], [place], [place] [address]   Lactic Acid, Venous 12/07/2022 2.4 (HH)  0.5 - 1.9 mmol/L Final   Comment: CRITICAL RESULT CALLED TO, READ BACK BY AND VERIFIED WITH Birdie Hopes RN @ [phone] 12/07/22 JBUTLER Performed at [facility], [address] [place], [place] [address]    Lactic Acid, Venous 12/08/2022 2.0 (HH)  0.5 - 1.9 mmol/L Final   Comment: CRITICAL VALUE NOTED. VALUE IS CONSISTENT WITH PREVIOUSLY REPORTED/CALLED VALUE Performed at [facility] Lab, [address] [place], [place] [address]    Magnesium 12/07/2022 2.5 (H)  1.7 - 2.4 mg/dL Final   Comment: HEMOLYSIS AT THIS LEVEL MAY AFFECT RESULT Performed at [facility] Lab, [address] [place], [place] [address]    Sodium 12/08/2022 142  135 - 145 mmol/L Final   Potassium 12/08/2022 3.2 (L)  3.5 - 5.1 mmol/L Final   Chloride 12/08/2022 110  98 - 111 mmol/L Final   CO2 12/08/2022 19 (L)  22 - 32 mmol/L Final   Glucose, Bld 12/08/2022 158 (H)  70 - 99 mg/dL Final   Glucose reference range applies only to samples taken after fasting for at least 8 hours.   BUN 12/08/2022 8  6 - 20 mg/dL Final   Creatinine, Ser 12/08/2022 1.70 (H)  0.61 - 1.24 mg/dL Final   Calcium 02/72/5366 7.9 (L)  8.9 - 10.3 mg/dL Final   GFR, Estimated 12/08/2022 46 (L)  >60 mL/min Final   Comment: (NOTE) Calculated using the CKD-EPI Creatinine Equation (2021)  Anion gap 12/08/2022 13  5 - 15 Final   Performed at [facility] Lab, [address] [place], [place] [address]   Osmolality 12/08/2022 345 (HH)  275 - 295 mOsm/kg Final   Comment: REPEATED TO VERIFY CRITICAL RESULT CALLED TO, READ BACK BY AND VERIFIED WITH: GASQUE,S RN 12/08/2022 AT 0238 SKEEN,P Performed at [facility] Lab, [address], [place],  [place] [address]   No results displayed because visit has over 200 results.    Admission on 09/29/2022, Discharged on 10/01/2022  Component Date Value Ref Range Status   Glucose-Capillary 09/30/2022 238 (H)  70 - 99 mg/dL Final   Glucose reference range applies only to samples taken after fasting for at least 8 hours.   Glucose-Capillary 09/30/2022 159 (H)  70 - 99 mg/dL Final   Glucose reference range applies only to samples taken after fasting for at least 8 hours.   Glucose-Capillary 09/30/2022 191 (H)  70 - 99 mg/dL Final   Glucose reference range applies only to samples taken after fasting for at least 8 hours.   Glucose-Capillary 09/30/2022 275 (H)  70 - 99 mg/dL Final   Glucose reference range applies only to samples taken after fasting for at least 8 hours.   Glucose-Capillary 09/30/2022 247 (H)  70 - 99 mg/dL Final   Glucose reference range applies only to samples taken after fasting for at least 8 hours.   Glucose-Capillary 09/30/2022 236 (H)  70 - 99 mg/dL Final   Glucose reference range applies only to samples taken after fasting for at least 8 hours.   Glucose-Capillary 10/01/2022 156 (H)  70 - 99 mg/dL Final   Glucose reference range applies only to samples taken after fasting for at least 8 hours.   Glucose-Capillary 10/01/2022 186 (H)  70 - 99 mg/dL Final   Glucose reference range applies only to samples taken after fasting for at least 8 hours.   Glucose-Capillary 10/01/2022 139 (H)  70 - 99 mg/dL Final   Glucose reference range applies only to samples taken after fasting for at least 8 hours.  Admission on 09/29/2022, Discharged on 09/29/2022  Component Date Value Ref Range Status   WBC 09/29/2022 5.7  4.0 - 10.5 K/uL Final   RBC 09/29/2022 4.72  4.22 - 5.81 MIL/uL Final   Hemoglobin 09/29/2022 15.1  13.0 - 17.0 g/dL Final   HCT 84/69/6295 43.1  39.0 - 52.0 % Final   MCV 09/29/2022 91.3  80.0 - 100.0 fL Final   MCH 09/29/2022 32.0  26.0 - 34.0 pg Final   MCHC 09/29/2022 35.0   30.0 - 36.0 g/dL Final   RDW 28/41/3244 13.3  11.5 - 15.5 % Final   Platelets 09/29/2022 157  150 - 400 K/uL Final   nRBC 09/29/2022 0.0  0.0 - 0.2 % Final   Performed at [facility] Lab, [address] [place], [place] [address]   Neutrophils Relative % 09/29/2022 71  % Final   Neutro Abs 09/29/2022 4.1  1.7 - 7.7 K/uL Final   Lymphocytes Relative 09/29/2022 15  % Final   Lymphs Abs 09/29/2022 0.8  0.7 - 4.0 K/uL Final   Monocytes Relative 09/29/2022 12  % Final   Monocytes Absolute 09/29/2022 0.7  0.1 - 1.0 K/uL Final   Eosinophils Relative 09/29/2022 1  % Final   Eosinophils Absolute 09/29/2022 0.1  0.0 - 0.5 K/uL Final   Basophils Relative 09/29/2022 1  % Final   Basophils Absolute 09/29/2022 0.0  0.0 - 0.1 K/uL Final   Immature Granulocytes 09/29/2022 0  % Final   Abs Immature Granulocytes 09/29/2022 0.02  0.00 - 0.07 K/uL Final   Performed at [facility] Lab, [address] [place], [place] [address]   Sodium 09/29/2022 133 (L)  135 - 145 mmol/L Final   Potassium 09/29/2022 3.7  3.5 - 5.1 mmol/L Final   Chloride 09/29/2022 101  98 - 111 mmol/L Final   CO2 09/29/2022 23  22 - 32 mmol/L Final   Glucose, Bld 09/29/2022 206 (H)  70 - 99 mg/dL Final   Glucose reference range applies only to samples taken after fasting for at least 8 hours.   BUN 09/29/2022 11  6 - 20 mg/dL Final   Creatinine, Ser 09/29/2022 0.65  0.61 - 1.24 mg/dL Final   Calcium 60/45/4098 9.2  8.9 - 10.3 mg/dL Final   Total Protein 11/91/4782 7.0  6.5 - 8.1 g/dL Final   Albumin 95/62/1308 3.7  3.5 - 5.0 g/dL Final   AST 65/78/4696 126 (H)  15 - 41 U/L Final   ALT 09/29/2022 161 (H)  0 - 44 U/L Final   Alkaline Phosphatase 09/29/2022 115  38 - 126 U/L Final   Total Bilirubin 09/29/2022 0.7  0.3 - 1.2 mg/dL Final   GFR, Estimated 09/29/2022 >60  >60 mL/min Final   Comment: (NOTE) Calculated using the CKD-EPI Creatinine Equation (2021)    Anion gap 09/29/2022 9  5 - 15 Final   Performed at [facility] Lab, [address], [place], [place] [address]   Alcohol, Ethyl (B) 09/29/2022 <10  <10 mg/dL Final   Comment: (NOTE) Lowest detectable limit for serum alcohol is 10 mg/dL.  For medical purposes only. Performed at [facility], [address] [place], [place] [address]    Magnesium 09/29/2022 2.2  1.7 - 2.4 mg/dL Final   Performed at [facility], [address] [place], [place] [address]   Troponin I (High Sensitivity) 09/29/2022 6  <18 ng/L Final   Comment: (NOTE) Elevated high sensitivity troponin I (hsTnI) values and significant  changes across serial measurements may suggest ACS but many other  chronic and acute conditions are known to elevate hsTnI results.  Refer to the "Links" section for chest pain algorithms and additional  guidance. Performed at [facility] Lab, [address], [place], [place] [address]    Troponin I (High Sensitivity) 09/29/2022 5  <18 ng/L Final   Comment: (NOTE) Elevated high sensitivity troponin I (hsTnI) values and significant  changes across serial measurements may suggest ACS but many other  chronic and acute conditions are known to elevate hsTnI results.  Refer to the "Links" section for chest pain algorithms and additional  guidance. Performed at [facility] Lab, [address], [place], [place] [address]    Glucose-Capillary 09/29/2022 225 (H)  70 - 99 mg/dL Final   Glucose reference range applies only to samples taken after fasting for at least 8 hours.  Admission on 09/27/2022, Discharged on 09/29/2022  Component Date Value Ref Range Status   Glucose-Capillary 09/27/2022 271 (H)  70 - 99 mg/dL Final   Glucose reference range applies only to samples taken after fasting for at least 8 hours.   Glucose-Capillary 09/27/2022 252 (H)  70 - 99 mg/dL Final   Glucose reference range applies only to samples taken after fasting for at least 8 hours.   Glucose-Capillary 09/27/2022 284 (H)  70 -  99 mg/dL Final   Glucose  reference range applies only to samples taken after fasting for at least 8 hours.   Glucose-Capillary 09/27/2022 226 (H)  70 - 99 mg/dL Final   Glucose reference range applies only to samples taken after fasting for at least 8 hours.   Glucose-Capillary 09/27/2022 342 (H)  70 - 99 mg/dL Final   Glucose reference range applies only to samples taken after fasting for at least 8 hours.   Glucose-Capillary 09/28/2022 255 (H)  70 - 99 mg/dL Final   Glucose reference range applies only to samples taken after fasting for at least 8 hours.   Glucose-Capillary 09/28/2022 324 (H)  70 - 99 mg/dL Final   Glucose reference range applies only to samples taken after fasting for at least 8 hours.   Glucose-Capillary 09/28/2022 309 (H)  70 - 99 mg/dL Final   Glucose reference range applies only to samples taken after fasting for at least 8 hours.   Glucose-Capillary 09/28/2022 311 (H)  70 - 99 mg/dL Final   Glucose reference range applies only to samples taken after fasting for at least 8 hours.   Glucose-Capillary 09/29/2022 305 (H)  70 - 99 mg/dL Final   Glucose reference range applies only to samples taken after fasting for at least 8 hours.  Admission on 09/26/2022, Discharged on 09/27/2022  Component Date Value Ref Range Status   WBC 09/26/2022 8.1  4.0 - 10.5 K/uL Final   RBC 09/26/2022 4.88  4.22 - 5.81 MIL/uL Final   Hemoglobin 09/26/2022 15.9  13.0 - 17.0 g/dL Final   HCT 16/02/9603 44.4  39.0 - 52.0 % Final   MCV 09/26/2022 91.0  80.0 - 100.0 fL Final   MCH 09/26/2022 32.6  26.0 - 34.0 pg Final   MCHC 09/26/2022 35.8  30.0 - 36.0 g/dL Final   RDW 54/01/8118 13.5  11.5 - 15.5 % Final   Platelets 09/26/2022 150  150 - 400 K/uL Final   nRBC 09/26/2022 0.0  0.0 - 0.2 % Final   Performed at [facility] Lab, [address], [place], [place] [address]   Sodium 09/26/2022 131 (L)  135 - 145 mmol/L Final   Potassium 09/26/2022 4.2  3.5 - 5.1 mmol/L Final   Chloride 09/26/2022 98  98 - 111 mmol/L  Final   CO2 09/26/2022 18 (L)  22 - 32 mmol/L Final   Glucose, Bld 09/26/2022 296 (H)  70 - 99 mg/dL Final   Glucose reference range applies only to samples taken after fasting for at least 8 hours.   BUN 09/26/2022 18  6 - 20 mg/dL Final   Creatinine, Ser 09/26/2022 0.88  0.61 - 1.24 mg/dL Final   Calcium 95/62/1308 9.9  8.9 - 10.3 mg/dL Final   Total Protein 65/78/4696 7.1  6.5 - 8.1 g/dL Final   Albumin 29/52/8413 3.8  3.5 - 5.0 g/dL Final   AST 24/40/1027 112 (H)  15 - 41 U/L Final   ALT 09/26/2022 172 (H)  0 - 44 U/L Final   Alkaline Phosphatase 09/26/2022 162 (H)  38 - 126 U/L Final   Total Bilirubin 09/26/2022 0.6  0.3 - 1.2 mg/dL Final   GFR, Estimated 09/26/2022 >60  >60 mL/min Final   Comment: (NOTE) Calculated using the CKD-EPI Creatinine Equation (2021)    Anion gap 09/26/2022 15  5 - 15 Final   Performed at [facility] Lab, [address] [place], [place] [address]   Glucose-Capillary 09/26/2022 281 (H)  70 - 99 mg/dL Final   Glucose reference range applies only to samples taken after fasting for at least 8 hours.   Alcohol, Ethyl (B) 09/26/2022 <10  <10 mg/dL Final   Comment: (NOTE) Lowest detectable limit for serum alcohol is 10 mg/dL.  For medical purposes only. Performed at [facility] Lab, [address], [place], [place] [address]    Color, Urine 09/26/2022 YELLOW  YELLOW Final   APPearance 09/26/2022 CLEAR  CLEAR Final   Specific Gravity, Urine 09/26/2022 1.023  1.005 - 1.030 Final   pH 09/26/2022 5.0  5.0 - 8.0 Final   Glucose, UA 09/26/2022 >=500 (A)  NEGATIVE mg/dL Final   Hgb urine dipstick 09/26/2022 NEGATIVE  NEGATIVE Final   Bilirubin Urine 09/26/2022 NEGATIVE  NEGATIVE Final   Ketones, ur 09/26/2022 NEGATIVE  NEGATIVE mg/dL Final   Protein, ur 19/14/7829 NEGATIVE  NEGATIVE mg/dL Final   Nitrite 56/21/3086 NEGATIVE  NEGATIVE Final   Leukocytes,Ua 09/26/2022 NEGATIVE  NEGATIVE Final   RBC / HPF 09/26/2022 0-5  0 - 5 RBC/hpf Final   WBC, UA  09/26/2022 0-5  0 - 5 WBC/hpf Final   Bacteria, UA 09/26/2022 RARE (A)  NONE SEEN Final   Squamous Epithelial / HPF 09/26/2022 0-5  0 - 5 /HPF Final   Performed at [facility] Lab, [address] [place], [place] [address]   Opiates 09/26/2022 NONE DETECTED  NONE DETECTED Final   Cocaine 09/26/2022 NONE DETECTED  NONE DETECTED Final   Benzodiazepines 09/26/2022 POSITIVE (A)  NONE DETECTED Final   Amphetamines 09/26/2022 NONE DETECTED  NONE DETECTED Final   Tetrahydrocannabinol 09/26/2022 NONE DETECTED  NONE DETECTED Final   Barbiturates 09/26/2022 NONE DETECTED  NONE DETECTED Final   Comment: (NOTE) DRUG SCREEN FOR MEDICAL PURPOSES ONLY.  IF CONFIRMATION IS NEEDED FOR ANY PURPOSE, NOTIFY LAB WITHIN 5 DAYS.  LOWEST DETECTABLE LIMITS FOR URINE DRUG SCREEN Drug Class                     Cutoff (ng/mL) Amphetamine and metabolites    1000 Barbiturate and metabolites    200 Benzodiazepine                 200 Opiates and metabolites        300 Cocaine and metabolites        300 THC                            50 Performed at [facility] Lab, [address] [place], [place] [address]    pH, Ven 09/26/2022 7.437 (H)  7.25 - 7.43 Final   pCO2, Ven 09/26/2022 31.6 (L)  44 - 60 mmHg Final   pO2, Ven 09/26/2022 87 (H)  32 - 45 mmHg Final   Bicarbonate 09/26/2022 21.3  20.0 - 28.0 mmol/L Final   TCO2 09/26/2022 22  22 - 32 mmol/L Final   O2 Saturation 09/26/2022 97  % Final   Acid-base deficit 09/26/2022 2.0  0.0 - 2.0 mmol/L Final   Sodium 09/26/2022 133 (L)  135 - 145 mmol/L Final   Potassium 09/26/2022 3.6  3.5 - 5.1 mmol/L Final   Calcium, Ion 09/26/2022 1.19  1.15 - 1.40 mmol/L Final   HCT 09/26/2022 42.0  39.0 - 52.0 % Final   Hemoglobin 09/26/2022 14.3  13.0 - 17.0 g/dL Final   Sample type 28/41/3244 VENOUS   Final   Glucose-Capillary 09/26/2022 294 (  H)  70 - 99 mg/dL Final   Glucose reference range applies only to samples taken after fasting for at least 8 hours.  There  may be more visits with results that are not included.    Blood Alcohol level:  Lab Results  Component Value Date   ETH <10 12/10/2022   ETH <10 12/10/2022    Metabolic Disorder Labs: Lab Results  Component Value Date   HGBA1C 6.8 (H) 12/12/2022   MPG 148.46 12/12/2022   MPG 263.26 09/25/2022   No results found for: "PROLACTIN" Lab Results  Component Value Date   CHOL 183 12/09/2022   TRIG 91 12/09/2022   HDL 50 12/09/2022   CHOLHDL 3.7 12/09/2022   VLDL 18 12/09/2022   LDLCALC 115 (H) 12/09/2022   LDLCALC 60 09/25/2022    Therapeutic Lab Levels: No results found for: "LITHIUM" No results found for: "VALPROATE" No results found for: "CBMZ"  Physical Findings   PHQ2-9    Flowsheet Row ED from 12/11/2022 in [facility] ED from 09/29/2022 in [facility] ED from 09/27/2022 in [facility] ED from 09/25/2022 in [facility] Office Visit from 01/15/2022 in [facility]  PHQ-2 Total Score 1 0 0 2 0  PHQ-9 Total Score -- 4 -- -- --      Flowsheet Row ED from 12/11/2022 in [facility] Most recent reading at 12/11/2022  4:57 AM ED from 12/10/2022 in [facility] Emergency Department at [facility] Most recent reading at 12/10/2022  6:38 PM ED from 12/10/2022 in [facility] Emergency Department at [facility] Most recent reading at 12/10/2022  3:52 PM  C-SSRS RISK CATEGORY No Risk No Risk No Risk        Musculoskeletal  Strength & Muscle Tone: within normal limits Gait & Station: normal Patient leans: Front  Psychiatric Specialty Exam  Presentation  General Appearance:  Appropriate for Environment  Eye Contact: Good  Speech: Clear and Coherent  Speech Volume: Normal  Handedness: Right   Mood and Affect  Mood: Euphoric (Bright)  Affect: Congruent   Thought Process  Thought  Processes: Coherent  Descriptions of Associations:Intact  Orientation:Full (Time, Place and Person)  Thought Content:Logical  Diagnosis of Schizophrenia or Schizoaffective disorder in past: No    Hallucinations:Hallucinations: None  Ideas of Reference:None  Suicidal Thoughts:Suicidal Thoughts: No  Homicidal Thoughts:Homicidal Thoughts: No   Sensorium  Memory: Immediate Fair; Recent Fair; Remote Fair  Judgment: Poor  Insight: Poor   Executive Functions  Concentration: Fair  Attention Span: Fair  Recall: [employer] of Knowledge: Fair  Language: Fair   Psychomotor Activity  Psychomotor Activity: Normal   Agricultural engineer; Desire for Improvement; Housing; Social Support   Sleep  Sleep: Good Number of Hours of Sleep: 8   Nutritional Assessment (For OBS and FBC admissions only) Has the patient had a weight loss or gain of 10 pounds or more in the last 3 months?: No Has the patient had a decrease in food intake/or appetite?: No Does the patient have dental problems?: No Does the patient have eating habits or behaviors that may be indicators of an eating disorder including binging or inducing vomiting?: No Has the patient recently lost weight without trying?: 0 Has the patient been eating poorly because of a decreased appetite?: 0 Malnutrition Screening Tool Score: 0    Physical Exam  Physical Exam Vitals  and nursing note reviewed.  Constitutional:      General: He is not in acute distress.    Appearance: He is not diaphoretic.  HENT:     Head: Normocephalic and atraumatic.  Eyes:     Conjunctiva/sclera: Conjunctivae normal.  Pulmonary:     Effort: Pulmonary effort is normal.  Musculoskeletal:     Cervical back: Normal range of motion.  Neurological:     Mental Status: He is alert and oriented to person, place, and time.    Review of Systems  Constitutional:  Negative for diaphoresis.  Eyes: Negative.   Respiratory:   Negative for shortness of breath.   Cardiovascular:  Negative for chest pain.  Gastrointestinal: Negative.   Genitourinary: Negative.   Skin:  Negative for itching and rash.  Neurological:  Negative for dizziness, tingling, tremors and headaches.  Psychiatric/Behavioral:  Positive for substance abuse. Negative for hallucinations and suicidal ideas. The patient is not nervous/anxious.    Blood pressure 117/86, pulse 79, temperature 98.1 F (36.7 C), temperature source Oral, resp. rate 18, SpO2 98%. There is no height or weight on file to calculate BMI.  Treatment Plan Summary: Daily contact with patient to assess and evaluate symptoms and progress in treatment  #Alcohol use disorder: #History of agitation:  This is a 57 year old male with a past medical history of type 2 diabetes and a past psychiatric history of alcohol use disorder, severe who is here for alcohol detox and referral to residential substance use treatment.  Patient appears happy today.  While patient's English is limited, he appeared to understand simple questions such as whether or not he was feeling sad or wanted to hurt himself.  No new complaints today.  Will conduct a more thorough interview with patient before discharge with interpreter to gather detail about patient's mood and social circumstances.  After speaking with son yesterday, it appears that his alcohol use, while chronic, became more pronounced only recently and may be related to an adjustment disorder (his wife leaving him.)  Per nursing notes and immediate impression, patient appears in good spirits.  No acute concern for SI, HI, or AVH at this time.  Patient's blood glucoses maxed out at 192 yesterday.  Put on home dose metformin 1500 mg nightly.  We will continue to monitor blood glucose over the course of the day.  His CIWAs are 0, 2, 0.  Does not appear to be under acute alcohol withdrawal at this time.  Will monitor for hypoglycemic symptoms.  #Adjustment  disorder, severe #Substance-induced mood disorder, depressive type - Will explore extent of depression in the setting of sobriety, and act accordingly. -Consider starting SSRI.  Could also adjunct with low-dose lithium to 150 mg in the setting of normal kidney function.  CIWA Ativan protocol initiated: -lorazepam (Ativan) 1 mg 4 times daily x4 doses, 1 mg 3 times daily x3 doses, 1 mg 2 times daily x2 doses, 1 mg daily x1 dose -lorazepam (Ativan) 1 mg every 6 hours as needed for CIWA greater than 10; Hydroxyzine 25 mg for CIWA less than 10 -Multivitamin with minerals 1 tablet daily -Ondansetron disintegrating tablet 4 mg every 6 as needed/nausea or vomiting -Loperamide 2 to 4 mg oral as needed/diarrhea or loose stools -Thiamine injection 100 mg IM once -Thiamine tablet 500 mg Q8 hours  Agitation PRNs as follows: - Olanzapine Zydis 10 mg sublingual every 8 hours as needed for agitation. - Ativan 1 mg p.o. as needed for anxiety/severe agitation. - Ziprasidone 20 mg IM  as needed for agitation.  Chronic medical conditions Hypertension and Hyperlipidemia - will continue to hold statin and aspirin at this time. Diabetes Mellitus Type 2 - Will switch from insulin aspartate to metformin 1500 nightly (home dose).  While home med Rx says he should take them in the morning, patient says he is regularly taking it at night. Will consider insulin regimen if glucose blood levels regularly greater than 150. -Hypoglycemia protocol -A1c ordered  Tomie China, MD, PGY-1

## 2022-12-12 NOTE — ED Notes (Signed)
Patient was given lunch. 

## 2022-12-12 NOTE — ED Notes (Signed)
Pt sleeping@this time. breathing even and unlabored. Will continue to monitor for safety 

## 2022-12-12 NOTE — ED Notes (Signed)
Patient was provided dinner

## 2022-12-12 NOTE — Discharge Planning (Signed)
LCSW went and spoke with the patient to provide updates. Patient was informed that [facility] in [facility] would like for him to call in to complete a phone screening. Patient reports he may need support with making the phone call and LCSW will assist as needed. Patient reports he would prefer to stay in [facility] and understands that LCSW is currently awaiting update from [facility] regarding referral. Once update has been provided, patient will be informed. Patient expressed understanding. No other needs were reported by the patient. LCSW will continue to follow and provide support to patient while on [facility] unit.   Fernande Boyden, LCSW Clinical Social Worker [facility] Ph: [phone]

## 2022-12-12 NOTE — Group Note (Unsigned)
Group Topic: Understanding Self  Group Date: 12/12/2022 Start Time: 1610 End Time: 1645 Facilitators: Oleh Genin, RN  Department: [facility]  Number of Participants: 10  Group Focus: acceptance, coping skills, daily focus, discharge education, and problem solving Treatment Modality:  Psychoeducation Interventions utilized were mental fitness, patient education, and problem solving Purpose: enhance coping skills, express feelings, and increase insight   Name: Derek Cruz Date of Birth: June 03, 1965  MR: [id number]    Level of Participation: {THERAPIES; PSYCH GROUP PARTICIPATION DUKGU:54270} Quality of Participation: {THERAPIES; PSYCH QUALITY OF PARTICIPATION:23992} Interactions with others: {THERAPIES; PSYCH INTERACTIONS:23993} Mood/Affect: {THERAPIES; PSYCH MOOD/AFFECT:23994} Triggers (if applicable): *** Cognition: {THERAPIES; PSYCH COGNITION:23995} Progress: {THERAPIES; PSYCH PROGRESS:23997} Response: *** Plan: {THERAPIES; PSYCH WCBJ:62831}  Patients Problems:  Patient Active Problem List   Diagnosis Date Noted   Alcohol withdrawal (HCC) 12/08/2022   Abnormal TSH 12/08/2022   Hypokalemia 12/08/2022   CKD (chronic kidney disease), stage III (HCC) 12/08/2022   Alcohol use disorder 12/01/2022   Frequent falls 12/01/2022   Elevated liver enzymes    Alcohol abuse    Liver failure (HCC) 05/07/2020   Alcoholic hepatitis    Hyponatremia    Polycythemia    Hemoglobin A1c less than 7.0% 07/16/2019   Neck pain 03/31/2019   Muscle strain 03/31/2019   Undifferentiated abdominal pain 03/31/2019   Gastroesophageal reflux disease without esophagitis 11/24/2018   Chest discomfort 11/24/2018   Essential hypertension 11/24/2018   Pre-diabetes 11/24/2018   Hyperlipidemia 10/06/2016   Chest pain 09/26/2016   Uncontrolled type 2 diabetes mellitus with hyperglycemia, without long-term current use of insulin (HCC) 09/26/2016

## 2022-12-12 NOTE — ED Notes (Signed)
Patient was given breakfast.  

## 2022-12-12 NOTE — Progress Notes (Signed)
Pt's CIWA was 0. 

## 2022-12-12 NOTE — Group Note (Signed)
Group Topic: Wellness  Group Date: 12/11/2022 Start Time: 1900 End Time: 1930 Facilitators: Emmit Pomfret D, NT  Department: [facility]  Number of Participants: 1  Group Focus: acceptance Treatment Modality:  Psychoeducation Interventions utilized were assignment Purpose: increase insight  Name: Derek Cruz Date of Birth: [birth year]  MR: [id number]    Level of Participation: withdrawn Quality of Participation: attentive Interactions with others:   Mood/Affect:   Triggers (if applicable):   Cognition:   Progress: Other Response:   Plan: follow-up needed  Patients Problems:  Patient Active Problem List   Diagnosis Date Noted   Alcohol withdrawal (HCC) 12/08/2022   Abnormal TSH 12/08/2022   Hypokalemia 12/08/2022   CKD (chronic kidney disease), stage III (HCC) 12/08/2022   Alcohol use disorder 12/01/2022   Frequent falls 12/01/2022   Elevated liver enzymes    Alcohol abuse    Liver failure (HCC) 05/07/2020   Alcoholic hepatitis    Hyponatremia    Polycythemia    Hemoglobin A1c less than 7.0% 07/16/2019   Neck pain 03/31/2019   Muscle strain 03/31/2019   Undifferentiated abdominal pain 03/31/2019   Gastroesophageal reflux disease without esophagitis 11/24/2018   Chest discomfort 11/24/2018   Essential hypertension 11/24/2018   Pre-diabetes 11/24/2018   Hyperlipidemia 10/06/2016   Chest pain 09/26/2016   Uncontrolled type 2 diabetes mellitus with hyperglycemia, without long-term current use of insulin (HCC) 09/26/2016

## 2022-12-12 NOTE — Group Note (Signed)
Group Topic: Understanding Self  Group Date: 12/12/2022 Start Time: 1610 End Time: 1650 Facilitators: Oleh Genin, RN  Department: [facility]  Number of Participants: 10  Group Focus: acceptance, communication, and coping skills Treatment Modality:  Psychoeducation Interventions utilized were exploration, mental fitness, patient education, and problem solving Purpose: enhance coping skills, express feelings, and improve communication skills  Name: Derek Cruz Date of Birth: 1965-08-12  MR: [id number]    Level of Participation: minimal Quality of Participation: attentive, engaged, motivated, and quiet Interactions with others: gave feedback Mood/Affect: appropriate and brightens with interaction Triggers (if applicable): N/A Cognition: insightful Progress: Moderate Response: Patient participated during group.  Plan: follow-up needed  Patients Problems:  Patient Active Problem List   Diagnosis Date Noted   Alcohol withdrawal (HCC) 12/08/2022   Abnormal TSH 12/08/2022   Hypokalemia 12/08/2022   CKD (chronic kidney disease), stage III (HCC) 12/08/2022   Alcohol use disorder 12/01/2022   Frequent falls 12/01/2022   Elevated liver enzymes    Alcohol abuse    Liver failure (HCC) 05/07/2020   Alcoholic hepatitis    Hyponatremia    Polycythemia    Hemoglobin A1c less than 7.0% 07/16/2019   Neck pain 03/31/2019   Muscle strain 03/31/2019   Undifferentiated abdominal pain 03/31/2019   Gastroesophageal reflux disease without esophagitis 11/24/2018   Chest discomfort 11/24/2018   Essential hypertension 11/24/2018   Pre-diabetes 11/24/2018   Hyperlipidemia 10/06/2016   Chest pain 09/26/2016   Uncontrolled type 2 diabetes mellitus with hyperglycemia, without long-term current use of insulin (HCC) 09/26/2016

## 2022-12-12 NOTE — Transitions of Care (Post Inpatient/ED Visit) (Signed)
   12/12/2022  Name: Derek Cruz MRN: [id number] DOB: 1966-01-07  Today's TOC FU Call Status: Today's TOC FU Call Status:: Successful TOC FU Call Competed Unsuccessful Call (1st Attempt) Date: 12/10/22 Unsuccessful Call (2nd Attempt) Date: 12/11/22 Unsuccessful Call (3rd Attempt) Date: 12/12/22 [facility] FU Call Complete Date: 12/12/22  Attempted to reach the patient regarding the most recent Inpatient/ED visit.  Follow Up Plan: No further outreach attempts will be made at this time. We have been unable to contact the patient.  Signature   Woodfin Ganja LPN [facility] Nurse Health Advisor Direct Dial [phone]

## 2022-12-12 NOTE — Group Note (Signed)
Group Topic: Positive Affirmations  Group Date: 12/12/2022 Start Time: 1015 End Time: 1045 Facilitators: Rayvon Char, Donata Clay; Derycke, Maryjane Hurter, NT  Department: [facility]  Number of Participants: 9  Group Focus: affirmation Treatment Modality:  Psychoeducation Interventions utilized were patient education Purpose: increase insight  Name: Derek Cruz Date of Birth: 02/27/1966  MR: [id number]    Level of Participation: Patient did not attend group.   Patients Problems:  Patient Active Problem List   Diagnosis Date Noted   Alcohol withdrawal (HCC) 12/08/2022   Abnormal TSH 12/08/2022   Hypokalemia 12/08/2022   CKD (chronic kidney disease), stage III (HCC) 12/08/2022   Alcohol use disorder 12/01/2022   Frequent falls 12/01/2022   Elevated liver enzymes    Alcohol abuse    Liver failure (HCC) 05/07/2020   Alcoholic hepatitis    Hyponatremia    Polycythemia    Hemoglobin A1c less than 7.0% 07/16/2019   Neck pain 03/31/2019   Muscle strain 03/31/2019   Undifferentiated abdominal pain 03/31/2019   Gastroesophageal reflux disease without esophagitis 11/24/2018   Chest discomfort 11/24/2018   Essential hypertension 11/24/2018   Pre-diabetes 11/24/2018   Hyperlipidemia 10/06/2016   Chest pain 09/26/2016   Uncontrolled type 2 diabetes mellitus with hyperglycemia, without long-term current use of insulin (HCC) 09/26/2016

## 2022-12-12 NOTE — ED Notes (Signed)
Patient is sleeping. Respirations equal and unlabored, skin warm and dry. No change in assessment or acuity. Routine safety checks conducted according to facility protocol. Will continue to monitor for safety.   

## 2022-12-13 DIAGNOSIS — E119 Type 2 diabetes mellitus without complications: Secondary | ICD-10-CM | POA: Diagnosis not present

## 2022-12-13 DIAGNOSIS — E1165 Type 2 diabetes mellitus with hyperglycemia: Secondary | ICD-10-CM | POA: Diagnosis not present

## 2022-12-13 DIAGNOSIS — E785 Hyperlipidemia, unspecified: Secondary | ICD-10-CM | POA: Diagnosis not present

## 2022-12-13 DIAGNOSIS — I10 Essential (primary) hypertension: Secondary | ICD-10-CM | POA: Diagnosis not present

## 2022-12-13 DIAGNOSIS — F1010 Alcohol abuse, uncomplicated: Secondary | ICD-10-CM | POA: Diagnosis not present

## 2022-12-13 LAB — GLUCOSE, CAPILLARY
Glucose-Capillary: 102 mg/dL — ABNORMAL HIGH (ref 70–99)
Glucose-Capillary: 190 mg/dL — ABNORMAL HIGH (ref 70–99)
Glucose-Capillary: 108 mg/dL — ABNORMAL HIGH (ref 70–99)
Glucose-Capillary: 181 mg/dL — ABNORMAL HIGH (ref 70–99)

## 2022-12-13 NOTE — ED Notes (Signed)
Patient  sleeping in no acute stress. RR even and unlabored .Environment secured .Will continue to monitor for safely. 

## 2022-12-13 NOTE — ED Notes (Signed)
Patient blood sugar was 190. Notified provider states that it was ok. States to notify is it goes to 200

## 2022-12-13 NOTE — Group Note (Signed)
Group Topic: Recovery Basics  Group Date: 12/13/2022 Start Time: 2000 End Time: 2100 Facilitators: Guss Bunde  Department: [facility]  Number of Participants: 6  Group Focus: relapse prevention Treatment Modality:  Behavior Modification Therapy Interventions utilized were patient education Purpose: relapse prevention strategies  Name: Derek Cruz Date of Birth: 1965-10-27  MR: [id number]    Level of Participation: active Quality of Participation: cooperative Interactions with others: gave feedback Mood/Affect: appropriate Triggers (if applicable):  Cognition: goal directed Progress: Moderate Response:  Plan: patient will be encouraged to attend outside AA meetings  Patients Problems:  Patient Active Problem List   Diagnosis Date Noted   Alcohol withdrawal (HCC) 12/08/2022   Abnormal TSH 12/08/2022   Hypokalemia 12/08/2022   CKD (chronic kidney disease), stage III (HCC) 12/08/2022   Alcohol use disorder 12/01/2022   Frequent falls 12/01/2022   Elevated liver enzymes    Alcohol abuse    Liver failure (HCC) 05/07/2020   Alcoholic hepatitis    Hyponatremia    Polycythemia    Hemoglobin A1c less than 7.0% 07/16/2019   Neck pain 03/31/2019   Muscle strain 03/31/2019   Undifferentiated abdominal pain 03/31/2019   Gastroesophageal reflux disease without esophagitis 11/24/2018   Chest discomfort 11/24/2018   Essential hypertension 11/24/2018   Pre-diabetes 11/24/2018   Hyperlipidemia 10/06/2016   Chest pain 09/26/2016   Uncontrolled type 2 diabetes mellitus with hyperglycemia, without long-term current use of insulin (HCC) 09/26/2016

## 2022-12-13 NOTE — Group Note (Signed)
Group Topic: Wellness  Group Date: 12/13/2022 Start Time: 1140 End Time: 1220 Facilitators: Londell Moh, NT  Department: [facility]  Number of Participants: 10  Group Focus: other Nutrition Treatment Modality:  Psychoeducation Interventions utilized were patient education Purpose: increase insight  Name: Derek Cruz Date of Birth: [birth year]  MR: [id number]    Level of Participation: active Quality of Participation: attentive Interactions with others: gave feedback Mood/Affect: appropriate Triggers (if applicable): n/a Cognition: coherent/clear Progress: Gaining insight Response: Pt was active during group. Dietitian came in and spoke to group about healthy eating and good habits. Plan: patient will be encouraged to continue to attend groups  Patients Problems:  Patient Active Problem List   Diagnosis Date Noted   Alcohol withdrawal (HCC) 12/08/2022   Abnormal TSH 12/08/2022   Hypokalemia 12/08/2022   CKD (chronic kidney disease), stage III (HCC) 12/08/2022   Alcohol use disorder 12/01/2022   Frequent falls 12/01/2022   Elevated liver enzymes    Alcohol abuse    Liver failure (HCC) 05/07/2020   Alcoholic hepatitis    Hyponatremia    Polycythemia    Hemoglobin A1c less than 7.0% 07/16/2019   Neck pain 03/31/2019   Muscle strain 03/31/2019   Undifferentiated abdominal pain 03/31/2019   Gastroesophageal reflux disease without esophagitis 11/24/2018   Chest discomfort 11/24/2018   Essential hypertension 11/24/2018   Pre-diabetes 11/24/2018   Hyperlipidemia 10/06/2016   Chest pain 09/26/2016   Uncontrolled type 2 diabetes mellitus with hyperglycemia, without long-term current use of insulin (HCC) 09/26/2016

## 2022-12-13 NOTE — ED Notes (Signed)
Patient in room. Environment is secured. Will continue to monitor for safety. 

## 2022-12-13 NOTE — ED Notes (Signed)
Pt is in the dayroom watching TV with peers. Pt denies SI/HI/AVH. No acute distress noted. Will continue to monitor for safety. 

## 2022-12-13 NOTE — Group Note (Signed)
Group Topic: Communication  Group Date: 12/13/2022 Start Time: 1300 End Time: 1320 Facilitators: Merrie Roof, RN  Department: [facility]  Number of Participants: 7  Group Focus: coping skills Treatment Modality:  Patient-Centered Therapy Interventions utilized were assignment Purpose: express feelings  Name: Derek Cruz Date of Birth: [birth year]  MR: [id number]    Level of Participation: active Quality of Participation: cooperative Interactions with others: gave feedback Mood/Affect: appropriate Triggers (if applicable):  Cognition: goal directed Progress: Significant Response:  Plan: patient will be encouraged to continue with theearpy  Patients Problems:  Patient Active Problem List   Diagnosis Date Noted   Alcohol withdrawal (HCC) 12/08/2022   Abnormal TSH 12/08/2022   Hypokalemia 12/08/2022   CKD (chronic kidney disease), stage III (HCC) 12/08/2022   Alcohol use disorder 12/01/2022   Frequent falls 12/01/2022   Elevated liver enzymes    Alcohol abuse    Liver failure (HCC) 05/07/2020   Alcoholic hepatitis    Hyponatremia    Polycythemia    Hemoglobin A1c less than 7.0% 07/16/2019   Neck pain 03/31/2019   Muscle strain 03/31/2019   Undifferentiated abdominal pain 03/31/2019   Gastroesophageal reflux disease without esophagitis 11/24/2018   Chest discomfort 11/24/2018   Essential hypertension 11/24/2018   Pre-diabetes 11/24/2018   Hyperlipidemia 10/06/2016   Chest pain 09/26/2016   Uncontrolled type 2 diabetes mellitus with hyperglycemia, without long-term current use of insulin (HCC) 09/26/2016

## 2022-12-13 NOTE — Group Note (Signed)
Group Topic: Communication  Group Date: 12/13/2022 Start Time: 2130 End Time: 2200 Facilitators: Guss Bunde  Department: [facility]  Number of Participants:   Group Focus: Wrap Up Group Treatment Modality:   Interventions utilized were  Purpose: This group is focused on patient's question and concerns about standard care and goals relating to discharge planning and bedside manner.  Patients have a chance to share positive experiences and per support.  Name: Derek Cruz Date of Birth: [birth year]  MR: [id number]    Level of Participation: active Quality of Participation: attentive Interactions with others: Observant Mood/Affect: flat Triggers (if applicable):  Cognition:  Progress: Minimal Response: Pt said he was pleased with care and no present concerns. Plan:   Patients Problems:  Patient Active Problem List   Diagnosis Date Noted   Alcohol withdrawal (HCC) 12/08/2022   Abnormal TSH 12/08/2022   Hypokalemia 12/08/2022   CKD (chronic kidney disease), stage III (HCC) 12/08/2022   Alcohol use disorder 12/01/2022   Frequent falls 12/01/2022   Elevated liver enzymes    Alcohol abuse    Liver failure (HCC) 05/07/2020   Alcoholic hepatitis    Hyponatremia    Polycythemia    Hemoglobin A1c less than 7.0% 07/16/2019   Neck pain 03/31/2019   Muscle strain 03/31/2019   Undifferentiated abdominal pain 03/31/2019   Gastroesophageal reflux disease without esophagitis 11/24/2018   Chest discomfort 11/24/2018   Essential hypertension 11/24/2018   Pre-diabetes 11/24/2018   Hyperlipidemia 10/06/2016   Chest pain 09/26/2016   Uncontrolled type 2 diabetes mellitus with hyperglycemia, without long-term current use of insulin (HCC) 09/26/2016

## 2022-12-13 NOTE — ED Provider Notes (Addendum)
Behavioral Health Progress Note  Date and Time: 12/13/2022 8:25 AM Name: Derek Cruz MRN:  [id number]  Subjective: Vitals within normal limits.  No acute events overnight.  Glucoses yesterday: 123, 151, 137.  No refusals.  No PRNs.  CIWAs: 0, 0, 0.  Day 2 of 4 Ativan taper.  On brief interview this morning before breakfast, patient is feeling "good."  Is eating and sleeping okay.  Patient did seem somewhat down this morning, on further investigation he said that he "misses his family."  He has been in contact with his son in the evenings to say good night.  Per social work, patient has a phone call with [facility] at 10 AM this morning with a translator if necessary, is also under consideration for [facility].  Reviewed this information with patient, he is aware that this call is scheduled in a couple of hours.  No SI, HI, or AVH.   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 Lives in [place] with his wife and 3 kids; son - 74 years old, daughter - 27 years old, daughter 32 years old. He denies having any legal issues related to his drinking.   Additional Social History:    Pain Medications: see MAR Prescriptions: see MAR Over the Counter: see MAR History of alcohol / drug use?: Yes Longest period of sobriety (when/how long): none reported Negative Consequences of Use: Personal relationships Withdrawal Symptoms: Blackouts, Irritability, Nausea / Vomiting, Patient aware of relationship between substance abuse and physical/medical complications, Tremors, Weakness Name of Substance 1: alcohol 1 - Age of First Use: "all my life" 1 - Amount (size/oz): 12 pack 1 -  Frequency: daily 1 - Duration: ongoing 1 - Last Use / Amount: yesterday 1 - Method of Aquiring: store purchase                  Sleep: Good  Appetite:  Fair  Current Medications:  Current Facility-Administered Medications  Medication Dose Route Frequency Provider Last Rate Last Admin   acetaminophen (TYLENOL) tablet 650 mg  650 mg Oral Q6H PRN [name], NP       alum & mag hydroxide-simeth (MAALOX/MYLANTA) 200-200-20 MG/5ML suspension 30 mL  30 mL Oral Q4H PRN [name], NP       hydrOXYzine (ATARAX) tablet 25 mg  25 mg Oral Q6H PRN [name], NP       loperamide (IMODIUM) capsule 2-4 mg  2-4 mg Oral PRN [name], NP       LORazepam (ATIVAN) tablet 1 mg  1 mg Oral Q6H PRN [name], NP       OLANZapine zydis (ZYPREXA) disintegrating tablet 10 mg  10 mg Oral Q8H PRN [name], NP       And   LORazepam (ATIVAN) tablet 1 mg  1 mg Oral PRN [name], NP       And   ziprasidone (GEODON) injection 20 mg  20 mg Intramuscular PRN [name], NP       LORazepam (ATIVAN) tablet 1 mg  1 mg Oral TID [name], NP   1 mg at 12/12/22 2123   Followed by   LORazepam (ATIVAN)  tablet 1 mg  1 mg Oral BID [name], NP       Followed by   Melene Muller ON 12/15/2022] LORazepam (ATIVAN) tablet 1 mg  1 mg Oral Daily [name], NP       magnesium hydroxide (MILK OF MAGNESIA) suspension 30 mL  30 mL Oral Daily PRN [name], NP       metFORMIN (GLUCOPHAGE-XR) 24 hr tablet 1,500 mg  1,500 mg Oral QHS Tomie China, MD   1,500 mg at 12/12/22 2123   multivitamin with minerals tablet 1 tablet  1 tablet Oral Daily [name], NP   1 tablet at 12/12/22 0905   ondansetron (ZOFRAN-ODT) disintegrating tablet 4 mg  4 mg Oral Q6H PRN [name], NP       thiamine (VITAMIN B1) tablet 100 mg  100 mg Oral Daily [name], NP   100 mg at 12/12/22 2956   Current Outpatient Medications  Medication Sig Dispense Refill   chlordiazePOXIDE (LIBRIUM) 10 MG capsule  Take 1 capsule (10 mg total) by mouth 4 (four) times daily for 3 days, THEN 1 capsule (10 mg total) 3 (three) times daily for 3 days, THEN 1 capsule (10 mg total) 2 (two) times daily for 3 days, THEN 1 capsule (10 mg total) daily for 3 days. (Patient not taking: Reported on 12/10/2022) 30 capsule 0   folic acid (FOLVITE) 1 MG tablet Take 1 tablet (1 mg total) by mouth daily. 30 tablet 0   metFORMIN (GLUCOPHAGE-XR) 750 MG 24 hr tablet Take 2 tablets (1,500 mg total) by mouth daily with breakfast. (Patient not taking: Reported on 12/08/2022) 60 tablet 0   Multiple Vitamin (MULTIVITAMIN WITH MINERALS) TABS tablet Take 1 tablet by mouth daily. 130 tablet 0   thiamine (VITAMIN B1) 100 MG tablet Take 1 tablet (100 mg total) by mouth daily. 30 tablet 0    Labs  Lab Results:  Admission on 12/11/2022  Component Date Value Ref Range Status   Hgb A1c MFr Bld 12/12/2022 6.8 (H)  4.8 - 5.6 % Final   Comment: (NOTE) Pre diabetes:          5.7%-6.4%  Diabetes:              >6.4%  Glycemic control for   <7.0% adults with diabetes    Mean Plasma Glucose 12/12/2022 148.46  mg/dL Final   Performed at [facility], [address] [place], [place] [address]   Glucose-Capillary 12/11/2022 107 (H)  70 - 99 mg/dL Final   Glucose reference range applies only to samples taken after fasting for at least 8 hours.   Glucose-Capillary 12/11/2022 192 (H)  70 - 99 mg/dL Final   Glucose reference range applies only to samples taken after fasting for at least 8 hours.   Glucose-Capillary 12/11/2022 155 (H)  70 - 99 mg/dL Final   Glucose reference range applies only to samples taken after fasting for at least 8 hours.   Glucose-Capillary 12/12/2022 109 (H)  70 - 99 mg/dL Final   Glucose reference range applies only to samples taken after fasting for at least 8 hours.   Glucose-Capillary 12/12/2022 123 (H)  70 - 99 mg/dL Final   Glucose reference range applies only to samples taken after fasting for at least 8  hours.   Glucose-Capillary 12/12/2022 151 (H)  70 - 99 mg/dL Final   Glucose reference range applies only to samples taken after fasting for at least 8 hours.   Glucose-Capillary 12/12/2022  137 (H)  70 - 99 mg/dL Final   Glucose reference range applies only to samples taken after fasting for at least 8 hours.  Admission on 12/10/2022, Discharged on 12/11/2022  Component Date Value Ref Range Status   Sodium 12/10/2022 134 (L)  135 - 145 mmol/L Final   Potassium 12/10/2022 3.6  3.5 - 5.1 mmol/L Final   Chloride 12/10/2022 99  98 - 111 mmol/L Final   CO2 12/10/2022 25  22 - 32 mmol/L Final   Glucose, Bld 12/10/2022 177 (H)  70 - 99 mg/dL Final   Glucose reference range applies only to samples taken after fasting for at least 8 hours.   BUN 12/10/2022 13  6 - 20 mg/dL Final   Creatinine, Ser 12/10/2022 0.78  0.61 - 1.24 mg/dL Final   Calcium 16/02/9603 8.8 (L)  8.9 - 10.3 mg/dL Final   Total Protein 54/01/8118 6.7  6.5 - 8.1 g/dL Final   Albumin 14/78/2956 3.6  3.5 - 5.0 g/dL Final   AST 21/30/8657 33  15 - 41 U/L Final   ALT 12/10/2022 48 (H)  0 - 44 U/L Final   Alkaline Phosphatase 12/10/2022 81  38 - 126 U/L Final   Total Bilirubin 12/10/2022 0.7  0.3 - 1.2 mg/dL Final   GFR, Estimated 12/10/2022 >60  >60 mL/min Final   Comment: (NOTE) Calculated using the CKD-EPI Creatinine Equation (2021)    Anion gap 12/10/2022 10  5 - 15 Final   Performed at [facility], [address] [place], [place] [address]   Alcohol, Ethyl (B) 12/10/2022 <10  <10 mg/dL Final   Comment: (NOTE) Lowest detectable limit for serum alcohol is 10 mg/dL.  For medical purposes only. Performed at [facility], [address] [place], [place] [address]    Opiates 12/10/2022 NONE DETECTED  NONE DETECTED Final   Cocaine 12/10/2022 NONE DETECTED  NONE DETECTED Final   Benzodiazepines 12/10/2022 POSITIVE (A)  NONE DETECTED Final   Amphetamines 12/10/2022 NONE DETECTED  NONE DETECTED Final    Tetrahydrocannabinol 12/10/2022 NONE DETECTED  NONE DETECTED Final   Barbiturates 12/10/2022 POSITIVE (A)  NONE DETECTED Final   Comment: (NOTE) DRUG SCREEN FOR MEDICAL PURPOSES ONLY.  IF CONFIRMATION IS NEEDED FOR ANY PURPOSE, NOTIFY LAB WITHIN 5 DAYS.  LOWEST DETECTABLE LIMITS FOR URINE DRUG SCREEN Drug Class                     Cutoff (ng/mL) Amphetamine and metabolites    1000 Barbiturate and metabolites    200 Benzodiazepine                 200 Opiates and metabolites        300 Cocaine and metabolites        300 THC                            50 Performed at [facility], [address] [place], [place] [address]    WBC 12/10/2022 5.1  4.0 - 10.5 K/uL Final   RBC 12/10/2022 4.11 (L)  4.22 - 5.81 MIL/uL Final   Hemoglobin 12/10/2022 13.0  13.0 - 17.0 g/dL Final   HCT 40/02/2724 37.5 (L)  39.0 - 52.0 % Final   MCV 12/10/2022 91.2  80.0 - 100.0 fL Final   MCH 12/10/2022 31.6  26.0 - 34.0 pg Final   MCHC 12/10/2022 34.7  30.0 - 36.0  g/dL Final   RDW 71/10/2692 13.5  11.5 - 15.5 % Final   Platelets 12/10/2022 249  150 - 400 K/uL Final   nRBC 12/10/2022 0.0  0.0 - 0.2 % Final   Neutrophils Relative % 12/10/2022 50  % Final   Neutro Abs 12/10/2022 2.6  1.7 - 7.7 K/uL Final   Lymphocytes Relative 12/10/2022 31  % Final   Lymphs Abs 12/10/2022 1.6  0.7 - 4.0 K/uL Final   Monocytes Relative 12/10/2022 13  % Final   Monocytes Absolute 12/10/2022 0.7  0.1 - 1.0 K/uL Final   Eosinophils Relative 12/10/2022 5  % Final   Eosinophils Absolute 12/10/2022 0.2  0.0 - 0.5 K/uL Final   Basophils Relative 12/10/2022 1  % Final   Basophils Absolute 12/10/2022 0.1  0.0 - 0.1 K/uL Final   Immature Granulocytes 12/10/2022 0  % Final   Abs Immature Granulocytes 12/10/2022 0.01  0.00 - 0.07 K/uL Final   Performed at [facility], [address], [place], [place] [address]  Admission on 12/10/2022, Discharged on 12/10/2022  Component Date Value Ref Range Status   Sodium 12/10/2022  134 (L)  135 - 145 mmol/L Final   Potassium 12/10/2022 4.0  3.5 - 5.1 mmol/L Final   Chloride 12/10/2022 103  98 - 111 mmol/L Final   CO2 12/10/2022 20 (L)  22 - 32 mmol/L Final   Glucose, Bld 12/10/2022 97  70 - 99 mg/dL Final   Glucose reference range applies only to samples taken after fasting for at least 8 hours.   BUN 12/10/2022 10  6 - 20 mg/dL Final   Creatinine, Ser 12/10/2022 0.67  0.61 - 1.24 mg/dL Final   Calcium 70/35/0093 9.0  8.9 - 10.3 mg/dL Final   Total Protein 81/82/9937 7.4  6.5 - 8.1 g/dL Final   Albumin 16/96/7893 4.0  3.5 - 5.0 g/dL Final   AST 81/05/7508 42 (H)  15 - 41 U/L Final   ALT 12/10/2022 56 (H)  0 - 44 U/L Final   Alkaline Phosphatase 12/10/2022 77  38 - 126 U/L Final   Total Bilirubin 12/10/2022 0.7  0.3 - 1.2 mg/dL Final   GFR, Estimated 12/10/2022 >60  >60 mL/min Final   Comment: (NOTE) Calculated using the CKD-EPI Creatinine Equation (2021)    Anion gap 12/10/2022 11  5 - 15 Final   Performed at [facility], [address] [place], [place] [address]   Alcohol, Ethyl (B) 12/10/2022 <10  <10 mg/dL Final   Comment: (NOTE) Lowest detectable limit for serum alcohol is 10 mg/dL.  For medical purposes only. Performed at [facility], [address], [place], [place] [address]    WBC 12/10/2022 5.3  4.0 - 10.5 K/uL Final   RBC 12/10/2022 4.49  4.22 - 5.81 MIL/uL Final   Hemoglobin 12/10/2022 14.3  13.0 - 17.0 g/dL Final   HCT 23/53/6144 41.3  39.0 - 52.0 % Final   MCV 12/10/2022 92.0  80.0 - 100.0 fL Final   MCH 12/10/2022 31.8  26.0 - 34.0 pg Final   MCHC 12/10/2022 34.6  30.0 - 36.0 g/dL Final   RDW 31/54/0086 13.8  11.5 - 15.5 % Final   Platelets 12/10/2022 251  150 - 400 K/uL Final   nRBC 12/10/2022 0.0  0.0 - 0.2 % Final   Performed at [facility], [address], [place], [place] [address]   Opiates 12/10/2022 NONE DETECTED  NONE DETECTED Final  Cocaine 12/10/2022 NONE DETECTED  NONE DETECTED Final   Benzodiazepines  12/10/2022 POSITIVE (A)  NONE DETECTED Final   Amphetamines 12/10/2022 NONE DETECTED  NONE DETECTED Final   Tetrahydrocannabinol 12/10/2022 NONE DETECTED  NONE DETECTED Final   Barbiturates 12/10/2022 POSITIVE (A)  NONE DETECTED Final   Comment: (NOTE) DRUG SCREEN FOR MEDICAL PURPOSES ONLY.  IF CONFIRMATION IS NEEDED FOR ANY PURPOSE, NOTIFY LAB WITHIN 5 DAYS.  LOWEST DETECTABLE LIMITS FOR URINE DRUG SCREEN Drug Class                     Cutoff (ng/mL) Amphetamine and metabolites    1000 Barbiturate and metabolites    200 Benzodiazepine                 200 Opiates and metabolites        300 Cocaine and metabolites        300 THC                            50 Performed at [facility], [address] [place], [place] [address]   Admission on 12/08/2022, Discharged on 12/09/2022  Component Date Value Ref Range Status   WBC 12/08/2022 6.6  4.0 - 10.5 K/uL Final   RBC 12/08/2022 4.10 (L)  4.22 - 5.81 MIL/uL Final   Hemoglobin 12/08/2022 13.4  13.0 - 17.0 g/dL Final   HCT 84/16/6063 38.2 (L)  39.0 - 52.0 % Final   MCV 12/08/2022 93.2  80.0 - 100.0 fL Final   MCH 12/08/2022 32.7  26.0 - 34.0 pg Final   MCHC 12/08/2022 35.1  30.0 - 36.0 g/dL Final   RDW 01/60/1093 14.6  11.5 - 15.5 % Final   Platelets 12/08/2022 209  150 - 400 K/uL Final   nRBC 12/08/2022 0.0  0.0 - 0.2 % Final   Performed at [facility], [address] [place], [place] [address]   Troponin I (High Sensitivity) 12/08/2022 6  <18 ng/L Final   Comment: (NOTE) Elevated high sensitivity troponin I (hsTnI) values and significant  changes across serial measurements may suggest ACS but many other  chronic and acute conditions are known to elevate hsTnI results.  Refer to the "Links" section for chest pain algorithms and additional  guidance. Performed at [facility], [address], [place], [place] [address]    Sodium 12/08/2022 140  135 - 145 mmol/L Final   Potassium 12/08/2022 3.2 (L)  3.5 -  5.1 mmol/L Final   Chloride 12/08/2022 108  98 - 111 mmol/L Final   CO2 12/08/2022 19 (L)  22 - 32 mmol/L Final   Glucose, Bld 12/08/2022 147 (H)  70 - 99 mg/dL Final   Glucose reference range applies only to samples taken after fasting for at least 8 hours.   BUN 12/08/2022 <5 (L)  6 - 20 mg/dL Final   Creatinine, Ser 12/08/2022 1.22  0.61 - 1.24 mg/dL Final   Calcium 54/27/0623 8.6 (L)  8.9 - 10.3 mg/dL Final   Total Protein 76/28/3151 7.2  6.5 - 8.1 g/dL Final   Albumin 76/16/0737 3.8  3.5 - 5.0 g/dL Final   AST 10/62/6948 40  15 - 41 U/L Final   ALT 12/08/2022 61 (H)  0 - 44 U/L Final   Alkaline Phosphatase 12/08/2022 75  38 - 126 U/L Final   Total Bilirubin 12/08/2022 0.5  0.3 - 1.2 mg/dL Final  GFR, Estimated 12/08/2022 >60  >60 mL/min Final   Comment: (NOTE) Calculated using the CKD-EPI Creatinine Equation (2021)    Anion gap 12/08/2022 13  5 - 15 Final   Performed at [facility], [address], [place], [place] [address]   Lipase 12/08/2022 28  11 - 51 U/L Final   Performed at [facility], [address], [place], [place] [address]   Alcohol, Ethyl (B) 12/08/2022 48 (H)  <10 mg/dL Final   Comment: (NOTE) Lowest detectable limit for serum alcohol is 10 mg/dL.  For medical purposes only. Performed at [facility], [address] [place], [place] [address]    Troponin I (High Sensitivity) 12/08/2022 6  <18 ng/L Final   Comment: (NOTE) Elevated high sensitivity troponin I (hsTnI) values and significant  changes across serial measurements may suggest ACS but many other  chronic and acute conditions are known to elevate hsTnI results.  Refer to the "Links" section for chest pain algorithms and additional  guidance. Performed at [facility], [address], [place], [place] [address]    Total CK 12/08/2022 238  49 - 397 U/L Final   Performed at [facility], [address], [place], [place] [address]   Magnesium 12/08/2022 2.1  1.7 - 2.4  mg/dL Final   Performed at [facility], [address], [place], [place] [address]   Phosphorus 12/08/2022 3.1  2.5 - 4.6 mg/dL Final   Performed at [facility], [address] [place], [place] [address]   Prothrombin Time 12/08/2022 15.0  11.4 - 15.2 seconds Final   INR 12/08/2022 1.2  0.8 - 1.2 Final   Comment: (NOTE) INR goal varies based on device and disease states. Performed at [facility], [address], [place], [place] [address]    Prealbumin 12/09/2022 26  18 - 38 mg/dL Final   Performed at [facility], [address], [place], [place] [address]   Total Protein 12/08/2022 6.0 (L)  6.5 - 8.1 g/dL Final   Albumin 60/02/9322 3.1 (L)  3.5 - 5.0 g/dL Final   AST 55/73/2202 29  15 - 41 U/L Final   ALT 12/08/2022 49 (H)  0 - 44 U/L Final   Alkaline Phosphatase 12/08/2022 58  38 - 126 U/L Final   Total Bilirubin 12/08/2022 0.5  0.3 - 1.2 mg/dL Final   Bilirubin, Direct 12/08/2022 <0.1  0.0 - 0.2 mg/dL Final   Indirect Bilirubin 12/08/2022 NOT CALCULATED  0.3 - 0.9 mg/dL Final   Performed at [facility], [address] [place], [place] [address]   Ammonia 12/08/2022 16  9 - 35 umol/L Final   Performed at [facility], [address] [place], [place] [address]   TSH 12/08/2022 0.172 (L)  0.350 - 4.500 uIU/mL Final   Comment: Performed by a 3rd Generation assay with a functional sensitivity of <=0.01 uIU/mL. Performed at [facility], [address] [place], [place] [address]    Osmolality, Ur 12/08/2022 442  300 - 900 mOsm/kg Final   Performed at [facility], [address], [place], [place] [address]   Creatinine, Urine 12/08/2022 59  mg/dL Final   Performed at [facility], [address], [place], [place] [address]   Sodium, Ur 12/08/2022 123  mmol/L Final   Performed at [facility], [address], [place], [place]  [address]   Sodium 12/08/2022 138  135 - 145 mmol/L Final   Potassium 12/08/2022 3.4 (L)  3.5  - 5.1 mmol/L Final   Chloride 12/08/2022 108  98 - 111 mmol/L Final   CO2 12/08/2022 23  22 - 32 mmol/L Final   Glucose, Bld 12/08/2022 125 (H)  70 - 99 mg/dL Final   Glucose reference range applies only to samples taken after fasting for at least 8 hours.   BUN 12/08/2022 <5 (L)  6 - 20 mg/dL Final   Creatinine, Ser 12/08/2022 1.13  0.61 - 1.24 mg/dL Final   Calcium 40/98/1191 8.3 (L)  8.9 - 10.3 mg/dL Final   GFR, Estimated 12/08/2022 >60  >60 mL/min Final   Comment: (NOTE) Calculated using the CKD-EPI Creatinine Equation (2021)    Anion gap 12/08/2022 7  5 - 15 Final   Performed at [facility], [address] [place], [place] [address]   Osmolality 12/08/2022 320 (H)  275 - 295 mOsm/kg Final   Performed at [facility], [address], [place], [place] [address]   Magnesium 12/09/2022 2.1  1.7 - 2.4 mg/dL Final   Performed at [facility], [address], [place], [place] [address]   Phosphorus 12/09/2022 3.7  2.5 - 4.6 mg/dL Final   Performed at [facility] Lab, [address], [place], [place] [address]   Sodium 12/09/2022 138  135 - 145 mmol/L Final   Potassium 12/09/2022 3.3 (L)  3.5 - 5.1 mmol/L Final   Chloride 12/09/2022 108  98 - 111 mmol/L Final   CO2 12/09/2022 23  22 - 32 mmol/L Final   Glucose, Bld 12/09/2022 132 (H)  70 - 99 mg/dL Final   Glucose reference range applies only to samples taken after fasting for at least 8 hours.   BUN 12/09/2022 <5 (L)  6 - 20 mg/dL Final   Creatinine, Ser 12/09/2022 1.08  0.61 - 1.24 mg/dL Final   Calcium 29/52/8413 8.8 (L)  8.9 - 10.3 mg/dL Final   Total Protein 24/40/1027 6.5  6.5 - 8.1 g/dL Final   Albumin 25/36/6440 3.4 (L)  3.5 - 5.0 g/dL Final   AST 34/74/2595 31  15 - 41 U/L Final   ALT 12/09/2022 49 (H)  0 - 44 U/L Final   Alkaline Phosphatase 12/09/2022 66  38 - 126 U/L Final   Total Bilirubin 12/09/2022 0.5  0.3 - 1.2 mg/dL Final   GFR, Estimated 12/09/2022 >60  >60 mL/min Final   Comment:  (NOTE) Calculated using the CKD-EPI Creatinine Equation (2021)    Anion gap 12/09/2022 7  5 - 15 Final   Performed at [facility] Lab, [address] [place], [place] [address]   WBC 12/09/2022 5.3  4.0 - 10.5 K/uL Final   RBC 12/09/2022 3.69 (L)  4.22 - 5.81 MIL/uL Final   Hemoglobin 12/09/2022 12.1 (L)  13.0 - 17.0 g/dL Final   HCT 64/33/2951 34.6 (L)  39.0 - 52.0 % Final   MCV 12/09/2022 93.8  80.0 - 100.0 fL Final   MCH 12/09/2022 32.8  26.0 - 34.0 pg Final   MCHC 12/09/2022 35.0  30.0 - 36.0 g/dL Final   RDW 88/41/6606 14.6  11.5 - 15.5 % Final   Platelets 12/09/2022 194  150 - 400 K/uL Final   nRBC 12/09/2022 0.0  0.0 - 0.2 % Final   Performed at [facility] Lab, [address], [place], [place] [address]  Weight 12/09/2022 2,688  oz Final   Height 12/09/2022 68  in Final   BP 12/09/2022 133/95  mmHg Final   S' Lateral 12/09/2022 3.00  cm Final   AR max vel 12/09/2022 2.41  cm2 Final   AV Area VTI 12/09/2022 2.28  cm2 Final   AV Mean grad 12/09/2022 4.0  mmHg Final   AV Peak grad 12/09/2022 5.5  mmHg Final   Ao pk vel 12/09/2022 1.17  m/s Final   Area-P 1/2 12/09/2022 3.42  cm2 Final   AV Area mean vel 12/09/2022 2.22  cm2 Final   Est EF 12/09/2022 60 - 65%   Final   Beta-Hydroxybutyric Acid 12/08/2022 0.10  0.05 - 0.27 mmol/L Final   Performed at [facility] Lab, [address] [place], [place] [address]   Glucose-Capillary 12/08/2022 120 (H)  70 - 99 mg/dL Final   Glucose reference range applies only to samples taken after fasting for at least 8 hours.   Cholesterol 12/09/2022 183  0 - 200 mg/dL Final   Triglycerides 60/45/4098 91  <150 mg/dL Final   HDL 11/91/4782 50  >40 mg/dL Final   Total CHOL/HDL Ratio 12/09/2022 3.7  RATIO Final   VLDL 12/09/2022 18  0 - 40 mg/dL Final   LDL Cholesterol 12/09/2022 115 (H)  0 - 99 mg/dL Final   Comment:        Total Cholesterol/HDL:CHD Risk Coronary Heart Disease Risk Table                     Men   Women  1/2  Average Risk   3.4   3.3  Average Risk       5.0   4.4  2 X Average Risk   9.6   7.1  3 X Average Risk  23.4   11.0        Use the calculated Patient Ratio above and the CHD Risk Table to determine the patient's CHD Risk.        ATP III CLASSIFICATION (LDL):  <100     mg/dL   Optimal  956-213  mg/dL   Near or Above                    Optimal  130-159  mg/dL   Borderline  086-578  mg/dL   High  >469     mg/dL   Very High Performed at [facility] Lab, [address] [place], [place] [address]    Lipoprotein (a) 12/09/2022 47.5 (H)  <75.0 nmol/L Final   Comment: (NOTE) Note:  Values greater than or equal to 75.0 nmol/L may       indicate an independent risk factor for CHD,       but must be evaluated with caution when applied       to non-Caucasian populations due to the       influence of genetic factors on Lp(a) across       ethnicities. Performed At: [facility] [address] [place], [place] [id number] Jolene Schimke MD UU:[id number]    Free T4 12/09/2022 0.78  0.61 - 1.12 ng/dL Final   Comment: (NOTE) Biotin ingestion may interfere with free T4 tests. If the results are inconsistent with the TSH level, previous test results, or the clinical presentation, then consider biotin interference. If needed, order repeat testing after stopping biotin. Performed at [facility] Lab, [address], [place], [place] [address]  D-Dimer, Quant 12/09/2022 0.43  0.00 - 0.50 ug/mL-FEU Final   Comment: (NOTE) At the manufacturer cut-off value of 0.5 g/mL FEU, this assay has a negative predictive value of 95-100%.This assay is intended for use in conjunction with a clinical pretest probability (PTP) assessment model to exclude pulmonary embolism (PE) and deep venous thrombosis (DVT) in outpatients suspected of PE or DVT. Results should be correlated with clinical presentation. Performed at [facility] [facility], [address] [place], [place] [address]    T3, Total  12/09/2022 103  71 - 180 ng/dL Final   Comment: (NOTE) Performed At: [facility] [address] [place], [place] [id number] Jolene Schimke MD IH:[id number]    Glucose-Capillary 12/08/2022 108 (H)  70 - 99 mg/dL Final   Glucose reference range applies only to samples taken after fasting for at least 8 hours.   Glucose-Capillary 12/09/2022 122 (H)  70 - 99 mg/dL Final   Glucose reference range applies only to samples taken after fasting for at least 8 hours.   Glucose-Capillary 12/09/2022 115 (H)  70 - 99 mg/dL Final   Glucose reference range applies only to samples taken after fasting for at least 8 hours.   Glucose-Capillary 12/09/2022 148 (H)  70 - 99 mg/dL Final   Glucose reference range applies only to samples taken after fasting for at least 8 hours.   Glucose-Capillary 12/09/2022 109 (H)  70 - 99 mg/dL Final   Glucose reference range applies only to samples taken after fasting for at least 8 hours.  Admission on 12/07/2022, Discharged on 12/08/2022  Component Date Value Ref Range Status   Sodium 12/07/2022 141  135 - 145 mmol/L Final   Potassium 12/07/2022 3.4 (L)  3.5 - 5.1 mmol/L Final   Chloride 12/07/2022 103  98 - 111 mmol/L Final   CO2 12/07/2022 21 (L)  22 - 32 mmol/L Final   Glucose, Bld 12/07/2022 242 (H)  70 - 99 mg/dL Final   Glucose reference range applies only to samples taken after fasting for at least 8 hours.   BUN 12/07/2022 8  6 - 20 mg/dL Final   Creatinine, Ser 12/07/2022 1.80 (H)  0.61 - 1.24 mg/dL Final   DELTA CHECK NOTED   Calcium 12/07/2022 9.1  8.9 - 10.3 mg/dL Final   Total Protein 75/64/3329 7.7  6.5 - 8.1 g/dL Final   Albumin 51/88/4166 4.1  3.5 - 5.0 g/dL Final   AST 11/18/1599 60 (H)  15 - 41 U/L Final   ALT 12/07/2022 81 (H)  0 - 44 U/L Final   Alkaline Phosphatase 12/07/2022 81  38 - 126 U/L Final   Total Bilirubin 12/07/2022 0.6  0.3 - 1.2 mg/dL Final   GFR, Estimated 12/07/2022 43 (L)  >60 mL/min Final   Comment: (NOTE) Calculated  using the CKD-EPI Creatinine Equation (2021)    Anion gap 12/07/2022 17 (H)  5 - 15 Final   Performed at [facility], [address], [place], [place] [address]   Alcohol, Ethyl (B) 12/07/2022 <10  <10 mg/dL Final   Comment: (NOTE) Lowest detectable limit for serum alcohol is 10 mg/dL.  For medical purposes only. Performed at [facility], [address] [place], [place] [address]    Salicylate Lvl 12/07/2022 <7.0 (L)  7.0 - 30.0 mg/dL Final   Performed at [facility], [address], [place], [place] [address]   Acetaminophen (Tylenol), Serum 12/07/2022 <10 (L)  10 - 30 ug/mL Final  Comment: (NOTE) Therapeutic concentrations vary significantly. A range of 10-30 ug/mL  may be an effective concentration for many patients. However, some  are best treated at concentrations outside of this range. Acetaminophen concentrations >150 ug/mL at 4 hours after ingestion  and >50 ug/mL at 12 hours after ingestion are often associated with  toxic reactions.  Performed at [facility], [address] [place], [place] [address]    WBC 12/07/2022 9.7  4.0 - 10.5 K/uL Final   RBC 12/07/2022 4.75  4.22 - 5.81 MIL/uL Final   Hemoglobin 12/07/2022 15.3  13.0 - 17.0 g/dL Final   HCT 29/56/2130 43.0  39.0 - 52.0 % Final   MCV 12/07/2022 90.5  80.0 - 100.0 fL Final   MCH 12/07/2022 32.2  26.0 - 34.0 pg Final   MCHC 12/07/2022 35.6  30.0 - 36.0 g/dL Final   RDW 86/57/8469 14.6  11.5 - 15.5 % Final   Platelets 12/07/2022 213  150 - 400 K/uL Final   nRBC 12/07/2022 0.0  0.0 - 0.2 % Final   Performed at [facility], [address] [place], [place] [address]   Opiates 12/08/2022 NONE DETECTED  NONE DETECTED Final   Cocaine 12/08/2022 NONE DETECTED  NONE DETECTED Final   Benzodiazepines 12/08/2022 POSITIVE (A)  NONE DETECTED Final   Amphetamines 12/08/2022 NONE DETECTED  NONE DETECTED Final   Tetrahydrocannabinol 12/08/2022 NONE DETECTED  NONE DETECTED Final    Barbiturates 12/08/2022 NONE DETECTED  NONE DETECTED Final   Comment: (NOTE) DRUG SCREEN FOR MEDICAL PURPOSES ONLY.  IF CONFIRMATION IS NEEDED FOR ANY PURPOSE, NOTIFY LAB WITHIN 5 DAYS.  LOWEST DETECTABLE LIMITS FOR URINE DRUG SCREEN Drug Class                     Cutoff (ng/mL) Amphetamine and metabolites    1000 Barbiturate and metabolites    200 Benzodiazepine                 200 Opiates and metabolites        300 Cocaine and metabolites        300 THC                            50 Performed at [facility], [address] [place], [place] [address]    Beta-Hydroxybutyric Acid 12/07/2022 0.10  0.05 - 0.27 mmol/L Final   Performed at [facility], [address] [place], [place] [address]   Lactic Acid, Venous 12/07/2022 2.4 (HH)  0.5 - 1.9 mmol/L Final   Comment: CRITICAL RESULT CALLED TO, READ BACK BY AND VERIFIED WITH Birdie Hopes RN @ [phone] 12/07/22 JBUTLER Performed at [facility], [address] [place], [place] [address]    Lactic Acid, Venous 12/08/2022 2.0 (HH)  0.5 - 1.9 mmol/L Final   Comment: CRITICAL VALUE NOTED. VALUE IS CONSISTENT WITH PREVIOUSLY REPORTED/CALLED VALUE Performed at [facility], [address], [place], [place] [address]    Magnesium 12/07/2022 2.5 (H)  1.7 - 2.4 mg/dL Final   Comment: HEMOLYSIS AT THIS LEVEL MAY AFFECT RESULT Performed at [facility], [address], [place], [place] [address]    Sodium 12/08/2022 142  135 - 145 mmol/L Final   Potassium 12/08/2022 3.2 (L)  3.5 - 5.1 mmol/L Final   Chloride 12/08/2022 110  98 - 111 mmol/L Final   CO2 12/08/2022  19 (L)  22 - 32 mmol/L Final   Glucose, Bld 12/08/2022 158 (H)  70 - 99 mg/dL Final   Glucose reference range applies only to samples taken after fasting for at least 8 hours.   BUN 12/08/2022 8  6 - 20 mg/dL Final   Creatinine, Ser 12/08/2022 1.70 (H)  0.61 - 1.24 mg/dL Final   Calcium 40/98/1191 7.9 (L)  8.9 - 10.3 mg/dL Final   GFR, Estimated  12/08/2022 46 (L)  >60 mL/min Final   Comment: (NOTE) Calculated using the CKD-EPI Creatinine Equation (2021)    Anion gap 12/08/2022 13  5 - 15 Final   Performed at [facility], [address], [place], [place] [address]   Osmolality 12/08/2022 345 (HH)  275 - 295 mOsm/kg Final   Comment: REPEATED TO VERIFY CRITICAL RESULT CALLED TO, READ BACK BY AND VERIFIED WITH: GASQUE,S RN 12/08/2022 AT 0238 SKEEN,P Performed at [facility], [address], [place], [place] [address]   No results displayed because visit has over 200 results.    Admission on 09/29/2022, Discharged on 10/01/2022  Component Date Value Ref Range Status   Glucose-Capillary 09/30/2022 238 (H)  70 - 99 mg/dL Final   Glucose reference range applies only to samples taken after fasting for at least 8 hours.   Glucose-Capillary 09/30/2022 159 (H)  70 - 99 mg/dL Final   Glucose reference range applies only to samples taken after fasting for at least 8 hours.   Glucose-Capillary 09/30/2022 191 (H)  70 - 99 mg/dL Final   Glucose reference range applies only to samples taken after fasting for at least 8 hours.   Glucose-Capillary 09/30/2022 275 (H)  70 - 99 mg/dL Final   Glucose reference range applies only to samples taken after fasting for at least 8 hours.   Glucose-Capillary 09/30/2022 247 (H)  70 - 99 mg/dL Final   Glucose reference range applies only to samples taken after fasting for at least 8 hours.   Glucose-Capillary 09/30/2022 236 (H)  70 - 99 mg/dL Final   Glucose reference range applies only to samples taken after fasting for at least 8 hours.   Glucose-Capillary 10/01/2022 156 (H)  70 - 99 mg/dL Final   Glucose reference range applies only to samples taken after fasting for at least 8 hours.   Glucose-Capillary 10/01/2022 186 (H)  70 - 99 mg/dL Final   Glucose reference range applies only to samples taken after fasting for at least 8 hours.   Glucose-Capillary 10/01/2022 139 (H)  70 - 99 mg/dL Final    Glucose reference range applies only to samples taken after fasting for at least 8 hours.  Admission on 09/29/2022, Discharged on 09/29/2022  Component Date Value Ref Range Status   WBC 09/29/2022 5.7  4.0 - 10.5 K/uL Final   RBC 09/29/2022 4.72  4.22 - 5.81 MIL/uL Final   Hemoglobin 09/29/2022 15.1  13.0 - 17.0 g/dL Final   HCT 08/65/7846 43.1  39.0 - 52.0 % Final   MCV 09/29/2022 91.3  80.0 - 100.0 fL Final   MCH 09/29/2022 32.0  26.0 - 34.0 pg Final   MCHC 09/29/2022 35.0  30.0 - 36.0 g/dL Final   RDW 96/29/5284 13.3  11.5 - 15.5 % Final   Platelets 09/29/2022 157  150 - 400 K/uL Final   nRBC 09/29/2022 0.0  0.0 - 0.2 % Final   Performed at [facility], [address], [place], [place] [address]  Neutrophils Relative % 09/29/2022 71  % Final   Neutro Abs 09/29/2022 4.1  1.7 - 7.7 K/uL Final   Lymphocytes Relative 09/29/2022 15  % Final   Lymphs Abs 09/29/2022 0.8  0.7 - 4.0 K/uL Final   Monocytes Relative 09/29/2022 12  % Final   Monocytes Absolute 09/29/2022 0.7  0.1 - 1.0 K/uL Final   Eosinophils Relative 09/29/2022 1  % Final   Eosinophils Absolute 09/29/2022 0.1  0.0 - 0.5 K/uL Final   Basophils Relative 09/29/2022 1  % Final   Basophils Absolute 09/29/2022 0.0  0.0 - 0.1 K/uL Final   Immature Granulocytes 09/29/2022 0  % Final   Abs Immature Granulocytes 09/29/2022 0.02  0.00 - 0.07 K/uL Final   Performed at [facility], [address], [place], [place] [address]   Sodium 09/29/2022 133 (L)  135 - 145 mmol/L Final   Potassium 09/29/2022 3.7  3.5 - 5.1 mmol/L Final   Chloride 09/29/2022 101  98 - 111 mmol/L Final   CO2 09/29/2022 23  22 - 32 mmol/L Final   Glucose, Bld 09/29/2022 206 (H)  70 - 99 mg/dL Final   Glucose reference range applies only to samples taken after fasting for at least 8 hours.   BUN 09/29/2022 11  6 - 20 mg/dL Final   Creatinine, Ser 09/29/2022 0.65  0.61 - 1.24 mg/dL Final   Calcium 65/78/4696 9.2  8.9 - 10.3 mg/dL Final   Total  Protein 09/29/2022 7.0  6.5 - 8.1 g/dL Final   Albumin 29/52/8413 3.7  3.5 - 5.0 g/dL Final   AST 24/40/1027 126 (H)  15 - 41 U/L Final   ALT 09/29/2022 161 (H)  0 - 44 U/L Final   Alkaline Phosphatase 09/29/2022 115  38 - 126 U/L Final   Total Bilirubin 09/29/2022 0.7  0.3 - 1.2 mg/dL Final   GFR, Estimated 09/29/2022 >60  >60 mL/min Final   Comment: (NOTE) Calculated using the CKD-EPI Creatinine Equation (2021)    Anion gap 09/29/2022 9  5 - 15 Final   Performed at [facility], [address] [place], [place] [address]   Alcohol, Ethyl (B) 09/29/2022 <10  <10 mg/dL Final   Comment: (NOTE) Lowest detectable limit for serum alcohol is 10 mg/dL.  For medical purposes only. Performed at [facility], [address], [place], [place] [address]    Magnesium 09/29/2022 2.2  1.7 - 2.4 mg/dL Final   Performed at [facility], [address], [place], [place] [address]   Troponin I (High Sensitivity) 09/29/2022 6  <18 ng/L Final   Comment: (NOTE) Elevated high sensitivity troponin I (hsTnI) values and significant  changes across serial measurements may suggest ACS but many other  chronic and acute conditions are known to elevate hsTnI results.  Refer to the "Links" section for chest pain algorithms and additional  guidance. Performed at [facility], [address] [place], [place] [address]    Troponin I (High Sensitivity) 09/29/2022 5  <18 ng/L Final   Comment: (NOTE) Elevated high sensitivity troponin I (hsTnI) values and significant  changes across serial measurements may suggest ACS but many other  chronic and acute conditions are known to elevate hsTnI results.  Refer to the "Links" section for chest pain algorithms and additional  guidance. Performed at [facility], [address], [place], [place] [address]    Glucose-Capillary 09/29/2022 225 (H)  70 - 99 mg/dL  Final   Glucose reference range applies only to samples taken after fasting for  at least 8 hours.  Admission on 09/27/2022, Discharged on 09/29/2022  Component Date Value Ref Range Status   Glucose-Capillary 09/27/2022 271 (H)  70 - 99 mg/dL Final   Glucose reference range applies only to samples taken after fasting for at least 8 hours.   Glucose-Capillary 09/27/2022 252 (H)  70 - 99 mg/dL Final   Glucose reference range applies only to samples taken after fasting for at least 8 hours.   Glucose-Capillary 09/27/2022 284 (H)  70 - 99 mg/dL Final   Glucose reference range applies only to samples taken after fasting for at least 8 hours.   Glucose-Capillary 09/27/2022 226 (H)  70 - 99 mg/dL Final   Glucose reference range applies only to samples taken after fasting for at least 8 hours.   Glucose-Capillary 09/27/2022 342 (H)  70 - 99 mg/dL Final   Glucose reference range applies only to samples taken after fasting for at least 8 hours.   Glucose-Capillary 09/28/2022 255 (H)  70 - 99 mg/dL Final   Glucose reference range applies only to samples taken after fasting for at least 8 hours.   Glucose-Capillary 09/28/2022 324 (H)  70 - 99 mg/dL Final   Glucose reference range applies only to samples taken after fasting for at least 8 hours.   Glucose-Capillary 09/28/2022 309 (H)  70 - 99 mg/dL Final   Glucose reference range applies only to samples taken after fasting for at least 8 hours.   Glucose-Capillary 09/28/2022 311 (H)  70 - 99 mg/dL Final   Glucose reference range applies only to samples taken after fasting for at least 8 hours.   Glucose-Capillary 09/29/2022 305 (H)  70 - 99 mg/dL Final   Glucose reference range applies only to samples taken after fasting for at least 8 hours.  Admission on 09/26/2022, Discharged on 09/27/2022  Component Date Value Ref Range Status   WBC 09/26/2022 8.1  4.0 - 10.5 K/uL Final   RBC 09/26/2022 4.88  4.22 - 5.81 MIL/uL Final   Hemoglobin 09/26/2022 15.9  13.0 - 17.0 g/dL Final   HCT 16/02/9603 44.4  39.0 - 52.0 % Final   MCV  09/26/2022 91.0  80.0 - 100.0 fL Final   MCH 09/26/2022 32.6  26.0 - 34.0 pg Final   MCHC 09/26/2022 35.8  30.0 - 36.0 g/dL Final   RDW 54/01/8118 13.5  11.5 - 15.5 % Final   Platelets 09/26/2022 150  150 - 400 K/uL Final   nRBC 09/26/2022 0.0  0.0 - 0.2 % Final   Performed at [facility], [address], [place], [place] [address]   Sodium 09/26/2022 131 (L)  135 - 145 mmol/L Final   Potassium 09/26/2022 4.2  3.5 - 5.1 mmol/L Final   Chloride 09/26/2022 98  98 - 111 mmol/L Final   CO2 09/26/2022 18 (L)  22 - 32 mmol/L Final   Glucose, Bld 09/26/2022 296 (H)  70 - 99 mg/dL Final   Glucose reference range applies only to samples taken after fasting for at least 8 hours.   BUN 09/26/2022 18  6 - 20 mg/dL Final   Creatinine, Ser 09/26/2022 0.88  0.61 - 1.24 mg/dL Final   Calcium 95/62/1308 9.9  8.9 - 10.3 mg/dL Final   Total Protein 65/78/4696 7.1  6.5 - 8.1 g/dL Final   Albumin 29/52/8413 3.8  3.5 - 5.0 g/dL Final   AST 24/40/1027  112 (H)  15 - 41 U/L Final   ALT 09/26/2022 172 (H)  0 - 44 U/L Final   Alkaline Phosphatase 09/26/2022 162 (H)  38 - 126 U/L Final   Total Bilirubin 09/26/2022 0.6  0.3 - 1.2 mg/dL Final   GFR, Estimated 09/26/2022 >60  >60 mL/min Final   Comment: (NOTE) Calculated using the CKD-EPI Creatinine Equation (2021)    Anion gap 09/26/2022 15  5 - 15 Final   Performed at [facility], [address], [place], [place] [address]   Glucose-Capillary 09/26/2022 281 (H)  70 - 99 mg/dL Final   Glucose reference range applies only to samples taken after fasting for at least 8 hours.   Alcohol, Ethyl (B) 09/26/2022 <10  <10 mg/dL Final   Comment: (NOTE) Lowest detectable limit for serum alcohol is 10 mg/dL.  For medical purposes only. Performed at [facility], [address], [place], [place] [address]    Color, Urine 09/26/2022 YELLOW  YELLOW Final   APPearance 09/26/2022 CLEAR  CLEAR Final   Specific Gravity, Urine 09/26/2022 1.023  1.005 -  1.030 Final   pH 09/26/2022 5.0  5.0 - 8.0 Final   Glucose, UA 09/26/2022 >=500 (A)  NEGATIVE mg/dL Final   Hgb urine dipstick 09/26/2022 NEGATIVE  NEGATIVE Final   Bilirubin Urine 09/26/2022 NEGATIVE  NEGATIVE Final   Ketones, ur 09/26/2022 NEGATIVE  NEGATIVE mg/dL Final   Protein, ur 63/05/6008 NEGATIVE  NEGATIVE mg/dL Final   Nitrite 93/23/5573 NEGATIVE  NEGATIVE Final   Leukocytes,Ua 09/26/2022 NEGATIVE  NEGATIVE Final   RBC / HPF 09/26/2022 0-5  0 - 5 RBC/hpf Final   WBC, UA 09/26/2022 0-5  0 - 5 WBC/hpf Final   Bacteria, UA 09/26/2022 RARE (A)  NONE SEEN Final   Squamous Epithelial / HPF 09/26/2022 0-5  0 - 5 /HPF Final   Performed at [facility], [address], [place], [place] [address]   Opiates 09/26/2022 NONE DETECTED  NONE DETECTED Final   Cocaine 09/26/2022 NONE DETECTED  NONE DETECTED Final   Benzodiazepines 09/26/2022 POSITIVE (A)  NONE DETECTED Final   Amphetamines 09/26/2022 NONE DETECTED  NONE DETECTED Final   Tetrahydrocannabinol 09/26/2022 NONE DETECTED  NONE DETECTED Final   Barbiturates 09/26/2022 NONE DETECTED  NONE DETECTED Final   Comment: (NOTE) DRUG SCREEN FOR MEDICAL PURPOSES ONLY.  IF CONFIRMATION IS NEEDED FOR ANY PURPOSE, NOTIFY LAB WITHIN 5 DAYS.  LOWEST DETECTABLE LIMITS FOR URINE DRUG SCREEN Drug Class                     Cutoff (ng/mL) Amphetamine and metabolites    1000 Barbiturate and metabolites    200 Benzodiazepine                 200 Opiates and metabolites        300 Cocaine and metabolites        300 THC                            50 Performed at [facility], [address], [place], [place] [address]    pH, Ven 09/26/2022 7.437 (H)  7.25 - 7.43 Final   pCO2, Ven 09/26/2022 31.6 (L)  44 - 60 mmHg Final   pO2, Ven 09/26/2022 87 (H)  32 - 45 mmHg Final   Bicarbonate 09/26/2022 21.3  20.0 - 28.0 mmol/L Final   TCO2  09/26/2022 22  22 - 32 mmol/L Final   O2 Saturation 09/26/2022 97  % Final   Acid-base deficit 09/26/2022  2.0  0.0 - 2.0 mmol/L Final   Sodium 09/26/2022 133 (L)  135 - 145 mmol/L Final   Potassium 09/26/2022 3.6  3.5 - 5.1 mmol/L Final   Calcium, Ion 09/26/2022 1.19  1.15 - 1.40 mmol/L Final   HCT 09/26/2022 42.0  39.0 - 52.0 % Final   Hemoglobin 09/26/2022 14.3  13.0 - 17.0 g/dL Final   Sample type 16/02/9603 VENOUS   Final   Glucose-Capillary 09/26/2022 294 (H)  70 - 99 mg/dL Final   Glucose reference range applies only to samples taken after fasting for at least 8 hours.  There may be more visits with results that are not included.    Blood Alcohol level:  Lab Results  Component Value Date   ETH <10 12/10/2022   ETH <10 12/10/2022    Metabolic Disorder Labs: Lab Results  Component Value Date   HGBA1C 6.8 (H) 12/12/2022   MPG 148.46 12/12/2022   MPG 263.26 09/25/2022   No results found for: "PROLACTIN" Lab Results  Component Value Date   CHOL 183 12/09/2022   TRIG 91 12/09/2022   HDL 50 12/09/2022   CHOLHDL 3.7 12/09/2022   VLDL 18 12/09/2022   LDLCALC 115 (H) 12/09/2022   LDLCALC 60 09/25/2022    Therapeutic Lab Levels: No results found for: "LITHIUM" No results found for: "VALPROATE" No results found for: "CBMZ"  Physical Findings   PHQ2-9    Flowsheet Row ED from 12/11/2022 in [facility] ED from 09/29/2022 in [facility] ED from 09/27/2022 in [facility] ED from 09/25/2022 in [facility] Office Visit from 01/15/2022 in [facility] Primary Care at [facility]  PHQ-2 Total Score 1 0 0 2 0  PHQ-9 Total Score -- 4 -- -- --      Flowsheet Row ED from 12/11/2022 in [facility] Most recent reading at 12/11/2022  4:57 AM ED from 12/10/2022 in [facility] Emergency Department at [facility] Most recent reading at 12/10/2022  6:38 PM ED from 12/10/2022 in [facility] Emergency Department at [facility] Most recent  reading at 12/10/2022  3:52 PM  C-SSRS RISK CATEGORY No Risk No Risk No Risk        Musculoskeletal  Strength & Muscle Tone: within normal limits Gait & Station: normal Patient leans: Front  Psychiatric Specialty Exam  Presentation  General Appearance:  Appropriate for Environment; Casual  Eye Contact: Good  Speech: Slow  Speech Volume: Decreased  Handedness: Right   Mood and Affect  Mood: Euthymic  Affect: Congruent; Appropriate   Thought Process  Thought Processes: Coherent; Goal Directed; Linear  Descriptions of Associations:Intact  Orientation:Full (Time, Place and Person)  Thought Content:WDL  Diagnosis of Schizophrenia or Schizoaffective disorder in past: No    Hallucinations:Hallucinations: None  Ideas of Reference:None  Suicidal Thoughts:Suicidal Thoughts: No  Homicidal Thoughts:Homicidal Thoughts: No   Sensorium  Memory: Immediate Good; Recent Good; Remote Good  Judgment: Fair  Insight: Fair   Chartered certified accountant: Fair  Attention Span: Fair  Recall: [employer] of Knowledge: Fair  Language: Fair   Psychomotor Activity  Psychomotor Activity: Normal   Agricultural engineer; Desire for Improvement; Housing; Social Support   Sleep  Sleep: Good   No data recorded   Physical Exam  Physical Exam Vitals and nursing note reviewed.  Constitutional:      General: He is not in acute distress.    Appearance: He is not diaphoretic.  HENT:     Head: Normocephalic and atraumatic.  Eyes:     Conjunctiva/sclera: Conjunctivae normal.  Pulmonary:     Effort: Pulmonary effort is normal.  Musculoskeletal:     Cervical back: Normal range of motion.  Neurological:     Mental Status: He is alert and oriented to person, place, and time.    Review of Systems  Constitutional:  Negative for diaphoresis.  Eyes: Negative.   Respiratory:  Negative for shortness of breath.   Cardiovascular:  Negative  for chest pain.  Gastrointestinal: Negative.   Genitourinary: Negative.   Skin:  Negative for itching and rash.  Neurological:  Negative for dizziness, tingling, tremors and headaches.  Psychiatric/Behavioral:  Positive for substance abuse. Negative for hallucinations and suicidal ideas. The patient is not nervous/anxious.    Blood pressure 116/86, pulse 71, temperature 98.1 F (36.7 C), temperature source Oral, resp. rate 18, SpO2 98%. There is no height or weight on file to calculate BMI.  Treatment Plan Summary: Daily contact with patient to assess and evaluate symptoms and progress in treatment  #Alcohol use disorder: #History of agitation:  This is a 57 year old male with a past medical history of type 2 diabetes and a past psychiatric history of alcohol use disorder, severe who is here for alcohol detox and referral to residential substance use treatment.  Patient appeared somewhat tired, but in relatively good spirits today.  Was ready to eat some breakfast.  CIWAs remain low, he does not appear to be undergoing acute withdrawal and, in fact, has not appeared this way throughout the entirety of his stay.  On day 2 of 4 Ativan taper.  Will reinterview later in afternoon with interpreter if time permits with the goal of perhaps starting an SSRI before discharge.  Collateral interview with son indicates presence of substance-induced mood disorder versus adjustment disorder.  Hopefully we can find safe disposition for him soon, but he otherwise has no new problems.  Patient has been told multiple times that he can come to [name] with any concerns that he has.  Denies SI, HI, and AVH.  #Adjustment disorder, severe #Substance-induced mood disorder, depressive type - Will explore extent of depression in the setting of sobriety, and act accordingly. -Consider starting SSRI.  Could also adjunct with low-dose lithium to 150 mg in the setting of normal kidney function.  CIWA Ativan protocol  initiated: -lorazepam (Ativan) 1 mg 4 times daily x4 doses, 1 mg 3 times daily x3 doses, 1 mg 2 times daily x2 doses, 1 mg daily x1 dose -lorazepam (Ativan) 1 mg every 6 hours as needed for CIWA greater than 10; Hydroxyzine 25 mg for CIWA less than 10 -Multivitamin with minerals 1 tablet daily -Ondansetron disintegrating tablet 4 mg every 6 as needed/nausea or vomiting -Loperamide 2 to 4 mg oral as needed/diarrhea or loose stools -Thiamine injection 100 mg IM once -Thiamine tablet 500 mg Q8 hours  Agitation PRNs as follows: - Olanzapine Zydis 10 mg sublingual every 8 hours as needed for agitation. - Ativan 1 mg p.o. as needed for anxiety/severe agitation. - Ziprasidone 20 mg IM as needed for agitation.  Chronic medical conditions Hypertension and Hyperlipidemia - will continue to hold statin and aspirin at this time. Diabetes Mellitus Type 2 - Will switch from insulin aspartate to metformin 1500 nightly (home dose).  While home med Rx says he should take them in the morning, patient says he is regularly taking it at night. Will consider insulin regimen if glucose blood levels regularly greater than 150. -Hypoglycemia protocol -A1c ordered  Tomie China, MD, PGY-1

## 2022-12-13 NOTE — ED Notes (Signed)
Patient alert and oriented x 3. Denies SI/HI/AVH. Denies intent or plan to harm self or others. Routine conducted according to faculty protocol. Encourage patient to notify staff with any needs or concerns. Patient verbalized agreement and understanding. Will continue to monitor for safety. 

## 2022-12-13 NOTE — Care Management (Signed)
Care Management   Writer contacted [facility] and they reported that the interpreter was not able to meet with the patient at 10am due to an influx of patients coming to [facility].    Writer met with the patient and he reports that he no longer wants to go to a 30-day facility.    Patient reports that he needs to return to work and he misses his family.

## 2022-12-13 NOTE — ED Notes (Signed)
Pt is in the bed resting. Respirations are even and unlabored. No acute distress noted. Will continue to monitor for safety

## 2022-12-13 NOTE — ED Notes (Signed)
Patient is watching tv in the dayroom. Environment secured. Alert and oriented. Will continue to monitor for safety.

## 2022-12-13 NOTE — Group Note (Signed)
Group Topic: Recovery Basics  Group Date: 12/13/2022 Start Time: 0900 End Time: 1000 Facilitators: Candis Schatz, NT  Department: [facility]  Number of Participants: 7  Group Focus: daily focus Treatment Modality:  Spiritual Interventions utilized were support Purpose: increase insight  Name: Derek Cruz Date of Birth: 1966/02/21  MR: [id number]    Level of Participation: alert and receptive Quality of Participation: engaged Interactions with others: receptive Mood/Affect: positive Triggers (if applicable): none noted Cognition: insightful Progress: Gaining insight Response: good Plan: patient will be encouraged to continue 12 step support  Patients Problems:  Patient Active Problem List   Diagnosis Date Noted   Alcohol withdrawal (HCC) 12/08/2022   Abnormal TSH 12/08/2022   Hypokalemia 12/08/2022   CKD (chronic kidney disease), stage III (HCC) 12/08/2022   Alcohol use disorder 12/01/2022   Frequent falls 12/01/2022   Elevated liver enzymes    Alcohol abuse    Liver failure (HCC) 05/07/2020   Alcoholic hepatitis    Hyponatremia    Polycythemia    Hemoglobin A1c less than 7.0% 07/16/2019   Neck pain 03/31/2019   Muscle strain 03/31/2019   Undifferentiated abdominal pain 03/31/2019   Gastroesophageal reflux disease without esophagitis 11/24/2018   Chest discomfort 11/24/2018   Essential hypertension 11/24/2018   Pre-diabetes 11/24/2018   Hyperlipidemia 10/06/2016   Chest pain 09/26/2016   Uncontrolled type 2 diabetes mellitus with hyperglycemia, without long-term current use of insulin (HCC) 09/26/2016

## 2022-12-13 NOTE — ED Notes (Signed)
Patient is sleeping. Respirations equal and unlabored, skin warm and dry. No change in assessment or acuity. Routine safety checks conducted according to facility protocol. Will continue to monitor for safety.   

## 2022-12-14 DIAGNOSIS — F1010 Alcohol abuse, uncomplicated: Secondary | ICD-10-CM | POA: Diagnosis not present

## 2022-12-14 DIAGNOSIS — E119 Type 2 diabetes mellitus without complications: Secondary | ICD-10-CM | POA: Diagnosis not present

## 2022-12-14 DIAGNOSIS — I10 Essential (primary) hypertension: Secondary | ICD-10-CM | POA: Diagnosis not present

## 2022-12-14 DIAGNOSIS — E785 Hyperlipidemia, unspecified: Secondary | ICD-10-CM | POA: Diagnosis not present

## 2022-12-14 DIAGNOSIS — E1165 Type 2 diabetes mellitus with hyperglycemia: Secondary | ICD-10-CM | POA: Diagnosis not present

## 2022-12-14 LAB — GLUCOSE, CAPILLARY
Glucose-Capillary: 104 mg/dL — ABNORMAL HIGH (ref 70–99)
Glucose-Capillary: 103 mg/dL — ABNORMAL HIGH (ref 70–99)

## 2022-12-14 NOTE — Group Note (Signed)
Group Topic: Balance in Life  Group Date: 12/14/2022 Start Time: 1009 End Time: 1100 Facilitators: Priscille Kluver, NT  Department: [facility]  Number of Participants: 6  Group Focus: feeling awareness/expression Treatment Modality:  Skills Training Interventions utilized were support Purpose: enhance coping skills  Name: Kaliel Porcella Date of Birth: 08-10-1965  MR: [id number]    Level of Participation: active Quality of Participation: cooperative Interactions with others: gave feedback Mood/Affect: appropriate Triggers (if applicable):  Cognition: insightful Progress: Gaining insight Response:  Plan: referral / recommendations  Patients Problems:  Patient Active Problem List   Diagnosis Date Noted   Alcohol withdrawal (HCC) 12/08/2022   Abnormal TSH 12/08/2022   Hypokalemia 12/08/2022   CKD (chronic kidney disease), stage III (HCC) 12/08/2022   Alcohol use disorder 12/01/2022   Frequent falls 12/01/2022   Elevated liver enzymes    Alcohol abuse    Liver failure (HCC) 05/07/2020   Alcoholic hepatitis    Hyponatremia    Polycythemia    Hemoglobin A1c less than 7.0% 07/16/2019   Neck pain 03/31/2019   Muscle strain 03/31/2019   Undifferentiated abdominal pain 03/31/2019   Gastroesophageal reflux disease without esophagitis 11/24/2018   Chest discomfort 11/24/2018   Essential hypertension 11/24/2018   Pre-diabetes 11/24/2018   Hyperlipidemia 10/06/2016   Chest pain 09/26/2016   Uncontrolled type 2 diabetes mellitus with hyperglycemia, without long-term current use of insulin (HCC) 09/26/2016

## 2022-12-14 NOTE — ED Notes (Signed)
Patient A&O x 4, ambulatory. Patient discharged in no acute distress. Patient denied SI/HI, A/VH upon discharge. Patient verbalized understanding of all discharge instructions explained by staff, to include follow up appointments, RX's and safety plan. Patient reported mood 10/10.  Pt belongings returned to patient from locker # [id number]. Patient escorted to lobby via staff for transport to destination. Safety maintained.

## 2022-12-14 NOTE — ED Provider Notes (Signed)
FBC/OBS ASAP Discharge Summary  Date and Time: 12/14/2022 4:25 PM  Name: Derek Cruz  MRN:  [id number]   Discharge Diagnoses:  Final diagnoses:  Alcohol abuse  Ineffective coping    Subjective: Vitals within normal limits.  No acute events overnight.  Glucoses: 190, 108, 181.  No refusals.  No PRNs.  CIWAs: 0, 0, 0.  On day 3 of 4 Ativan taper.  On interview with remote interpreter, patient said that he no longer is seeking inpatient treatment because "I have bills to pay."  Patient was told that he was going to [place] for inpatient treatment, when he thought about his financial obligations to his family: "I will be able to pay my rent or keep the lights on."  He also says that he misses his family and has been speaking with his son.  Patient said that he would be amenable to following up with Spanish-speaking alcoholic's Anonymous as well as medication management.  Patient was afraid at first of taking medication for alcohol, writer explained the differences between medications like naltrexone which reduce alcohol cravings and medications like disulfiram which deter alcohol use by making ingestion toxic.  Denied access to guns at the house.  Denied other safety concerns.  Patient said son was comfortable picking him up.  On collateral call with Kathee Delton, son repeated that father had no access to firearms.  No acute safety concerns.  Son is worried that father will return to drinking.  Will attempt to keep an eye on him.  Agreed to to pick him up later in the day.  Stay Summary: Patient stay was almost entirely unremarkable.  CIWAs 0 throughout stay.  Patient was put on agitation protocol in light of previous agitation on withdrawal, was unnecessary at this time.  Ativan taper ended day 3 without incident.  Patient was bright, pleasant and attentive to interview.  On further investigation, collateral, it appears that patient substance use is related to loss of marital status.   Patient was amenable to discharge to inpatient treatment, his information was faxed out to several facilities. However, he requested discharge morning of 7/26 in light of financial worries that his bills would go unpaid.  He repeatedly said that he was determined to abstain from alcohol. patient expressed interest in starting naltrexone therapy.  Gave patient info of Spanish-speaking Alcoholic's Anonymous meetings in [place].  Instructed patient to present to [facility] Monday morning for a walk-in medication management upstairs to begin naltrexone therapy.  Patient denied SI, HI, and AVH throughout stay.  Repeatedly denied suicidal ideation.  No new concerns before discharge.  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 Lives in [place] with his wife and 3 kids; son - 7 years old, daughter - 41 years old, daughter 40 years old. He denies having any legal issues related to his drinking.   Current Medications:  Current Facility-Administered Medications  Medication Dose Route Frequency Provider Last Rate Last Admin   acetaminophen (TYLENOL) tablet 650 mg  650 mg Oral Q6H PRN [name], NP       alum & mag hydroxide-simeth (MAALOX/MYLANTA) 200-200-20 MG/5ML suspension 30 mL  30 mL Oral Q4H PRN [name], NP       OLANZapine zydis (ZYPREXA)  disintegrating tablet 10 mg  10 mg Oral Q8H PRN [name], NP       And   LORazepam (ATIVAN) tablet 1 mg  1 mg Oral PRN [name], NP       And   ziprasidone (GEODON) injection 20 mg  20 mg Intramuscular PRN [name], NP       Melene Muller ON 12/15/2022] LORazepam (ATIVAN) tablet 1 mg  1 mg Oral Daily [name], NP       magnesium hydroxide (MILK OF MAGNESIA) suspension 30 mL  30 mL Oral Daily PRN  [name], NP       metFORMIN (GLUCOPHAGE-XR) 24 hr tablet 1,500 mg  1,500 mg Oral QHS Tomie China, MD   1,500 mg at 12/13/22 2128   multivitamin with minerals tablet 1 tablet  1 tablet Oral Daily [name], NP   1 tablet at 12/14/22 5409   thiamine (VITAMIN B1) tablet 100 mg  100 mg Oral Daily [name], NP   100 mg at 12/14/22 8119   Current Outpatient Medications  Medication Sig Dispense Refill   folic acid (FOLVITE) 1 MG tablet Take 1 tablet (1 mg total) by mouth daily. 30 tablet 0   metFORMIN (GLUCOPHAGE-XR) 750 MG 24 hr tablet Take 2 tablets (1,500 mg total) by mouth daily with breakfast. (Patient not taking: Reported on 12/08/2022) 60 tablet 0   Multiple Vitamin (MULTIVITAMIN WITH MINERALS) TABS tablet Take 1 tablet by mouth daily. 130 tablet 0   thiamine (VITAMIN B1) 100 MG tablet Take 1 tablet (100 mg total) by mouth daily. 30 tablet 0    PTA Medications:  PTA Medications  Medication Sig   metFORMIN (GLUCOPHAGE-XR) 750 MG 24 hr tablet Take 2 tablets (1,500 mg total) by mouth daily with breakfast. (Patient not taking: Reported on 12/08/2022)   thiamine (VITAMIN B1) 100 MG tablet Take 1 tablet (100 mg total) by mouth daily.   Multiple Vitamin (MULTIVITAMIN WITH MINERALS) TABS tablet Take 1 tablet by mouth daily.   folic acid (FOLVITE) 1 MG tablet Take 1 tablet (1 mg total) by mouth daily.   Facility Ordered Medications  Medication   acetaminophen (TYLENOL) tablet 650 mg   alum & mag hydroxide-simeth (MAALOX/MYLANTA) 200-200-20 MG/5ML suspension 30 mL   magnesium hydroxide (MILK OF MAGNESIA) suspension 30 mL   [COMPLETED] thiamine (VITAMIN B1) injection 100 mg   thiamine (VITAMIN B1) tablet 100 mg   multivitamin with minerals tablet 1 tablet   [EXPIRED] LORazepam (ATIVAN) tablet 1 mg   [EXPIRED] hydrOXYzine (ATARAX) tablet 25 mg   [EXPIRED] loperamide (IMODIUM) capsule 2-4 mg   [EXPIRED] ondansetron (ZOFRAN-ODT) disintegrating tablet 4 mg   OLANZapine zydis  (ZYPREXA) disintegrating tablet 10 mg   And   LORazepam (ATIVAN) tablet 1 mg   And   ziprasidone (GEODON) injection 20 mg   [COMPLETED] LORazepam (ATIVAN) tablet 1 mg   Followed by   [COMPLETED] LORazepam (ATIVAN) tablet 1 mg   Followed by   [COMPLETED] LORazepam (ATIVAN) tablet 1 mg   Followed by   Melene Muller ON 12/15/2022] LORazepam (ATIVAN) tablet 1 mg   metFORMIN (GLUCOPHAGE-XR) 24 hr tablet 1,500 mg       12/14/2022   11:51 AM 12/11/2022    3:56 AM 10/01/2022    2:44 PM  Depression screen PHQ 2/9  Decreased Interest 0 1 0  Down, Depressed, Hopeless 0 0 0  PHQ - 2 Score 0 1 0  Feeling bad or failure about yourself  0 2  Flowsheet Row ED from 12/11/2022 in [facility] Most recent reading at 12/11/2022  4:57 AM ED from 12/10/2022 in [facility] Department at [facility] Most recent reading at 12/10/2022  6:38 PM ED from 12/10/2022 in [facility] Emergency Department at [facility] Most recent reading at 12/10/2022  3:52 PM  C-SSRS RISK CATEGORY No Risk No Risk No Risk       Musculoskeletal  Strength & Muscle Tone: within normal limits Gait & Station: normal Patient leans: N/A  Psychiatric Specialty Exam  Presentation  General Appearance:  Casual  Eye Contact: Good  Speech: Clear and Coherent  Speech Volume: Normal  Handedness: Right   Mood and Affect  Mood: Euthymic  Affect: Congruent; Appropriate   Thought Process  Thought Processes: Coherent; Goal Directed; Linear  Descriptions of Associations:Intact  Orientation:Full (Time, Place and Person)  Thought Content:WDL  Diagnosis of Schizophrenia or Schizoaffective disorder in past: No    Hallucinations:Hallucinations: None  Ideas of Reference:None  Suicidal Thoughts:Suicidal Thoughts: No  Homicidal Thoughts:Homicidal Thoughts: No   Sensorium  Memory: Immediate Good; Recent Good; Remote  Good  Judgment: Fair  Insight: Fair   Chartered certified accountant: Fair  Attention Span: Fair  Recall: [employer] of Knowledge: Fair  Language: Fair   Psychomotor Activity  Psychomotor Activity: Psychomotor Activity: Normal   Assets  Assets: Communication Skills; Desire for Improvement; Housing; Social Support   Sleep  Sleep: Sleep: Good   No data recorded  Physical Exam  Physical Exam Constitutional:      General: He is not in acute distress.    Appearance: He is obese.  HENT:     Head: Normocephalic and atraumatic.  Eyes:     Extraocular Movements: Extraocular movements intact.  Pulmonary:     Effort: Pulmonary effort is normal. No respiratory distress.  Abdominal:     Palpations: Abdomen is soft.  Musculoskeletal:        General: Normal range of motion.     Cervical back: Normal range of motion.  Skin:    General: Skin is warm and dry.  Neurological:     Mental Status: He is alert and oriented to person, place, and time. Mental status is at baseline.  Psychiatric:        Mood and Affect: Mood normal.        Behavior: Behavior normal.        Thought Content: Thought content normal.    Review of Systems  Psychiatric/Behavioral:  Negative for depression and suicidal ideas.    Blood pressure 110/72, pulse 72, temperature 97.8 F (36.6 C), resp. rate 16, SpO2 99%. There is no height or weight on file to calculate BMI.  Demographic Factors:  Male, Divorced or widowed, Low socioeconomic status, and Living alone  Loss Factors: Decrease in vocational status and Loss of significant relationship  Historical Factors: Impulsivity  Risk Reduction Factors:   Responsible for children under 67 years of age, Sense of responsibility to family, Religious beliefs about death, Employed, Positive social support, Positive therapeutic relationship, and Positive coping skills or problem solving skills  Continued Clinical Symptoms:   Alcohol/Substance Abuse/Dependencies  Cognitive Features That Contribute To Risk:  None    Suicide Risk:  Mild:  Suicidal ideation of limited frequency, intensity, duration, and specificity.  There are no identifiable plans, no associated intent, mild dysphoria and related symptoms, good self-control (both objective and subjective assessment), few other risk factors, and identifiable protective factors, including available  and accessible social support.  Plan Of Care/Follow-up recommendations:  Follow-up recommendations:  Activity:  Normal, as tolerated Diet:  Per PCP recommendation  Patient is instructed prior to discharge to: Take all medications as prescribed by her mental healthcare provider. Report any adverse effects and/or reactions from the medicines to her outpatient provider promptly. Patient has been instructed & cautioned: To not engage in alcohol and or illegal drug use while on prescription medicines.  In the event of worsening symptoms, patient is instructed to call the crisis hotline at 988, 911 and or go to the nearest ED for appropriate evaluation and treatment of symptoms. To follow-up with her primary care provider for your other medical issues, concerns and or health care needs.   Disposition: Home  Tomie China, MD 12/14/2022, 4:25 PM

## 2022-12-14 NOTE — ED Notes (Signed)
Patient states ride will not be here til 12

## 2022-12-14 NOTE — ED Notes (Signed)
Patient  sleeping in no acute stress. RR even and unlabored .Environment secured .Will continue to monitor for safely. 

## 2022-12-14 NOTE — ED Notes (Signed)
Patient alert and oriented x 3. Denies SI/HI/AVH. Denies intent or plan to harm self or others. Routine conducted according to faculty protocol. Encourage patient to notify staff with any needs or concerns. Patient verbalized agreement and understanding. Will continue to monitor for safety. 

## 2022-12-14 NOTE — ED Notes (Signed)
Patient is sleeping. Respirations equal and unlabored, skin warm and dry. No change in assessment or acuity. Routine safety checks conducted according to facility protocol. Will continue to monitor for safety.   

## 2022-12-14 NOTE — Discharge Instructions (Addendum)
Go to the 2nd floor of the [facility] [address] [place], [place] [address] at 7:00 a.m. 12/17/22. You will be a "walk-in" visit. Please ask to be put on the medication called "Naltrexone" for alcohol cravings. If there are any questions, they can contact Dr. Tomie China.

## 2022-12-20 DIAGNOSIS — F1020 Alcohol dependence, uncomplicated: Secondary | ICD-10-CM | POA: Insufficient documentation

## 2023-02-01 ENCOUNTER — Emergency Department (HOSPITAL_COMMUNITY): Payer: Self-pay

## 2023-02-01 ENCOUNTER — Emergency Department (HOSPITAL_COMMUNITY)
Admission: EM | Admit: 2023-02-01 | Discharge: 2023-02-01 | Disposition: A | Discharge status: Home / Self Care | Payer: Self-pay | Attending: Emergency Medicine | Admitting: Emergency Medicine

## 2023-02-01 ENCOUNTER — Encounter (HOSPITAL_COMMUNITY): Payer: Self-pay

## 2023-02-01 ENCOUNTER — Other Ambulatory Visit (HOSPITAL_COMMUNITY): Payer: Self-pay

## 2023-02-01 ENCOUNTER — Other Ambulatory Visit: Payer: Self-pay

## 2023-02-01 DIAGNOSIS — F1010 Alcohol abuse, uncomplicated: Secondary | ICD-10-CM | POA: Insufficient documentation

## 2023-02-01 DIAGNOSIS — R1013 Epigastric pain: Secondary | ICD-10-CM | POA: Insufficient documentation

## 2023-02-01 DIAGNOSIS — E119 Type 2 diabetes mellitus without complications: Secondary | ICD-10-CM | POA: Insufficient documentation

## 2023-02-01 DIAGNOSIS — R079 Chest pain, unspecified: Secondary | ICD-10-CM | POA: Insufficient documentation

## 2023-02-01 DIAGNOSIS — Z7984 Long term (current) use of oral hypoglycemic drugs: Secondary | ICD-10-CM | POA: Insufficient documentation

## 2023-02-01 DIAGNOSIS — R519 Headache, unspecified: Secondary | ICD-10-CM | POA: Insufficient documentation

## 2023-02-01 LAB — CBC
HCT: 44.9 % (ref 39.0–52.0)
Hemoglobin: 15.9 g/dL (ref 13.0–17.0)
MCH: 31.2 pg (ref 26.0–34.0)
MCHC: 35.4 g/dL (ref 30.0–36.0)
MCV: 88 fL (ref 80.0–100.0)
Platelets: 311 10*3/uL (ref 150–400)
RBC: 5.1 MIL/uL (ref 4.22–5.81)
RDW: 13.4 % (ref 11.5–15.5)
WBC: 5.2 10*3/uL (ref 4.0–10.5)
nRBC: 0 % (ref 0.0–0.2)

## 2023-02-01 LAB — BASIC METABOLIC PANEL
Anion gap: 19 — ABNORMAL HIGH (ref 5–15)
BUN: <5 mg/dL — ABNORMAL LOW (ref 6–20)
CO2: 21 mmol/L — ABNORMAL LOW (ref 22–32)
Calcium: 8.4 mg/dL — ABNORMAL LOW (ref 8.9–10.3)
Chloride: 95 mmol/L — ABNORMAL LOW (ref 98–111)
Creatinine, Ser: 0.61 mg/dL (ref 0.61–1.24)
GFR, Estimated: >60 mL/min (ref >60)
Glucose, Bld: 133 mg/dL — ABNORMAL HIGH (ref 70–99)
Potassium: 3.3 mmol/L — ABNORMAL LOW (ref 3.5–5.1)
Sodium: 135 mmol/L (ref 135–145)

## 2023-02-01 LAB — LIPASE, BLOOD: Lipase: 27 U/L (ref 11–51)

## 2023-02-01 LAB — TROPONIN I (HIGH SENSITIVITY): Troponin I (High Sensitivity): 6 ng/L (ref <18)

## 2023-02-01 MED ORDER — ACETAMINOPHEN 500 MG PO TABS
500.0000 mg | ORAL_TABLET | ORAL | Status: AC
  Administered 2023-02-01: 500 mg via ORAL
  Filled 2023-02-01: qty 1

## 2023-02-01 MED ORDER — LIDOCAINE VISCOUS HCL 2 % MT SOLN
15.0000 mL | Freq: Once | OROMUCOSAL | Status: AC
  Administered 2023-02-01: 15 mL via ORAL
  Filled 2023-02-01: qty 15

## 2023-02-01 MED ORDER — ONDANSETRON 4 MG PO TBDP
4.0000 mg | ORAL_TABLET | Freq: Three times a day (TID) | ORAL | 0 refills | Status: DC | PRN
  Filled 2023-02-01: qty 12, 4d supply, fill #0

## 2023-02-01 MED ORDER — ONDANSETRON HCL 4 MG/2ML IJ SOLN
4.0000 mg | Freq: Once | INTRAMUSCULAR | Status: AC
  Administered 2023-02-01: 4 mg via INTRAVENOUS
  Filled 2023-02-01: qty 2

## 2023-02-01 MED ORDER — CHLORDIAZEPOXIDE HCL 25 MG PO CAPS
ORAL_CAPSULE | ORAL | 0 refills | Status: DC
Start: 2023-02-01 — End: 2023-12-24
  Filled 2023-02-01: qty 10, 3d supply, fill #0

## 2023-02-01 MED ORDER — FAMOTIDINE IN NACL 20-0.9 MG/50ML-% IV SOLN
20.0000 mg | Freq: Once | INTRAVENOUS | Status: AC
  Administered 2023-02-01: 20 mg via INTRAVENOUS
  Filled 2023-02-01: qty 50

## 2023-02-01 MED ORDER — ALUM & MAG HYDROXIDE-SIMETH 200-200-20 MG/5ML PO SUSP
30.0000 mL | Freq: Once | ORAL | Status: AC
  Administered 2023-02-01: 30 mL via ORAL
  Filled 2023-02-01: qty 30

## 2023-02-01 NOTE — ED Triage Notes (Signed)
Patient BIB [facility] from home due to headache. Patient reports constant headache since yesterday with a rating of 2. Upon getting to room patient states he has chest pain 10/10. Patient states last drink of alcohol was at 5pm. Patient denies LOC or vision changes. Patient is alert and oriented x4.

## 2023-02-01 NOTE — ED Provider Notes (Signed)
Derek Cruz EMERGENCY DEPARTMENT AT [facility] Provider Note   CSN: [id number] Arrival date & time: 02/01/23  5643     History  Chief Complaint  Patient presents with   Headache   Chest Pain    Derek Cruz is a 57 y.o. male.  57 yo M spanish speaking only with a history of alcohol abuse and diabetes.   Yesterday he drank too much. Says that they were 24 ounce beers and he drank 6 of them. Typically drinks several times per week. Last drink was yesterday but does not know the time. Has been vomiting today 5-6 times. No bloody vomit. Some chest pain that is on the left side. Started last night. Says that this happens sometimes when he is drinking. Does have headache as well. No recent trauma or falls. Says it is occipital. Does sometimes get it after drinking. No hx of alcohol withdrawal seizures.        Home Medications Prior to Admission medications   Medication Sig Start Date End Date Taking? Authorizing Provider  chlordiazePOXIDE (LIBRIUM) 25 MG capsule Take 2 capsules (50mg ) by mouth 3 times a day for 1 day,  then 1-2 capsules (25-50mg ) twice a day for 1 day, then 1-2 capsules (25-50mg ) daily for 1 day 02/01/23  Yes Rondel Baton, MD  ondansetron (ZOFRAN-ODT) 4 MG disintegrating tablet Dissolve 1 tablet (4 mg total) in mouth every 8 (eight) hours as needed for nausea or vomiting. 02/01/23  Yes Rondel Baton, MD  folic acid (FOLVITE) 1 MG tablet Take 1 tablet (1 mg total) by mouth daily. 12/06/22   Elgergawy, Leana Roe, MD  metFORMIN (GLUCOPHAGE-XR) 750 MG 24 hr tablet Take 2 tablets (1,500 mg total) by mouth daily with breakfast. Patient not taking: Reported on 12/08/2022 12/06/22 01/05/23  Elgergawy, Leana Roe, MD  Multiple Vitamin (MULTIVITAMIN WITH MINERALS) TABS tablet Take 1 tablet by mouth daily. 12/06/22   Elgergawy, Leana Roe, MD  thiamine (VITAMIN B1) 100 MG tablet Take 1 tablet (100 mg total) by mouth daily. 12/10/22   Elgergawy, Leana Roe, MD       Allergies    Patient has no known allergies.    Review of Systems   Review of Systems  Physical Exam Updated Vital Signs BP 139/86   Pulse 100   Temp 98 F (36.7 C) (Oral)   Resp 17   Ht 5\' 8"  (1.727 m)   Wt 76.2 kg   SpO2 100%   BMI 25.54 kg/m  Physical Exam Vitals and nursing note reviewed.  Constitutional:      General: He is not in acute distress.    Appearance: He is well-developed.  HENT:     Head: Normocephalic and atraumatic.     Comments: No signs of trauma    Right Ear: External ear normal.     Left Ear: External ear normal.     Nose: Nose normal.  Eyes:     Extraocular Movements: Extraocular movements intact.     Conjunctiva/sclera: Conjunctivae normal.     Pupils: Pupils are equal, round, and reactive to light.  Neck:     Comments: No C-spine midline tenderness to palpation or pain with ranging neck Cardiovascular:     Rate and Rhythm: Normal rate and regular rhythm.     Heart sounds: Normal heart sounds.     Comments: Chest pain not reproducible Pulmonary:     Effort: Pulmonary effort is normal. No respiratory distress.     Breath sounds:  Normal breath sounds.  Abdominal:     General: There is no distension.     Palpations: Abdomen is soft. There is no mass.     Tenderness: There is abdominal tenderness (Epigastrium). There is no guarding.  Musculoskeletal:     Cervical back: Normal range of motion and neck supple.     Right lower leg: No edema.     Left lower leg: No edema.  Skin:    General: Skin is warm and dry.  Neurological:     Mental Status: He is alert and oriented to person, place, and time. Mental status is at baseline.     Cranial Nerves: No cranial nerve deficit.     Sensory: No sensory deficit.     Motor: No weakness.     Comments: No tongue fasciculations or tremor noted  Psychiatric:        Mood and Affect: Mood normal.        Behavior: Behavior normal.     ED Results / Procedures / Treatments   Labs (all labs  ordered are listed, but only abnormal results are displayed) Labs Reviewed  BASIC METABOLIC PANEL - Abnormal; Notable for the following components:      Result Value   Potassium 3.3 (*)    Chloride 95 (*)    CO2 21 (*)    Glucose, Bld 133 (*)    BUN <5 (*)    Calcium 8.4 (*)    Anion gap 19 (*)    All other components within normal limits  CBC  LIPASE, BLOOD  TROPONIN I (HIGH SENSITIVITY)    EKG EKG Interpretation Date/Time:  Friday February 01 2023 06:35:27 EDT Ventricular Rate:  101 PR Interval:  152 QRS Duration:  99 QT Interval:  359 QTC Calculation: 466 R Axis:   -43  Text Interpretation: Sinus tachycardia Left axis deviation Abnormal R-wave progression, late transition ST elevation, consider anterolateral injury mostly similar to july Confirmed by Marily Memos [phone]) on 02/01/2023 6:39:54 AM  Radiology DG Chest 2 View  Result Date: 02/01/2023 CLINICAL DATA:  Headache and chest pain EXAM: CHEST - 2 VIEW COMPARISON:  12/08/2022 FINDINGS: Artifact from EKG leads. Normal heart size and mediastinal contours. No acute infiltrate or edema. No effusion or pneumothorax. No acute osseous findings. IMPRESSION: No active cardiopulmonary disease. Electronically Signed   By: Tiburcio Pea M.D.   On: 02/01/2023 07:03    Procedures Procedures    Medications Ordered in ED Medications  famotidine (PEPCID) IVPB 20 mg premix (0 mg Intravenous Stopped 02/01/23 0843)  alum & mag hydroxide-simeth (MAALOX/MYLANTA) 200-200-20 MG/5ML suspension 30 mL (30 mLs Oral Given 02/01/23 0810)    And  lidocaine (XYLOCAINE) 2 % viscous mouth solution 15 mL (15 mLs Oral Given 02/01/23 0810)  ondansetron (ZOFRAN) injection 4 mg (4 mg Intravenous Given 02/01/23 0912)  acetaminophen (TYLENOL) tablet 500 mg (500 mg Oral Given 02/01/23 9528)    ED Course/ Medical Decision Making/ A&P                                 Medical Decision Making Amount and/or Complexity of Data Reviewed Labs:  ordered. Radiology: ordered.  Risk OTC drugs. Prescription drug management.   Derek Cruz is a 57 y.o. male with comorbidities that complicate the patient evaluation including alcohol abuse and diabetes who presents emergency department with headache and nausea and vomiting in the setting of increased alcohol use  Initial Ddx:  Alcohol intoxication, withdrawal, hangover, TBI, ACS, gastritis, pancreatitis  MDM/Course:  Patient has frequent presentations to the emergency department for alcohol intoxication.  Today he presents with nausea and vomiting as well as a headache and chest discomfort.  Says that he gets the symptoms when he drinks.  Denies any recent trauma and does not have any head trauma on exam.  Did have an EKG and troponin that did not show any evidence of acute MI.  Suspect that his chest discomfort is likely due to gastritis so was given a GI cocktail which improved his symptoms.  Upon re-evaluation patient remained stable and did not have any signs of alcohol withdrawal.  He is clinically sober at the time of disposition.  Says he is interested in alcohol cessation so was given a Librium taper.  Did instruct him not to take this while drinking alcohol.  Return precautions discussed prior to discharge  This patient presents to the ED for concern of complaints listed in HPI, this involves an extensive number of treatment options, and is a complaint that carries with it a high risk of complications and morbidity. Disposition including potential need for admission considered.   Dispo: DC Home. Return precautions discussed including, but not limited to, those listed in the AVS. Allowed pt time to ask questions which were answered fully prior to dc.  Records reviewed Outpatient Clinic Notes The following labs were independently interpreted: CBC and show no acute abnormality I independently reviewed the following imaging with scope of interpretation limited to  determining acute life threatening conditions related to emergency care: Chest x-ray and agree with the radiologist interpretation with the following exceptions: none I personally reviewed and interpreted cardiac monitoring: normal sinus rhythm  I personally reviewed and interpreted the pt's EKG: see above for interpretation  I have reviewed the patients home medications and made adjustments as needed Social Determinants of health:  Spanish-speaking only         Final Clinical Impression(s) / ED Diagnoses Final diagnoses:  Alcohol abuse  Bad headache  Chest pain, unspecified type    Rx / DC Orders ED Discharge Orders          Ordered    ondansetron (ZOFRAN-ODT) 4 MG disintegrating tablet  Every 8 hours PRN        02/01/23 0920    chlordiazePOXIDE (LIBRIUM) 25 MG capsule        02/01/23 1021              Rondel Baton, MD 02/01/23 1712

## 2023-02-01 NOTE — Discharge Instructions (Signed)
You were seen for your alcohol use in the emergency department.   At home, please take the Librium if you are planning on stopping alcohol to prevent any alcohol withdrawal.  Do not take the Librium if you start drinking alcohol again because the combination can be dangerous.  Take the Zofran we have prescribed you for any nausea or vomiting.    Check your MyChart online for the results of any tests that had not resulted by the time you left the emergency department.   Lo atendieron por su consumo de alcohol en el departamento de emergencias.   En casa, tome Librium si planea dejar de beber alcohol para prevenir la abstinencia alcohlica.  No tome Librium si comienza a beber alcohol nuevamente porque la combinacin puede ser peligrosa.  Tome el Zofran que le hemos recetado para las nuseas o vmitos.    Consulte su MyChart en lnea para conocer los resultados de cualquier prueba que no haya resultado cuando sali del departamento de Sports administrator.   Haga un seguimiento con su mdico de atencin primaria en 2-3 das con respecto a su visita.    Regrese inmediatamente al departamento de emergencias si experimenta cualquiera de los siguientes sntomas: dolores de cabeza intensos, Engineer, mining en el pecho que empeora o cualquier otro sntoma preocupante.    Gracias por visitar nuestro Departamento de [address]. Fue un placer atenderte [employer].  ---  Follow-up with your primary doctor in 2-3 days regarding your visit.    Return immediately to the emergency department if you experience any of the following: Severe headaches, worsening chest pain, or any other concerning symptoms.    Thank you for visiting our Emergency Department. It was a pleasure taking care of you today.

## 2023-02-05 DIAGNOSIS — F332 Major depressive disorder, recurrent severe without psychotic features: Secondary | ICD-10-CM | POA: Insufficient documentation

## 2023-02-13 ENCOUNTER — Other Ambulatory Visit (HOSPITAL_COMMUNITY): Payer: Self-pay

## 2023-02-14 ENCOUNTER — Telehealth: Payer: Self-pay

## 2023-02-14 NOTE — Transitions of Care (Post Inpatient/ED Visit) (Signed)
02/14/2023  Name: Derek Cruz MRN: [id number] DOB: 26-Jun-1965  Today's TOC FU Call Status: Today's TOC FU Call Status:: Unsuccessful Call (1st Attempt) Unsuccessful Call (1st Attempt) Date: 02/14/23  Attempted to reach the patient regarding the most recent Inpatient/ED visit.  Follow Up Plan: Additional outreach attempts will be made to reach the patient to complete the Transitions of Care (Post Inpatient/ED visit) call.   Jodelle Gross RN, BSN, CCM [facility] RN Care Coordinator/ Transitions of Care Direct Dial: [phone]  Fax: [phone]

## 2023-02-15 ENCOUNTER — Telehealth: Payer: Self-pay

## 2023-02-15 NOTE — Transitions of Care (Post Inpatient/ED Visit) (Signed)
02/15/2023  Name: Poul Woolridge MRN: [id number] DOB: 14-Dec-1965  Today's TOC FU Call Status: Today's TOC FU Call Status:: Unsuccessful Call (2nd Attempt) Unsuccessful Call (2nd Attempt) Date: 02/15/23  Attempted to reach the patient regarding the most recent Inpatient/ED visit.  Follow Up Plan: Additional outreach attempts will be made to reach the patient to complete the Transitions of Care (Post Inpatient/ED visit) call.   Jodelle Gross RN, BSN, CCM [facility] RN Care Coordinator/ Transitions of Care Direct Dial: [phone]  Fax: [phone]

## 2023-02-18 ENCOUNTER — Telehealth: Payer: Self-pay

## 2023-02-18 NOTE — Transitions of Care (Post Inpatient/ED Visit) (Signed)
02/18/2023  Name: Nay Killinger MRN: [id number] DOB: [birth year]  Today's TOC FU Call Status: Today's TOC FU Call Status:: Unsuccessful Call (3rd Attempt) Unsuccessful Call (3rd Attempt) Date: 02/18/23  Attempted to reach the patient regarding the most recent Inpatient/ED visit.  Follow Up Plan: No further outreach attempts will be made at this time. We have been unable to contact the patient.  Jodelle Gross RN, BSN, CCM RN Care Manager  Transitions of Care  [facility]  [phone]

## 2023-02-21 ENCOUNTER — Ambulatory Visit (HOSPITAL_COMMUNITY): Payer: No Payment, Other

## 2023-02-26 ENCOUNTER — Other Ambulatory Visit: Payer: Self-pay

## 2023-03-12 ENCOUNTER — Ambulatory Visit (HOSPITAL_COMMUNITY): Payer: No Payment, Other | Admitting: Physician Assistant

## 2023-07-17 ENCOUNTER — Encounter (HOSPITAL_COMMUNITY): Payer: Self-pay

## 2023-07-17 ENCOUNTER — Other Ambulatory Visit: Payer: Self-pay

## 2023-07-17 ENCOUNTER — Emergency Department (HOSPITAL_COMMUNITY)
Admission: EM | Admit: 2023-07-17 | Discharge: 2023-07-17 | Disposition: A | Discharge status: Home / Self Care | Payer: Self-pay | Attending: Emergency Medicine | Admitting: Emergency Medicine

## 2023-07-17 DIAGNOSIS — Z7984 Long term (current) use of oral hypoglycemic drugs: Secondary | ICD-10-CM | POA: Insufficient documentation

## 2023-07-17 DIAGNOSIS — F10920 Alcohol use, unspecified with intoxication, uncomplicated: Secondary | ICD-10-CM

## 2023-07-17 DIAGNOSIS — F10120 Alcohol abuse with intoxication, uncomplicated: Secondary | ICD-10-CM | POA: Insufficient documentation

## 2023-07-17 DIAGNOSIS — R519 Headache, unspecified: Secondary | ICD-10-CM | POA: Insufficient documentation

## 2023-07-17 DIAGNOSIS — E119 Type 2 diabetes mellitus without complications: Secondary | ICD-10-CM | POA: Insufficient documentation

## 2023-07-17 LAB — CBC WITH DIFFERENTIAL/PLATELET
Abs Immature Granulocytes: 0.02 10*3/uL (ref 0.00–0.07)
Basophils Absolute: 0.1 10*3/uL (ref 0.0–0.1)
Basophils Relative: 1 %
Eosinophils Absolute: 0 10*3/uL (ref 0.0–0.5)
Eosinophils Relative: 1 %
HCT: 41.9 % (ref 39.0–52.0)
Hemoglobin: 15.1 g/dL (ref 13.0–17.0)
Immature Granulocytes: 0 %
Lymphocytes Relative: 33 %
Lymphs Abs: 2.2 10*3/uL (ref 0.7–4.0)
MCH: 30.1 pg (ref 26.0–34.0)
MCHC: 36 g/dL (ref 30.0–36.0)
MCV: 83.6 fL (ref 80.0–100.0)
Monocytes Absolute: 0.6 10*3/uL (ref 0.1–1.0)
Monocytes Relative: 9 %
Neutro Abs: 3.7 10*3/uL (ref 1.7–7.7)
Neutrophils Relative %: 56 %
Platelets: 159 10*3/uL (ref 150–400)
RBC: 5.01 MIL/uL (ref 4.22–5.81)
RDW: 12.8 % (ref 11.5–15.5)
WBC: 6.6 10*3/uL (ref 4.0–10.5)
nRBC: 0 % (ref 0.0–0.2)

## 2023-07-17 NOTE — ED Notes (Signed)
 MTF   Rexford Maus, DO 07/17/23 [phone]

## 2023-07-17 NOTE — ED Notes (Signed)
 Save dark green and LT in main lab

## 2023-07-17 NOTE — ED Notes (Signed)
 Pt blood glucose was 177 at 1901. Glucometer is not transferring data. Dr. Theresia Lo notified

## 2023-07-17 NOTE — Discharge Instructions (Signed)
 You were seen in the emergency department for your alcohol intoxication.  You were monitored until you were sober.  You can follow-up with your primary doctor or with an outpatient rehab if you would like help with your alcohol use.  You should return to the emergency department if you have any other new or concerning symptoms.

## 2023-07-17 NOTE — ED Notes (Signed)
 PT able to walk w/o assist to restroom

## 2023-07-17 NOTE — ED Provider Notes (Signed)
 [place] EMERGENCY DEPARTMENT AT [facility] Provider Note   CSN: [id number] Arrival date & time: 07/17/23  1751     History  Chief Complaint  Patient presents with   Headache   Alcohol Intoxication    Derek Cruz is a 58 y.o. male.  Patient is a 58 year old male with a past medical history of diabetes and alcohol use disorder presenting to the emergency department with alcohol intoxication.  Patient tells me that he drank 3 24 ounce beers today because he has been depressed as he is going through a separation with his wife.  Denies any SI and denies drinking an attempt to harm himself.  He reports to me that he does not a daily drinker but has been seen in the ED multiple times in the past week for alcohol intoxication.  He reported to triage RN that he was having a headache but denies any pain or other symptoms to me.  The history is provided by the patient. The history is limited by the condition of the patient (Intoxication).  Headache Alcohol Intoxication Associated symptoms include headaches.       Home Medications Prior to Admission medications   Medication Sig Start Date End Date Taking? Authorizing Provider  chlordiazePOXIDE (LIBRIUM) 25 MG capsule Take 2 capsules (50mg ) by mouth 3 times a day for 1 day,  then 1-2 capsules (25-50mg ) twice a day for 1 day, then 1-2 capsules (25-50mg ) daily for 1 day 02/01/23   Rondel Baton, MD  folic acid (FOLVITE) 1 MG tablet Take 1 tablet (1 mg total) by mouth daily. 12/06/22   Elgergawy, Leana Roe, MD  metFORMIN (GLUCOPHAGE-XR) 750 MG 24 hr tablet Take 2 tablets (1,500 mg total) by mouth daily with breakfast. Patient not taking: Reported on 12/08/2022 12/06/22 01/05/23  Elgergawy, Leana Roe, MD  Multiple Vitamin (MULTIVITAMIN WITH MINERALS) TABS tablet Take 1 tablet by mouth daily. 12/06/22   Elgergawy, Leana Roe, MD  ondansetron (ZOFRAN-ODT) 4 MG disintegrating tablet Dissolve 1 tablet (4 mg total) in mouth  every 8 (eight) hours as needed for nausea or vomiting. 02/01/23   Rondel Baton, MD  thiamine (VITAMIN B1) 100 MG tablet Take 1 tablet (100 mg total) by mouth daily. 12/10/22   Elgergawy, Leana Roe, MD      Allergies    Patient has no known allergies.    Review of Systems   Review of Systems  Neurological:  Positive for headaches.    Physical Exam Updated Vital Signs BP (!) 140/90   Pulse 100   Temp 98.5 F (36.9 C) (Oral)   Resp 16   Ht 5\' 8"  (1.727 m)   Wt 76.2 kg   SpO2 94%   BMI 25.54 kg/m  Physical Exam Vitals and nursing note reviewed.  Constitutional:      General: He is not in acute distress.    Comments: Drowsy, arousable to noxious stimulation, falling asleep on exam, intoxicated appearing  HENT:     Head: Normocephalic and atraumatic.  Eyes:     Extraocular Movements: Extraocular movements intact.  Cardiovascular:     Rate and Rhythm: Normal rate and regular rhythm.     Heart sounds: Normal heart sounds.  Pulmonary:     Effort: Pulmonary effort is normal.     Breath sounds: Normal breath sounds.  Abdominal:     Palpations: Abdomen is soft.     Tenderness: There is no abdominal tenderness.  Musculoskeletal:  General: No swelling. Normal range of motion.     Cervical back: Normal range of motion and neck supple.  Skin:    General: Skin is warm and dry.  Neurological:     Mental Status: He is lethargic.     Comments: Moving all extremities spontaneously, not staying awake long enough to participate in full neurologic exam  Psychiatric:        Mood and Affect: Mood normal.        Behavior: Behavior normal.     ED Results / Procedures / Treatments   Labs (all labs ordered are listed, but only abnormal results are displayed) Labs Reviewed  CBC WITH DIFFERENTIAL/PLATELET  CBG MONITORING, ED    EKG None  Radiology No results found.  Procedures Procedures    Medications Ordered in ED Medications - No data to display  ED Course/  Medical Decision Making/ A&P Clinical Course as of 07/17/23 2204  Wed Jul 17, 2023  2037 Glucose 177, patient still asleep in bed. Will be continued to be monitored for sobriety. [VK]  2203 Upon reassessment, the patient is awake and alert and appears clinically sober.  He is able to ambulate steadily.  He is stable for discharge home and plans to call for a ride home.  He was given strict return precautions. [VK]    Clinical Course User Index [VK] Rexford Maus, DO                                 Medical Decision Making This patient presents to the ED with chief complaint(s) of ETOH intoxication with pertinent past medical history of ETOH use, DM which further complicates the presenting complaint. The complaint involves an extensive differential diagnosis and also carries with it a high risk of complications and morbidity.    The differential diagnosis includes Alcohol intoxication, no signs of trauma, hypo or hyperglycemia, no report of SI   Additional history obtained: Additional history obtained from N/A Records reviewed Care Everywhere/External Records  ED Course and Reassessment: On patient's arrival he was hemodynamically stable in no acute distress.  He is significantly intoxicated appearing and does endorse drinking alcohol today.  He denies any other complaints to me.  He will have Accu-Chek performed.  Was seen at outside hospital 2 days ago and had extensive workup including head CT and C-spine, EKG and troponins are all normal and was given outpatient alcohol rehab resources.  Patient will continue to be monitored for sobriety.  Independent labs interpretation:  The following labs were independently interpreted: within normal range  Independent visualization of imaging: - N/A  Consultation: - Consulted or discussed management/test interpretation w/ external professional: N/A  Consideration for admission or further workup: Patient has no emergent conditions  requiring admission or further work-up at this time and is stable for discharge home with primary care follow-up  Social Determinants of health: ETOH use disorder    Amount and/or Complexity of Data Reviewed Labs: ordered.          Final Clinical Impression(s) / ED Diagnoses Final diagnoses:  Alcoholic intoxication without complication [facility])    Rx / DC Orders ED Discharge Orders     None         Rexford Maus, DO 07/17/23 2204

## 2023-07-17 NOTE — ED Triage Notes (Signed)
 BIB EMS from home for headache. Pt has been drinking alcohol since midnight, had 9, 40oz of beer. Denies drug use.

## 2023-07-19 LAB — CBG MONITORING, ED: Glucose-Capillary: 177 mg/dL — ABNORMAL HIGH (ref 70–99)

## 2023-07-29 ENCOUNTER — Encounter (HOSPITAL_COMMUNITY): Payer: Self-pay

## 2023-07-29 ENCOUNTER — Emergency Department (HOSPITAL_COMMUNITY)
Admission: EM | Admit: 2023-07-29 | Discharge: 2023-07-29 | Discharge status: Against Medical Advice | Payer: Self-pay | Attending: Emergency Medicine | Admitting: Emergency Medicine

## 2023-07-29 ENCOUNTER — Other Ambulatory Visit: Payer: Self-pay

## 2023-07-29 DIAGNOSIS — R519 Headache, unspecified: Secondary | ICD-10-CM | POA: Insufficient documentation

## 2023-07-29 DIAGNOSIS — Y908 Blood alcohol level of 240 mg/100 ml or more: Secondary | ICD-10-CM | POA: Insufficient documentation

## 2023-07-29 DIAGNOSIS — Z5321 Procedure and treatment not carried out due to patient leaving prior to being seen by health care provider: Secondary | ICD-10-CM | POA: Insufficient documentation

## 2023-07-29 DIAGNOSIS — F1010 Alcohol abuse, uncomplicated: Secondary | ICD-10-CM | POA: Insufficient documentation

## 2023-07-29 DIAGNOSIS — R112 Nausea with vomiting, unspecified: Secondary | ICD-10-CM | POA: Insufficient documentation

## 2023-07-29 LAB — COMPREHENSIVE METABOLIC PANEL
ALT: 91 U/L — ABNORMAL HIGH (ref 0–44)
AST: 131 U/L — ABNORMAL HIGH (ref 15–41)
Albumin: 4.4 g/dL (ref 3.5–5.0)
Alkaline Phosphatase: 89 U/L (ref 38–126)
Anion gap: 18 — ABNORMAL HIGH (ref 5–15)
BUN: 7 mg/dL (ref 6–20)
CO2: 20 mmol/L — ABNORMAL LOW (ref 22–32)
Calcium: 8.9 mg/dL (ref 8.9–10.3)
Chloride: 102 mmol/L (ref 98–111)
Creatinine, Ser: 0.6 mg/dL — ABNORMAL LOW (ref 0.61–1.24)
GFR, Estimated: >60 mL/min (ref >60)
Glucose, Bld: 147 mg/dL — ABNORMAL HIGH (ref 70–99)
Potassium: 3.3 mmol/L — ABNORMAL LOW (ref 3.5–5.1)
Sodium: 140 mmol/L (ref 135–145)
Total Bilirubin: 0.9 mg/dL (ref 0.0–1.2)
Total Protein: 8 g/dL (ref 6.5–8.1)

## 2023-07-29 LAB — CBC
HCT: 44.8 % (ref 39.0–52.0)
Hemoglobin: 15.7 g/dL (ref 13.0–17.0)
MCH: 30.3 pg (ref 26.0–34.0)
MCHC: 35 g/dL (ref 30.0–36.0)
MCV: 86.5 fL (ref 80.0–100.0)
Platelets: 139 10*3/uL — ABNORMAL LOW (ref 150–400)
RBC: 5.18 MIL/uL (ref 4.22–5.81)
RDW: 14.3 % (ref 11.5–15.5)
WBC: 4.5 10*3/uL (ref 4.0–10.5)
nRBC: 0 % (ref 0.0–0.2)

## 2023-07-29 LAB — ETHANOL: Alcohol, Ethyl (B): 409 mg/dL (ref <10)

## 2023-07-29 NOTE — ED Triage Notes (Signed)
 N/V, headache that started this AM. Pt states last drink was 2 hours ago, 1 beer and a shot of liquor. Pt is requesting help with alcohol use and wants to quit

## 2023-07-29 NOTE — ED Notes (Signed)
 Pt did not answer x2 for vitals from lobby at this time.

## 2023-07-29 NOTE — ED Notes (Signed)
 Pt did not answer x1 for vitals from lobby at this time.

## 2023-08-01 ENCOUNTER — Emergency Department (HOSPITAL_COMMUNITY)
Admission: EM | Admit: 2023-08-01 | Discharge: 2023-08-01 | Disposition: A | Discharge status: Home / Self Care | Payer: Self-pay | Attending: Emergency Medicine | Admitting: Emergency Medicine

## 2023-08-01 ENCOUNTER — Emergency Department (HOSPITAL_COMMUNITY): Payer: Self-pay

## 2023-08-01 ENCOUNTER — Other Ambulatory Visit: Payer: Self-pay

## 2023-08-01 DIAGNOSIS — E1165 Type 2 diabetes mellitus with hyperglycemia: Secondary | ICD-10-CM | POA: Insufficient documentation

## 2023-08-01 DIAGNOSIS — Y905 Blood alcohol level of 100-119 mg/100 ml: Secondary | ICD-10-CM | POA: Insufficient documentation

## 2023-08-01 DIAGNOSIS — F1010 Alcohol abuse, uncomplicated: Secondary | ICD-10-CM | POA: Insufficient documentation

## 2023-08-01 DIAGNOSIS — G5602 Carpal tunnel syndrome, left upper limb: Secondary | ICD-10-CM | POA: Insufficient documentation

## 2023-08-01 DIAGNOSIS — R1013 Epigastric pain: Secondary | ICD-10-CM | POA: Insufficient documentation

## 2023-08-01 DIAGNOSIS — I129 Hypertensive chronic kidney disease with stage 1 through stage 4 chronic kidney disease, or unspecified chronic kidney disease: Secondary | ICD-10-CM | POA: Insufficient documentation

## 2023-08-01 DIAGNOSIS — Z7984 Long term (current) use of oral hypoglycemic drugs: Secondary | ICD-10-CM | POA: Insufficient documentation

## 2023-08-01 DIAGNOSIS — R251 Tremor, unspecified: Secondary | ICD-10-CM | POA: Insufficient documentation

## 2023-08-01 DIAGNOSIS — Z87891 Personal history of nicotine dependence: Secondary | ICD-10-CM | POA: Insufficient documentation

## 2023-08-01 DIAGNOSIS — E1122 Type 2 diabetes mellitus with diabetic chronic kidney disease: Secondary | ICD-10-CM | POA: Insufficient documentation

## 2023-08-01 DIAGNOSIS — K760 Fatty (change of) liver, not elsewhere classified: Secondary | ICD-10-CM | POA: Insufficient documentation

## 2023-08-01 DIAGNOSIS — N183 Chronic kidney disease, stage 3 unspecified: Secondary | ICD-10-CM | POA: Insufficient documentation

## 2023-08-01 DIAGNOSIS — E876 Hypokalemia: Secondary | ICD-10-CM | POA: Insufficient documentation

## 2023-08-01 LAB — CBC WITH DIFFERENTIAL/PLATELET
Abs Immature Granulocytes: 0.02 10*3/uL (ref 0.00–0.07)
Basophils Absolute: 0 10*3/uL (ref 0.0–0.1)
Basophils Relative: 1 %
Eosinophils Absolute: 0 10*3/uL (ref 0.0–0.5)
Eosinophils Relative: 0 %
HCT: 37.7 % — ABNORMAL LOW (ref 39.0–52.0)
Hemoglobin: 13.8 g/dL (ref 13.0–17.0)
Immature Granulocytes: 1 %
Lymphocytes Relative: 11 %
Lymphs Abs: 0.4 10*3/uL — ABNORMAL LOW (ref 0.7–4.0)
MCH: 30.7 pg (ref 26.0–34.0)
MCHC: 36.6 g/dL — ABNORMAL HIGH (ref 30.0–36.0)
MCV: 84 fL (ref 80.0–100.0)
Monocytes Absolute: 0.4 10*3/uL (ref 0.1–1.0)
Monocytes Relative: 9 %
Neutro Abs: 3.2 10*3/uL (ref 1.7–7.7)
Neutrophils Relative %: 78 %
Platelets: 125 10*3/uL — ABNORMAL LOW (ref 150–400)
RBC: 4.49 MIL/uL (ref 4.22–5.81)
RDW: 14.3 % (ref 11.5–15.5)
WBC: 4 10*3/uL (ref 4.0–10.5)
nRBC: 0 % (ref 0.0–0.2)

## 2023-08-01 LAB — COMPREHENSIVE METABOLIC PANEL
ALT: 99 U/L — ABNORMAL HIGH (ref 0–44)
AST: 164 U/L — ABNORMAL HIGH (ref 15–41)
Albumin: 3.9 g/dL (ref 3.5–5.0)
Alkaline Phosphatase: 84 U/L (ref 38–126)
Anion gap: 17 — ABNORMAL HIGH (ref 5–15)
BUN: 7 mg/dL (ref 6–20)
CO2: 21 mmol/L — ABNORMAL LOW (ref 22–32)
Calcium: 8.6 mg/dL — ABNORMAL LOW (ref 8.9–10.3)
Chloride: 94 mmol/L — ABNORMAL LOW (ref 98–111)
Creatinine, Ser: 0.62 mg/dL (ref 0.61–1.24)
GFR, Estimated: >60 mL/min (ref >60)
Glucose, Bld: 134 mg/dL — ABNORMAL HIGH (ref 70–99)
Potassium: 3 mmol/L — ABNORMAL LOW (ref 3.5–5.1)
Sodium: 132 mmol/L — ABNORMAL LOW (ref 135–145)
Total Bilirubin: 1.1 mg/dL (ref 0.0–1.2)
Total Protein: 7.2 g/dL (ref 6.5–8.1)

## 2023-08-01 LAB — ETHANOL: Alcohol, Ethyl (B): 109 mg/dL — ABNORMAL HIGH (ref <10)

## 2023-08-01 LAB — LIPASE, BLOOD: Lipase: 36 U/L (ref 11–51)

## 2023-08-01 MED ORDER — PANTOPRAZOLE SODIUM 40 MG IV SOLR
40.0000 mg | Freq: Once | INTRAVENOUS | Status: AC
  Administered 2023-08-01: 40 mg via INTRAVENOUS
  Filled 2023-08-01: qty 10

## 2023-08-01 MED ORDER — ALUM & MAG HYDROXIDE-SIMETH 200-200-20 MG/5ML PO SUSP
15.0000 mL | Freq: Once | ORAL | Status: AC
  Administered 2023-08-01: 15 mL via ORAL
  Filled 2023-08-01: qty 30

## 2023-08-01 MED ORDER — LORAZEPAM 1 MG PO TABS
1.0000 mg | ORAL_TABLET | Freq: Once | ORAL | Status: AC
  Administered 2023-08-01: 1 mg via ORAL
  Filled 2023-08-01: qty 1

## 2023-08-01 MED ORDER — POTASSIUM CHLORIDE CRYS ER 20 MEQ PO TBCR
20.0000 meq | EXTENDED_RELEASE_TABLET | Freq: Every day | ORAL | 0 refills | Status: AC
Start: 2023-08-01 — End: 2023-12-26
  Filled 2023-08-01: qty 5, 5d supply, fill #0

## 2023-08-01 MED ORDER — ONDANSETRON HCL 4 MG PO TABS
4.0000 mg | ORAL_TABLET | Freq: Four times a day (QID) | ORAL | 0 refills | Status: DC | PRN
  Filled 2023-08-01: qty 12, 3d supply, fill #0

## 2023-08-01 MED ORDER — LORAZEPAM 1 MG PO TABS
2.0000 mg | ORAL_TABLET | Freq: Once | ORAL | Status: AC
  Administered 2023-08-01: 2 mg via ORAL
  Filled 2023-08-01: qty 2

## 2023-08-01 MED ORDER — PANTOPRAZOLE SODIUM 20 MG PO TBEC
20.0000 mg | DELAYED_RELEASE_TABLET | Freq: Every day | ORAL | 0 refills | Status: AC
  Filled 2023-08-01: qty 14, 14d supply, fill #0

## 2023-08-01 MED ORDER — SODIUM CHLORIDE 0.9 % IV BOLUS
1000.0000 mL | Freq: Once | INTRAVENOUS | Status: AC
  Administered 2023-08-01: 1000 mL via INTRAVENOUS

## 2023-08-01 MED ORDER — ONDANSETRON HCL 4 MG/2ML IJ SOLN
4.0000 mg | Freq: Once | INTRAMUSCULAR | Status: AC
  Administered 2023-08-01: 4 mg via INTRAVENOUS
  Filled 2023-08-01: qty 2

## 2023-08-01 MED ORDER — SUCRALFATE 1 G PO TABS
1.0000 g | ORAL_TABLET | Freq: Three times a day (TID) | ORAL | 0 refills | Status: AC
  Filled 2023-08-01: qty 21, 7d supply, fill #0

## 2023-08-01 MED ORDER — IOHEXOL 350 MG/ML SOLN
75.0000 mL | Freq: Once | INTRAVENOUS | Status: AC | PRN
  Administered 2023-08-01: 75 mL via INTRAVENOUS

## 2023-08-01 NOTE — ED Provider Notes (Signed)
 [place] EMERGENCY DEPARTMENT AT [facility] Provider Note  CSN: [id number] Arrival date & time: 08/01/23 4098  Chief Complaint(s) No chief complaint on file.  HPI Patrice Maryelizabeth Rowan is a 58 y.o. male with past medical history as below, significant for HLD, hypertension, alcohol abuse, CKD, alcoholic hepatitis who presents to the ED with complaint of abdominal pain.  Patient reports last drink was 2 days ago, he drinks typically 3 tall boys sized beers daily.  He has had increased shaking and tremors over the past 24 hours, nausea and vomiting this morning.  Generalized abdominal pain, epigastrium, right flank area.  Patient also reports tingling to his left hand from the wrist down since waking up this morning.  Patient thinks he might be starting to withdraw from alcohol  Past Medical History Past Medical History:  Diagnosis Date   Alcohol abuse    Chest pain    Dyslipidemia    Elevated liver enzymes    Gastritis    Hypertension    Patient Active Problem List   Diagnosis Date Noted   Alcohol withdrawal (HCC) 12/08/2022   Abnormal TSH 12/08/2022   Hypokalemia 12/08/2022   CKD (chronic kidney disease), stage III (HCC) 12/08/2022   Alcohol use disorder 12/01/2022   Frequent falls 12/01/2022   Elevated liver enzymes    Alcohol abuse    Liver failure (HCC) 05/07/2020   Alcoholic hepatitis    Hyponatremia    Polycythemia    Hemoglobin A1c less than 7.0% 07/16/2019   Neck pain 03/31/2019   Muscle strain 03/31/2019   Undifferentiated abdominal pain 03/31/2019   Gastroesophageal reflux disease without esophagitis 11/24/2018   Chest discomfort 11/24/2018   Essential hypertension 11/24/2018   Pre-diabetes 11/24/2018   Hyperlipidemia 10/06/2016   Chest pain 09/26/2016   Uncontrolled type 2 diabetes mellitus with hyperglycemia, without long-term current use of insulin (HCC) 09/26/2016   Home Medication(s) Prior to Admission medications   Medication Sig  Start Date End Date Taking? Authorizing Provider  pantoprazole (PROTONIX) 20 MG tablet Take 1 tablet (20 mg total) by mouth daily for 14 days. 08/01/23 08/16/23 Yes Tanda Rockers A, DO  potassium chloride SA (KLOR-CON M) 20 MEQ tablet Take 1 tablet (20 mEq total) by mouth daily for 5 days. 08/01/23 08/07/23 Yes Tanda Rockers A, DO  sucralfate (CARAFATE) 1 g tablet Take 1 tablet (1 g total) by mouth with breakfast, with lunch, and with evening meal for 7 days. 08/01/23 08/09/23 Yes Sloan Leiter, DO  chlordiazePOXIDE (LIBRIUM) 25 MG capsule Take 2 capsules (50mg ) by mouth 3 times a day for 1 day,  then 1-2 capsules (25-50mg ) twice a day for 1 day, then 1-2 capsules (25-50mg ) daily for 1 day 02/01/23   Rondel Baton, MD  folic acid (FOLVITE) 1 MG tablet Take 1 tablet (1 mg total) by mouth daily. 12/06/22   Elgergawy, Leana Roe, MD  Melatonin 1 MG CHEW Chew 1 tablet by mouth at bedtime as needed. 08/03/23   Marita Kansas, PA-C  metFORMIN (GLUCOPHAGE-XR) 750 MG 24 hr tablet Take 2 tablets (1,500 mg total) by mouth daily with breakfast. Patient not taking: Reported on 12/08/2022 12/06/22 01/05/23  Elgergawy, Leana Roe, MD  Multiple Vitamin (MULTIVITAMIN WITH MINERALS) TABS tablet Take 1 tablet by mouth daily. 12/06/22   Elgergawy, Leana Roe, MD  ondansetron (ZOFRAN-ODT) 4 MG disintegrating tablet Take 1 tablet (4 mg total) by mouth every 8 (eight) hours as needed. 08/03/23   Marita Kansas, PA-C  thiamine (VITAMIN B1) 100  MG tablet Take 1 tablet (100 mg total) by mouth daily. 12/10/22   Elgergawy, Leana Roe, MD                                                                                                                                    Past Surgical History No past surgical history on file. Family History Family History  Problem Relation Age of Onset   Diabetes Mellitus II Neg Hx    Stomach cancer Neg Hx    Colon cancer Neg Hx     Social History Social History   Tobacco Use   Smoking status: Former    Passive  exposure: Past   Smokeless tobacco: Never  Vaping Use   Vaping status: Never Used  Substance Use Topics   Alcohol use: Yes   Drug use: No   Allergies Patient has no known allergies.  Review of Systems A thorough review of systems was obtained and all systems are negative except as noted in the HPI and PMH.   Physical Exam Vital Signs  I have reviewed the triage vital signs BP (!) 129/97   Pulse 98   Temp 98.7 F (37.1 C) (Oral)   Resp 19   SpO2 96%  Physical Exam Vitals and nursing note reviewed.  Constitutional:      General: He is not in acute distress.    Appearance: He is well-developed.     Comments: Disheveled  HENT:     Head: Normocephalic and atraumatic.     Right Ear: External ear normal.     Left Ear: External ear normal.     Mouth/Throat:     Mouth: Mucous membranes are moist.  Eyes:     General: No scleral icterus. Cardiovascular:     Rate and Rhythm: Normal rate and regular rhythm.     Pulses: Normal pulses.     Heart sounds: Normal heart sounds.  Pulmonary:     Effort: Pulmonary effort is normal. No respiratory distress.     Breath sounds: Normal breath sounds.  Abdominal:     General: Abdomen is flat.     Palpations: Abdomen is soft.     Tenderness: There is no abdominal tenderness.  Musculoskeletal:       Hands:     Cervical back: No rigidity.     Right lower leg: No edema.     Left lower leg: No edema.  Skin:    General: Skin is warm and dry.     Capillary Refill: Capillary refill takes less than 2 seconds.  Neurological:     Mental Status: He is alert and oriented to person, place, and time.     Cranial Nerves: Cranial nerves 2-12 are intact.     Sensory: Sensation is intact.     Motor: Tremor present.     Coordination: Coordination is intact.     Comments: Paresthesias noted to left hand Strength 5/5  to BLUE/BLLE, equal and symmetric  Gait testing deferred secondary to patient safety.    Psychiatric:        Mood and Affect: Mood  normal.        Behavior: Behavior normal.     ED Results and Treatments Labs (all labs ordered are listed, but only abnormal results are displayed) Labs Reviewed  CBC WITH DIFFERENTIAL/PLATELET - Abnormal; Notable for the following components:      Result Value   HCT 37.7 (*)    MCHC 36.6 (*)    Platelets 125 (*)    Lymphs Abs 0.4 (*)    All other components within normal limits  COMPREHENSIVE METABOLIC PANEL - Abnormal; Notable for the following components:   Sodium 132 (*)    Potassium 3.0 (*)    Chloride 94 (*)    CO2 21 (*)    Glucose, Bld 134 (*)    Calcium 8.6 (*)    AST 164 (*)    ALT 99 (*)    Anion gap 17 (*)    All other components within normal limits  ETHANOL - Abnormal; Notable for the following components:   Alcohol, Ethyl (B) 109 (*)    All other components within normal limits  LIPASE, BLOOD                                                                                                                          Radiology No results found.  Pertinent labs & imaging results that were available during my care of the patient were reviewed by me and considered in my medical decision making (see MDM for details).  Medications Ordered in ED Medications  pantoprazole (PROTONIX) injection 40 mg (40 mg Intravenous Given 08/01/23 1048)  ondansetron (ZOFRAN) injection 4 mg (4 mg Intravenous Given 08/01/23 1048)  LORazepam (ATIVAN) tablet 2 mg (2 mg Oral Given 08/01/23 1048)  sodium chloride 0.9 % bolus 1,000 mL (0 mLs Intravenous Stopped 08/01/23 1513)  iohexol (OMNIPAQUE) 350 MG/ML injection 75 mL (75 mLs Intravenous Contrast Given 08/01/23 1257)  ondansetron (ZOFRAN) injection 4 mg (4 mg Intravenous Given 08/01/23 1544)  alum & mag hydroxide-simeth (MAALOX/MYLANTA) 200-200-20 MG/5ML suspension 15 mL (15 mLs Oral Given 08/01/23 1545)  LORazepam (ATIVAN) tablet 1 mg (1 mg Oral Given 08/01/23 1551)  Procedures Procedures  (including critical care time)  Medical Decision Making / ED Course    Medical Decision Making:    Talin Maryelizabeth Rowan is a 58 y.o. male with past medical history as below, significant for HLD, hypertension, alcohol abuse, CKD, alcoholic hepatitis who presents to the ED with complaint of abdominal pain.. The complaint involves an extensive differential diagnosis and also carries with it a high risk of complications and morbidity.  Serious etiology was considered. Ddx includes but is not limited to: Differential diagnosis includes but is not exclusive to acute cholecystitis, intrathoracic causes for epigastric abdominal pain, gastritis, duodenitis, pancreatitis, small bowel or large bowel obstruction, abdominal aortic aneurysm, hernia, gastritis, etc.   Complete initial physical exam performed, notably the patient was in no distress, intermittent tremulous.    Reviewed and confirmed nursing documentation for past medical history, family history, social history.  Vital signs reviewed.      Clinical Course as of 08/06/23 0049  Thu Aug 01, 2023  1602 Feeling better, able to tolerate some po. Still somewhat tremulous, does not appear to be c/w acute etoh withdrawal [SG]    Clinical Course User Index [SG] Sloan Leiter, DO    Brief summary: 58 year old male history as above here with abdominal pain, nausea He is feeling much better after fluids and medications here. His labs are stable, similar to his prior.  LFTs elevated consistent with chronic alcohol abuse, potassium is mildly reduced.  Ethanol elevated, daily alcohol use. Imaging is stable. His abdominal pain potentially secondary to alcoholic gastritis.  Encourage reduction alcohol use, start PPI and Carafate.  Follow-up with GI. Left wrist/hand tingling secondary to possible carpal tunnel syndrome.  Given follow-up with hand specialist. He is  tolerating PO, feeling better, stable for dc   The patient improved significantly and was discharged in stable condition. Detailed discussions were had with the patient/guardian regarding current findings, and need for close f/u with PCP or on call doctor. The patient/guardian has been instructed to return immediately if the symptoms worsen in any way for re-evaluation. Patient/guardian verbalized understanding and is in agreement with current care plan. All questions answered prior to discharge.   Interpreter utilized          Additional history obtained: -Additional history obtained from na -External records from outside source obtained and reviewed including: Chart review including previous notes, labs, imaging, consultation notes including  Or ED visits, prior labs and imaging, home medications   Lab Tests: -I ordered, reviewed, and interpreted labs.   The pertinent results include:   Labs Reviewed  CBC WITH DIFFERENTIAL/PLATELET - Abnormal; Notable for the following components:      Result Value   HCT 37.7 (*)    MCHC 36.6 (*)    Platelets 125 (*)    Lymphs Abs 0.4 (*)    All other components within normal limits  COMPREHENSIVE METABOLIC PANEL - Abnormal; Notable for the following components:   Sodium 132 (*)    Potassium 3.0 (*)    Chloride 94 (*)    CO2 21 (*)    Glucose, Bld 134 (*)    Calcium 8.6 (*)    AST 164 (*)    ALT 99 (*)    Anion gap 17 (*)    All other components within normal limits  ETHANOL - Abnormal; Notable for the following components:   Alcohol, Ethyl (B) 109 (*)    All other components within normal limits  LIPASE, BLOOD    Notable for  labs stable  EKG   EKG Interpretation Date/Time:    Ventricular Rate:    PR Interval:    QRS Duration:    QT Interval:    QTC Calculation:   R Axis:      Text Interpretation:           Imaging Studies ordered: I ordered imaging studies including CTAP CXR XR wrist I independently visualized  the following imaging with scope of interpretation limited to determining acute life threatening conditions related to emergency care; findings noted above I independently visualized and interpreted imaging. I agree with the radiologist interpretation   Medicines ordered and prescription drug management: Meds ordered this encounter  Medications   pantoprazole (PROTONIX) injection 40 mg   ondansetron (ZOFRAN) injection 4 mg   LORazepam (ATIVAN) tablet 2 mg   sodium chloride 0.9 % bolus 1,000 mL   iohexol (OMNIPAQUE) 350 MG/ML injection 75 mL   ondansetron (ZOFRAN) injection 4 mg   alum & mag hydroxide-simeth (MAALOX/MYLANTA) 200-200-20 MG/5ML suspension 15 mL   LORazepam (ATIVAN) tablet 1 mg   pantoprazole (PROTONIX) 20 MG tablet    Sig: Take 1 tablet (20 mg total) by mouth daily for 14 days.    Dispense:  14 tablet    Refill:  0   sucralfate (CARAFATE) 1 g tablet    Sig: Take 1 tablet (1 g total) by mouth with breakfast, with lunch, and with evening meal for 7 days.    Dispense:  21 tablet    Refill:  0   DISCONTD: ondansetron (ZOFRAN) 4 MG tablet    Sig: Take 1 tablet (4 mg total) by mouth every 6 (six) hours as needed for nausea or vomiting.    Dispense:  12 tablet    Refill:  0   potassium chloride SA (KLOR-CON M) 20 MEQ tablet    Sig: Take 1 tablet (20 mEq total) by mouth daily for 5 days.    Dispense:  5 tablet    Refill:  0    -I have reviewed the patients home medicines and have made adjustments as needed   Consultations Obtained: na   Cardiac Monitoring: The patient was maintained on a cardiac monitor.  I personally viewed and interpreted the cardiac monitored which showed an underlying rhythm of: nsr Continuous pulse oximetry interpreted by myself, 97% on RA.    Social Determinants of Health:  Diagnosis or treatment significantly limited by social determinants of health: former smoker and alcohol use   Reevaluation: After the interventions noted above, I  reevaluated the patient and found that they have improved  Co morbidities that complicate the patient evaluation  Past Medical History:  Diagnosis Date   Alcohol abuse    Chest pain    Dyslipidemia    Elevated liver enzymes    Gastritis    Hypertension       Dispostion: Disposition decision including need for hospitalization was considered, and patient discharged from emergency department.    Final Clinical Impression(s) / ED Diagnoses Final diagnoses:  Epigastric pain  Chronic alcohol abuse  Carpal tunnel syndrome of left wrist  Hepatic steatosis  Hypokalemia        Sloan Leiter, DO 08/06/23 1610

## 2023-08-01 NOTE — ED Triage Notes (Signed)
 Pt BIB [facility] FROM HOME FOR NUMBNESS TO LEFT forearm & hand..  STRENGTH NOT AFFECTED.  Pain to left upper abd, and right flank.   150/80 98% RA, HR 96 RR 16 CBG 166  Hx of HTN and ETOH.

## 2023-08-01 NOTE — Discharge Instructions (Addendum)
 It was a pleasure caring for you today in the emergency department.  Recommend you cut back on your alcohol use.  Please call gastroenterology to arrange follow-up in the next 2 weeks.  You likely have carpal tunnel syndrome to your left hand, please call orthopedic surgery to arrange follow-up  Please return to the emergency department for any worsening or worrisome symptoms.    Fue un placer atenderle [employer].  Le recomendamos que reduzca su consumo de alcohol.  Llame a gastroenterologa para programar una cita de seguimiento en las [employer].  Es probable que tenga sndrome del tnel carpiano en la mano izquierda. Llame a ciruga ortopdica para programar una cita de seguimiento.  Por favor, acuda a urgencias si presenta cualquier sntoma preocupante o que empeore.

## 2023-08-02 ENCOUNTER — Other Ambulatory Visit: Payer: Self-pay

## 2023-08-03 ENCOUNTER — Emergency Department (HOSPITAL_COMMUNITY)
Admission: EM | Admit: 2023-08-03 | Discharge: 2023-08-03 | Disposition: A | Discharge status: Home / Self Care | Payer: Self-pay | Attending: Emergency Medicine | Admitting: Emergency Medicine

## 2023-08-03 ENCOUNTER — Encounter (HOSPITAL_COMMUNITY): Payer: Self-pay | Admitting: Emergency Medicine

## 2023-08-03 ENCOUNTER — Other Ambulatory Visit: Payer: Self-pay

## 2023-08-03 ENCOUNTER — Other Ambulatory Visit (HOSPITAL_COMMUNITY): Payer: Self-pay

## 2023-08-03 DIAGNOSIS — R109 Unspecified abdominal pain: Secondary | ICD-10-CM | POA: Insufficient documentation

## 2023-08-03 DIAGNOSIS — I10 Essential (primary) hypertension: Secondary | ICD-10-CM | POA: Insufficient documentation

## 2023-08-03 DIAGNOSIS — R112 Nausea with vomiting, unspecified: Secondary | ICD-10-CM

## 2023-08-03 DIAGNOSIS — Z79899 Other long term (current) drug therapy: Secondary | ICD-10-CM | POA: Insufficient documentation

## 2023-08-03 LAB — RESP PANEL BY RT-PCR (RSV, FLU A&B, COVID)  RVPGX2
Influenza A by PCR: NEGATIVE
Influenza B by PCR: NEGATIVE
Resp Syncytial Virus by PCR: NEGATIVE
SARS Coronavirus 2 by RT PCR: NEGATIVE

## 2023-08-03 LAB — URINALYSIS, ROUTINE W REFLEX MICROSCOPIC
Bilirubin Urine: NEGATIVE
Glucose, UA: 150 mg/dL — AB
Hgb urine dipstick: NEGATIVE
Ketones, ur: NEGATIVE mg/dL
Leukocytes,Ua: NEGATIVE
Nitrite: NEGATIVE
Protein, ur: NEGATIVE mg/dL
Specific Gravity, Urine: 1.004 — ABNORMAL LOW (ref 1.005–1.030)
pH: 7 (ref 5.0–8.0)

## 2023-08-03 LAB — CBC
HCT: 39.9 % (ref 39.0–52.0)
Hemoglobin: 14.3 g/dL (ref 13.0–17.0)
MCH: 31 pg (ref 26.0–34.0)
MCHC: 35.8 g/dL (ref 30.0–36.0)
MCV: 86.4 fL (ref 80.0–100.0)
Platelets: 127 10*3/uL — ABNORMAL LOW (ref 150–400)
RBC: 4.62 MIL/uL (ref 4.22–5.81)
RDW: 14.6 % (ref 11.5–15.5)
WBC: 4.2 10*3/uL (ref 4.0–10.5)
nRBC: 0 % (ref 0.0–0.2)

## 2023-08-03 LAB — RAPID URINE DRUG SCREEN, HOSP PERFORMED
Amphetamines: NOT DETECTED
Barbiturates: NOT DETECTED
Benzodiazepines: NOT DETECTED
Cocaine: NOT DETECTED
Opiates: NOT DETECTED
Tetrahydrocannabinol: NOT DETECTED

## 2023-08-03 LAB — COMPREHENSIVE METABOLIC PANEL
ALT: 206 U/L — ABNORMAL HIGH (ref 0–44)
AST: 412 U/L — ABNORMAL HIGH (ref 15–41)
Albumin: 3.9 g/dL (ref 3.5–5.0)
Alkaline Phosphatase: 115 U/L (ref 38–126)
Anion gap: 10 (ref 5–15)
BUN: 8 mg/dL (ref 6–20)
CO2: 21 mmol/L — ABNORMAL LOW (ref 22–32)
Calcium: 8.7 mg/dL — ABNORMAL LOW (ref 8.9–10.3)
Chloride: 98 mmol/L (ref 98–111)
Creatinine, Ser: 0.6 mg/dL — ABNORMAL LOW (ref 0.61–1.24)
GFR, Estimated: >60 mL/min (ref >60)
Glucose, Bld: 201 mg/dL — ABNORMAL HIGH (ref 70–99)
Potassium: 3.2 mmol/L — ABNORMAL LOW (ref 3.5–5.1)
Sodium: 129 mmol/L — ABNORMAL LOW (ref 135–145)
Total Bilirubin: 1.3 mg/dL — ABNORMAL HIGH (ref 0.0–1.2)
Total Protein: 7.4 g/dL (ref 6.5–8.1)

## 2023-08-03 LAB — LIPASE, BLOOD: Lipase: 58 U/L — ABNORMAL HIGH (ref 11–51)

## 2023-08-03 LAB — ETHANOL: Alcohol, Ethyl (B): <10 mg/dL (ref <10)

## 2023-08-03 MED ORDER — LORAZEPAM 2 MG/ML IJ SOLN: 0.0000 mg | Freq: Two times a day (BID) | INTRAMUSCULAR | Status: DC

## 2023-08-03 MED ORDER — LORAZEPAM 2 MG/ML IJ SOLN
0.0000 mg | Freq: Four times a day (QID) | INTRAMUSCULAR | Status: DC
  Administered 2023-08-03: 1 mg via INTRAVENOUS
  Filled 2023-08-03: qty 1

## 2023-08-03 MED ORDER — MELATONIN 1 MG PO CHEW
1.0000 | CHEWABLE_TABLET | Freq: Every evening | ORAL | 0 refills | Status: AC | PRN
Start: 2023-08-03 — End: ?
  Filled 2023-08-03: qty 30, fill #0

## 2023-08-03 MED ORDER — THIAMINE MONONITRATE 100 MG PO TABS
100.0000 mg | ORAL_TABLET | Freq: Every day | ORAL | Status: DC
  Administered 2023-08-03: 100 mg via ORAL
  Filled 2023-08-03: qty 1

## 2023-08-03 MED ORDER — LACTATED RINGERS IV BOLUS
2000.0000 mL | Freq: Once | INTRAVENOUS | Status: AC
  Administered 2023-08-03: 2000 mL via INTRAVENOUS

## 2023-08-03 MED ORDER — LORAZEPAM 1 MG PO TABS: 0.0000 mg | ORAL_TABLET | Freq: Two times a day (BID) | ORAL | Status: DC

## 2023-08-03 MED ORDER — THIAMINE HCL 100 MG/ML IJ SOLN: 100.0000 mg | Freq: Every day | INTRAMUSCULAR | Status: DC

## 2023-08-03 MED ORDER — PANTOPRAZOLE SODIUM 40 MG IV SOLR
40.0000 mg | Freq: Once | INTRAVENOUS | Status: AC
  Administered 2023-08-03: 40 mg via INTRAVENOUS
  Filled 2023-08-03: qty 10

## 2023-08-03 MED ORDER — LORAZEPAM 1 MG PO TABS: 0.0000 mg | ORAL_TABLET | Freq: Four times a day (QID) | ORAL | Status: DC

## 2023-08-03 MED ORDER — ONDANSETRON 4 MG PO TBDP
4.0000 mg | ORAL_TABLET | Freq: Three times a day (TID) | ORAL | 0 refills | Status: DC | PRN
  Filled 2023-08-03 (×2): qty 20, 7d supply, fill #0

## 2023-08-03 MED ORDER — ONDANSETRON HCL 4 MG/2ML IJ SOLN
4.0000 mg | Freq: Once | INTRAMUSCULAR | Status: AC
  Administered 2023-08-03: 4 mg via INTRAVENOUS
  Filled 2023-08-03: qty 2

## 2023-08-03 MED ORDER — POTASSIUM CHLORIDE 20 MEQ PO PACK
60.0000 meq | PACK | Freq: Once | ORAL | Status: AC
  Administered 2023-08-03: 60 meq via ORAL
  Filled 2023-08-03: qty 3

## 2023-08-03 NOTE — ED Provider Notes (Signed)
  EMERGENCY DEPARTMENT AT [facility] Provider Note   CSN: [id number] Arrival date & time: 08/03/23  7829     History  Chief Complaint  Patient presents with   Fever   Alcohol Problem    Derek Cruz is a 58 y.o. male.  58 year old male with past medical history significant for alcohol abuse returns for ongoing abdominal pain, inability to tolerate p.o.  He states his last alcoholic drink was 3 days ago.  He states since he left the emergency department a couple days ago he has not been able to tolerate any p.o.  States the smell of food induces nausea and vomiting.  He states he has attempted to take the medicine he was given 2 days ago in the emergency department.  He currently does not feel like he is having any withdrawal symptoms.  The history is provided by the patient. No language interpreter was used.       Home Medications Prior to Admission medications   Medication Sig Start Date End Date Taking? Authorizing Provider  chlordiazePOXIDE (LIBRIUM) 25 MG capsule Take 2 capsules (50mg ) by mouth 3 times a day for 1 day,  then 1-2 capsules (25-50mg ) twice a day for 1 day, then 1-2 capsules (25-50mg ) daily for 1 day 02/01/23   Rondel Baton, MD  folic acid (FOLVITE) 1 MG tablet Take 1 tablet (1 mg total) by mouth daily. 12/06/22   Elgergawy, Leana Roe, MD  metFORMIN (GLUCOPHAGE-XR) 750 MG 24 hr tablet Take 2 tablets (1,500 mg total) by mouth daily with breakfast. Patient not taking: Reported on 12/08/2022 12/06/22 01/05/23  Elgergawy, Leana Roe, MD  Multiple Vitamin (MULTIVITAMIN WITH MINERALS) TABS tablet Take 1 tablet by mouth daily. 12/06/22   Elgergawy, Leana Roe, MD  ondansetron (ZOFRAN) 4 MG tablet Take 1 tablet (4 mg total) by mouth every 6 (six) hours as needed for nausea or vomiting. 08/01/23   Tanda Rockers A, DO  pantoprazole (PROTONIX) 20 MG tablet Take 1 tablet (20 mg total) by mouth daily for 14 days. 08/01/23 08/16/23  Tanda Rockers A, DO   potassium chloride SA (KLOR-CON M) 20 MEQ tablet Take 1 tablet (20 mEq total) by mouth daily for 5 days. 08/01/23 08/07/23  Sloan Leiter, DO  sucralfate (CARAFATE) 1 g tablet Take 1 tablet (1 g total) by mouth with breakfast, with lunch, and with evening meal for 7 days. 08/01/23 08/09/23  Tanda Rockers A, DO  thiamine (VITAMIN B1) 100 MG tablet Take 1 tablet (100 mg total) by mouth daily. 12/10/22   Elgergawy, Leana Roe, MD      Allergies    Patient has no known allergies.    Review of Systems   Review of Systems  Constitutional:  Negative for chills and fever.  Respiratory:  Negative for shortness of breath.   Cardiovascular:  Negative for chest pain.  Gastrointestinal:  Positive for nausea and vomiting. Negative for abdominal pain.  Genitourinary:  Negative for flank pain.  Neurological:  Negative for light-headedness.  All other systems reviewed and are negative.   Physical Exam Updated Vital Signs BP (!) 139/91   Pulse 84   Temp 98.6 F (37 C) (Oral)   Resp 20   Wt 77 kg   SpO2 99%   BMI 25.81 kg/m  Physical Exam Vitals and nursing note reviewed.  Constitutional:      General: He is not in acute distress.    Appearance: Normal appearance. He is not ill-appearing.  HENT:  Head: Normocephalic and atraumatic.     Nose: Nose normal.  Eyes:     Conjunctiva/sclera: Conjunctivae normal.  Cardiovascular:     Rate and Rhythm: Normal rate and regular rhythm.  Pulmonary:     Effort: Pulmonary effort is normal. No respiratory distress.     Breath sounds: No wheezing.  Abdominal:     General: There is no distension.     Tenderness: There is no abdominal tenderness. There is no guarding.  Musculoskeletal:        General: No deformity.  Skin:    Findings: No rash.  Neurological:     Mental Status: He is alert.     ED Results / Procedures / Treatments   Labs (all labs ordered are listed, but only abnormal results are displayed) Labs Reviewed  LIPASE, BLOOD -  Abnormal; Notable for the following components:      Result Value   Lipase 58 (*)    All other components within normal limits  COMPREHENSIVE METABOLIC PANEL - Abnormal; Notable for the following components:   Sodium 129 (*)    Potassium 3.2 (*)    CO2 21 (*)    Glucose, Bld 201 (*)    Creatinine, Ser 0.60 (*)    Calcium 8.7 (*)    AST 412 (*)    ALT 206 (*)    Total Bilirubin 1.3 (*)    All other components within normal limits  CBC - Abnormal; Notable for the following components:   Platelets 127 (*)    All other components within normal limits  URINALYSIS, ROUTINE W REFLEX MICROSCOPIC - Abnormal; Notable for the following components:   Color, Urine STRAW (*)    Specific Gravity, Urine 1.004 (*)    Glucose, UA 150 (*)    All other components within normal limits  RESP PANEL BY RT-PCR (RSV, FLU A&B, COVID)  RVPGX2  ETHANOL  RAPID URINE DRUG SCREEN, HOSP PERFORMED    EKG None  Radiology CT ABDOMEN PELVIS W CONTRAST Result Date: 08/01/2023 CLINICAL DATA:  Abdominal pain EXAM: CT ABDOMEN AND PELVIS WITH CONTRAST TECHNIQUE: Multidetector CT imaging of the abdomen and pelvis was performed using the standard protocol following bolus administration of intravenous contrast. RADIATION DOSE REDUCTION: This exam was performed according to the departmental dose-optimization program which includes automated exposure control, adjustment of the mA and/or kV according to patient size and/or use of iterative reconstruction technique. CONTRAST:  75mL OMNIPAQUE IOHEXOL 350 MG/ML SOLN COMPARISON:  12/01/2022 FINDINGS: Lower chest: No acute abnormality Hepatobiliary: Severe diffuse low-density throughout the liver compatible with fatty infiltration. No visible focal hepatic abnormality. Gallbladder unremarkable. Pancreas: No focal abnormality or ductal dilatation. Spleen: No focal abnormality.  Normal size. Adrenals/Urinary Tract: No adrenal abnormality. No focal renal abnormality. No stones or  hydronephrosis. Urinary bladder is unremarkable. Stomach/Bowel: Normal appendix. Stomach, large and small bowel grossly unremarkable. Vascular/Lymphatic: No evidence of aneurysm or adenopathy. Reproductive: No visible focal abnormality. Other: No free fluid or free air. Musculoskeletal: No acute bony abnormality. IMPRESSION: No acute findings in the abdomen or pelvis. Severe hepatic steatosis. Electronically Signed   By: Charlett Nose M.D.   On: 08/01/2023 16:02   DG Wrist Complete Left Result Date: 08/01/2023 CLINICAL DATA:  Left wrist pain after injury yesterday. EXAM: LEFT WRIST - COMPLETE 3+ VIEW COMPARISON:  None Available. FINDINGS: There is no evidence of fracture or dislocation. There is no evidence of arthropathy or other focal bone abnormality. Soft tissues are unremarkable. IMPRESSION: Negative. Electronically Signed  By: Lupita Raider M.D.   On: 08/01/2023 13:22   DG Chest Portable 1 View Result Date: 08/01/2023 CLINICAL DATA:  Chest pain. EXAM: PORTABLE CHEST 1 VIEW COMPARISON:  February 01, 2023. FINDINGS: The heart size and mediastinal contours are within normal limits. Both lungs are clear. The visualized skeletal structures are unremarkable. IMPRESSION: No active disease. Electronically Signed   By: Lupita Raider M.D.   On: 08/01/2023 13:02    Procedures Procedures    Medications Ordered in ED Medications  lactated ringers bolus 2,000 mL (has no administration in time range)  ondansetron (ZOFRAN) injection 4 mg (has no administration in time range)  pantoprazole (PROTONIX) injection 40 mg (has no administration in time range)  LORazepam (ATIVAN) injection 0-4 mg (has no administration in time range)    Or  LORazepam (ATIVAN) tablet 0-4 mg (has no administration in time range)  LORazepam (ATIVAN) injection 0-4 mg (has no administration in time range)    Or  LORazepam (ATIVAN) tablet 0-4 mg (has no administration in time range)  thiamine (VITAMIN B1) tablet 100 mg (has no  administration in time range)    Or  thiamine (VITAMIN B1) injection 100 mg (has no administration in time range)    ED Course/ Medical Decision Making/ A&P                                 Medical Decision Making Amount and/or Complexity of Data Reviewed Labs: ordered.  Risk OTC drugs. Prescription drug management.   Medical Decision Making / ED Course   This patient presents to the ED for concern of nausea, vomiting, this involves an extensive number of treatment options, and is a complaint that carries with it a high risk of complications and morbidity.  The differential diagnosis includes gastroenteritis, viral URI, colitis, UTI, pancreatitis  MDM: 58 year old male presents today for concern of nausea and vomiting.  He states since he was seen in the emergency department recently has not been able to tolerate much of any p.o. intake.  He denies any abdominal pain.  Exam was overall reassuring with soft and nontender abdomen.  Will provide conservative management and reevaluate.   Protonix, Zofran, fluid bolus given.  CIWA protocol ordered.  He was given 1 mg of Ativan.  However he denied any symptoms of withdrawal symptoms and his last drink was 3 days ago.  He is not tachycardic does not appear to be in overt withdrawals.  On reevaluation patient reports resolution of his symptoms.  He is tolerating p.o. intake.  Potassium repletion given.  CBC is unremarkable with the exception of platelets of 127,000.  UDS unremarkable, UA without evidence of UTI.  Ethanol level within normal.  Lipase minimally elevated not consistent with pericarditis levels.  He is also without abdominal tenderness.  Respiratory panel negative.  He had CT scan performed 2 days ago without acute intra-abdominal or pelvic findings.  He did have severe hepatic steatosis which would explain his elevated LFTs on metabolic panel today.  Discussed he needs to closely follow-up with his PCP.  Potassium 3.2.  P.o.  repletion given.  Sodium of 129.  Likely due to decreased p.o. today.  We discussed increasing his intake.  Patient discharged in stable condition.  Return precaution discussed.  Patient voices understanding and is in agreement with plan.   Additional history obtained: -Additional history obtained from recent ED visits -External records from outside source  obtained and reviewed including: Chart review including previous notes, labs, imaging, consultation notes   Lab Tests: -I ordered, reviewed, and interpreted labs.   The pertinent results include:   Labs Reviewed  LIPASE, BLOOD - Abnormal; Notable for the following components:      Result Value   Lipase 58 (*)    All other components within normal limits  COMPREHENSIVE METABOLIC PANEL - Abnormal; Notable for the following components:   Sodium 129 (*)    Potassium 3.2 (*)    CO2 21 (*)    Glucose, Bld 201 (*)    Creatinine, Ser 0.60 (*)    Calcium 8.7 (*)    AST 412 (*)    ALT 206 (*)    Total Bilirubin 1.3 (*)    All other components within normal limits  CBC - Abnormal; Notable for the following components:   Platelets 127 (*)    All other components within normal limits  URINALYSIS, ROUTINE W REFLEX MICROSCOPIC - Abnormal; Notable for the following components:   Color, Urine STRAW (*)    Specific Gravity, Urine 1.004 (*)    Glucose, UA 150 (*)    All other components within normal limits  RESP PANEL BY RT-PCR (RSV, FLU A&B, COVID)  RVPGX2  ETHANOL  RAPID URINE DRUG SCREEN, HOSP PERFORMED      EKG  EKG Interpretation Date/Time:    Ventricular Rate:    PR Interval:    QRS Duration:    QT Interval:    QTC Calculation:   R Axis:      Text Interpretation:          Medicines ordered and prescription drug management: Meds ordered this encounter  Medications   lactated ringers bolus 2,000 mL   ondansetron (ZOFRAN) injection 4 mg   pantoprazole (PROTONIX) injection 40 mg   OR Linked Order Group    LORazepam  (ATIVAN) injection 0-4 mg     CIWA-AR < 5 =:   0 mg     CIWA-AR 5 -10 =:   1 mg     CIWA-AR 11 -15 =:   2 mg     CIWA-AR 16 -20 =:   3 mg     CIWA-AR 16 -20 =:   Recheck CIWA-AR in 1 hour; if > 20 notify MD     CIWA-AR > 20 =:   4 mg     CIWA-AR > 20 =:   Notify MD    LORazepam (ATIVAN) tablet 0-4 mg     CIWA-AR < 5 =:   0 mg     CIWA-AR 5 -10 =:   1 mg     CIWA-AR 11 -15 =:   2 mg     CIWA-AR 16 -20 =:   3 mg     CIWA-AR 16 -20 =:   Recheck CIWA-AR in 1 hour; if > 20 notify MD     CIWA-AR > 20 =:   4 mg     CIWA-AR > 20 =:   Notify MD   OR Linked Order Group    LORazepam (ATIVAN) injection 0-4 mg     CIWA-AR < 5 =:   0 mg     CIWA-AR 5 -10 =:   1 mg     CIWA-AR 11 -15 =:   2 mg     CIWA-AR 16 -20 =:   3 mg     CIWA-AR 16 -20 =:   Recheck CIWA-AR in 1 hour; if >  20 notify MD     CIWA-AR > 20 =:   4 mg     CIWA-AR > 20 =:   Notify MD    LORazepam (ATIVAN) tablet 0-4 mg     CIWA-AR < 5 =:   0 mg     CIWA-AR 5 -10 =:   1 mg     CIWA-AR 11 -15 =:   2 mg     CIWA-AR 16 -20 =:   3 mg     CIWA-AR 16 -20 =:   Recheck CIWA-AR in 1 hour; if > 20 notify MD     CIWA-AR > 20 =:   4 mg     CIWA-AR > 20 =:   Notify MD   OR Linked Order Group    thiamine (VITAMIN B1) tablet 100 mg    thiamine (VITAMIN B1) injection 100 mg    -I have reviewed the patients home medicines and have made adjustments as needed  Cardiac Monitoring: The patient was maintained on a cardiac monitor.  I personally viewed and interpreted the cardiac monitored which showed an underlying rhythm of: Normal sinus rhythm  Social Determinants of Health:  Factors impacting patients care include: Has PCP follow-up   Reevaluation: After the interventions noted above, I reevaluated the patient and found that they have :improved  Co morbidities that complicate the patient evaluation  Past Medical History:  Diagnosis Date   Alcohol abuse    Chest pain    Dyslipidemia    Elevated liver enzymes    Gastritis     Hypertension       Dispostion: Discharged in stable condition.  Return precaution discussed.  Patient voices understanding and is in agreement with plan.   Final Clinical Impression(s) / ED Diagnoses Final diagnoses:  Nausea and vomiting, unspecified vomiting type    Rx / DC Orders ED Discharge Orders          Ordered    ondansetron (ZOFRAN-ODT) 4 MG disintegrating tablet  Every 8 hours PRN        08/03/23 1050              Marita Kansas, PA-C 08/03/23 1052    Derwood Kaplan, MD 08/03/23 1055

## 2023-08-03 NOTE — Discharge Instructions (Addendum)
 You had improvement in your symptoms after medications and fluids.  You are able to tolerate p.o. intake in the emergency department.  You are given a potassium supplement.  Please follow-up with your primary care doctor and have your potassium rechecked in the next 3 days.  If you have any worsening symptoms return to the emergency room.

## 2023-08-03 NOTE — ED Triage Notes (Addendum)
 Patient BIB [facility] c/o chills, fever, abdominal pain, and possible etoh withdrawals, last drank 2 days ago.   650 mg tylenol given by ems

## 2023-08-05 ENCOUNTER — Other Ambulatory Visit: Payer: Self-pay

## 2023-09-20 ENCOUNTER — Other Ambulatory Visit: Payer: Self-pay

## 2023-12-17 ENCOUNTER — Inpatient Hospital Stay (HOSPITAL_COMMUNITY)
Admission: EM | Admit: 2023-12-17 | Discharge: 2023-12-24 | DRG: 896 | Disposition: A | Discharge status: Home / Self Care | Payer: Self-pay | Attending: Internal Medicine | Admitting: Internal Medicine

## 2023-12-17 ENCOUNTER — Other Ambulatory Visit: Payer: Self-pay

## 2023-12-17 ENCOUNTER — Encounter (HOSPITAL_COMMUNITY): Payer: Self-pay

## 2023-12-17 ENCOUNTER — Emergency Department (HOSPITAL_COMMUNITY): Payer: Self-pay

## 2023-12-17 DIAGNOSIS — F10232 Alcohol dependence with withdrawal with perceptual disturbance: Principal | ICD-10-CM | POA: Diagnosis present

## 2023-12-17 DIAGNOSIS — E871 Hypo-osmolality and hyponatremia: Secondary | ICD-10-CM | POA: Diagnosis present

## 2023-12-17 DIAGNOSIS — F10230 Alcohol dependence with withdrawal, uncomplicated: Secondary | ICD-10-CM | POA: Diagnosis present

## 2023-12-17 DIAGNOSIS — Y906 Blood alcohol level of 120-199 mg/100 ml: Secondary | ICD-10-CM | POA: Diagnosis present

## 2023-12-17 DIAGNOSIS — N179 Acute kidney failure, unspecified: Secondary | ICD-10-CM | POA: Diagnosis present

## 2023-12-17 DIAGNOSIS — K760 Fatty (change of) liver, not elsewhere classified: Secondary | ICD-10-CM | POA: Diagnosis present

## 2023-12-17 DIAGNOSIS — K859 Acute pancreatitis without necrosis or infection, unspecified: Secondary | ICD-10-CM | POA: Diagnosis present

## 2023-12-17 DIAGNOSIS — E875 Hyperkalemia: Secondary | ICD-10-CM | POA: Diagnosis present

## 2023-12-17 DIAGNOSIS — F10930 Alcohol use, unspecified with withdrawal, uncomplicated: Principal | ICD-10-CM

## 2023-12-17 DIAGNOSIS — K861 Other chronic pancreatitis: Secondary | ICD-10-CM | POA: Diagnosis present

## 2023-12-17 DIAGNOSIS — G312 Degeneration of nervous system due to alcohol: Secondary | ICD-10-CM | POA: Diagnosis present

## 2023-12-17 DIAGNOSIS — F10939 Alcohol use, unspecified with withdrawal, unspecified: Secondary | ICD-10-CM | POA: Diagnosis present

## 2023-12-17 DIAGNOSIS — E1122 Type 2 diabetes mellitus with diabetic chronic kidney disease: Secondary | ICD-10-CM | POA: Diagnosis present

## 2023-12-17 DIAGNOSIS — Z7984 Long term (current) use of oral hypoglycemic drugs: Secondary | ICD-10-CM

## 2023-12-17 DIAGNOSIS — F10220 Alcohol dependence with intoxication, uncomplicated: Secondary | ICD-10-CM | POA: Diagnosis present

## 2023-12-17 DIAGNOSIS — G9341 Metabolic encephalopathy: Secondary | ICD-10-CM | POA: Diagnosis present

## 2023-12-17 DIAGNOSIS — E781 Pure hyperglyceridemia: Secondary | ICD-10-CM | POA: Diagnosis present

## 2023-12-17 DIAGNOSIS — E876 Hypokalemia: Secondary | ICD-10-CM | POA: Diagnosis present

## 2023-12-17 DIAGNOSIS — R197 Diarrhea, unspecified: Secondary | ICD-10-CM | POA: Diagnosis present

## 2023-12-17 DIAGNOSIS — D61818 Other pancytopenia: Secondary | ICD-10-CM | POA: Diagnosis present

## 2023-12-17 DIAGNOSIS — E860 Dehydration: Secondary | ICD-10-CM | POA: Diagnosis present

## 2023-12-17 DIAGNOSIS — E878 Other disorders of electrolyte and fluid balance, not elsewhere classified: Secondary | ICD-10-CM | POA: Diagnosis present

## 2023-12-17 DIAGNOSIS — I129 Hypertensive chronic kidney disease with stage 1 through stage 4 chronic kidney disease, or unspecified chronic kidney disease: Secondary | ICD-10-CM | POA: Diagnosis present

## 2023-12-17 DIAGNOSIS — D751 Secondary polycythemia: Secondary | ICD-10-CM | POA: Diagnosis present

## 2023-12-17 DIAGNOSIS — K219 Gastro-esophageal reflux disease without esophagitis: Secondary | ICD-10-CM | POA: Diagnosis present

## 2023-12-17 DIAGNOSIS — R443 Hallucinations, unspecified: Secondary | ICD-10-CM | POA: Diagnosis present

## 2023-12-17 DIAGNOSIS — N182 Chronic kidney disease, stage 2 (mild): Secondary | ICD-10-CM | POA: Diagnosis present

## 2023-12-17 DIAGNOSIS — R1310 Dysphagia, unspecified: Secondary | ICD-10-CM | POA: Diagnosis present

## 2023-12-17 DIAGNOSIS — F10231 Alcohol dependence with withdrawal delirium: Secondary | ICD-10-CM | POA: Diagnosis present

## 2023-12-17 DIAGNOSIS — Z87891 Personal history of nicotine dependence: Secondary | ICD-10-CM

## 2023-12-17 DIAGNOSIS — R079 Chest pain, unspecified: Secondary | ICD-10-CM | POA: Diagnosis present

## 2023-12-17 LAB — CBC WITH DIFFERENTIAL/PLATELET
Abs Granulocyte: 2.3 K/uL (ref 1.5–6.5)
Abs Immature Granulocytes: 0.02 K/uL (ref 0.00–0.07)
Basophils Absolute: 0 K/uL (ref 0.0–0.1)
Basophils Relative: 1 %
Eosinophils Absolute: 0 K/uL (ref 0.0–0.5)
Eosinophils Relative: 0 %
HCT: 34.3 % — ABNORMAL LOW (ref 39.0–52.0)
Hemoglobin: 12.1 g/dL — ABNORMAL LOW (ref 13.0–17.0)
Immature Granulocytes: 1 %
Lymphocytes Relative: 16 %
Lymphs Abs: 0.5 K/uL — ABNORMAL LOW (ref 0.7–4.0)
MCH: 30.3 pg (ref 26.0–34.0)
MCHC: 35.3 g/dL (ref 30.0–36.0)
MCV: 86 fL (ref 80.0–100.0)
Monocytes Absolute: 0.2 K/uL (ref 0.1–1.0)
Monocytes Relative: 6 %
Neutro Abs: 2.3 K/uL (ref 1.7–7.7)
Neutrophils Relative %: 76 %
Platelets: 102 K/uL — ABNORMAL LOW (ref 150–400)
RBC: 3.99 MIL/uL — ABNORMAL LOW (ref 4.22–5.81)
RDW: 14.5 % (ref 11.5–15.5)
WBC: 3 K/uL — ABNORMAL LOW (ref 4.0–10.5)
nRBC: 1.7 % — ABNORMAL HIGH (ref 0.0–0.2)

## 2023-12-17 LAB — ETHANOL: Alcohol, Ethyl (B): 149 mg/dL — ABNORMAL HIGH (ref <15)

## 2023-12-17 LAB — TROPONIN I (HIGH SENSITIVITY): Troponin I (High Sensitivity): 10 ng/L (ref <18)

## 2023-12-17 MED ORDER — LORAZEPAM 1 MG PO TABS
0.0000 mg | ORAL_TABLET | Freq: Four times a day (QID) | ORAL | Status: DC
  Administered 2023-12-18 – 2023-12-19 (×3): 2 mg via ORAL
  Filled 2023-12-17 (×5): qty 2

## 2023-12-17 MED ORDER — ONDANSETRON HCL 4 MG/2ML IJ SOLN
4.0000 mg | Freq: Once | INTRAMUSCULAR | Status: AC
  Administered 2023-12-17: 4 mg via INTRAVENOUS
  Filled 2023-12-17: qty 2

## 2023-12-17 MED ORDER — LORAZEPAM 2 MG/ML IJ SOLN: 0.0000 mg | Freq: Two times a day (BID) | INTRAMUSCULAR | Status: DC

## 2023-12-17 MED ORDER — LORAZEPAM 1 MG PO TABS
2.0000 mg | ORAL_TABLET | Freq: Once | ORAL | Status: DC
Start: 2023-12-17 — End: 2023-12-17

## 2023-12-17 MED ORDER — THIAMINE MONONITRATE 100 MG PO TABS
100.0000 mg | ORAL_TABLET | Freq: Every day | ORAL | Status: DC
  Administered 2023-12-19 – 2023-12-24 (×6): 100 mg via ORAL
  Filled 2023-12-17 (×6): qty 1

## 2023-12-17 MED ORDER — THIAMINE HCL 100 MG/ML IJ SOLN
100.0000 mg | Freq: Every day | INTRAMUSCULAR | Status: DC
  Administered 2023-12-18: 100 mg via INTRAVENOUS
  Filled 2023-12-17: qty 2

## 2023-12-17 MED ORDER — LORAZEPAM 2 MG/ML IJ SOLN
0.0000 mg | Freq: Four times a day (QID) | INTRAMUSCULAR | Status: DC
  Administered 2023-12-17: 4 mg via INTRAVENOUS
  Administered 2023-12-18: 2 mg via INTRAVENOUS
  Filled 2023-12-17: qty 1
  Filled 2023-12-17 (×2): qty 2

## 2023-12-17 MED ORDER — LORAZEPAM 2 MG/ML IJ SOLN
2.0000 mg | Freq: Once | INTRAMUSCULAR | Status: AC
  Administered 2023-12-17: 2 mg via INTRAVENOUS
  Filled 2023-12-17: qty 1

## 2023-12-17 MED ORDER — SODIUM CHLORIDE 0.9 % IV BOLUS
1000.0000 mL | Freq: Once | INTRAVENOUS | Status: AC
  Administered 2023-12-17: 1000 mL via INTRAVENOUS

## 2023-12-17 MED ORDER — LORAZEPAM 1 MG PO TABS: 0.0000 mg | ORAL_TABLET | Freq: Two times a day (BID) | ORAL | Status: DC

## 2023-12-17 NOTE — ED Provider Triage Note (Signed)
 Emergency Medicine Provider Triage Evaluation Note  Derek Cruz , a 58 y.o. male  was evaluated in triage.  Pt complains of vomiting, diarrhea, abdominal pain.  He also has chest pain.  All the symptoms have been 3 days.  Review of Systems  Positive: Chest pain, abdominal pain, vomiting, diarrhea Negative: Fever  Physical Exam  BP (!) 117/106 (BP Location: Right Arm)   Pulse (!) 110   Temp 98.6 F (37 C) (Oral)   Resp 16   Ht 5' 5 (1.651 m)   Wt 76.2 kg   SpO2 97%   BMI 27.96 kg/m  Gen:   Awake, alert Resp:  Normal effort  Abd:   Mild upper abdominal tenderness   Medical Decision Making  Medically screening exam initiated at 1:09 PM.  Appropriate orders placed.  Derek Cruz was informed that the remainder of the evaluation will be completed by another provider, this initial triage assessment does not replace that evaluation, and the importance of remaining in the ED until their evaluation is complete.  Will order labs, Zofran , chest x-ray and ECG   Freddi Hamilton, MD 12/17/23 1310

## 2023-12-17 NOTE — ED Notes (Signed)
 Pt brought into triage for redraw of labs. Pt with severe tremor, vomiting. Last drink was yesterday. Ciwa 12. Gieven ativan . CN aware. Taken to bed

## 2023-12-17 NOTE — ED Triage Notes (Signed)
 Pt n/v/d for 3 days. C/O CP for 3 days. VSS. C/O abd pain and SHOB. Axox4

## 2023-12-17 NOTE — ED Provider Notes (Signed)
 [place] EMERGENCY DEPARTMENT AT [facility] Provider Note   CSN: [id number] Arrival date & time: 12/17/23  1225     Patient presents with: Withdrawal   Derek Cruz is a 58 y.o. male.   The history is provided by the patient and medical records.   58 year old male with history of alcohol  abuse, CKD, hypertension, hyperlipidemia, GERD, presenting to the ED for nausea, vomiting, and diarrhea x 3 days.  States he generally feels unwell.  States because of his symptoms he has been drinking less alcohol  than normal.  Started to have tremors yesterday, worse today.  He states this tends to happen when he does not drink alcohol .  He has had DTs in the past per his report. Normally drinks 4-5 tall boys (25oz beers). He is interested in stopping drinking completely.  Also has some right upper quadrant abdominal pain.  States he just feels very achy.  Denies fever.  Denies prior abdominal surgeries.  Prior to Admission medications   Medication Sig Start Date End Date Taking? Authorizing Provider  chlordiazePOXIDE  (LIBRIUM ) 25 MG capsule Take 2 capsules (50mg ) by mouth 3 times a day for 1 day,  then 1-2 capsules (25-50mg ) twice a day for 1 day, then 1-2 capsules (25-50mg ) daily for 1 day 02/01/23   Yolande Lamar BROCKS, MD  folic acid  (FOLVITE ) 1 MG tablet Take 1 tablet (1 mg total) by mouth daily. 12/06/22   Elgergawy, Dawood S, MD  Melatonin 1 MG CHEW Chew 1 tablet by mouth at bedtime as needed. 08/03/23   Hildegard Loge, PA-C  metFORMIN  (GLUCOPHAGE -XR) 750 MG 24 hr tablet Take 2 tablets (1,500 mg total) by mouth daily with breakfast. Patient not taking: Reported on 12/08/2022 12/06/22 01/05/23  Elgergawy, Brayton RAMAN, MD  Multiple Vitamin (MULTIVITAMIN WITH MINERALS) TABS tablet Take 1 tablet by mouth daily. 12/06/22   Elgergawy, Brayton RAMAN, MD  ondansetron  (ZOFRAN -ODT) 4 MG disintegrating tablet Take 1 tablet (4 mg total) by mouth every 8 (eight) hours as needed. 08/03/23   Hildegard Loge,  PA-C  pantoprazole  (PROTONIX ) 20 MG tablet Take 1 tablet (20 mg total) by mouth daily for 14 days. 08/01/23 08/16/23  Elnor Jayson LABOR, DO  potassium chloride  SA (KLOR-CON  M) 20 MEQ tablet Take 1 tablet (20 mEq total) by mouth daily for 5 days. 08/01/23 08/07/23  Elnor Jayson LABOR, DO  sucralfate  (CARAFATE ) 1 g tablet Take 1 tablet (1 g total) by mouth with breakfast, with lunch, and with evening meal for 7 days. 08/01/23 08/09/23  Elnor Jayson LABOR, DO  thiamine  (VITAMIN B1) 100 MG tablet Take 1 tablet (100 mg total) by mouth daily. 12/10/22   Elgergawy, Brayton RAMAN, MD    Allergies: Patient has no known allergies.    Review of Systems  Gastrointestinal:  Positive for diarrhea, nausea and vomiting.  All other systems reviewed and are negative.   Updated Vital Signs BP (!) 151/103   Pulse (!) 105   Temp 98.2 F (36.8 C)   Resp 12   Ht 5' 5 (1.651 m)   Wt 76.2 kg   SpO2 99%   BMI 27.96 kg/m   Physical Exam Vitals and nursing note reviewed.  Constitutional:      Appearance: He is well-developed.  HENT:     Head: Normocephalic and atraumatic.  Eyes:     General: Scleral icterus present.     Conjunctiva/sclera: Conjunctivae normal.     Pupils: Pupils are equal, round, and reactive to light.  Comments: Scleral icterus noted  Cardiovascular:     Rate and Rhythm: Normal rate and regular rhythm.     Heart sounds: Normal heart sounds.  Pulmonary:     Effort: Pulmonary effort is normal.     Breath sounds: Normal breath sounds.  Abdominal:     General: Bowel sounds are normal.     Palpations: Abdomen is soft.     Tenderness: There is abdominal tenderness in the right upper quadrant.     Comments: Mild tenderness RUQ, no murphy's sign  Musculoskeletal:        General: Normal range of motion.     Cervical back: Normal range of motion.  Skin:    General: Skin is warm and dry.  Neurological:     Mental Status: He is alert and oriented to person, place, and time.     Comments: AAOx3, very  tremulous on exam, moving extremities without focal deficit     (all labs ordered are listed, but only abnormal results are displayed) Labs Reviewed  ETHANOL - Abnormal; Notable for the following components:      Result Value   Alcohol , Ethyl (B) 149 (*)    All other components within normal limits  CBC WITH DIFFERENTIAL/PLATELET - Abnormal; Notable for the following components:   WBC 3.0 (*)    RBC 3.99 (*)    Hemoglobin 12.1 (*)    HCT 34.3 (*)    Platelets 102 (*)    nRBC 1.7 (*)    Lymphs Abs 0.5 (*)    All other components within normal limits  COMPREHENSIVE METABOLIC PANEL WITH GFR - Abnormal; Notable for the following components:   Sodium 107 (*)    Chloride 72 (*)    CO2 19 (*)    Glucose, Bld 193 (*)    Creatinine, Ser <0.30 (*)    Calcium  7.4 (*)    Albumin 3.1 (*)    Total Bilirubin 11.6 (*)    Anion gap 16 (*)    All other components within normal limits  I-STAT CHEM 8, ED - Abnormal; Notable for the following components:   Sodium 115 (*)    Potassium 6.5 (*)    Chloride 87 (*)    Glucose, Bld 192 (*)    Calcium , Ion 0.78 (*)    All other components within normal limits  MRSA NEXT GEN BY PCR, NASAL  LIPASE, BLOOD  URINALYSIS, ROUTINE W REFLEX MICROSCOPIC  COMPREHENSIVE METABOLIC PANEL WITH GFR  HIV ANTIBODY (ROUTINE TESTING W REFLEX)  CBC  BASIC METABOLIC PANEL WITH GFR  MAGNESIUM   PHOSPHORUS  SODIUM  SODIUM  SODIUM  SODIUM  SODIUM  TROPONIN I (HIGH SENSITIVITY)    EKG: None  Radiology: DG Chest 2 View Result Date: 12/17/2023 CLINICAL DATA:  Chest pain and vomiting. EXAM: CHEST - 2 VIEW COMPARISON:  Chest radiograph dated 08/01/2023. FINDINGS: The heart size and mediastinal contours are within normal limits. Both lungs are clear. The visualized skeletal structures are unremarkable. IMPRESSION: No active cardiopulmonary disease. Electronically Signed   By: Vanetta Chou M.D.   On: 12/17/2023 14:16     Procedures   CRITICAL CARE Performed  by: Olam CHRISTELLA Slocumb   Total critical care time: 45 minutes  Critical care time was exclusive of separately billable procedures and treating other patients.  Critical care was necessary to treat or prevent imminent or life-threatening deterioration.  Critical care was time spent personally by me on the following activities: development of treatment plan with patient and/or  surrogate as well as nursing, discussions with consultants, evaluation of patient's response to treatment, examination of patient, obtaining history from patient or surrogate, ordering and performing treatments and interventions, ordering and review of laboratory studies, ordering and review of radiographic studies, pulse oximetry and re-evaluation of patient's condition.   Medications Ordered in the ED  LORazepam  (ATIVAN ) injection 0-4 mg (4 mg Intravenous Given 12/17/23 2357)    Or  LORazepam  (ATIVAN ) tablet 0-4 mg ( Oral See Alternative 12/17/23 2357)  LORazepam  (ATIVAN ) injection 0-4 mg (has no administration in time range)    Or  LORazepam  (ATIVAN ) tablet 0-4 mg (has no administration in time range)  thiamine  (VITAMIN B1) tablet 100 mg (has no administration in time range)    Or  thiamine  (VITAMIN B1) injection 100 mg (has no administration in time range)  PHENobarbital  (LUMINAL) tablet 97.2 mg (has no administration in time range)    Followed by  PHENobarbital  (LUMINAL) tablet 64.8 mg (has no administration in time range)    Followed by  PHENobarbital  (LUMINAL) tablet 32.4 mg (has no administration in time range)  PHENObarbital  (LUMINAL) injection 130 mg (has no administration in time range)  heparin  injection 5,000 Units (has no administration in time range)  Chlorhexidine  Gluconate Cloth 2 % PADS 6 each (has no administration in time range)  docusate sodium  (COLACE) capsule 100 mg (has no administration in time range)  polyethylene glycol (MIRALAX  / GLYCOLAX ) packet 17 g (has no administration in time range)   0.9 %  sodium chloride  infusion (has no administration in time range)  insulin  aspart (novoLOG ) injection 10 Units (has no administration in time range)  dextrose  50 % solution 50 mL (has no administration in time range)  calcium  gluconate 2 g/ 100 mL sodium chloride  IVPB (has no administration in time range)  ondansetron  (ZOFRAN ) injection 4 mg (4 mg Intravenous Given 12/17/23 1315)  ondansetron  (ZOFRAN ) injection 4 mg (4 mg Intravenous Given 12/17/23 1748)  LORazepam  (ATIVAN ) injection 2 mg (2 mg Intravenous Given 12/17/23 2222)  sodium chloride  0.9 % bolus 1,000 mL (1,000 mLs Intravenous New Bag/Given 12/17/23 2359)                                    Medical Decision Making Amount and/or Complexity of Data Reviewed Labs: ordered. Radiology: ordered and independent interpretation performed. ECG/medicine tests: ordered and independent interpretation performed.  Risk OTC drugs. Prescription drug management. Decision regarding hospitalization.   58 y.o. M here with alcohol  withdrawal.  Has had N/V/D x3 days so drinking alcohol  less than normal.  Usually drinks 4-5 tall boys (25oz beers).  Had prolonged wait in the waiting room due to high census, waiting 10+ hours prior to my evaluation.   He is awake, alert without focal deficit.  He is very tremulous.  He is tachycardic and hypertensive as well.  CBC without leukocytosis or profound anemia.  Ethanol 149, again drawn about 10 hours prior to my assessment so clinically sober at this time.  Will initiate CIWA protocol, IV fluids, thiamine  replacement.  12:35 AM Delay in care due to labs.  Patient has been here 12+ hours and still unable to get a chemistry reading.  I called and spoke to lab, Kevin--most sample which was fifth sample collected since his arrival in the ED is also hemolyzed and only partially able to result.  Unclear why this continues happening.  Will get chem-8 to expedite care as  he will need admission for alcohol   withdrawal.  CMP with Na+ of 105.  Chem-8 at 115.  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    Discussed with ICU, they will evaluate in the ED.  ICU admitting for further management.  Final diagnoses:  Alcohol  withdrawal syndrome without complication [facility])    ED Discharge Orders     None          Jarold Olam HERO, PA-C 12/18/23 KIN Charlyn Sora, MD 12/22/23 [phone]

## 2023-12-17 NOTE — ED Notes (Signed)
 Patient transported to Ultrasound

## 2023-12-18 DIAGNOSIS — E878 Other disorders of electrolyte and fluid balance, not elsewhere classified: Secondary | ICD-10-CM

## 2023-12-18 DIAGNOSIS — F10930 Alcohol use, unspecified with withdrawal, uncomplicated: Secondary | ICD-10-CM

## 2023-12-18 DIAGNOSIS — E871 Hypo-osmolality and hyponatremia: Secondary | ICD-10-CM

## 2023-12-18 DIAGNOSIS — E875 Hyperkalemia: Secondary | ICD-10-CM

## 2023-12-18 DIAGNOSIS — F10131 Alcohol abuse with withdrawal delirium: Secondary | ICD-10-CM

## 2023-12-18 DIAGNOSIS — G9341 Metabolic encephalopathy: Secondary | ICD-10-CM

## 2023-12-18 LAB — COMPREHENSIVE METABOLIC PANEL WITH GFR
Albumin: 3.1 g/dL — ABNORMAL LOW (ref 3.5–5.0)
Albumin: 2.5 g/dL — ABNORMAL LOW (ref 3.5–5.0)
Anion gap: 16 — ABNORMAL HIGH (ref 5–15)
Anion gap: 14 (ref 5–15)
BUN: 10 mg/dL (ref 6–20)
BUN: 7 mg/dL (ref 6–20)
CO2: 19 mmol/L — ABNORMAL LOW (ref 22–32)
CO2: 20 mmol/L — ABNORMAL LOW (ref 22–32)
Calcium: 7.4 mg/dL — ABNORMAL LOW (ref 8.9–10.3)
Calcium: 7.6 mg/dL — ABNORMAL LOW (ref 8.9–10.3)
Chloride: 72 mmol/L — ABNORMAL LOW (ref 98–111)
Chloride: 83 mmol/L — ABNORMAL LOW (ref 98–111)
Creatinine, Ser: <0.3 mg/dL — ABNORMAL LOW (ref 0.61–1.24)
Creatinine, Ser: 0.37 mg/dL — ABNORMAL LOW (ref 0.61–1.24)
GFR, Estimated: >60 mL/min (ref >60)
Glucose, Bld: 193 mg/dL — ABNORMAL HIGH (ref 70–99)
Glucose, Bld: 193 mg/dL — ABNORMAL HIGH (ref 70–99)
Sodium: 107 mmol/L — CL (ref 135–145)
Sodium: 117 mmol/L — CL (ref 135–145)
Total Bilirubin: 11.6 mg/dL — ABNORMAL HIGH (ref 0.0–1.2)

## 2023-12-18 LAB — URINALYSIS, ROUTINE W REFLEX MICROSCOPIC
Bacteria, UA: NONE SEEN
Bilirubin Urine: NEGATIVE
Glucose, UA: >=500 mg/dL — AB
Hgb urine dipstick: NEGATIVE
Ketones, ur: NEGATIVE mg/dL
Leukocytes,Ua: NEGATIVE
Nitrite: NEGATIVE
Protein, ur: NEGATIVE mg/dL
Specific Gravity, Urine: 1.006 (ref 1.005–1.030)
pH: 6 (ref 5.0–8.0)

## 2023-12-18 LAB — GLUCOSE, CAPILLARY
Glucose-Capillary: 270 mg/dL — ABNORMAL HIGH (ref 70–99)
Glucose-Capillary: 136 mg/dL — ABNORMAL HIGH (ref 70–99)
Glucose-Capillary: 197 mg/dL — ABNORMAL HIGH (ref 70–99)
Glucose-Capillary: 280 mg/dL — ABNORMAL HIGH (ref 70–99)
Glucose-Capillary: 326 mg/dL — ABNORMAL HIGH (ref 70–99)

## 2023-12-18 LAB — POCT I-STAT 7, (LYTES, BLD GAS, ICA,H+H)
Acid-Base Excess: 7 mmol/L — ABNORMAL HIGH (ref 0.0–2.0)
Bicarbonate: 29.5 mmol/L — ABNORMAL HIGH (ref 20.0–28.0)
Calcium, Ion: 1.15 mmol/L (ref 1.15–1.40)
HCT: 42 % (ref 39.0–52.0)
Hemoglobin: 14.3 g/dL (ref 13.0–17.0)
O2 Saturation: 97 %
Patient temperature: 98
Potassium: 2.6 mmol/L — CL (ref 3.5–5.1)
Sodium: 124 mmol/L — ABNORMAL LOW (ref 135–145)
TCO2: 31 mmol/L (ref 22–32)
pCO2 arterial: 33.9 mmHg (ref 32–48)
pH, Arterial: 7.546 — ABNORMAL HIGH (ref 7.35–7.45)
pO2, Arterial: 73 mmHg — ABNORMAL LOW (ref 83–108)

## 2023-12-18 LAB — BLOOD GAS, VENOUS
Acid-Base Excess: 8.8 mmol/L — ABNORMAL HIGH (ref 0.0–2.0)
Bicarbonate: 31.3 mmol/L — ABNORMAL HIGH (ref 20.0–28.0)
Drawn by: 8306
O2 Saturation: 89.7 %
Patient temperature: 36.8
pCO2, Ven: 35 mmHg — ABNORMAL LOW (ref 44–60)
pH, Ven: 7.56 — ABNORMAL HIGH (ref 7.25–7.43)
pO2, Ven: 52 mmHg — ABNORMAL HIGH (ref 32–45)

## 2023-12-18 LAB — I-STAT CHEM 8, ED
BUN: 13 mg/dL (ref 6–20)
Calcium, Ion: 0.78 mmol/L — CL (ref 1.15–1.40)
Chloride: 87 mmol/L — ABNORMAL LOW (ref 98–111)
Creatinine, Ser: 0.7 mg/dL (ref 0.61–1.24)
Glucose, Bld: 192 mg/dL — ABNORMAL HIGH (ref 70–99)
HCT: 49 % (ref 39.0–52.0)
Hemoglobin: 16.7 g/dL (ref 13.0–17.0)
Potassium: 6.5 mmol/L (ref 3.5–5.1)
Sodium: 115 mmol/L — CL (ref 135–145)
TCO2: 26 mmol/L (ref 22–32)

## 2023-12-18 LAB — SODIUM
Sodium: 117 mmol/L — CL (ref 135–145)
Sodium: 120 mmol/L — ABNORMAL LOW (ref 135–145)
Sodium: 119 mmol/L — CL (ref 135–145)

## 2023-12-18 LAB — T4, FREE

## 2023-12-18 LAB — CBC
HCT: 36.5 % — ABNORMAL LOW (ref 39.0–52.0)
Hemoglobin: 13.1 g/dL (ref 13.0–17.0)
MCH: 30.9 pg (ref 26.0–34.0)
MCHC: 35.9 g/dL (ref 30.0–36.0)
MCV: 86.1 fL (ref 80.0–100.0)
Platelets: 157 K/uL (ref 150–400)
RBC: 4.24 MIL/uL (ref 4.22–5.81)
RDW: 14.2 % (ref 11.5–15.5)
WBC: 4.7 K/uL (ref 4.0–10.5)
nRBC: 0.6 % — ABNORMAL HIGH (ref 0.0–0.2)

## 2023-12-18 LAB — HEMOGLOBIN A1C
Hgb A1c MFr Bld: 9 % — ABNORMAL HIGH (ref 4.8–5.6)
Mean Plasma Glucose: 211.6 mg/dL

## 2023-12-18 LAB — BILIRUBIN, DIRECT

## 2023-12-18 LAB — PHOSPHORUS

## 2023-12-18 LAB — TSH: TSH: 1.64 u[IU]/mL (ref 0.350–4.500)

## 2023-12-18 LAB — TRIGLYCERIDES: Triglycerides: >5000 mg/dL — ABNORMAL HIGH (ref <150)

## 2023-12-18 LAB — GAMMA GT

## 2023-12-18 LAB — MAGNESIUM: Magnesium: 3 mg/dL — ABNORMAL HIGH (ref 1.7–2.4)

## 2023-12-18 LAB — LIPASE, BLOOD: Lipase: 43 U/L (ref 11–51)

## 2023-12-18 LAB — HIV ANTIBODY (ROUTINE TESTING W REFLEX): HIV Screen 4th Generation wRfx: NONREACTIVE

## 2023-12-18 LAB — VITAMIN B12: Vitamin B-12: 2247 pg/mL — ABNORMAL HIGH (ref 180–914)

## 2023-12-18 LAB — MRSA NEXT GEN BY PCR, NASAL: MRSA by PCR Next Gen: NOT DETECTED

## 2023-12-18 MED ORDER — SODIUM CHLORIDE 0.9 % IV SOLN: INTRAVENOUS | Status: DC

## 2023-12-18 MED ORDER — DEXTROSE 5 % IV SOLN: INTRAVENOUS | Status: DC

## 2023-12-18 MED ORDER — PHENOBARBITAL 32.4 MG PO TABS: 64.8000 mg | ORAL_TABLET | Freq: Three times a day (TID) | ORAL | Status: DC

## 2023-12-18 MED ORDER — FOLIC ACID 1 MG PO TABS
1.0000 mg | ORAL_TABLET | Freq: Every day | ORAL | Status: DC
  Administered 2023-12-18 – 2023-12-24 (×7): 1 mg via ORAL
  Filled 2023-12-18 (×7): qty 1

## 2023-12-18 MED ORDER — INSULIN ASPART 100 UNIT/ML IJ SOLN
0.0000 [IU] | Freq: Three times a day (TID) | INTRAMUSCULAR | Status: DC
  Administered 2023-12-18: 1 [IU] via SUBCUTANEOUS
  Administered 2023-12-18: 3 [IU] via SUBCUTANEOUS

## 2023-12-18 MED ORDER — LORAZEPAM 2 MG/ML IJ SOLN: 1.0000 mg | INTRAMUSCULAR | Status: DC | PRN

## 2023-12-18 MED ORDER — PHENOBARBITAL 32.4 MG PO TABS: 32.4000 mg | ORAL_TABLET | Freq: Three times a day (TID) | ORAL | Status: DC

## 2023-12-18 MED ORDER — CHLORHEXIDINE GLUCONATE CLOTH 2 % EX PADS
6.0000 | MEDICATED_PAD | Freq: Every day | CUTANEOUS | Status: DC
  Administered 2023-12-18 – 2023-12-24 (×6): 6 via TOPICAL

## 2023-12-18 MED ORDER — SODIUM ZIRCONIUM CYCLOSILICATE 10 G PO PACK
10.0000 g | PACK | Freq: Once | ORAL | Status: AC
  Administered 2023-12-18: 10 g via ORAL
  Filled 2023-12-18: qty 1

## 2023-12-18 MED ORDER — PHENOBARBITAL SODIUM 130 MG/ML IJ SOLN
130.0000 mg | Freq: Once | INTRAMUSCULAR | Status: AC
  Administered 2023-12-18: 130 mg via INTRAVENOUS
  Filled 2023-12-18: qty 1

## 2023-12-18 MED ORDER — HEPARIN SODIUM (PORCINE) 5000 UNIT/ML IJ SOLN
5000.0000 [IU] | Freq: Three times a day (TID) | INTRAMUSCULAR | Status: DC
  Administered 2023-12-18 – 2023-12-24 (×17): 5000 [IU] via SUBCUTANEOUS
  Filled 2023-12-18 (×16): qty 1

## 2023-12-18 MED ORDER — ENSURE PLUS HIGH PROTEIN PO LIQD
237.0000 mL | Freq: Two times a day (BID) | ORAL | Status: DC
  Administered 2023-12-19 – 2023-12-23 (×8): 237 mL via ORAL

## 2023-12-18 MED ORDER — PANTOPRAZOLE SODIUM 40 MG PO TBEC
40.0000 mg | DELAYED_RELEASE_TABLET | Freq: Every day | ORAL | Status: DC
  Administered 2023-12-18 – 2023-12-24 (×6): 40 mg via ORAL
  Filled 2023-12-18 (×6): qty 1

## 2023-12-18 MED ORDER — FAMOTIDINE IN NACL 20-0.9 MG/50ML-% IV SOLN
20.0000 mg | INTRAVENOUS | Status: DC
  Administered 2023-12-18: 20 mg via INTRAVENOUS
  Filled 2023-12-18: qty 50

## 2023-12-18 MED ORDER — POLYETHYLENE GLYCOL 3350 17 G PO PACK: 17.0000 g | PACK | Freq: Every day | ORAL | Status: DC | PRN

## 2023-12-18 MED ORDER — POTASSIUM CHLORIDE 10 MEQ/100ML IV SOLN
10.0000 meq | INTRAVENOUS | Status: AC
  Administered 2023-12-18 (×4): 10 meq via INTRAVENOUS
  Filled 2023-12-18 (×4): qty 100

## 2023-12-18 MED ORDER — DOCUSATE SODIUM 100 MG PO CAPS: 100.0000 mg | ORAL_CAPSULE | Freq: Two times a day (BID) | ORAL | Status: DC | PRN

## 2023-12-18 MED ORDER — DEXTROSE 50 % IV SOLN
50.0000 mL | Freq: Once | INTRAVENOUS | Status: AC
  Administered 2023-12-18: 50 mL via INTRAVENOUS
  Filled 2023-12-18: qty 50

## 2023-12-18 MED ORDER — ORAL CARE MOUTH RINSE: 15.0000 mL | OROMUCOSAL | Status: DC | PRN

## 2023-12-18 MED ORDER — PHENOBARBITAL 32.4 MG PO TABS
97.2000 mg | ORAL_TABLET | Freq: Three times a day (TID) | ORAL | Status: DC
  Administered 2023-12-18: 97.2 mg via ORAL
  Filled 2023-12-18: qty 3

## 2023-12-18 MED ORDER — INSULIN ASPART 100 UNIT/ML IJ SOLN
10.0000 [IU] | Freq: Once | INTRAMUSCULAR | Status: AC
  Administered 2023-12-18: 10 [IU] via INTRAVENOUS

## 2023-12-18 MED ORDER — CALCIUM GLUCONATE-NACL 2-0.675 GM/100ML-% IV SOLN
2.0000 g | Freq: Once | INTRAVENOUS | Status: AC
  Administered 2023-12-18: 2000 mg via INTRAVENOUS
  Filled 2023-12-18 (×2): qty 100

## 2023-12-18 MED ORDER — ADULT MULTIVITAMIN W/MINERALS CH
1.0000 | ORAL_TABLET | Freq: Every day | ORAL | Status: DC
  Administered 2023-12-18 – 2023-12-24 (×7): 1 via ORAL
  Filled 2023-12-18 (×6): qty 1

## 2023-12-18 NOTE — Progress Notes (Signed)
 Initial Nutrition Assessment  DOCUMENTATION CODES:  Not applicable  INTERVENTION:  Continue regular diet as ordered  Monitor blood sugars and necessity for carb modified diet  Ensure Plus High Protein po BID, each supplement provides 350 kcal and 20 grams of protein.  Continue folvite  and thiamine ; add MVI with minerals daily in setting of EtOH use  Carbohydrate Counting for People with Diabetes handout in Spanish added to AVS  NUTRITION DIAGNOSIS:  Inadequate oral intake related to acute illness, nausea, vomiting as evidenced by per patient/family report.  GOAL:  Patient will meet greater than or equal to 90% of their needs  MONITOR:  PO intake, Supplement acceptance, Labs, Weight trends  REASON FOR ASSESSMENT:  Consult Assessment of nutrition requirement/status  ASSESSMENT:  Pt admitted with c/o 3 days of n/v/d d/t EtOH withdrawal. PMH significant alcohol  abuse, HTN, fatty liver, CKD.  Spoke with patient at bedside. He reports that he typically eats well. Recalling that he enjoys a variety of foods. Pt states that he usually eats 3 meals per day with the exception of the last 2 days. He endorses having eaten a few cookies and water yesterday but has been unable to tolerating any oral intake d/t n/v/d.   Pt mentions that he usually consumed ~3-4 beers daily. Noted in H&P report of  typical consumption of  4-5 tall boy sized (25 oz each) beers daily.   Pt is amenable to vitamin regimen in setting of EtOH use. Will also add nutrition supplements until nutritional adequacy established given poor oral intake x2 days and ETOH use.   Reviewed weight history on file. Unfortunately there is limited weight history within the last year to assess however it appears that his weight has declined from ~76.2 kg down to 69.2 kg within the last year though timeframe unclear. Will continue to monitor throughout admission.   Medications: folvite , thiamine   Labs:  Sodium 117 Cr  0.37 Ionized calcium  0.78 Magnesium  3.0 Albumin 2.5 HgbA1c 9.0% CBG's 136, 270 x4 hours  NUTRITION - FOCUSED PHYSICAL EXAM: Flowsheet Row Most Recent Value  Orbital Region No depletion  Upper Arm Region No depletion  Thoracic and Lumbar Region No depletion  Buccal Region No depletion  Temple Region No depletion  Clavicle Bone Region No depletion  Clavicle and Acromion Bone Region No depletion  Scapular Bone Region No depletion  Dorsal Hand Mild depletion  Patellar Region Moderate depletion  Anterior Thigh Region Moderate depletion  Posterior Calf Region Unable to assess  Edema (RD Assessment) None  Hair Reviewed  Eyes Reviewed  Mouth Reviewed  Skin Reviewed  Nails Reviewed    Diet Order:   Diet Order             Diet regular Room service appropriate? Yes; Fluid consistency: Thin  Diet effective now                   EDUCATION NEEDS:   Education needs have been addressed  Skin:  Skin Assessment: Reviewed RN Assessment  Last BM:  7/29  Height:   Ht Readings from Last 1 Encounters:  12/17/23 5' 5 (1.651 m)    Weight:   Wt Readings from Last 1 Encounters:  12/18/23 69.2 kg   BMI:  Body mass index is 25.39 kg/m.  Estimated Nutritional Needs:   Kcal:  1800-2000  Protein:  90-105g  Fluid:  >/=1.8L   Allie Stanly Si, RDN, LDN Clinical Nutrition See [facility] for contact information.

## 2023-12-18 NOTE — TOC CAGE-AID Note (Signed)
 Transition of Care [facility]) - CAGE-AID Screening   Patient Details  Name: Derek Cruz MRN: [id number] Date of Birth: 1965/10/14  [facility]) CM/SW Contact:    [name], [name], RN Phone Number: 12/18/2023, 2:56 PM   Clinical Narrative:  Cage-aid done patient willing to quit drinking Alcohol . Educational materials on AVS.     CAGE-AID Screening:    Have You Ever Felt You Ought to Cut Down on Your Drinking or Drug Use?: Yes Have People Annoyed You By Critizing Your Drinking Or Drug Use?: Yes Have You Felt Bad Or Guilty About Your Drinking Or Drug Use?: Yes Have You Ever Had a Drink or Used Drugs First Thing In The Morning to Steady Your Nerves or to Get Rid of a Hangover?: Yes CAGE-AID Score: 4  Substance Abuse Education Offered: Yes  Substance abuse interventions: Transport planner

## 2023-12-18 NOTE — TOC CM/SW Note (Signed)
 Transition of Care [facility]) - Inpatient Brief Assessment   Patient Details  Name: Derek Cruz MRN: [id number] Date of Birth: Jun 26, 1965  Transition [facility]) CM/SW Contact:    [name], [name], RN Phone Number: 12/18/2023, 2:48 PM   Clinical Narrative:  Patient presented to the ED with Abdominal pain, Dyspnea and N/V/D x3 days. Patient states he drinks 4-5 25 oz beers daily. Patient is being treated for Alcohol  withdrawals.   From home with a friend. Has three supportive children. Parents are in [place], has seven siblings, some in [place]. Not currently employed, does not have Aeronautical engineer.  PCP is Lorren Greig PARAS, NP and uses [facility] on [address]   Patient not Medically ready for discharge.  CM will continue to follow as patient progresses with care towards discharge.             Transition of Care Asessment: Insurance and Status: Insurance coverage has been reviewed (Patient does not have Aeronautical engineer) Patient has primary care physician: Yes Home environment has been reviewed: Yes Prior level of function:: Independent Prior/Current Home Services: No current home services Social Drivers of Health Review: SDOH reviewed no interventions necessary Readmission risk has been reviewed: Yes Transition of care needs: transition of care needs identified, TOC will continue to follow

## 2023-12-18 NOTE — ED Notes (Signed)
Critical care MD at the bedside. 

## 2023-12-18 NOTE — Progress Notes (Addendum)
 eLink Physician-Brief Progress Note Patient Name: Derek Cruz DOB: 1965/05/25 MRN: [id number]   Date of Service  12/18/2023  HPI/Events of Note  58 y.o. male who has a PMH including but not limited to alcohol  abuse, hypertension, fatty liver, CKD who presents with some concern for early Dts.  Vital signs are within normal limits.  The patient is saturating 95% on room air.  Results are severe hyponatremia, hyperkalemia acute kidney injury and hypercalcemia.  eICU Interventions  Phenobarbital  taper.  With as needed Ativan  place  Currently on normal saline at 100 cc/h, reduced to 50 cc given rapid improvement.  Trend sodium every 4 hours  DVT prophylaxis with heparin  GI prophylaxis not indicated, famotidine  (   0610 - Na 117, Stop IVF, may need some free water if he continues to correct rapidly  Intervention Category Evaluation Type: New Patient Evaluation  Adelene Polivka 12/18/2023, 5:10 AM

## 2023-12-18 NOTE — Progress Notes (Signed)
 Pt's Na overcorrected to 124, D5W initiated, now 119, goal correction 4-36meq in the initial 24hrs.  Hold fluids and continue to follow serial Na   Derek Cruz Tobey Lippard, PA-C

## 2023-12-18 NOTE — ED Notes (Signed)
 Graham crackers being provided and water.

## 2023-12-18 NOTE — H&P (Signed)
 NAME:  Derek Cruz, MRN:  [id number], DOB:  [birth year], LOS: 0 ADMISSION DATE:  12/17/2023, CONSULTATION DATE:  12/18/23 REFERRING MD:  Charlyn CHIEF COMPLAINT:  EtOH Withdrawal   History of Present Illness:  Pt is encephelopathic; therefore, this HPI is obtained from chart review. Derek Cruz is a 58 y.o. male who has a PMH including but not limited to alcohol  abuse, hypertension, fatty liver, CKD.  He presented to [facility] ED on 12/17/2023 with N/V/D for 3 days associated with intermittent abdominal pain and dyspnea.  He apparently had his last drink 2 days prior and he typically drinks 4-5 tall boys sized beers daily (25oz beers).  Due to N/V/D, he has not been able to drink.  He notes that he started to have tremors the day prior to presentation which is typical for him if he does not drink his normal amount.  He has had DTs in the past per his report and apparently is interested in trying to stop drinking completely.  He has had similar presentations to the ED in the past, most recently in March of this year.  Due to high census in the ED, he had a long wait in triage, over 10 hours.  Upon his initial lab draws, he was noted to have multiple metabolic derangements therefore he was sent back for repeat labs. Repeat labs confirmed multiple metabolic derangements including sodium 115, potassium 6.5, calcium  0.78.  He had received 4 mg of Ativan  over the past several hours and due to concern for high risk of withdrawals, PCCM was called for admission and consideration of Precedex.  At the time of my evaluation, he is resting comfortably, he does awaken during our exam but is encephalopathic after Ativan . Per RN, once he wakes up, he becomes agitated and is pulling at lines and attempting to get out of bed.  Pertinent  Medical History:  has Chest pain; Uncontrolled type 2 diabetes mellitus with hyperglycemia, without long-term current use of insulin  (HCC); Hyperlipidemia;  Gastroesophageal reflux disease without esophagitis; Chest discomfort; Essential hypertension; Pre-diabetes; Neck pain; Muscle strain; Undifferentiated abdominal pain; Hemoglobin A1c less than 7.0%; Liver failure (HCC); Alcoholic hepatitis; Hyponatremia; Polycythemia; Elevated liver enzymes; Alcohol  abuse; Alcohol  use disorder; Frequent falls; Alcohol  withdrawal (HCC); Abnormal TSH; Hypokalemia; and CKD (chronic kidney disease), stage III (HCC) on their problem list.   Significant Hospital Events: Including procedures, antibiotic start and stop dates in addition to other pertinent events   7/30 admit  Interim History / Subjective:  Sleeping currently. Received 4mg  Ativan  over last few hours but per RN, once he wakes up, he becomes agitated and is pulling at lines and attempting to get out of bed.  Objective:  Blood pressure (!) 129/96, pulse 94, temperature 97.7 F (36.5 C), temperature source Temporal, resp. rate 14, height 5' 5 (1.651 m), weight 76.2 kg, SpO2 95%.       No intake or output data in the 24 hours ending 12/18/23 0318 Filed Weights   12/17/23 1227  Weight: 76.2 kg     Physical Exam: General: Adult male, resting in bed, in NAD. Neuro: Currently sleeping and is encephalopathic once awakened. Per RN, once awakened, he does pull at lines and moves all extremities. HEENT: [place]/AT. Sclerae anicteric. MM dry.. Cardiovascular: RRR, no M/R/G.  Lungs: Respirations even and unlabored.  CTA bilaterally, No W/R/R. Abdomen: BS x 4, soft, NT/ND.  Musculoskeletal: No gross deformities, no edema.    Labs/imaging personally reviewed:  RUQ US  7/30 > neg for  acute cholecystitis, diffuse fatty infiltration of the liver.  Assessment & Plan:   Hx EtOH abuse with concern for withdrawal and early developing DTs. - Admit to ICU. - Start phenobarbital  taper. - If worsens, can consider Precedex but no need for now. - Continue Ativan  per CIWA protocol. - Thiamine , folate. - Continue  ongoing cessation encouragement.  Acute metabolic encephalopathy - presumed 2/2 benzos/Ativan  administration. Very low suspicion for intracranial process. - Supportive care for EtOH withdrawal as above.  Hyponatremia - 2/2 EtOH. Hypochloremia. Hyperkalemia. Hypocalcemia. - Start normal saline at 100/hr. - Monitor sodium every 4 hours. - Temporizing measures with 1 amp D50, 10 units insulin , 2 g calcium  gluconate. - Lokelma  10g x 1.  Mild pancytopenia - likely hemoconcentrated given his history. - Continue supportive care with fluid resuscitation. - Follow CBC.  Best practice (evaluated daily):  Diet/type: NPO DVT prophylaxis: prophylactic heparin   Pressure ulcer(s): pressure ulcer assessment deferred  GI prophylaxis: N/A Lines: N/A Foley:  N/A Code Status:  full code Last date of multidisciplinary goals of care discussion: None yet.  Labs   CBC: Recent Labs  Lab 12/17/23 1308 12/18/23 0102  WBC 3.0*  --   NEUTROABS 2.3  --   HGB 12.1* 16.7  HCT 34.3* 49.0  MCV 86.0  --   PLT 102*  --     Basic Metabolic Panel: Recent Labs  Lab 12/17/23 2215 12/18/23 0102  NA 107* 115*  K RESULTS UNAVAILABLE DUE TO INTERFERING SUBSTANCE 6.5*  CL 72* 87*  CO2 19*  --   GLUCOSE 193* 192*  BUN 10 13  CREATININE <0.30* 0.70  CALCIUM  7.4*  --    GFR: Estimated Creatinine Clearance: 96 mL/min (by C-G formula based on SCr of 0.7 mg/dL). Recent Labs  Lab 12/17/23 1308  WBC 3.0*    Liver Function Tests: Recent Labs  Lab 12/17/23 2215  AST RESULTS UNAVAILABLE DUE TO INTERFERING SUBSTANCE  ALT RESULTS UNAVAILABLE DUE TO INTERFERING SUBSTANCE  ALKPHOS RESULTS UNAVAILABLE DUE TO INTERFERING SUBSTANCE  BILITOT 11.6*  PROT RESULTS UNAVAILABLE DUE TO INTERFERING SUBSTANCE  ALBUMIN 3.1*   Recent Labs  Lab 12/17/23 2215  LIPASE 43   No results for input(s): AMMONIA in the last 168 hours.  ABG    Component Value Date/Time   HCO3 21.4 12/01/2022 0858   TCO2 26  12/18/2023 0102   ACIDBASEDEF 4.0 (H) 12/01/2022 0858   O2SAT 99 12/01/2022 0858     Coagulation Profile: No results for input(s): INR, PROTIME in the last 168 hours.  Cardiac Enzymes: No results for input(s): CKTOTAL, CKMB, CKMBINDEX, TROPONINI in the last 168 hours.  HbA1C: HbA1c, POC (prediabetic range)  Date/Time Value Ref Range Status  01/04/2020 12:33 PM 9.8 (A) 5.7 - 6.4 % Final   HbA1c, POC (controlled diabetic range)  Date/Time Value Ref Range Status  01/04/2020 12:33 PM 9.8 (A) 0.0 - 7.0 % Final   HbA1c POC (<> result, manual entry)  Date/Time Value Ref Range Status  01/04/2020 12:33 PM 9.8 4.0 - 5.6 % Final   Hgb A1c MFr Bld  Date/Time Value Ref Range Status  12/12/2022 06:56 AM 6.8 (H) 4.8 - 5.6 % Final    Comment:    (NOTE) Pre diabetes:          5.7%-6.4%  Diabetes:              >6.4%  Glycemic control for   <7.0% adults with diabetes   09/25/2022 08:52 PM 10.8 (H) 4.8 - 5.6 %  Final    Comment:    (NOTE) Pre diabetes:          5.7%-6.4%  Diabetes:              >6.4%  Glycemic control for   <7.0% adults with diabetes     CBG: No results for input(s): GLUCAP in the last 168 hours.  Review of Systems:   Unable to obtain as pt is encephalopathic.  Past Medical History:  He,  has a past medical history of Alcohol  abuse, Chest pain, Dyslipidemia, Elevated liver enzymes, Gastritis, and Hypertension.   Surgical History:  History reviewed. No pertinent surgical history.   Social History:   reports that he has quit smoking. He has been exposed to tobacco smoke. He has never used smokeless tobacco. He reports current alcohol  use of about 3.0 standard drinks of alcohol  per week. He reports that he does not use drugs.   Family History:  His family history is negative for Diabetes Mellitus II, Stomach cancer, and Colon cancer.   Allergies No Known Allergies   Home Medications  Prior to Admission medications   Medication Sig Start  Date End Date Taking? Authorizing Provider  chlordiazePOXIDE  (LIBRIUM ) 25 MG capsule Take 2 capsules (50mg ) by mouth 3 times a day for 1 day,  then 1-2 capsules (25-50mg ) twice a day for 1 day, then 1-2 capsules (25-50mg ) daily for 1 day 02/01/23   Yolande Lamar BROCKS, MD  folic acid  (FOLVITE ) 1 MG tablet Take 1 tablet (1 mg total) by mouth daily. 12/06/22   Elgergawy, Dawood S, MD  Melatonin 1 MG CHEW Chew 1 tablet by mouth at bedtime as needed. 08/03/23   Hildegard Loge, PA-C  metFORMIN  (GLUCOPHAGE -XR) 750 MG 24 hr tablet Take 2 tablets (1,500 mg total) by mouth daily with breakfast. Patient not taking: Reported on 12/08/2022 12/06/22 01/05/23  Elgergawy, Brayton RAMAN, MD  Multiple Vitamin (MULTIVITAMIN WITH MINERALS) TABS tablet Take 1 tablet by mouth daily. 12/06/22   Elgergawy, Brayton RAMAN, MD  ondansetron  (ZOFRAN -ODT) 4 MG disintegrating tablet Take 1 tablet (4 mg total) by mouth every 8 (eight) hours as needed. 08/03/23   Hildegard Loge, PA-C  pantoprazole  (PROTONIX ) 20 MG tablet Take 1 tablet (20 mg total) by mouth daily for 14 days. 08/01/23 08/16/23  Elnor Jayson LABOR, DO  potassium chloride  SA (KLOR-CON  M) 20 MEQ tablet Take 1 tablet (20 mEq total) by mouth daily for 5 days. 08/01/23 08/07/23  Elnor Jayson LABOR, DO  sucralfate  (CARAFATE ) 1 g tablet Take 1 tablet (1 g total) by mouth with breakfast, with lunch, and with evening meal for 7 days. 08/01/23 08/09/23  Elnor Jayson A, DO  thiamine  (VITAMIN B1) 100 MG tablet Take 1 tablet (100 mg total) by mouth daily. 12/10/22   Elgergawy, Brayton RAMAN, MD     Critical care time: 35 min.   Sammi Gore, PA - [facility] For pager details, please see AMION or use Epic chat  After 1900, please call [facility] for cross coverage needs 12/18/2023, 3:18 AM

## 2023-12-18 NOTE — Progress Notes (Signed)
 NAME:  Derek Cruz, MRN:  [id number], DOB:  Apr 03, 1966, LOS: 0 ADMISSION DATE:  12/17/2023, CONSULTATION DATE:  12/18/23 REFERRING MD:  Charlyn CHIEF COMPLAINT:  EtOH Withdrawal   History of Present Illness:  Pt is encephelopathic; therefore, this HPI is obtained from chart review. Derek Cruz is a 58 y.o. male who has a PMH including but not limited to alcohol  abuse, hypertension, fatty liver, CKD.  He presented to [facility] ED on 12/17/2023 with N/V/D for 3 days associated with intermittent abdominal pain and dyspnea.  He apparently had his last drink 2 days prior and he typically drinks 4-5 tall boys sized beers daily (25oz beers).  Due to N/V/D, he has not been able to drink.  He notes that he started to have tremors the day prior to presentation which is typical for him if he does not drink his normal amount.  He has had DTs in the past per his report and apparently is interested in trying to stop drinking completely.  He has had similar presentations to the ED in the past, most recently in March of this year.  Due to high census in the ED, he had a long wait in triage, over 10 hours.  Upon his initial lab draws, he was noted to have multiple metabolic derangements therefore he was sent back for repeat labs. Repeat labs confirmed multiple metabolic derangements including sodium 115, potassium 6.5, calcium  0.78.  He had received 4 mg of Ativan  over the past several hours and due to concern for high risk of withdrawals, PCCM was called for admission and consideration of Precedex.  At the time of my evaluation, he is resting comfortably, he does awaken during our exam but is encephalopathic after Ativan . Per RN, once he wakes up, he becomes agitated and is pulling at lines and attempting to get out of bed.  Pertinent  Medical History:  has Chest pain; Uncontrolled type 2 diabetes mellitus with hyperglycemia, without long-term current use of insulin  (HCC); Hyperlipidemia;  Gastroesophageal reflux disease without esophagitis; Chest discomfort; Essential hypertension; Pre-diabetes; Neck pain; Muscle strain; Undifferentiated abdominal pain; Hemoglobin A1c less than 7.0%; Liver failure (HCC); Alcoholic hepatitis; Hyponatremia; Polycythemia; Elevated liver enzymes; Alcohol  abuse; Alcohol  use disorder; Frequent falls; Alcohol  withdrawal (HCC); Abnormal TSH; Hypokalemia; and CKD (chronic kidney disease), stage III (HCC) on their problem list.   Significant Hospital Events: Including procedures, antibiotic start and stop dates in addition to other pertinent events   7/30 admit  Interim History / Subjective:  Received ativan  around 5am and is sleeping but arousable and will follow commands without airway compromise Overcorrected Na, started D5W Trouble resulting BMP due to interfering substance per lab Checking ABG to get a potassium level along with triglycerides   Objective:  Blood pressure 118/79, pulse 92, temperature 98 F (36.7 C), temperature source Axillary, resp. rate 15, height 5' 5 (1.651 m), weight 69.2 kg, SpO2 92%.        Intake/Output Summary (Last 24 hours) at 12/18/2023 0740 Last data filed at 12/18/2023 0657 Gross per 24 hour  Intake 1183.65 ml  Output 800 ml  Net 383.65 ml   Filed Weights   12/17/23 1227 12/18/23 0442  Weight: 76.2 kg 69.2 kg     Physical Exam: General: Adult male, resting in bed, in NAD. Neuro: sleeping but arousable to voice and moves all extremities to command, slight tremor, not currently agitated  HEENT: [place]/AT. Sclerae anicteric. MM dry.. Cardiovascular: RRR, no M/R/G.  Lungs: clear bilaterally  Abdomen: BS  x 4, soft, NT/ND.  Musculoskeletal: No gross deformities, no edema.    Labs/imaging personally reviewed:  RUQ US  7/30 > neg for acute cholecystitis, diffuse fatty infiltration of the liver.  Assessment & Plan:   Hx EtOH abuse with concern for withdrawal and early developing DTs. - Admit to ICU. -  continue phenobarbital  taper. - Continue Ativan  per CIWA protocol. - Thiamine , folate. - Continue ongoing cessation encouragement.  Acute metabolic encephalopathy - presumed 2/2 benzos/Ativan  administration. Very low suspicion for intracranial process. - Supportive care for EtOH withdrawal as above. -if neuro exam changes consider head CT  Hyponatremia - 2/2 EtOH. Hypochloremia. Hyperkalemia. Hypocalcemia. - saline held overnight as Na overcorrected -start D5W - Monitor sodium every 4 hours. - Temporizing measures with 1 amp D50, 10 units insulin , 2 g calcium  gluconate. - Lokelma  10g x 1. -obtain ABG for K level as BMP not resulting   Mild pancytopenia - likely hemoconcentrated given his history. -improved   Best practice (evaluated daily):  Diet/type: NPO DVT prophylaxis: prophylactic heparin   Pressure ulcer(s): pressure ulcer assessment deferred  GI prophylaxis: N/A Lines: N/A Foley:  N/A Code Status:  full code Last date of multidisciplinary goals of care discussion: None yet. Will attempt to update family today  Labs   CBC: Recent Labs  Lab 12/17/23 1308 12/18/23 0102 12/18/23 0456  WBC 3.0*  --  4.7  NEUTROABS 2.3  --   --   HGB 12.1* 16.7 13.1  HCT 34.3* 49.0 36.5*  MCV 86.0  --  86.1  PLT 102*  --  157    Basic Metabolic Panel: Recent Labs  Lab 12/17/23 2215 12/18/23 0102 12/18/23 0456 12/18/23 0457 12/18/23 0458  NA 107* 115*  --  117* 117*  K RESULTS UNAVAILABLE DUE TO INTERFERING SUBSTANCE 6.5*  --  RESULTS UNAVAILABLE DUE TO INTERFERING SUBSTANCE  --   CL 72* 87*  --  83*  --   CO2 19*  --   --  20*  --   GLUCOSE 193* 192*  --  193*  --   BUN 10 13  --  7  --   CREATININE <0.30* 0.70  --  0.37*  --   CALCIUM  7.4*  --   --  7.6*  --   MG  --   --  3.0*  --   --   PHOS  --   --  RESULTS UNAVAILABLE DUE TO INTERFERING SUBSTANCE  --   --    GFR: Estimated Creatinine Clearance: 87.6 mL/min (A) (by C-G formula based on SCr of 0.37 mg/dL  (L)). Recent Labs  Lab 12/17/23 1308 12/18/23 0456  WBC 3.0* 4.7    Liver Function Tests: Recent Labs  Lab 12/17/23 2215 12/18/23 0457  AST RESULTS UNAVAILABLE DUE TO INTERFERING SUBSTANCE RESULTS UNAVAILABLE DUE TO INTERFERING SUBSTANCE  ALT RESULTS UNAVAILABLE DUE TO INTERFERING SUBSTANCE RESULTS UNAVAILABLE DUE TO INTERFERING SUBSTANCE  ALKPHOS RESULTS UNAVAILABLE DUE TO INTERFERING SUBSTANCE RESULTS UNAVAILABLE DUE TO INTERFERING SUBSTANCE  BILITOT 11.6* RESULTS UNAVAILABLE DUE TO INTERFERING SUBSTANCE  PROT RESULTS UNAVAILABLE DUE TO INTERFERING SUBSTANCE RESULTS UNAVAILABLE DUE TO INTERFERING SUBSTANCE  ALBUMIN 3.1* 2.5*   Recent Labs  Lab 12/17/23 2215  LIPASE 43   No results for input(s): AMMONIA in the last 168 hours.  ABG    Component Value Date/Time   HCO3 21.4 12/01/2022 0858   TCO2 26 12/18/2023 0102   ACIDBASEDEF 4.0 (H) 12/01/2022 0858   O2SAT 99 12/01/2022 0858     Coagulation  Profile: No results for input(s): INR, PROTIME in the last 168 hours.  Cardiac Enzymes: No results for input(s): CKTOTAL, CKMB, CKMBINDEX, TROPONINI in the last 168 hours.  HbA1C: HbA1c, POC (prediabetic range)  Date/Time Value Ref Range Status  01/04/2020 12:33 PM 9.8 (A) 5.7 - 6.4 % Final   HbA1c, POC (controlled diabetic range)  Date/Time Value Ref Range Status  01/04/2020 12:33 PM 9.8 (A) 0.0 - 7.0 % Final   HbA1c POC (<> result, manual entry)  Date/Time Value Ref Range Status  01/04/2020 12:33 PM 9.8 4.0 - 5.6 % Final   Hgb A1c MFr Bld  Date/Time Value Ref Range Status  12/18/2023 04:57 AM 9.0 (H) 4.8 - 5.6 % Final    Comment:    (NOTE) Diagnosis of Diabetes The following HbA1c ranges recommended by the American Diabetes Association (ADA) may be used as an aid in the diagnosis of diabetes mellitus.  Hemoglobin             Suggested A1C NGSP%              Diagnosis  <5.7                   Non Diabetic  5.7-6.4                 Pre-Diabetic  >6.4                   Diabetic  <7.0                   Glycemic control for                       adults with diabetes.    12/12/2022 06:56 AM 6.8 (H) 4.8 - 5.6 % Final    Comment:    (NOTE) Pre diabetes:          5.7%-6.4%  Diabetes:              >6.4%  Glycemic control for   <7.0% adults with diabetes     CBG: Recent Labs  Lab 12/18/23 0439 12/18/23 0720  GLUCAP 270* 136*    Review of Systems:   Unable to obtain as pt is encephalopathic.  Past Medical History:  He,  has a past medical history of Alcohol  abuse, Chest pain, Dyslipidemia, Elevated liver enzymes, Gastritis, and Hypertension.   Surgical History:  History reviewed. No pertinent surgical history.   Social History:   reports that he has quit smoking. He has been exposed to tobacco smoke. He has never used smokeless tobacco. He reports current alcohol  use of about 3.0 standard drinks of alcohol  per week. He reports that he does not use drugs.   Family History:  His family history is negative for Diabetes Mellitus II, Stomach cancer, and Colon cancer.   Allergies No Known Allergies   Home Medications  Prior to Admission medications   Medication Sig Start Date End Date Taking? Authorizing Provider  chlordiazePOXIDE  (LIBRIUM ) 25 MG capsule Take 2 capsules (50mg ) by mouth 3 times a day for 1 day,  then 1-2 capsules (25-50mg ) twice a day for 1 day, then 1-2 capsules (25-50mg ) daily for 1 day 02/01/23   Yolande Lamar BROCKS, MD  folic acid  (FOLVITE ) 1 MG tablet Take 1 tablet (1 mg total) by mouth daily. 12/06/22   Elgergawy, Dawood S, MD  Melatonin 1 MG CHEW Chew 1 tablet by mouth at bedtime as needed. 08/03/23  Hildegard, Amjad, PA-C  metFORMIN  (GLUCOPHAGE -XR) 750 MG 24 hr tablet Take 2 tablets (1,500 mg total) by mouth daily with breakfast. Patient not taking: Reported on 12/08/2022 12/06/22 01/05/23  Elgergawy, Brayton RAMAN, MD  Multiple Vitamin (MULTIVITAMIN WITH MINERALS) TABS tablet Take 1 tablet by mouth  daily. 12/06/22   Elgergawy, Brayton RAMAN, MD  ondansetron  (ZOFRAN -ODT) 4 MG disintegrating tablet Take 1 tablet (4 mg total) by mouth every 8 (eight) hours as needed. 08/03/23   Hildegard, Amjad, PA-C  pantoprazole  (PROTONIX ) 20 MG tablet Take 1 tablet (20 mg total) by mouth daily for 14 days. 08/01/23 08/16/23  Elnor Jayson LABOR, DO  potassium chloride  SA (KLOR-CON  M) 20 MEQ tablet Take 1 tablet (20 mEq total) by mouth daily for 5 days. 08/01/23 08/07/23  Elnor Jayson LABOR, DO  sucralfate  (CARAFATE ) 1 g tablet Take 1 tablet (1 g total) by mouth with breakfast, with lunch, and with evening meal for 7 days. 08/01/23 08/09/23  Elnor Jayson A, DO  thiamine  (VITAMIN B1) 100 MG tablet Take 1 tablet (100 mg total) by mouth daily. 12/10/22   Elgergawy, Brayton RAMAN, MD     Critical care time: additional CC time 40 minutes   CRITICAL CARE Performed by: Leita SAUNDERS Elyce Zollinger   Total critical care time: 40 minutes  Critical care time was exclusive of separately billable procedures and treating other patients.  Critical care was necessary to treat or prevent imminent or life-threatening deterioration.  Critical care was time spent personally by me on the following activities: development of treatment plan with patient and/or surrogate as well as nursing, discussions with consultants, evaluation of patient's response to treatment, examination of patient, obtaining history from patient or surrogate, ordering and performing treatments and interventions, ordering and review of laboratory studies, ordering and review of radiographic studies, pulse oximetry and re-evaluation of patient's condition.   Leita SAUNDERS Jontae Adebayo, PA-C [facility] See Amion for pager If no response to pager , please call [phone] until 7pm After 7:00 pm call Elink  [phone]

## 2023-12-18 NOTE — ED Notes (Signed)
 Communicating with pt using translator, Pt is hicupping and requesting food and water. MD made aware.

## 2023-12-19 DIAGNOSIS — F1010 Alcohol abuse, uncomplicated: Secondary | ICD-10-CM

## 2023-12-19 DIAGNOSIS — E781 Pure hyperglyceridemia: Secondary | ICD-10-CM

## 2023-12-19 LAB — LIPID PANEL
Cholesterol: 587 mg/dL — ABNORMAL HIGH (ref 0–200)
LDL Cholesterol: UNDETERMINED mg/dL (ref 0–99)
Triglycerides: 2290 mg/dL — ABNORMAL HIGH (ref <150)
VLDL: UNDETERMINED mg/dL (ref 0–40)

## 2023-12-19 LAB — SODIUM
Sodium: 129 mmol/L — ABNORMAL LOW (ref 135–145)
Sodium: 126 mmol/L — ABNORMAL LOW (ref 135–145)
Sodium: 129 mmol/L — ABNORMAL LOW (ref 135–145)

## 2023-12-19 LAB — TRIGLYCERIDES
Triglycerides: 1643 mg/dL — ABNORMAL HIGH (ref <150)
Triglycerides: 2138 mg/dL — ABNORMAL HIGH (ref <150)

## 2023-12-19 LAB — CBC
HCT: 35.7 % — ABNORMAL LOW (ref 39.0–52.0)
Hemoglobin: 13.2 g/dL (ref 13.0–17.0)
MCH: 31.9 pg (ref 26.0–34.0)
MCHC: 37 g/dL — ABNORMAL HIGH (ref 30.0–36.0)
MCV: 86.2 fL (ref 80.0–100.0)
Platelets: 78 K/uL — ABNORMAL LOW (ref 150–400)
RBC: 4.14 MIL/uL — ABNORMAL LOW (ref 4.22–5.81)
RDW: 13.4 % (ref 11.5–15.5)
WBC: 4.4 K/uL (ref 4.0–10.5)
nRBC: 0 % (ref 0.0–0.2)

## 2023-12-19 LAB — POCT I-STAT EG7
Acid-Base Excess: 4 mmol/L — ABNORMAL HIGH (ref 0.0–2.0)
Bicarbonate: 26.5 mmol/L (ref 20.0–28.0)
Calcium, Ion: 1.16 mmol/L (ref 1.15–1.40)
HCT: 40 % (ref 39.0–52.0)
Hemoglobin: 13.6 g/dL (ref 13.0–17.0)
O2 Saturation: 94 %
Patient temperature: 98.5
Potassium: 2.8 mmol/L — ABNORMAL LOW (ref 3.5–5.1)
Sodium: 128 mmol/L — ABNORMAL LOW (ref 135–145)
TCO2: 28 mmol/L (ref 22–32)
pCO2, Ven: 33.2 mmHg — ABNORMAL LOW (ref 44–60)
pH, Ven: 7.51 — ABNORMAL HIGH (ref 7.25–7.43)
pO2, Ven: 61 mmHg — ABNORMAL HIGH (ref 32–45)

## 2023-12-19 LAB — BASIC METABOLIC PANEL WITH GFR
Anion gap: 12 (ref 5–15)
Anion gap: 13 (ref 5–15)
BUN: <5 mg/dL — ABNORMAL LOW (ref 6–20)
BUN: <5 mg/dL — ABNORMAL LOW (ref 6–20)
CO2: 23 mmol/L (ref 22–32)
CO2: 18 mmol/L — ABNORMAL LOW (ref 22–32)
Calcium: 8.1 mg/dL — ABNORMAL LOW (ref 8.9–10.3)
Calcium: 8.4 mg/dL — ABNORMAL LOW (ref 8.9–10.3)
Chloride: 92 mmol/L — ABNORMAL LOW (ref 98–111)
Chloride: 97 mmol/L — ABNORMAL LOW (ref 98–111)
Creatinine, Ser: 0.66 mg/dL (ref 0.61–1.24)
Creatinine, Ser: 0.76 mg/dL (ref 0.61–1.24)
GFR, Estimated: >60 mL/min (ref >60)
GFR, Estimated: >60 mL/min (ref >60)
Glucose, Bld: 279 mg/dL — ABNORMAL HIGH (ref 70–99)
Glucose, Bld: 195 mg/dL — ABNORMAL HIGH (ref 70–99)
Potassium: 2.9 mmol/L — ABNORMAL LOW (ref 3.5–5.1)
Potassium: 4 mmol/L (ref 3.5–5.1)
Sodium: 127 mmol/L — ABNORMAL LOW (ref 135–145)
Sodium: 128 mmol/L — ABNORMAL LOW (ref 135–145)

## 2023-12-19 LAB — GLUCOSE, CAPILLARY
Glucose-Capillary: 316 mg/dL — ABNORMAL HIGH (ref 70–99)
Glucose-Capillary: 150 mg/dL — ABNORMAL HIGH (ref 70–99)
Glucose-Capillary: 67 mg/dL — ABNORMAL LOW (ref 70–99)
Glucose-Capillary: 74 mg/dL (ref 70–99)
Glucose-Capillary: 192 mg/dL — ABNORMAL HIGH (ref 70–99)
Glucose-Capillary: 297 mg/dL — ABNORMAL HIGH (ref 70–99)
Glucose-Capillary: 229 mg/dL — ABNORMAL HIGH (ref 70–99)
Glucose-Capillary: 183 mg/dL — ABNORMAL HIGH (ref 70–99)
Glucose-Capillary: 192 mg/dL — ABNORMAL HIGH (ref 70–99)
Glucose-Capillary: 210 mg/dL — ABNORMAL HIGH (ref 70–99)
Glucose-Capillary: 253 mg/dL — ABNORMAL HIGH (ref 70–99)
Glucose-Capillary: 200 mg/dL — ABNORMAL HIGH (ref 70–99)
Glucose-Capillary: 214 mg/dL — ABNORMAL HIGH (ref 70–99)
Glucose-Capillary: 260 mg/dL — ABNORMAL HIGH (ref 70–99)
Glucose-Capillary: 238 mg/dL — ABNORMAL HIGH (ref 70–99)
Glucose-Capillary: 268 mg/dL — ABNORMAL HIGH (ref 70–99)
Glucose-Capillary: 200 mg/dL — ABNORMAL HIGH (ref 70–99)
Glucose-Capillary: 210 mg/dL — ABNORMAL HIGH (ref 70–99)

## 2023-12-19 LAB — LIPASE, BLOOD: Lipase: 81 U/L — ABNORMAL HIGH (ref 11–51)

## 2023-12-19 LAB — MAGNESIUM

## 2023-12-19 LAB — PHOSPHORUS
Phosphorus: 1.1 mg/dL — ABNORMAL LOW (ref 2.5–4.6)
Phosphorus: 2.1 mg/dL — ABNORMAL LOW (ref 2.5–4.6)

## 2023-12-19 LAB — LDL CHOLESTEROL, DIRECT: Direct LDL: UNDETERMINED mg/dL (ref 0–99)

## 2023-12-19 MED ORDER — LORAZEPAM 1 MG PO TABS: 0.0000 mg | ORAL_TABLET | Freq: Three times a day (TID) | ORAL | Status: AC

## 2023-12-19 MED ORDER — LORAZEPAM 1 MG PO TABS: 1.0000 mg | ORAL_TABLET | ORAL | Status: AC | PRN

## 2023-12-19 MED ORDER — LORAZEPAM 1 MG PO TABS
0.0000 mg | ORAL_TABLET | ORAL | Status: AC
  Administered 2023-12-19: 1 mg via ORAL
  Administered 2023-12-20: 2 mg via ORAL
  Administered 2023-12-20: 1 mg via ORAL
  Filled 2023-12-19: qty 1
  Filled 2023-12-19: qty 2
  Filled 2023-12-19: qty 1

## 2023-12-19 MED ORDER — POTASSIUM & SODIUM PHOSPHATES 280-160-250 MG PO PACK
1.0000 | PACK | Freq: Three times a day (TID) | ORAL | Status: DC
  Administered 2023-12-19 – 2023-12-24 (×18): 1 via ORAL
  Filled 2023-12-19 (×21): qty 1

## 2023-12-19 MED ORDER — INSULIN (MYXREDLIN) INFUSION FOR HYPERTRIGLYCERIDEMIA
0.1000 [IU]/kg/h | INTRAVENOUS | Status: DC
  Administered 2023-12-19: 0.2 [IU]/kg/h via INTRAVENOUS
  Administered 2023-12-19 – 2023-12-20 (×3): 0.1 [IU]/kg/h via INTRAVENOUS
  Filled 2023-12-19 (×4): qty 100

## 2023-12-19 MED ORDER — ATORVASTATIN CALCIUM 80 MG PO TABS
80.0000 mg | ORAL_TABLET | Freq: Every day | ORAL | Status: DC
  Administered 2023-12-19 – 2023-12-21 (×3): 80 mg via ORAL
  Filled 2023-12-19 (×3): qty 1

## 2023-12-19 MED ORDER — POTASSIUM PHOSPHATES 15 MMOLE/5ML IV SOLN
30.0000 mmol | Freq: Once | INTRAVENOUS | Status: AC
  Administered 2023-12-19: 30 mmol via INTRAVENOUS
  Filled 2023-12-19: qty 10

## 2023-12-19 MED ORDER — PHENOL 1.4 % MT LIQD
1.0000 | OROMUCOSAL | Status: DC | PRN
  Administered 2023-12-19: 1 via OROMUCOSAL
  Filled 2023-12-19: qty 177

## 2023-12-19 MED ORDER — POTASSIUM CHLORIDE 20 MEQ PO PACK
60.0000 meq | PACK | Freq: Once | ORAL | Status: AC
  Administered 2023-12-19: 60 meq via ORAL
  Filled 2023-12-19: qty 3

## 2023-12-19 MED ORDER — DEXTROSE 5 % IV SOLN: INTRAVENOUS | Status: DC

## 2023-12-19 MED ORDER — POTASSIUM CHLORIDE 10 MEQ/100ML IV SOLN
10.0000 meq | INTRAVENOUS | Status: AC
  Administered 2023-12-19 (×6): 10 meq via INTRAVENOUS
  Filled 2023-12-19 (×6): qty 100

## 2023-12-19 NOTE — Plan of Care (Signed)
  Problem: Education: Goal: Knowledge of General Education information will improve Description: Including pain rating scale, medication(s)/side effects and non-pharmacologic comfort measures Outcome: Progressing   Problem: Health Behavior/Discharge Planning: Goal: Ability to manage health-related needs will improve Outcome: Progressing   Problem: Clinical Measurements: Goal: Ability to maintain clinical measurements within normal limits will improve Outcome: Progressing Goal: Will remain free from infection Outcome: Progressing Goal: Diagnostic test results will improve Outcome: Progressing Goal: Respiratory complications will improve Outcome: Progressing Goal: Cardiovascular complication will be avoided Outcome: Progressing   Problem: Activity: Goal: Risk for activity intolerance will decrease Outcome: Progressing   Problem: Nutrition: Goal: Adequate nutrition will be maintained Outcome: Progressing   Problem: Coping: Goal: Level of anxiety will decrease Outcome: Progressing   Problem: Elimination: Goal: Will not experience complications related to bowel motility Outcome: Progressing Goal: Will not experience complications related to urinary retention Outcome: Progressing   Problem: Pain Managment: Goal: General experience of comfort will improve and/or be controlled Outcome: Progressing   Problem: Safety: Goal: Ability to remain free from injury will improve Outcome: Progressing   Problem: Skin Integrity: Goal: Risk for impaired skin integrity will decrease Outcome: Progressing   Problem: Coping: Goal: Ability to adjust to condition or change in health will improve Outcome: Progressing   Problem: Fluid Volume: Goal: Ability to maintain a balanced intake and output will improve Outcome: Progressing   Problem: Metabolic: Goal: Ability to maintain appropriate glucose levels will improve Outcome: Progressing   Problem: Nutritional: Goal: Maintenance of  adequate nutrition will improve Outcome: Progressing Goal: Progress toward achieving an optimal weight will improve Outcome: Progressing   Problem: Skin Integrity: Goal: Risk for impaired skin integrity will decrease Outcome: Progressing   Problem: Tissue Perfusion: Goal: Adequacy of tissue perfusion will improve Outcome: Progressing

## 2023-12-19 NOTE — Evaluation (Signed)
 Physical Therapy Evaluation Patient Details Name: Derek Cruz MRN: [id number] DOB: 1965-05-29 Today's Date: 12/19/2023  History of Present Illness  Pt is 58 yo presenting to [facility] on 7/30 due to 3 days of N/V/D associated with intermittent abdominal pain and dyspnea. PMH: ETOH abuse, HTN, fatty liver, CKD.  Clinical Impression  Pt is presenting below baseline level of functioning. Prior to hospitalization pt was working a high functioning job. Currently pt is Min A for sit to stand and gait with significant decrease in gait speed. Poor righting reactions with high risk for falls. Due to pt current functional status, home set up and available assistance at home recommending skilled physical therapy services > 3 hours/day in order to address strength, balance and functional mobility to decrease risk for falls, injury, immobility, skin break down and re-hospitalization.          If plan is discharge home, recommend the following: A lot of help with walking and/or transfers;Help with stairs or ramp for entrance;Assist for transportation;Assistance with cooking/housework     Equipment Recommendations Rolling walker (2 wheels);BSC/3in1  Recommendations for Other Services  Rehab consult;OT consult    Functional Status Assessment Patient has had a recent decline in their functional status and demonstrates the ability to make significant improvements in function in a reasonable and predictable amount of time.     Precautions / Restrictions Precautions Precautions: Fall Recall of Precautions/Restrictions: Impaired Restrictions Weight Bearing Restrictions Per Provider Order: No      Mobility  Bed Mobility Overal bed mobility: Needs Assistance Bed Mobility: Supine to Sit, Sit to Supine     Supine to sit: Min assist Sit to supine: Min assist   General bed mobility comments: Min A for trunk to midline, Min A for LE to supine    Transfers Overall transfer level: Needs  assistance Equipment used: None Transfers: Sit to/from Stand Sit to Stand: Min assist           General transfer comment: Min A for balance on standing, large swaying, delayed balance reactions    Ambulation/Gait Ambulation/Gait assistance: Min assist Gait Distance (Feet): 120 Feet Assistive device: 1 person hand held assist Gait Pattern/deviations: Step-through pattern, Wide base of support, Staggering left, Decreased stride length, Decreased dorsiflexion - right, Decreased dorsiflexion - left, Ataxic Gait velocity: decreased Gait velocity interpretation: <1.31 ft/sec, indicative of household ambulator   General Gait Details: Pt staggering L , Min A for balance, mutliple instances of LOB with delayed balance strategies requiring Min A to maintain upright balance.     Balance Overall balance assessment: Needs assistance Sitting-balance support: Bilateral upper extremity supported, Feet unsupported Sitting balance-Leahy Scale: Fair   Postural control: Posterior lean, Left lateral lean Standing balance support: No upper extremity supported, During functional activity Standing balance-Leahy Scale: Poor Standing balance comment: Requires Min A to prevent falls multiple times       Pertinent Vitals/Pain Pain Assessment Pain Assessment: Faces Faces Pain Scale: Hurts little more Pain Location: headache Pain Descriptors / Indicators: Aching Pain Intervention(s): Limited activity within patient's tolerance, Monitored during session    Home Living Family/patient expects to be discharged to:: Private residence Living Arrangements: Other relatives;Non-relatives/Friends Available Help at Discharge: Friend(s);Available 24 hours/day Type of Home: Mobile home Home Access: Stairs to enter Entrance Stairs-Rails: Left;Right;Can reach both Entrance Stairs-Number of Steps: 4-5   Home Layout: One level Home Equipment: None      Prior Function Prior Level of Function :  Independent/Modified Independent  Mobility Comments: works in [employer] ADLs Comments: ind ADL's/IADL     Extremity/Trunk Assessment   Upper Extremity Assessment Upper Extremity Assessment: Defer to OT evaluation    Lower Extremity Assessment Lower Extremity Assessment: Generalized weakness    Cervical / Trunk Assessment Cervical / Trunk Assessment: Normal  Communication   Communication Communication: No apparent difficulties (pt declined spanish interpreter)    Cognition Arousal: Alert Behavior During Therapy: WFL for tasks assessed/performed   PT - Cognitive impairments: No apparent impairments       Following commands: Intact       Cueing Cueing Techniques: Verbal cues     General Comments General comments (skin integrity, edema, etc.): HR up to 133 bpm during session        Assessment/Plan    PT Assessment Patient needs continued PT services  PT Problem List Decreased strength;Decreased coordination;Decreased balance;Decreased mobility;Decreased safety awareness       PT Treatment Interventions DME instruction;Balance training;Gait training;Neuromuscular re-education;Stair training;Functional mobility training;Therapeutic activities;Therapeutic exercise;Patient/family education    PT Goals (Current goals can be found in the Care Plan section)  Acute Rehab PT Goals Patient Stated Goal: to return to work and get stronger PT Goal Formulation: With patient Time For Goal Achievement: 01/02/24 Potential to Achieve Goals: Good    Frequency Min 3X/week        AM-PAC PT 6 Clicks Mobility  Outcome Measure Help needed turning from your back to your side while in a flat bed without using bedrails?: A Little Help needed moving from lying on your back to sitting on the side of a flat bed without using bedrails?: A Little Help needed moving to and from a bed to a chair (including a wheelchair)?: A Little Help needed standing up from a chair  using your arms (e.g., wheelchair or bedside chair)?: A Little Help needed to walk in hospital room?: A Lot Help needed climbing 3-5 steps with a railing? : A Lot 6 Click Score: 16    End of Session Equipment Utilized During Treatment: Gait belt Activity Tolerance: Patient tolerated treatment well Patient left: Other (comment);with nursing/sitter in room (RN in room, pt left on toilet) Nurse Communication: Mobility status;Other (comment) (RN aware pt on toilet) PT Visit Diagnosis: Other abnormalities of gait and mobility (R26.89);Unsteadiness on feet (R26.81);Ataxic gait (R26.0)    Time: 1455-1520 PT Time Calculation (min) (ACUTE ONLY): 25 min   Charges:   PT Evaluation $PT Eval Low Complexity: 1 Low PT Treatments $Therapeutic Activity: 8-22 mins PT General Charges $$ ACUTE PT VISIT: 1 Visit         Dorothyann Maier, DPT, CLT  Acute Rehabilitation Services Office: [phone] (Secure chat preferred)   Dorothyann VEAR Maier 12/19/2023, 3:35 PM

## 2023-12-19 NOTE — Progress Notes (Signed)
 NAME:  Derek Cruz, MRN:  [id number], DOB:  [birth year], LOS: 1 ADMISSION DATE:  12/17/2023, CONSULTATION DATE:  12/18/23 REFERRING MD:  Charlyn CHIEF COMPLAINT:  EtOH Withdrawal   History of Present Illness:  Pt is encephelopathic; therefore, this HPI is obtained from chart review. Derek Cruz is a 58 y.o. male who has a PMH including but not limited to alcohol  abuse, hypertension, fatty liver, CKD.  He presented to [facility] ED on 12/17/2023 with N/V/D for 3 days associated with intermittent abdominal pain and dyspnea.  He apparently had his last drink 2 days prior and he typically drinks 4-5 tall boys sized beers daily (25oz beers).  Due to N/V/D, he has not been able to drink.  He notes that he started to have tremors the day prior to presentation which is typical for him if he does not drink his normal amount.  He has had DTs in the past per his report and apparently is interested in trying to stop drinking completely.  He has had similar presentations to the ED in the past, most recently in March of this year.  Due to high census in the ED, he had a long wait in triage, over 10 hours.  Upon his initial lab draws, he was noted to have multiple metabolic derangements therefore he was sent back for repeat labs. Repeat labs confirmed multiple metabolic derangements including sodium 115, potassium 6.5, calcium  0.78.  He had received 4 mg of Ativan  over the past several hours and due to concern for high risk of withdrawals, PCCM was called for admission and consideration of Precedex.  At the time of my evaluation, he is resting comfortably, he does awaken during our exam but is encephalopathic after Ativan . Per RN, once he wakes up, he becomes agitated and is pulling at lines and attempting to get out of bed.  Pertinent  Medical History:  has Chest pain; Uncontrolled type 2 diabetes mellitus with hyperglycemia, without long-term current use of insulin  (HCC); Hyperlipidemia;  Gastroesophageal reflux disease without esophagitis; Chest discomfort; Essential hypertension; Pre-diabetes; Neck pain; Muscle strain; Undifferentiated abdominal pain; Hemoglobin A1c less than 7.0%; Liver failure (HCC); Alcoholic hepatitis; Hyponatremia; Polycythemia; Elevated liver enzymes; Alcohol  abuse; Alcohol  use disorder; Frequent falls; Alcohol  withdrawal (HCC); Abnormal TSH; Hypokalemia; and CKD (chronic kidney disease), stage III (HCC) on their problem list.   Significant Hospital Events: Including procedures, antibiotic start and stop dates in addition to other pertinent events   7/30 admit  Interim History / Subjective:  Mental status much improved today Started on insulin  gtt overnight, held for hypoglycemia.  He is now eating so insulin  gtt resumed Na 127 Phos 1.1 K 2.9 Seen with the interpreter  Continue to deny abdominal pain or nausea   Objective:  Blood pressure 120/83, pulse (!) 105, temperature 98.5 F (36.9 C), temperature source Axillary, resp. rate (!) 32, height 5' 5 (1.651 m), weight 69.2 kg, SpO2 92%.        Intake/Output Summary (Last 24 hours) at 12/19/2023 0837 Last data filed at 12/19/2023 0817 Gross per 24 hour  Intake 818.28 ml  Output 1400 ml  Net -581.72 ml   Filed Weights   12/17/23 1227 12/18/23 0442  Weight: 76.2 kg 69.2 kg     Physical Exam: General: Adult male, resting in bed, awake and in NAD. Neuro: alert, oriented and conversational  HEENT: [place]/AT. Sclerae anicteric.mmm Cardiovascular: RRR, no M/R/G.  Lungs: clear bilaterally  Abdomen: BS x 4, soft, NT/ND.  Musculoskeletal: No gross deformities,  no edema.    Labs/imaging personally reviewed:  RUQ US  7/30 > neg for acute cholecystitis, diffuse fatty infiltration of the liver.  Assessment & Plan:   Hx EtOH abuse with concern for withdrawal and early developing DTs. - Admit to ICU. - complete phenobarbital  taper. -stop Ativan  and CIWA, he is alert and calm - Thiamine ,  folate. - Continue ongoing cessation encouragement.  Acute metabolic encephalopathy -  presumed 2/2 benzos/Ativan  administration. Very low suspicion for intracranial process. -improving, no signs of withdrawal today   Hyponatremia - 2/2 EtOH. Hypochloremia. Hypokalemia. Hypocalcemia. Hypophosphatemia -off IVF, Na 127 today -K and phos replaced this AM -repeat BMP this afternoon   Severe Hypertriglyceridemia  >5,000 initially Repeat >2000 today -has not had any clinical signs of pancreatitis -continue insulin  gtt today and repeat triglyceride level -cholesterol >500 -started Atorvastatin  80mg  -counseled on medication compliance and diet changes  Best practice (evaluated daily):  Diet/type: Regular consistency (see orders) DVT prophylaxis: prophylactic heparin   Pressure ulcer(s): pressure ulcer assessment deferred  GI prophylaxis: N/A Lines: N/A Foley:  N/A Code Status:  full code Last date of multidisciplinary goals of care discussion: improving, questions answered with interpretter   Labs   CBC: Recent Labs  Lab 12/17/23 1308 12/18/23 0102 12/18/23 0456 12/18/23 1034 12/19/23 0415 12/19/23 0425  WBC 3.0*  --  4.7  --  4.4  --   NEUTROABS 2.3  --   --   --   --   --   HGB 12.1* 16.7 13.1 14.3 13.2 13.6  HCT 34.3* 49.0 36.5* 42.0 35.7* 40.0  MCV 86.0  --  86.1  --  86.2  --   PLT 102*  --  157  --  78*  --     Basic Metabolic Panel: Recent Labs  Lab 12/17/23 2215 12/18/23 0102 12/18/23 0456 12/18/23 0457 12/18/23 0458 12/18/23 1034 12/18/23 1304 12/18/23 2342 12/19/23 0415 12/19/23 0425 12/19/23 0740  NA 107* 115*  --  117*   < > 124* 119* RESULTS UNAVAILABLE DUE TO INTERFERING SUBSTANCE 127* 128* 129*  K RESULTS UNAVAILABLE DUE TO INTERFERING SUBSTANCE 6.5*  --  RESULTS UNAVAILABLE DUE TO INTERFERING SUBSTANCE  --  2.6*  --   --  2.9* 2.8*  --   CL 72* 87*  --  83*  --   --   --   --  92*  --   --   CO2 19*  --   --  20*  --   --   --   --  23   --   --   GLUCOSE 193* 192*  --  193*  --   --   --   --  279*  --   --   BUN 10 13  --  7  --   --   --   --  <5*  --   --   CREATININE <0.30* 0.70  --  0.37*  --   --   --   --  0.66  --   --   CALCIUM  7.4*  --   --  7.6*  --   --   --   --  8.1*  --   --   MG  --   --  3.0*  --   --   --   --   --  RESULTS UNAVAILABLE DUE TO INTERFERING SUBSTANCE  --   --   PHOS  --   --  RESULTS UNAVAILABLE DUE  TO INTERFERING SUBSTANCE  --   --   --   --   --  1.1*  --   --    < > = values in this interval not displayed.   GFR: Estimated Creatinine Clearance: 87.6 mL/min (by C-G formula based on SCr of 0.66 mg/dL). Recent Labs  Lab 12/17/23 1308 12/18/23 0456 12/19/23 0415  WBC 3.0* 4.7 4.4    Liver Function Tests: Recent Labs  Lab 12/17/23 2215 12/18/23 0457  AST RESULTS UNAVAILABLE DUE TO INTERFERING SUBSTANCE RESULTS UNAVAILABLE DUE TO INTERFERING SUBSTANCE  ALT RESULTS UNAVAILABLE DUE TO INTERFERING SUBSTANCE RESULTS UNAVAILABLE DUE TO INTERFERING SUBSTANCE  ALKPHOS RESULTS UNAVAILABLE DUE TO INTERFERING SUBSTANCE RESULTS UNAVAILABLE DUE TO INTERFERING SUBSTANCE  BILITOT 11.6* RESULTS UNAVAILABLE DUE TO INTERFERING SUBSTANCE  PROT RESULTS UNAVAILABLE DUE TO INTERFERING SUBSTANCE RESULTS UNAVAILABLE DUE TO INTERFERING SUBSTANCE  ALBUMIN 3.1* 2.5*   Recent Labs  Lab 12/17/23 2215 12/19/23 0415  LIPASE 43 81*   No results for input(s): AMMONIA in the last 168 hours.  ABG    Component Value Date/Time   PHART 7.546 (H) 12/18/2023 1034   PCO2ART 33.9 12/18/2023 1034   PO2ART 73 (L) 12/18/2023 1034   HCO3 26.5 12/19/2023 0425   TCO2 28 12/19/2023 0425   ACIDBASEDEF 4.0 (H) 12/01/2022 0858   O2SAT 94 12/19/2023 0425     Coagulation Profile: No results for input(s): INR, PROTIME in the last 168 hours.  Cardiac Enzymes: No results for input(s): CKTOTAL, CKMB, CKMBINDEX, TROPONINI in the last 168 hours.  HbA1C: HbA1c, POC (prediabetic range)  Date/Time Value Ref  Range Status  01/04/2020 12:33 PM 9.8 (A) 5.7 - 6.4 % Final   HbA1c, POC (controlled diabetic range)  Date/Time Value Ref Range Status  01/04/2020 12:33 PM 9.8 (A) 0.0 - 7.0 % Final   HbA1c POC (<> result, manual entry)  Date/Time Value Ref Range Status  01/04/2020 12:33 PM 9.8 4.0 - 5.6 % Final   Hgb A1c MFr Bld  Date/Time Value Ref Range Status  12/18/2023 04:57 AM 9.0 (H) 4.8 - 5.6 % Final    Comment:    (NOTE) Diagnosis of Diabetes The following HbA1c ranges recommended by the American Diabetes Association (ADA) may be used as an aid in the diagnosis of diabetes mellitus.  Hemoglobin             Suggested A1C NGSP%              Diagnosis  <5.7                   Non Diabetic  5.7-6.4                Pre-Diabetic  >6.4                   Diabetic  <7.0                   Glycemic control for                       adults with diabetes.    12/12/2022 06:56 AM 6.8 (H) 4.8 - 5.6 % Final    Comment:    (NOTE) Pre diabetes:          5.7%-6.4%  Diabetes:              >6.4%  Glycemic control for   <7.0% adults with diabetes     CBG: Recent Labs  Lab  12/19/23 0423 12/19/23 0533 12/19/23 0631 12/19/23 0704 12/19/23 0812  GLUCAP 316* 150* 74 67* 192*    Review of Systems:   Unable to obtain as pt is encephalopathic.  Past Medical History:  He,  has a past medical history of Alcohol  abuse, Chest pain, Dyslipidemia, Elevated liver enzymes, Gastritis, and Hypertension.   Surgical History:  History reviewed. No pertinent surgical history.   Social History:   reports that he has quit smoking. He has been exposed to tobacco smoke. He has never used smokeless tobacco. He reports current alcohol  use of about 3.0 standard drinks of alcohol  per week. He reports that he does not use drugs.   Family History:  His family history is negative for Diabetes Mellitus II, Stomach cancer, and Colon cancer.   Allergies No Known Allergies   Home Medications  Prior to  Admission medications   Medication Sig Start Date End Date Taking? Authorizing Provider  chlordiazePOXIDE  (LIBRIUM ) 25 MG capsule Take 2 capsules (50mg ) by mouth 3 times a day for 1 day,  then 1-2 capsules (25-50mg ) twice a day for 1 day, then 1-2 capsules (25-50mg ) daily for 1 day 02/01/23   Yolande Lamar BROCKS, MD  folic acid  (FOLVITE ) 1 MG tablet Take 1 tablet (1 mg total) by mouth daily. 12/06/22   Elgergawy, Dawood S, MD  Melatonin 1 MG CHEW Chew 1 tablet by mouth at bedtime as needed. 08/03/23   Hildegard Loge, PA-C  metFORMIN  (GLUCOPHAGE -XR) 750 MG 24 hr tablet Take 2 tablets (1,500 mg total) by mouth daily with breakfast. Patient not taking: Reported on 12/08/2022 12/06/22 01/05/23  Elgergawy, Brayton RAMAN, MD  Multiple Vitamin (MULTIVITAMIN WITH MINERALS) TABS tablet Take 1 tablet by mouth daily. 12/06/22   Elgergawy, Brayton RAMAN, MD  ondansetron  (ZOFRAN -ODT) 4 MG disintegrating tablet Take 1 tablet (4 mg total) by mouth every 8 (eight) hours as needed. 08/03/23   Hildegard Loge, PA-C  pantoprazole  (PROTONIX ) 20 MG tablet Take 1 tablet (20 mg total) by mouth daily for 14 days. 08/01/23 08/16/23  Elnor Jayson LABOR, DO  potassium chloride  SA (KLOR-CON  M) 20 MEQ tablet Take 1 tablet (20 mEq total) by mouth daily for 5 days. 08/01/23 08/07/23  Elnor Jayson LABOR, DO  sucralfate  (CARAFATE ) 1 g tablet Take 1 tablet (1 g total) by mouth with breakfast, with lunch, and with evening meal for 7 days. 08/01/23 08/09/23  Elnor Jayson A, DO  thiamine  (VITAMIN B1) 100 MG tablet Take 1 tablet (100 mg total) by mouth daily. 12/10/22   Elgergawy, Brayton RAMAN, MD     Critical care time:       Leita SAUNDERS Refujio Haymer, PA-C [facility] See Amion for pager If no response to pager , please call [phone] until 7pm After 7:00 pm call Elink  [phone]

## 2023-12-19 NOTE — Progress Notes (Signed)
 Inpatient Rehab Admissions Coordinator:   Therapy recommendations for CIR.  Pt may be a candidate.  I've asked PMR MDs to review for medical necessity.   Reche Lowers, PT, DPT Admissions Coordinator [phone] 12/19/23  4:40 PM

## 2023-12-19 NOTE — Progress Notes (Addendum)
 eLink Physician-Brief Progress Note Patient Name: Derek Cruz DOB: [birth year] MRN: [id number]   Date of Service  12/19/2023  HPI/Events of Note  58 y.o. male who has a PMH including but not limited to alcohol  abuse, hypertension, fatty liver, CKD who presents with some concern for early Dts.   Severe hypertriglyceridemia Unable to process blood in the lab likely secondary to the triglyceridemia  eICU Interventions  Start insulin  infusion, monitor triglycerides daily Obtain an i-STAT for electrolytes   0500 -VBG consistent with hypokalemia, sodium up to 128 on i-STAT, increase free water solution to 100 cc/h  0644 -glucose going low to 70s at this point despite 100 cc of dextrose , triglycerides now 2200s, decrease rate of insulin .  Intervention Category Minor Interventions: Routine modifications to care plan (e.g. PRN medications for pain, fever)  Rafeal Skibicki 12/19/2023, 2:41 AM

## 2023-12-19 NOTE — Progress Notes (Signed)
 Patients mental status has improved over day.  Has ambulated in halls once and in room 2 times.  Appetite has improved.  Patient to stay on insulin  gtts due to labs.

## 2023-12-20 ENCOUNTER — Inpatient Hospital Stay (HOSPITAL_COMMUNITY): Payer: Self-pay

## 2023-12-20 LAB — BASIC METABOLIC PANEL WITH GFR
Anion gap: 10 (ref 5–15)
BUN: <5 mg/dL — ABNORMAL LOW (ref 6–20)
CO2: 21 mmol/L — ABNORMAL LOW (ref 22–32)
Calcium: 8.1 mg/dL — ABNORMAL LOW (ref 8.9–10.3)
Chloride: 97 mmol/L — ABNORMAL LOW (ref 98–111)
Creatinine, Ser: 0.71 mg/dL (ref 0.61–1.24)
GFR, Estimated: >60 mL/min (ref >60)
Glucose, Bld: 230 mg/dL — ABNORMAL HIGH (ref 70–99)
Potassium: 3.2 mmol/L — ABNORMAL LOW (ref 3.5–5.1)
Sodium: 128 mmol/L — ABNORMAL LOW (ref 135–145)

## 2023-12-20 LAB — GLUCOSE, CAPILLARY
Glucose-Capillary: 150 mg/dL — ABNORMAL HIGH (ref 70–99)
Glucose-Capillary: 182 mg/dL — ABNORMAL HIGH (ref 70–99)
Glucose-Capillary: 188 mg/dL — ABNORMAL HIGH (ref 70–99)
Glucose-Capillary: 190 mg/dL — ABNORMAL HIGH (ref 70–99)
Glucose-Capillary: 204 mg/dL — ABNORMAL HIGH (ref 70–99)
Glucose-Capillary: 217 mg/dL — ABNORMAL HIGH (ref 70–99)
Glucose-Capillary: 225 mg/dL — ABNORMAL HIGH (ref 70–99)
Glucose-Capillary: 236 mg/dL — ABNORMAL HIGH (ref 70–99)
Glucose-Capillary: 240 mg/dL — ABNORMAL HIGH (ref 70–99)
Glucose-Capillary: 246 mg/dL — ABNORMAL HIGH (ref 70–99)
Glucose-Capillary: 248 mg/dL — ABNORMAL HIGH (ref 70–99)
Glucose-Capillary: 249 mg/dL — ABNORMAL HIGH (ref 70–99)
Glucose-Capillary: 251 mg/dL — ABNORMAL HIGH (ref 70–99)
Glucose-Capillary: 255 mg/dL — ABNORMAL HIGH (ref 70–99)

## 2023-12-20 LAB — HEPATIC FUNCTION PANEL
ALT: 105 U/L — ABNORMAL HIGH (ref 0–44)
AST: 163 U/L — ABNORMAL HIGH (ref 15–41)
Albumin: 2 g/dL — ABNORMAL LOW (ref 3.5–5.0)
Alkaline Phosphatase: 302 U/L — ABNORMAL HIGH (ref 38–126)
Bilirubin, Direct: 3.6 mg/dL — ABNORMAL HIGH (ref 0.0–0.2)
Indirect Bilirubin: 2 mg/dL — ABNORMAL HIGH (ref 0.3–0.9)
Total Bilirubin: 5.6 mg/dL — ABNORMAL HIGH (ref 0.0–1.2)
Total Protein: 5 g/dL — ABNORMAL LOW (ref 6.5–8.1)

## 2023-12-20 LAB — CBC
HCT: 33.7 % — ABNORMAL LOW (ref 39.0–52.0)
Hemoglobin: 11.9 g/dL — ABNORMAL LOW (ref 13.0–17.0)
MCH: 31 pg (ref 26.0–34.0)
MCHC: 35.3 g/dL (ref 30.0–36.0)
MCV: 87.8 fL (ref 80.0–100.0)
Platelets: 82 K/uL — ABNORMAL LOW (ref 150–400)
RBC: 3.84 MIL/uL — ABNORMAL LOW (ref 4.22–5.81)
RDW: 14.1 % (ref 11.5–15.5)
WBC: 5.3 K/uL (ref 4.0–10.5)
nRBC: 0.4 % — ABNORMAL HIGH (ref 0.0–0.2)

## 2023-12-20 LAB — MAGNESIUM: Magnesium: 1.7 mg/dL (ref 1.7–2.4)

## 2023-12-20 LAB — SODIUM: Sodium: 127 mmol/L — ABNORMAL LOW (ref 135–145)

## 2023-12-20 LAB — TRIGLYCERIDES: Triglycerides: 1357 mg/dL — ABNORMAL HIGH (ref <150)

## 2023-12-20 LAB — PHOSPHORUS: Phosphorus: 2.1 mg/dL — ABNORMAL LOW (ref 2.5–4.6)

## 2023-12-20 MED ORDER — DEXTROSE-SODIUM CHLORIDE 5-0.45 % IV SOLN: INTRAVENOUS | Status: DC

## 2023-12-20 MED ORDER — POTASSIUM CHLORIDE 10 MEQ/100ML IV SOLN
10.0000 meq | INTRAVENOUS | Status: AC
  Administered 2023-12-20 (×6): 10 meq via INTRAVENOUS
  Filled 2023-12-20 (×4): qty 100

## 2023-12-20 MED ORDER — MAGNESIUM SULFATE 4 GM/100ML IV SOLN
4.0000 g | Freq: Once | INTRAVENOUS | Status: AC
  Administered 2023-12-20: 4 g via INTRAVENOUS
  Filled 2023-12-20: qty 100

## 2023-12-20 NOTE — Progress Notes (Signed)
 Physical Therapy Treatment Patient Details Name: Derek Cruz MRN: [id number] DOB: 1965/12/28 Today's Date: 12/20/2023   History of Present Illness Pt is 58 yo presenting to [facility] on 7/30 due to 3 days of N/V/D associated with intermittent abdominal pain and dyspnea. PMH: ETOH abuse, HTN, fatty liver, CKD.    PT Comments  Pt limited in safe mobility today by headache and increased HR with mobilization. Pt is supervision for bed mobility and  minA for steadying in RW in standing. Pt able to ambulate with improved stability using RW needing CGA. Pt requesting to get back to bed, PT educated on benefits of sitting up and pt agreeable to sit until lunch comes. PT will continue to follow acutely. D/c plans remain appropriate at this time.      If plan is discharge home, recommend the following: A lot of help with walking and/or transfers;Help with stairs or ramp for entrance;Assist for transportation;Assistance with cooking/housework   Can travel by private vehicle      Yes  Equipment Recommendations  Rolling walker (2 wheels);BSC/3in1    Recommendations for Other Services Rehab consult;OT consult     Precautions / Restrictions Precautions Precautions: Fall Recall of Precautions/Restrictions: Impaired Restrictions Weight Bearing Restrictions Per Provider Order: No     Mobility  Bed Mobility Overal bed mobility: Needs Assistance Bed Mobility: Supine to Sit     Supine to sit: Supervision     General bed mobility comments: increased time and effort and use of bed rail but pt able to come to seated EoB    Transfers Overall transfer level: Needs assistance Equipment used: None, Rolling walker (2 wheels) Transfers: Sit to/from Stand Sit to Stand: Min assist           General transfer comment: min A for balance, request RW for support and contact guard    Ambulation/Gait Ambulation/Gait assistance: Contact guard assist Gait Distance (Feet): 120 Feet Assistive  device: Rolling walker (2 wheels) Gait Pattern/deviations: Step-through pattern, Wide base of support, Staggering left, Decreased stride length, Decreased dorsiflexion - right, Decreased dorsiflexion - left, Ataxic Gait velocity: decreased Gait velocity interpretation: <1.31 ft/sec, indicative of household ambulator   General Gait Details: increased balance with RW, requires increased cuing for use of RW around obstacles         Balance Overall balance assessment: Needs assistance Sitting-balance support: Bilateral upper extremity supported, Feet unsupported Sitting balance-Leahy Scale: Fair   Postural control: Posterior lean, Left lateral lean Standing balance support: No upper extremity supported, During functional activity Standing balance-Leahy Scale: Poor Standing balance comment: Requires Min A to prevent falls multiple times                            Communication Communication Communication: No apparent difficulties (pt declined spanish interpreter)  Cognition Arousal: Alert Behavior During Therapy: WFL for tasks assessed/performed   PT - Cognitive impairments: No apparent impairments                         Following commands: Intact      Cueing Cueing Techniques: Verbal cues  Exercises      General Comments General comments (skin integrity, edema, etc.): HR initially up to 134 bpm with standing BP 132/89, after ambulation HR 113bpm, BP 111/76      Pertinent Vitals/Pain Pain Assessment Pain Assessment: Faces Faces Pain Scale: Hurts little more Pain Location: headache Pain Descriptors / Indicators: Aching Pain  Intervention(s): Limited activity within patient's tolerance, Monitored during session, Repositioned     PT Goals (current goals can now be found in the care plan section) Acute Rehab PT Goals Patient Stated Goal: to return to work and get stronger PT Goal Formulation: With patient Time For Goal Achievement:  01/02/24 Potential to Achieve Goals: Good Progress towards PT goals: Progressing toward goals    Frequency    Min 3X/week              AM-PAC PT 6 Clicks Mobility   Outcome Measure  Help needed turning from your back to your side while in a flat bed without using bedrails?: A Little Help needed moving from lying on your back to sitting on the side of a flat bed without using bedrails?: A Little Help needed moving to and from a bed to a chair (including a wheelchair)?: A Little Help needed standing up from a chair using your arms (e.g., wheelchair or bedside chair)?: A Little Help needed to walk in hospital room?: A Lot Help needed climbing 3-5 steps with a railing? : A Lot 6 Click Score: 16    End of Session Equipment Utilized During Treatment: Gait belt Activity Tolerance: Patient tolerated treatment well Patient left: with nursing/sitter in room Nurse Communication: Mobility status PT Visit Diagnosis: Other abnormalities of gait and mobility (R26.89);Unsteadiness on feet (R26.81);Ataxic gait (R26.0)     Time: 8943-8874 PT Time Calculation (min) (ACUTE ONLY): 29 min  Charges:    $Gait Training: 8-22 mins $Therapeutic Activity: 8-22 mins PT General Charges $$ ACUTE PT VISIT: 1 Visit                     Derek Cruz PT, DPT Acute Rehabilitation Services Please use secure chat or  Call Office [phone]    [facility] 12/20/2023, 3:27 PM

## 2023-12-20 NOTE — Progress Notes (Signed)
 eLink Physician-Brief Progress Note Patient Name: Derek Cruz DOB: [birth year] MRN: [id number]   Date of Service  12/20/2023  HPI/Events of Note  K 3.2, Mg 1.7, Phos 2.1  eICU Interventions  Continue previous Phos-Nak Additional KCl, magnesium      Intervention Category Minor Interventions: Electrolytes abnormality - evaluation and management  Breyah Akhter 12/20/2023, 6:14 AM

## 2023-12-20 NOTE — Progress Notes (Addendum)
 NAME:  Derek Cruz, MRN:  [id number], DOB:  09/05/1965, LOS: 2 ADMISSION DATE:  12/17/2023, CONSULTATION DATE:  12/18/23 REFERRING MD:  Dr. Charlyn, CHIEF COMPLAINT:  Alcohol  Withdrawal   History of Present Illness:  Pt is encephelopathic; therefore, this HPI is obtained from chart review. Derek Cruz is a 58 y.o. male who has a PMH including but not limited to alcohol  abuse, hypertension, fatty liver, CKD.  He presented to [facility] ED on 12/17/2023 with N/V/D for 3 days associated with intermittent abdominal pain and dyspnea.  He apparently had his last drink 2 days prior and he typically drinks 4-5 tall boys sized beers daily (25oz beers).  Due to N/V/D, he has not been able to drink.  He notes that he started to have tremors the day prior to presentation which is typical for him if he does not drink his normal amount.  He has had DTs in the past per his report and apparently is interested in trying to stop drinking completely.  He has had similar presentations to the ED in the past, most recently in March of this year.   Due to high census in the ED, he had a long wait in triage, over 10 hours.  Upon his initial lab draws, he was noted to have multiple metabolic derangements therefore he was sent back for repeat labs. Repeat labs confirmed multiple metabolic derangements including sodium 115, potassium 6.5, calcium  0.78.  He had received 4 mg of Ativan  over the past several hours and due to concern for high risk of withdrawals, PCCM was called for admission and consideration of Precedex.  At the time of my evaluation, he is resting comfortably, he does awaken during our exam but is encephalopathic after Ativan . Per RN, once he wakes up, he becomes agitated and is pulling at lines and attempting to get out of bed.  Pertinent  Medical History  Chest pain; Uncontrolled type 2 diabetes mellitus with hyperglycemia, without long-term current use of insulin  (HCC); Hyperlipidemia;  Gastroesophageal reflux disease without esophagitis; Chest discomfort; Essential hypertension; Pre-diabetes; Neck pain; Muscle strain; Undifferentiated abdominal pain; Hemoglobin A1c less than 7.0%; Liver failure (HCC); Alcoholic hepatitis; Hyponatremia; Polycythemia; Elevated liver enzymes; Alcohol  abuse; Alcohol  use disorder; Frequent falls; Alcohol  withdrawal (HCC); Abnormal TSH; Hypokalemia; and CKD (chronic kidney disease), stage III (HCC) on their problem list.  Significant Hospital Events: Including procedures, antibiotic start and stop dates in addition to other pertinent events   7/30 Admit 7/31 Completed phenobarbital  taper  Interim History / Subjective:  NAEON. Mentation has improved, is able to answer appropriately. Denies abdominal pain or nausea. Expresses difficulty swallowing food and liquids for about a month. Objective    Blood pressure 117/81, pulse (!) 123, temperature 99 F (37.2 C), temperature source Oral, resp. rate (!) 25, height 5' 5 (1.651 m), weight 69.2 kg, SpO2 94%.        Intake/Output Summary (Last 24 hours) at 12/20/2023 0857 Last data filed at 12/20/2023 0800 Gross per 24 hour  Intake 5911.66 ml  Output 1500 ml  Net 4411.66 ml   Filed Weights   12/17/23 1227 12/18/23 0442 12/20/23 0406  Weight: 76.2 kg 69.2 kg 69.2 kg    Examination: General: Resting in bed, easy to wake and alert in NAD HEENT: NCAT. Sclera anicteric. Cardiovascular: RRR. No M/R/G Respiratory: CTAB, normal WOB on RA. No wheezing, crackles, rhonchi, or diminished breath sounds. Abdomen: Soft, non-tender, non-distended. Bowel sounds normoactive Extremities: Able to move all extremities. No BLE edema, no  deformities or significant joint findings. Very mild tremor noted on exam. Skin: Warm and dry. No abrasions or rashes noted. No diaphoresis. Neuro: A&Ox3. No focal neurological deficits.  Resolved problem list   Assessment and Plan   Hx EtOH abuse with concern for withdrawal and  early developing DTs. S/p phenobarbital  taper. - Ativan  q4h per CIWAs - Continue MVI, Thiamine , and Folate, and Folic acid  - Continue ongoing cessation encouragement   Acute metabolic encephalopathy Presumed 2/2 benzos/Ativan  administration. Very low suspicion for intracranial process. - Improving, no signs of withdrawal today  Hyponatremia - 2/2 EtOH. Hypochloremia Hypokalemia Hypocalcemia Hypophosphatemia - Na 128, K 3.2, Mag 1.7, Phos 2.1, Ca 8.1 today - K and Mag supplemented this AM - Continue Phos-Nak TID  Severe Hypertriglyceridemia  >5,000 initially, improved to 1357, Cholesterol >500 - No clinical signs of pancreatitis - Continue D5 at 100 mL/hr - Continue insulin  gtt today and repeat triglyceride level daily, d/c insulin  gtt when TG <500 - Continue Atorvastatin  80mg  - Continue education on medication compliance and dietary changes  Dysphagia - Barium swallow to assess  Patient has clinically improved and able to transfer out of the ICU today. Will sign out patient to TRH.   Best Practice (right click and Reselect all SmartList Selections daily)   Diet/type: Regular consistency (see orders) DVT prophylaxis prophylactic heparin   Pressure ulcer(s): pressure ulcer assessment deferred  GI prophylaxis: PPI Lines: N/A Foley:  N/A Code Status:  full code Last date of multidisciplinary goals of care discussion [improving]  Labs   CBC: Recent Labs  Lab 12/17/23 1308 12/18/23 0102 12/18/23 0456 12/18/23 1034 12/19/23 0415 12/19/23 0425 12/20/23 0420  WBC 3.0*  --  4.7  --  4.4  --  5.3  NEUTROABS 2.3  --   --   --   --   --   --   HGB 12.1*   < > 13.1 14.3 13.2 13.6 11.9*  HCT 34.3*   < > 36.5* 42.0 35.7* 40.0 33.7*  MCV 86.0  --  86.1  --  86.2  --  87.8  PLT 102*  --  157  --  78*  --  82*   < > = values in this interval not displayed.    Basic Metabolic Panel: Recent Labs  Lab 12/17/23 2215 12/18/23 0102 12/18/23 0456 12/18/23 0457  12/18/23 0458 12/18/23 1034 12/18/23 1304 12/19/23 0415 12/19/23 0425 12/19/23 0740 12/19/23 1031 12/19/23 1526 12/19/23 1840 12/19/23 2345 12/20/23 0420  NA 107* 115*  --  117*   < > 124*   < > 127* 128*   < > 126* 128* 129* 127* 128*  K RESULTS UNAVAILABLE DUE TO INTERFERING SUBSTANCE 6.5*  --  RESULTS UNAVAILABLE DUE TO INTERFERING SUBSTANCE  --  2.6*  --  2.9* 2.8*  --   --  4.0  --   --  3.2*  CL 72* 87*  --  83*  --   --   --  92*  --   --   --  97*  --   --  97*  CO2 19*  --   --  20*  --   --   --  23  --   --   --  18*  --   --  21*  GLUCOSE 193* 192*  --  193*  --   --   --  279*  --   --   --  195*  --   --  230*  BUN 10 13  --  7  --   --   --  <5*  --   --   --  <5*  --   --  <5*  CREATININE <0.30* 0.70  --  0.37*  --   --   --  0.66  --   --   --  0.76  --   --  0.71  CALCIUM  7.4*  --   --  7.6*  --   --   --  8.1*  --   --   --  8.4*  --   --  8.1*  MG  --   --  3.0*  --   --   --   --  RESULTS UNAVAILABLE DUE TO INTERFERING SUBSTANCE  --   --   --   --   --   --  1.7  PHOS  --   --  RESULTS UNAVAILABLE DUE TO INTERFERING SUBSTANCE  --   --   --   --  1.1*  --   --   --  2.1*  --   --  2.1*   < > = values in this interval not displayed.   GFR: Estimated Creatinine Clearance: 87.6 mL/min (by C-G formula based on SCr of 0.71 mg/dL). Recent Labs  Lab 12/17/23 1308 12/18/23 0456 12/19/23 0415 12/20/23 0420  WBC 3.0* 4.7 4.4 5.3    Liver Function Tests: Recent Labs  Lab 12/17/23 2215 12/18/23 0457 12/20/23 0420  AST RESULTS UNAVAILABLE DUE TO INTERFERING SUBSTANCE RESULTS UNAVAILABLE DUE TO INTERFERING SUBSTANCE 163*  ALT RESULTS UNAVAILABLE DUE TO INTERFERING SUBSTANCE RESULTS UNAVAILABLE DUE TO INTERFERING SUBSTANCE 105*  ALKPHOS RESULTS UNAVAILABLE DUE TO INTERFERING SUBSTANCE RESULTS UNAVAILABLE DUE TO INTERFERING SUBSTANCE 302*  BILITOT 11.6* RESULTS UNAVAILABLE DUE TO INTERFERING SUBSTANCE 5.6*  PROT RESULTS UNAVAILABLE DUE TO INTERFERING SUBSTANCE RESULTS  UNAVAILABLE DUE TO INTERFERING SUBSTANCE 5.0*  ALBUMIN 3.1* 2.5* 2.0*   Recent Labs  Lab 12/17/23 2215 12/19/23 0415  LIPASE 43 81*   No results for input(s): AMMONIA in the last 168 hours.  ABG    Component Value Date/Time   PHART 7.546 (H) 12/18/2023 1034   PCO2ART 33.9 12/18/2023 1034   PO2ART 73 (L) 12/18/2023 1034   HCO3 26.5 12/19/2023 0425   TCO2 28 12/19/2023 0425   ACIDBASEDEF 4.0 (H) 12/01/2022 0858   O2SAT 94 12/19/2023 0425     Coagulation Profile: No results for input(s): INR, PROTIME in the last 168 hours.  Cardiac Enzymes: No results for input(s): CKTOTAL, CKMB, CKMBINDEX, TROPONINI in the last 168 hours.  HbA1C: HbA1c, POC (prediabetic range)  Date/Time Value Ref Range Status  01/04/2020 12:33 PM 9.8 (A) 5.7 - 6.4 % Final   HbA1c, POC (controlled diabetic range)  Date/Time Value Ref Range Status  01/04/2020 12:33 PM 9.8 (A) 0.0 - 7.0 % Final   HbA1c POC (<> result, manual entry)  Date/Time Value Ref Range Status  01/04/2020 12:33 PM 9.8 4.0 - 5.6 % Final   Hgb A1c MFr Bld  Date/Time Value Ref Range Status  12/18/2023 04:57 AM 9.0 (H) 4.8 - 5.6 % Final    Comment:    (NOTE) Diagnosis of Diabetes The following HbA1c ranges recommended by the American Diabetes Association (ADA) may be used as an aid in the diagnosis of diabetes mellitus.  Hemoglobin             Suggested A1C NGSP%  Diagnosis  <5.7                   Non Diabetic  5.7-6.4                Pre-Diabetic  >6.4                   Diabetic  <7.0                   Glycemic control for                       adults with diabetes.    12/12/2022 06:56 AM 6.8 (H) 4.8 - 5.6 % Final    Comment:    (NOTE) Pre diabetes:          5.7%-6.4%  Diabetes:              >6.4%  Glycemic control for   <7.0% adults with diabetes     CBG: Recent Labs  Lab 12/20/23 0118 12/20/23 0202 12/20/23 0404 12/20/23 0558 12/20/23 0737  GLUCAP 236* 248* 246* 188* 150*     Review of Systems:   No N/V/D or abdominal pain  Past Medical History:  He,  has a past medical history of Alcohol  abuse, Chest pain, Dyslipidemia, Elevated liver enzymes, Gastritis, and Hypertension.   Surgical History:  History reviewed. No pertinent surgical history.   Social History:   reports that he has quit smoking. He has been exposed to tobacco smoke. He has never used smokeless tobacco. He reports current alcohol  use of about 3.0 standard drinks of alcohol  per week. He reports that he does not use drugs.   Family History:  His family history is negative for Diabetes Mellitus II, Stomach cancer, and Colon cancer.   Allergies No Known Allergies   Home Medications  Prior to Admission medications   Medication Sig Start Date End Date Taking? Authorizing Provider  chlordiazePOXIDE  (LIBRIUM ) 25 MG capsule Take 2 capsules (50mg ) by mouth 3 times a day for 1 day,  then 1-2 capsules (25-50mg ) twice a day for 1 day, then 1-2 capsules (25-50mg ) daily for 1 day Patient not taking: Reported on 12/18/2023 02/01/23   Yolande Lamar BROCKS, MD  folic acid  (FOLVITE ) 1 MG tablet Take 1 tablet (1 mg total) by mouth daily. Patient not taking: Reported on 12/18/2023 12/06/22   Elgergawy, Dawood S, MD  Melatonin 1 MG CHEW Chew 1 tablet by mouth at bedtime as needed. Patient not taking: Reported on 12/18/2023 08/03/23   Hildegard Loge, PA-C  metFORMIN  (GLUCOPHAGE -XR) 750 MG 24 hr tablet Take 2 tablets (1,500 mg total) by mouth daily with breakfast. Patient not taking: Reported on 12/08/2022 12/06/22 01/05/23  Elgergawy, Brayton RAMAN, MD  Multiple Vitamin (MULTIVITAMIN WITH MINERALS) TABS tablet Take 1 tablet by mouth daily. Patient not taking: Reported on 12/18/2023 12/06/22   Elgergawy, Brayton RAMAN, MD  ondansetron  (ZOFRAN -ODT) 4 MG disintegrating tablet Take 1 tablet (4 mg total) by mouth every 8 (eight) hours as needed. Patient not taking: Reported on 12/18/2023 08/03/23   Hildegard Loge, PA-C  pantoprazole  (PROTONIX )  20 MG tablet Take 1 tablet (20 mg total) by mouth daily for 14 days. 08/01/23 08/16/23  Elnor Jayson LABOR, DO  potassium chloride  SA (KLOR-CON  M) 20 MEQ tablet Take 1 tablet (20 mEq total) by mouth daily for 5 days. 08/01/23 08/07/23  Elnor Jayson LABOR, DO  sucralfate  (CARAFATE ) 1 g tablet Take 1 tablet (1 g total)  by mouth with breakfast, with lunch, and with evening meal for 7 days. 08/01/23 08/09/23  Elnor Jayson LABOR, DO  thiamine  (VITAMIN B1) 100 MG tablet Take 1 tablet (100 mg total) by mouth daily. Patient not taking: Reported on 12/18/2023 12/10/22   Elgergawy, Brayton RAMAN, MD

## 2023-12-20 NOTE — TOC Progression Note (Signed)
 Transition of Care [facility]) - Progression Note    Patient Details  Name: Derek Cruz MRN: [id number] Date of Birth: [birth year]  [facility] CM/SW Contact  [name], Sotiria Keast Daphne, RN Phone Number: 12/20/2023, 2:46 PM  Clinical Narrative:     CIR following for possible admit.   CM will continue to follow as patient progresses with care towards discharge.                      Expected Discharge Plan and Services                                               Social Drivers of Health (SDOH) Interventions SDOH Screenings   Food Insecurity: No Food Insecurity (12/18/2023)  Housing: Low Risk  (12/18/2023)  Transportation Needs: No Transportation Needs (12/18/2023)  Utilities: Not At Risk (12/18/2023)  Depression (PHQ2-9): Low Risk  (12/14/2022)  Financial Resource Strain: Medium Risk (07/15/2023)   [facility]  Social Connections: Unknown (12/18/2023)  Tobacco Use: Medium Risk (12/17/2023)    Readmission Risk Interventions    12/18/2023    2:47 PM  Readmission Risk Prevention Plan  Transportation Screening Complete  PCP or Specialist Appt within 5-7 Days Complete  Home Care Screening Complete  Medication Review (RN CM) Referral to Pharmacy

## 2023-12-20 NOTE — Progress Notes (Signed)
 Inpatient Rehab Admissions Coordinator:   Discussed with Dr. Emeline.  Will place a rehab consult per protocol.    Reche Lowers, PT, DPT Admissions Coordinator [phone] 12/20/23  1:33 PM

## 2023-12-20 NOTE — Plan of Care (Signed)
  Problem: Education: Goal: Knowledge of General Education information will improve Description: Including pain rating scale, medication(s)/side effects and non-pharmacologic comfort measures Outcome: Progressing   Problem: Clinical Measurements: Goal: Diagnostic test results will improve Outcome: Progressing   Problem: Nutrition: Goal: Adequate nutrition will be maintained Outcome: Progressing   Problem: Coping: Goal: Level of anxiety will decrease Outcome: Progressing   Problem: Elimination: Goal: Will not experience complications related to urinary retention Outcome: Progressing

## 2023-12-21 ENCOUNTER — Inpatient Hospital Stay (HOSPITAL_COMMUNITY): Payer: Self-pay

## 2023-12-21 LAB — GLUCOSE, CAPILLARY
Glucose-Capillary: 186 mg/dL — ABNORMAL HIGH (ref 70–99)
Glucose-Capillary: 123 mg/dL — ABNORMAL HIGH (ref 70–99)
Glucose-Capillary: 138 mg/dL — ABNORMAL HIGH (ref 70–99)
Glucose-Capillary: 267 mg/dL — ABNORMAL HIGH (ref 70–99)
Glucose-Capillary: 200 mg/dL — ABNORMAL HIGH (ref 70–99)
Glucose-Capillary: 211 mg/dL — ABNORMAL HIGH (ref 70–99)

## 2023-12-21 LAB — COMPREHENSIVE METABOLIC PANEL WITH GFR
ALT: 80 U/L — ABNORMAL HIGH (ref 0–44)
AST: 118 U/L — ABNORMAL HIGH (ref 15–41)
Albumin: 2.1 g/dL — ABNORMAL LOW (ref 3.5–5.0)
Alkaline Phosphatase: 286 U/L — ABNORMAL HIGH (ref 38–126)
Anion gap: 11 (ref 5–15)
BUN: <5 mg/dL — ABNORMAL LOW (ref 6–20)
CO2: 22 mmol/L (ref 22–32)
Calcium: 7.7 mg/dL — ABNORMAL LOW (ref 8.9–10.3)
Chloride: 98 mmol/L (ref 98–111)
Creatinine, Ser: 0.62 mg/dL (ref 0.61–1.24)
GFR, Estimated: >60 mL/min (ref >60)
Glucose, Bld: 134 mg/dL — ABNORMAL HIGH (ref 70–99)
Potassium: 3.3 mmol/L — ABNORMAL LOW (ref 3.5–5.1)
Sodium: 131 mmol/L — ABNORMAL LOW (ref 135–145)
Total Bilirubin: 4.6 mg/dL — ABNORMAL HIGH (ref 0.0–1.2)
Total Protein: 5 g/dL — ABNORMAL LOW (ref 6.5–8.1)

## 2023-12-21 LAB — LIPASE, BLOOD: Lipase: 74 U/L — ABNORMAL HIGH (ref 11–51)

## 2023-12-21 LAB — CBC
HCT: 30.7 % — ABNORMAL LOW (ref 39.0–52.0)
Hemoglobin: 10.8 g/dL — ABNORMAL LOW (ref 13.0–17.0)
MCH: 30.9 pg (ref 26.0–34.0)
MCHC: 35.2 g/dL (ref 30.0–36.0)
MCV: 87.7 fL (ref 80.0–100.0)
Platelets: 114 K/uL — ABNORMAL LOW (ref 150–400)
RBC: 3.5 MIL/uL — ABNORMAL LOW (ref 4.22–5.81)
RDW: 14.8 % (ref 11.5–15.5)
WBC: 6.3 K/uL (ref 4.0–10.5)
nRBC: 1.6 % — ABNORMAL HIGH (ref 0.0–0.2)

## 2023-12-21 LAB — MAGNESIUM: Magnesium: 1.9 mg/dL (ref 1.7–2.4)

## 2023-12-21 LAB — PROTIME-INR
INR: 1 (ref 0.8–1.2)
Prothrombin Time: 14.2 s (ref 11.4–15.2)

## 2023-12-21 LAB — PHOSPHORUS: Phosphorus: 2.8 mg/dL (ref 2.5–4.6)

## 2023-12-21 LAB — TRIGLYCERIDES: Triglycerides: 606 mg/dL — ABNORMAL HIGH (ref <150)

## 2023-12-21 MED ORDER — ROSUVASTATIN CALCIUM 20 MG PO TABS
20.0000 mg | ORAL_TABLET | Freq: Every day | ORAL | Status: DC
  Administered 2023-12-21 – 2023-12-24 (×4): 20 mg via ORAL
  Filled 2023-12-21 (×4): qty 1

## 2023-12-21 MED ORDER — LOPERAMIDE HCL 2 MG PO CAPS
4.0000 mg | ORAL_CAPSULE | ORAL | Status: DC | PRN
  Administered 2023-12-21 (×2): 4 mg via ORAL
  Filled 2023-12-21 (×2): qty 2

## 2023-12-21 MED ORDER — INSULIN ASPART 100 UNIT/ML IJ SOLN: 0.0000 [IU] | Freq: Every day | INTRAMUSCULAR | Status: DC

## 2023-12-21 MED ORDER — INSULIN ASPART 100 UNIT/ML IJ SOLN
0.0000 [IU] | Freq: Three times a day (TID) | INTRAMUSCULAR | Status: DC
  Administered 2023-12-21: 5 [IU] via SUBCUTANEOUS
  Administered 2023-12-21 – 2023-12-22 (×3): 2 [IU] via SUBCUTANEOUS
  Administered 2023-12-22: 1 [IU] via SUBCUTANEOUS
  Administered 2023-12-23: 3 [IU] via SUBCUTANEOUS
  Administered 2023-12-23: 5 [IU] via SUBCUTANEOUS
  Administered 2023-12-23: 1 [IU] via SUBCUTANEOUS
  Administered 2023-12-24: 2 [IU] via SUBCUTANEOUS

## 2023-12-21 MED ORDER — INSULIN ASPART 100 UNIT/ML IJ SOLN
0.0000 [IU] | Freq: Every day | INTRAMUSCULAR | Status: DC
  Administered 2023-12-21 – 2023-12-24 (×3): 2 [IU] via SUBCUTANEOUS

## 2023-12-21 MED ORDER — FENOFIBRATE 160 MG PO TABS
160.0000 mg | ORAL_TABLET | Freq: Every day | ORAL | Status: DC
  Administered 2023-12-21 – 2023-12-24 (×4): 160 mg via ORAL
  Filled 2023-12-21 (×4): qty 1

## 2023-12-21 MED ORDER — ROSUVASTATIN CALCIUM 5 MG PO TABS: 10.0000 mg | ORAL_TABLET | Freq: Every day | ORAL | Status: DC

## 2023-12-21 MED ORDER — POTASSIUM CHLORIDE CRYS ER 20 MEQ PO TBCR
40.0000 meq | EXTENDED_RELEASE_TABLET | Freq: Once | ORAL | Status: AC
  Administered 2023-12-21: 40 meq via ORAL
  Filled 2023-12-21: qty 2

## 2023-12-21 MED ORDER — ACETAMINOPHEN 325 MG PO TABS
650.0000 mg | ORAL_TABLET | Freq: Four times a day (QID) | ORAL | Status: DC | PRN
  Administered 2023-12-21 – 2023-12-24 (×2): 650 mg via ORAL
  Filled 2023-12-21 (×2): qty 2

## 2023-12-21 MED ORDER — INSULIN ASPART 100 UNIT/ML IJ SOLN: 0.0000 [IU] | Freq: Three times a day (TID) | INTRAMUSCULAR | Status: DC

## 2023-12-21 MED ORDER — LACTATED RINGERS IV SOLN: INTRAVENOUS | Status: AC

## 2023-12-21 MED ORDER — INSULIN GLARGINE-YFGN 100 UNIT/ML ~~LOC~~ SOLN
20.0000 [IU] | Freq: Every day | SUBCUTANEOUS | Status: DC
  Filled 2023-12-21: qty 0.2

## 2023-12-21 MED ORDER — CHLORDIAZEPOXIDE HCL 5 MG PO CAPS
15.0000 mg | ORAL_CAPSULE | Freq: Three times a day (TID) | ORAL | Status: DC
  Administered 2023-12-21 – 2023-12-24 (×10): 15 mg via ORAL
  Filled 2023-12-21 (×10): qty 3

## 2023-12-21 NOTE — Progress Notes (Signed)
 1847 patient has small amount bright red blood when wiping not in stool cream applied to inner buttocks and given to patient for use

## 2023-12-21 NOTE — Progress Notes (Signed)
 PROGRESS NOTE                                                                                                                                                                                                             Patient Demographics:    Derek Cruz, is a 58 y.o. male, DOB - 10-07-1965, FMW:[id number]  Outpatient Primary MD for the patient is Lorren Greig PARAS, NP    LOS - 3  Admit date - 12/17/2023    Chief Complaint  Patient presents with   Withdrawal       Brief Narrative (HPI from H&P)   58 y.o. male who has a PMH including but not limited to alcohol  abuse, hypertension, fatty liver, DM type II, CKD 2. He presented to [facility] ED on 12/17/2023 with N/V/D for 3 days associated with intermittent abdominal pain and dyspnea.  He was diagnosed with acute pancreatitis in the setting of alcohol  abuse and elevated triglycerides and admitted to ICU   Subjective:    Derek Cruz today has, No headache, No chest pain, improved abdominal pain - No Nausea, No new weakness tingling or numbness, no shortness of breath   Assessment  & Plan :   Ongoing alcohol  abuse, alcohol  intoxication, alcoholic pancreatitis, mild to moderate elevation of triglycerides.  Was in DTs, DTs have improved, was given bowel rest and IV fluids along with IV insulin  drip for moderately elevated triglycerides, right upper quadrant ultrasound is stable, he does appear to have underlying liver injury from ongoing alcohol  abuse, CBD on the ultrasound is stable, LFTs are trending down.  Abdominal pain has improved  Overall clinically better continue IV fluids, discontinue insulin  drip, low carbohydrate diet, placed on Crestor  and Tricor  for triglycerides, monitor abdominal pain and LFTs.  Strictly counseled to quit from alcohol .  CIWA protocol and Librium  will be continued.  Postdischarge outpatient PCP and GI  follow-up.  Dyslipidemia with moderately elevated triglycerides.  Crestor  and Tricor , low carbohydrate heart healthy diet.  Abstain from alcohol .  Hypokalemia, hypomagnesemia, hypophosphatemia.  Replaced.  Dehydration with hyponatremia.  Hydrate with IV fluids.  Dysphagia.  Stable barium swallow.  Speech to follow.  Acute metabolic encephalopathy due to alcohol  intoxication and withdrawal.  Stable, monitor with supportive care.      Condition - Extremely Guarded  Family Communication  : None present  Code Status : Full code  Consults  : PCCM  PUD Prophylaxis : PPI   Procedures  :     US  - 1. No evidence of cholelithiasis or acute cholecystitis. 2. Diffuse fatty infiltration of the liver.       Disposition Plan  :    Status is: Inpatient  DVT Prophylaxis  :    heparin  injection 5,000 Units Start: 12/18/23 1400 SCDs Start: 12/18/23 0309    Lab Results  Component Value Date   PLT 114 (L) 12/21/2023    Diet :  Diet Order             Diet heart healthy/carb modified Fluid consistency: Thin; Fluid restriction: 1500 mL Fluid  Diet effective now                    Inpatient Medications  Scheduled Meds:  chlordiazePOXIDE   15 mg Oral TID   Chlorhexidine  Gluconate Cloth  6 each Topical Daily   feeding supplement  237 mL Oral BID BM   fenofibrate   160 mg Oral Daily   folic acid   1 mg Oral Daily   heparin   5,000 Units Subcutaneous Q8H   insulin  aspart  0-15 Units Subcutaneous TID WC   insulin  aspart  0-5 Units Subcutaneous QHS   insulin  glargine-yfgn  20 Units Subcutaneous Daily   LORazepam   0-4 mg Oral Q4H   Followed by   LORazepam   0-4 mg Oral Q8H   multivitamin with minerals  1 tablet Oral Daily   pantoprazole   40 mg Oral QHS   potassium & sodium phosphates   1 packet Oral TID WC & HS   potassium chloride   40 mEq Oral Once   rosuvastatin   20 mg Oral Daily   thiamine   100 mg Oral Daily   Or   thiamine   100 mg Intravenous Daily   Continuous  Infusions:  dextrose  5 % and 0.45 % NaCl 100 mL/hr at 12/21/23 0914   insulin  0.1 Units/kg/hr (12/21/23 0800)   lactated ringers      PRN Meds:.docusate sodium , LORazepam , mouth rinse, phenol, polyethylene glycol  Antibiotics  :    Anti-infectives (From admission, onward)    None         Objective:   Vitals:   12/21/23 0015 12/21/23 0615 12/21/23 0800 12/21/23 0816  BP: 121/84 105/64 117/72   Pulse: 99 (!) 104 (!) 105   Resp:   16   Temp:  98.7 F (37.1 C)  99.2 F (37.3 C)  TempSrc:  Oral  Oral  SpO2: 96% 92%    Weight:      Height:        Wt Readings from Last 3 Encounters:  12/20/23 69.2 kg  08/03/23 77 kg  07/29/23 76.2 kg     Intake/Output Summary (Last 24 hours) at 12/21/2023 0952 Last data filed at 12/21/2023 0800 Gross per 24 hour  Intake 3873.31 ml  Output 700 ml  Net 3173.31 ml     Physical Exam  Awake Alert, No new F.N deficits, Normal affect [place].AT,PERRAL Supple Neck, No JVD,   Symmetrical Chest wall movement, Good air movement bilaterally, CTAB RRR,No Gallops,Rubs or new Murmurs,  +ve B.Sounds, Abd Soft, No tenderness,   No Cyanosis, Clubbing or edema      Data Review:    Recent Labs  Lab 12/17/23 1308 12/18/23 0102 12/18/23 0456 12/18/23 1034 12/19/23 0415 12/19/23 0425 12/20/23 0420 12/21/23 0801  WBC 3.0*  --  4.7  --  4.4  --  5.3 6.3  HGB 12.1*   < > 13.1 14.3 13.2 13.6 11.9* 10.8*  HCT 34.3*   < > 36.5* 42.0 35.7* 40.0 33.7* 30.7*  PLT 102*  --  157  --  78*  --  82* 114*  MCV 86.0  --  86.1  --  86.2  --  87.8 87.7  MCH 30.3  --  30.9  --  31.9  --  31.0 30.9  MCHC 35.3  --  35.9  --  37.0*  --  35.3 35.2  RDW 14.5  --  14.2  --  13.4  --  14.1 14.8  LYMPHSABS 0.5*  --   --   --   --   --   --   --   MONOABS 0.2  --   --   --   --   --   --   --   EOSABS 0.0  --   --   --   --   --   --   --   BASOSABS 0.0  --   --   --   --   --   --   --    < > = values in this interval not displayed.    Recent Labs  Lab  12/17/23 2215 12/18/23 0102 12/18/23 0456 12/18/23 0457 12/18/23 0458 12/19/23 0415 12/19/23 0425 12/19/23 0740 12/19/23 1526 12/19/23 1840 12/19/23 2345 12/20/23 0420 12/21/23 0801  NA 107*   < >  --  117*   < > 127* 128*   < > 128* 129* 127* 128* 131*  K RESULTS UNAVAILABLE DUE TO INTERFERING SUBSTANCE   < >  --  RESULTS UNAVAILABLE DUE TO INTERFERING SUBSTANCE   < > 2.9* 2.8*  --  4.0  --   --  3.2* 3.3*  CL 72*   < >  --  83*  --  92*  --   --  97*  --   --  97* 98  CO2 19*  --   --  20*  --  23  --   --  18*  --   --  21* 22  ANIONGAP 16*  --   --  14  --  12  --   --  13  --   --  10 11  GLUCOSE 193*   < >  --  193*  --  279*  --   --  195*  --   --  230* 134*  BUN 10   < >  --  7  --  <5*  --   --  <5*  --   --  <5* <5*  CREATININE <0.30*   < >  --  0.37*  --  0.66  --   --  0.76  --   --  0.71 0.62  AST RESULTS UNAVAILABLE DUE TO INTERFERING SUBSTANCE  --   --  RESULTS UNAVAILABLE DUE TO INTERFERING SUBSTANCE  --   --   --   --   --   --   --  163* 118*  ALT RESULTS UNAVAILABLE DUE TO INTERFERING SUBSTANCE  --   --  RESULTS UNAVAILABLE DUE TO INTERFERING SUBSTANCE  --   --   --   --   --   --   --  105* 80*  ALKPHOS RESULTS UNAVAILABLE DUE TO INTERFERING SUBSTANCE  --   --  RESULTS UNAVAILABLE DUE TO INTERFERING SUBSTANCE  --   --   --   --   --   --   --  302* 286*  BILITOT 11.6*  --   --  RESULTS UNAVAILABLE DUE TO INTERFERING SUBSTANCE  --   --   --   --   --   --   --  5.6* 4.6*  ALBUMIN 3.1*  --   --  2.5*  --   --   --   --   --   --   --  2.0* 2.1*  INR  --   --   --   --   --   --   --   --   --   --   --   --  1.0  TSH  --   --   --  1.640  --   --   --   --   --   --   --   --   --   HGBA1C  --   --   --  9.0*  --   --   --   --   --   --   --   --   --   MG  --   --  3.0*  --   --  RESULTS UNAVAILABLE DUE TO INTERFERING SUBSTANCE  --   --   --   --   --  1.7 1.9  PHOS  --   --  RESULTS UNAVAILABLE DUE TO INTERFERING SUBSTANCE  --   --  1.1*  --   --  2.1*  --   --   2.1* 2.8  CALCIUM  7.4*  --   --  7.6*  --  8.1*  --   --  8.4*  --   --  8.1* 7.7*   < > = values in this interval not displayed.      Recent Labs  Lab 12/18/23 0456 12/18/23 0457 12/19/23 0415 12/19/23 1526 12/20/23 0420 12/21/23 0801  INR  --   --   --   --   --  1.0  TSH  --  1.640  --   --   --   --   HGBA1C  --  9.0*  --   --   --   --   MG 3.0*  --  RESULTS UNAVAILABLE DUE TO INTERFERING SUBSTANCE  --  1.7 1.9  CALCIUM   --  7.6* 8.1* 8.4* 8.1* 7.7*    --------------------------------------------------------------------------------------------------------------- Lab Results  Component Value Date   CHOL 587 (H) 12/19/2023   HDL NOT REPORTED DUE TO HIGH TRIGLYCERIDES 12/19/2023   LDLCALC UNABLE TO CALCULATE IF TRIGLYCERIDE OVER 400 mg/dL 92/68/7974   LDLDIRECT UNABLE TO CALCULATE IF TRIGLYCERIDE IS >1293 mg/dL 92/68/7974   TRIG 393 (H) 12/21/2023   CHOLHDL NOT REPORTED DUE TO HIGH TRIGLYCERIDES 12/19/2023    Lab Results  Component Value Date   HGBA1C 9.0 (H) 12/18/2023      Micro Results Recent Results (from the past 240 hours)  MRSA Next Gen by PCR, Nasal     Status: None   Collection Time: 12/18/23  3:09 AM   Specimen: Nasal Mucosa; Nasal Swab  Result Value Ref Range Status   MRSA by PCR Next Gen NOT DETECTED NOT DETECTED Final    Comment: (NOTE) The GeneXpert MRSA Assay (FDA approved for NASAL specimens only), is one component of a comprehensive MRSA colonization surveillance program. It is not intended to diagnose MRSA infection nor to guide or monitor treatment for MRSA infections. Test performance is not FDA approved in patients less than  58 years old. Performed at [facility], [address], [place], [place] [address]     Radiology Report DG Chest Sundown 1 View Result Date: 12/21/2023 CLINICAL DATA:  58 year old male with shortness of breath. Status post esophagram yesterday. EXAM: PORTABLE CHEST 1 VIEW COMPARISON:  Chest radiograph 12/17/2023  and earlier. FINDINGS: Portable AP semi upright view at 0709 hours. Mildly lower lung volumes. Normal cardiac size and mediastinal contours. Visualized tracheal air column is within normal limits. Allowing for portable technique the lungs are clear. No pneumothorax or pleural effusion. Barium type contrast at the splenic flexure, partially visible and otherwise paucity of bowel gas in the upper abdomen. No acute osseous abnormality identified. IMPRESSION: Negative portable chest. Electronically Signed   By: VEAR Hurst M.D.   On: 12/21/2023 07:57   DG ESOPHAGUS W SINGLE CM (SOL OR THIN BA) Result Date: 12/20/2023 CLINICAL DATA:  History of alcohol  abuse. In the hospital for alcohol  withdrawal, encephalopathy, electrolyte abnormalities. Has been having nausea, vomiting, diarrhea, dysphagia. IR consulted for esophagram given difficulty swallowing. EXAM: ESOPHAGUS/BARIUM SWALLOW/TABLET STUDY TECHNIQUE: Single contrast examination was performed using thin liquid barium. Exam significantly limited by patient immobility. This exam was performed by Kimble Clas, PA-C, and was supervised and interpreted by Dr. LOISE Molt. FLUOROSCOPY: Radiation Exposure Index (as provided by the fluoroscopic device): 17.00 mGy Kerma COMPARISON:  None Available. FINDINGS: Swallowing: Not directly assessed due to limited study. Pharynx: Not directly assessed due to limited study. Esophagus: Normal appearance. Esophageal motility: Within normal limits. Hiatal Hernia: None. Gastroesophageal reflux: Moderate amount of reflux to the mid esophagus Ingested 13 mm barium tablet: Passed normally Other: None. IMPRESSION: Limited study. Moderate gastroesophageal reflux. Otherwise unremarkable esophagram. Electronically Signed   By: Ree Molt M.D.   On: 12/20/2023 14:35     Signature  -   Lavada Stank M.D on 12/21/2023 at 9:52 AM   -  To page go to [email or web address]

## 2023-12-21 NOTE — Progress Notes (Signed)
 Physical Therapy Treatment Patient Details Name: Derek Cruz MRN: [id number] DOB: 10/28/1965 Today's Date: 12/21/2023   History of Present Illness Pt is 58 yo presenting [facility] on 7/[facility] due to 3 days of N/V/D associated with intermittent abdominal pain and dyspnea. PMH: ETOH abuse, HTN, fatty liver, CKD.    PT Comments  Pt tolerated treatment well today. Pt today was able to progress ambulation in hallway today with no AD CGA and navigated stairs with Min A. No change in DC/DME recs at this time. PT will continue to follow.     If plan is discharge home, recommend the following: A lot of help with walking and/or transfers;Help with stairs or ramp for entrance;Assist for transportation;Assistance with cooking/housework   Can travel by private Automotive engineer (2 wheels);BSC/3in1    Recommendations for Other Services       Precautions / Restrictions Precautions Precautions: Fall Recall of Precautions/Restrictions: Impaired Restrictions Weight Bearing Restrictions Per Provider Order: No     Mobility  Bed Mobility Overal bed mobility: Needs Assistance Bed Mobility: Supine to Sit, Sit to Supine     Supine to sit: Supervision Sit to supine: Supervision   General bed mobility comments: increased time and effort and use of bed rail but pt able to come to seated EoB    Transfers Overall transfer level: Needs assistance Equipment used: None Transfers: Sit to/from Stand Sit to Stand: Supervision           General transfer comment: no LOB noted.    Ambulation/Gait Ambulation/Gait assistance: Contact guard assist Gait Distance (Feet): 150 Feet Assistive device: None Gait Pattern/deviations: Step-through pattern, Wide base of support, Staggering left, Decreased stride length, Decreased dorsiflexion - right, Decreased dorsiflexion - left, Ataxic Gait velocity: decreased     General Gait Details: Pt declined use of AD  today. Pt overall steady however few episodes of ataxia that required CGA to correct. Pt mostly with slow cautious gait.   Stairs Stairs: Yes Stairs assistance: Min assist Stair Management: One rail Right, Step to pattern, Forwards Number of Stairs: 4 General stair comments: Min A for balance on stairs. Cues for sequencing.   Wheelchair Mobility     Tilt Bed    Modified Rankin (Stroke Patients Only)       Balance Overall balance assessment: Needs assistance Sitting-balance support: Bilateral upper extremity supported, Feet unsupported Sitting balance-Leahy Scale: Fair     Standing balance support: No upper extremity supported, During functional activity Standing balance-Leahy Scale: Poor Standing balance comment: Requires Min A to prevent falls multiple times                            Communication Communication Communication: No apparent difficulties  Cognition Arousal: Alert Behavior During Therapy: WFL for tasks assessed/performed   PT - Cognitive impairments: No apparent impairments                         Following commands: Intact      Cueing Cueing Techniques: Verbal cues  Exercises      General Comments General comments (skin integrity, edema, etc.): VSS. HR in the 120s      Pertinent Vitals/Pain Pain Assessment Pain Assessment: No/denies pain    Home Living  Prior Function            PT Goals (current goals can now be found in the care plan section) Progress towards PT goals: Progressing toward goals    Frequency    Min 3X/week      PT Plan      Co-evaluation              AM-PAC PT 6 Clicks Mobility   Outcome Measure  Help needed turning from your back to your side while in a flat bed without using bedrails?: A Little Help needed moving from lying on your back to sitting on the side of a flat bed without using bedrails?: A Little Help needed moving to and from a  bed to a chair (including a wheelchair)?: A Little Help needed standing up from a chair using your arms (e.g., wheelchair or bedside chair)?: A Little Help needed to walk in hospital room?: A Little Help needed climbing 3-5 steps with a railing? : A Little 6 Click Score: 18    End of Session Equipment Utilized During Treatment: Gait belt Activity Tolerance: Patient tolerated treatment well Patient left: in bed;with call bell/phone within reach Nurse Communication: Mobility status PT Visit Diagnosis: Other abnormalities of gait and mobility (R26.89);Unsteadiness on feet (R26.81);Ataxic gait (R26.0)     Time: 8558-8547 PT Time Calculation (min) (ACUTE ONLY): 11 min  Charges:    $Gait Training: 8-22 mins PT General Charges $$ ACUTE PT VISIT: 1 Visit                     Sueellen NOVAK, PT, DPT Acute Rehab Services [phone]    Derek Cruz 12/21/2023, 3:16 PM

## 2023-12-21 NOTE — Plan of Care (Signed)
  Problem: Education: Goal: Knowledge of General Education information will improve Description: Including pain rating scale, medication(s)/side effects and non-pharmacologic comfort measures Outcome: Progressing   Problem: Health Behavior/Discharge Planning: Goal: Ability to manage health-related needs will improve Outcome: Progressing   Problem: Clinical Measurements: Goal: Ability to maintain clinical measurements within normal limits will improve Outcome: Progressing Goal: Will remain free from infection Outcome: Progressing Goal: Diagnostic test results will improve Outcome: Progressing Goal: Respiratory complications will improve Outcome: Progressing Goal: Cardiovascular complication will be avoided Outcome: Progressing   Problem: Activity: Goal: Risk for activity intolerance will decrease Outcome: Progressing   Problem: Nutrition: Goal: Adequate nutrition will be maintained Outcome: Progressing   Problem: Coping: Goal: Level of anxiety will decrease Outcome: Progressing   Problem: Elimination: Goal: Will not experience complications related to bowel motility Outcome: Progressing Goal: Will not experience complications related to urinary retention Outcome: Progressing   Problem: Pain Managment: Goal: General experience of comfort will improve and/or be controlled Outcome: Progressing   Problem: Safety: Goal: Ability to remain free from injury will improve Outcome: Progressing   Problem: Skin Integrity: Goal: Risk for impaired skin integrity will decrease Outcome: Progressing   Problem: Education: Goal: Ability to describe self-care measures that may prevent or decrease complications (Diabetes Survival Skills Education) will improve Outcome: Progressing Goal: Individualized Educational Video(s) Outcome: Progressing   Problem: Coping: Goal: Ability to adjust to condition or change in health will improve Outcome: Progressing   Problem: Fluid  Volume: Goal: Ability to maintain a balanced intake and output will improve Outcome: Progressing   Problem: Health Behavior/Discharge Planning: Goal: Ability to identify and utilize available resources and services will improve Outcome: Progressing Goal: Ability to manage health-related needs will improve Outcome: Progressing   Problem: Metabolic: Goal: Ability to maintain appropriate glucose levels will improve Outcome: Progressing   Problem: Nutritional: Goal: Maintenance of adequate nutrition will improve Outcome: Progressing Goal: Progress toward achieving an optimal weight will improve Outcome: Progressing   Problem: Skin Integrity: Goal: Risk for impaired skin integrity will decrease Outcome: Progressing   Problem: Tissue Perfusion: Goal: Adequacy of tissue perfusion will improve Outcome: Progressing   Problem: Education: Goal: Knowledge of disease or condition will improve Outcome: Progressing Goal: Understanding of discharge needs will improve Outcome: Progressing   Problem: Health Behavior/Discharge Planning: Goal: Ability to identify changes in lifestyle to reduce recurrence of condition will improve Outcome: Progressing Goal: Identification of resources available to assist in meeting health care needs will improve Outcome: Progressing   Problem: Physical Regulation: Goal: Complications related to the disease process, condition or treatment will be avoided or minimized Outcome: Progressing   Problem: Safety: Goal: Ability to remain free from injury will improve Outcome: Progressing

## 2023-12-21 NOTE — Plan of Care (Signed)

## 2023-12-21 NOTE — Evaluation (Signed)
 Clinical/Bedside Swallow Evaluation Patient Details  Name: Derek Cruz MRN: [id number] Date of Birth: [birth year]  Today's Date: 12/21/2023 Time: SLP Start Time (ACUTE ONLY): 1124 SLP Stop Time (ACUTE ONLY): 1139 SLP Time Calculation (min) (ACUTE ONLY): 15 min  Past Medical History:  Past Medical History:  Diagnosis Date   Alcohol  abuse    Chest pain    Dyslipidemia    Elevated liver enzymes    Gastritis    Hypertension    Past Surgical History: History reviewed. No pertinent surgical history. HPI:  Pt is 58 yo presenting [facility] on 7/[facility] due to 3 days of N/V/D associated with intermittent abdominal pain and dyspnea. PMH: ETOH abuse, HTN, fatty liver, CKD. Barium esophagram swallow 8/1 Limited study. Moderate gastroesophageal reflux. Otherwise unremarkable esophagram. CXR Negative portable chest. MD ordered ST.    Assessment / Plan / Recommendation  Clinical Impression  Pt was able to adequately demonstrate that he comprehended and could express himself in [place] without use of interpreter pad. Pt is presenting with symptoms associated with esophageal involvement. Yesterday's barium esophagram revealed moderate  gastroesophageal reflux. Otherwise  unremarkable esophagram. He reported yesterday significant pharyngeal globus sensation and very difficult to swallow. Prior to po's he expectorated mucous. Dentition and oromotor ability intact. Pt consumed close to 3 oz continous with straw without s/s aspiration but facial grimace and affirmed globus sensation which mostly resolved after 10 seconds. Masticated graham cracker without difficulty or reports of globus. He says his swallow is much better today. No concern with oropharyngeal dysphagia during assessment. Given barium esophageal results and history of ETOH abuse suspect symptoms are related to esophageal findings. Continue regular diet, thin liquids, pills with thin. SLP spent time educating pt on esophageal strategies to  mitigate symptoms and answered pt's questions. No further ST is warranted. SLP Visit Diagnosis: Dysphagia, unspecified (R13.10)    Aspiration Risk  Mild aspiration risk    Diet Recommendation Regular;Thin liquid    Liquid Administration via: Cup;Straw Medication Administration: Whole meds with liquid Supervision: Patient able to self feed Compensations: Slow rate;Small sips/bites;Follow solids with liquid Postural Changes: Remain upright for at least 30 minutes after po intake;Seated upright at 90 degrees    Other  Recommendations Oral Care Recommendations: Oral care BID     Assistance Recommended at Discharge    Functional Status Assessment Patient has not had a recent decline in their functional status  Frequency and Duration            Prognosis        Swallow Study   General Date of Onset: 12/21/23 HPI: Pt is 58 yo presenting [facility] on 7/30 due to 3 days of N/V/D associated with intermittent abdominal pain and dyspnea. PMH: ETOH abuse, HTN, fatty liver, CKD. Barium esophagram swallow 8/1 Limited study. Moderate gastroesophageal reflux. Otherwise unremarkable esophagram. CXR Negative portable chest. MD ordered ST. Type of Study: Bedside Swallow Evaluation Previous Swallow Assessment:  (none) Diet Prior to this Study: Regular;Thin liquids (Level 0) Temperature Spikes Noted: No Respiratory Status: Room air History of Recent Intubation: No Behavior/Cognition: Alert;Cooperative;Pleasant mood Oral Cavity Assessment: Within Functional Limits Oral Care Completed by SLP: No Oral Cavity - Dentition: Adequate natural dentition Vision: Functional for self-feeding Self-Feeding Abilities: Able to feed self Patient Positioning:  (edge of bed) Baseline Vocal Quality: Normal Volitional Cough: Strong Volitional Swallow: Able to elicit    Oral/Motor/Sensory Function Overall Oral Motor/Sensory Function: Within functional limits   Ice Chips Ice chips: Not tested   Thin Liquid  Thin  Liquid: Within functional limits Presentation: Straw Other Comments:  (facial grimace and globus sensation after continuous straw sips)    Nectar Thick Nectar Thick Liquid: Not tested   Honey Thick Honey Thick Liquid: Not tested   Puree Puree: Not tested   Solid     Solid: Within functional limits      Dustin Olam Bull 12/21/2023,12:30 PM

## 2023-12-22 LAB — CBC
HCT: 31 % — ABNORMAL LOW (ref 39.0–52.0)
Hemoglobin: 10.8 g/dL — ABNORMAL LOW (ref 13.0–17.0)
MCH: 30.9 pg (ref 26.0–34.0)
MCHC: 34.8 g/dL (ref 30.0–36.0)
MCV: 88.8 fL (ref 80.0–100.0)
Platelets: 136 K/uL — ABNORMAL LOW (ref 150–400)
RBC: 3.49 MIL/uL — ABNORMAL LOW (ref 4.22–5.81)
RDW: 15.5 % (ref 11.5–15.5)
WBC: 6.5 K/uL (ref 4.0–10.5)
nRBC: 1.8 % — ABNORMAL HIGH (ref 0.0–0.2)

## 2023-12-22 LAB — COMPREHENSIVE METABOLIC PANEL WITH GFR
ALT: 71 U/L — ABNORMAL HIGH (ref 0–44)
AST: 120 U/L — ABNORMAL HIGH (ref 15–41)
Albumin: 2.1 g/dL — ABNORMAL LOW (ref 3.5–5.0)
Alkaline Phosphatase: 347 U/L — ABNORMAL HIGH (ref 38–126)
Anion gap: 8 (ref 5–15)
BUN: 5 mg/dL — ABNORMAL LOW (ref 6–20)
CO2: 24 mmol/L (ref 22–32)
Calcium: 8.1 mg/dL — ABNORMAL LOW (ref 8.9–10.3)
Chloride: 100 mmol/L (ref 98–111)
Creatinine, Ser: 0.65 mg/dL (ref 0.61–1.24)
GFR, Estimated: >60 mL/min (ref >60)
Glucose, Bld: 170 mg/dL — ABNORMAL HIGH (ref 70–99)
Potassium: 3.7 mmol/L (ref 3.5–5.1)
Sodium: 132 mmol/L — ABNORMAL LOW (ref 135–145)
Total Bilirubin: 4.1 mg/dL — ABNORMAL HIGH (ref 0.0–1.2)
Total Protein: 5.4 g/dL — ABNORMAL LOW (ref 6.5–8.1)

## 2023-12-22 LAB — TRIGLYCERIDES: Triglycerides: 555 mg/dL — ABNORMAL HIGH (ref <150)

## 2023-12-22 LAB — GLUCOSE, CAPILLARY
Glucose-Capillary: 131 mg/dL — ABNORMAL HIGH (ref 70–99)
Glucose-Capillary: 193 mg/dL — ABNORMAL HIGH (ref 70–99)
Glucose-Capillary: 185 mg/dL — ABNORMAL HIGH (ref 70–99)
Glucose-Capillary: 229 mg/dL — ABNORMAL HIGH (ref 70–99)

## 2023-12-22 LAB — MAGNESIUM: Magnesium: 2 mg/dL (ref 1.7–2.4)

## 2023-12-22 LAB — PHOSPHORUS: Phosphorus: 3.1 mg/dL (ref 2.5–4.6)

## 2023-12-22 MED ORDER — SIMETHICONE 40 MG/0.6ML PO SUSP
80.0000 mg | Freq: Four times a day (QID) | ORAL | Status: DC | PRN
  Administered 2023-12-22 – 2023-12-23 (×3): 80 mg via ORAL
  Filled 2023-12-22 (×4): qty 1.2

## 2023-12-22 NOTE — Plan of Care (Signed)
  Problem: Education: Goal: Knowledge of General Education information will improve Description: Including pain rating scale, medication(s)/side effects and non-pharmacologic comfort measures Outcome: Adequate for Discharge   Problem: Health Behavior/Discharge Planning: Goal: Ability to manage health-related needs will improve Outcome: Adequate for Discharge   Problem: Clinical Measurements: Goal: Ability to maintain clinical measurements within normal limits will improve Outcome: Adequate for Discharge Goal: Will remain free from infection Outcome: Adequate for Discharge Goal: Diagnostic test results will improve Outcome: Adequate for Discharge Goal: Respiratory complications will improve Outcome: Adequate for Discharge Goal: Cardiovascular complication will be avoided Outcome: Adequate for Discharge   Problem: Activity: Goal: Risk for activity intolerance will decrease Outcome: Adequate for Discharge   Problem: Nutrition: Goal: Adequate nutrition will be maintained Outcome: Adequate for Discharge   Problem: Coping: Goal: Level of anxiety will decrease Outcome: Adequate for Discharge   Problem: Elimination: Goal: Will not experience complications related to bowel motility Outcome: Adequate for Discharge Goal: Will not experience complications related to urinary retention Outcome: Adequate for Discharge   Problem: Pain Managment: Goal: General experience of comfort will improve and/or be controlled Outcome: Adequate for Discharge   Problem: Safety: Goal: Ability to remain free from injury will improve Outcome: Adequate for Discharge   Problem: Skin Integrity: Goal: Risk for impaired skin integrity will decrease Outcome: Adequate for Discharge   Problem: Education: Goal: Ability to describe self-care measures that may prevent or decrease complications (Diabetes Survival Skills Education) will improve Outcome: Adequate for Discharge Goal: Individualized Educational  Video(s) Outcome: Adequate for Discharge   Problem: Coping: Goal: Ability to adjust to condition or change in health will improve Outcome: Adequate for Discharge   Problem: Fluid Volume: Goal: Ability to maintain a balanced intake and output will improve Outcome: Adequate for Discharge   Problem: Health Behavior/Discharge Planning: Goal: Ability to identify and utilize available resources and services will improve Outcome: Adequate for Discharge Goal: Ability to manage health-related needs will improve Outcome: Adequate for Discharge   Problem: Metabolic: Goal: Ability to maintain appropriate glucose levels will improve Outcome: Adequate for Discharge   Problem: Nutritional: Goal: Maintenance of adequate nutrition will improve Outcome: Adequate for Discharge Goal: Progress toward achieving an optimal weight will improve Outcome: Adequate for Discharge   Problem: Skin Integrity: Goal: Risk for impaired skin integrity will decrease Outcome: Adequate for Discharge   Problem: Tissue Perfusion: Goal: Adequacy of tissue perfusion will improve Outcome: Adequate for Discharge   Problem: Education: Goal: Knowledge of disease or condition will improve Outcome: Adequate for Discharge Goal: Understanding of discharge needs will improve Outcome: Adequate for Discharge   Problem: Health Behavior/Discharge Planning: Goal: Ability to identify changes in lifestyle to reduce recurrence of condition will improve Outcome: Adequate for Discharge Goal: Identification of resources available to assist in meeting health care needs will improve Outcome: Adequate for Discharge   Problem: Physical Regulation: Goal: Complications related to the disease process, condition or treatment will be avoided or minimized Outcome: Adequate for Discharge   Problem: Safety: Goal: Ability to remain free from injury will improve Outcome: Adequate for Discharge

## 2023-12-22 NOTE — Progress Notes (Signed)
 PROGRESS NOTE                                                                                                                                                                                                             Patient Demographics:    Derek Cruz, is a 58 y.o. male, DOB - 1965/08/09, FMW:[id number]  Outpatient Primary MD for the patient is Lorren Greig PARAS, NP    LOS - 4  Admit date - 12/17/2023    Chief Complaint  Patient presents with   Withdrawal       Brief Narrative (HPI from H&P)   58 y.o. male who has a PMH including but not limited to alcohol  abuse, hypertension, fatty liver, DM type II, CKD 2. He presented to [facility] ED on 12/17/2023 with N/V/D for 3 days associated with intermittent abdominal pain and dyspnea.  He was diagnosed with acute pancreatitis in the setting of alcohol  abuse and elevated triglycerides and admitted to ICU   Subjective:   Patient in bed, appears comfortable, denies any headache, no fever, no chest pain or pressure, no shortness of breath , no abdominal pain. No new focal weakness.   Assessment  & Plan :   Ongoing alcohol  abuse, alcohol  intoxication, alcoholic pancreatitis, mild to moderate elevation of triglycerides.  Was in DTs, DTs have improved, was given bowel rest and IV fluids along with IV insulin  drip for moderately elevated triglycerides, right upper quadrant ultrasound is stable, he does appear to have underlying liver injury from ongoing alcohol  abuse, CBD on the ultrasound is stable, LFTs are trending down.  Abdominal pain has improved  Overall clinically better continue IV fluids, discontinue insulin  drip, low carbohydrate diet, placed on Crestor  and Tricor  for triglycerides, monitor abdominal pain and LFTs.  Strictly counseled to quit from alcohol .  CIWA protocol and Librium  will be continued.  Postdischarge outpatient PCP and GI  follow-up.  Dyslipidemia with moderately elevated triglycerides.  Crestor  and Tricor , low carbohydrate heart healthy diet.  Abstain from alcohol .  Hypokalemia, hypomagnesemia, hypophosphatemia.  Replaced.  Dehydration with hyponatremia.  Hydrate with IV fluids.  Dysphagia.  Stable barium swallow.  Speech to follow.  Acute metabolic encephalopathy due to alcohol  intoxication and withdrawal.  Stable, monitor with supportive care.      Condition - Extremely Guarded  Family Communication  : None present  Code Status : Full code  Consults  : PCCM  PUD Prophylaxis : PPI   Procedures  :     US  - 1. No evidence of cholelithiasis or acute cholecystitis. 2. Diffuse fatty infiltration of the liver.       Disposition Plan  :    Status is: Inpatient  DVT Prophylaxis  :    heparin  injection 5,000 Units Start: 12/18/23 1400 SCDs Start: 12/18/23 0309    Lab Results  Component Value Date   PLT 136 (L) 12/22/2023    Diet :  Diet Order             Diet heart healthy/carb modified Fluid consistency: Thin; Fluid restriction: 1500 mL Fluid  Diet effective now                    Inpatient Medications  Scheduled Meds:  chlordiazePOXIDE   15 mg Oral TID   Chlorhexidine  Gluconate Cloth  6 each Topical Daily   feeding supplement  237 mL Oral BID BM   fenofibrate   160 mg Oral Daily   folic acid   1 mg Oral Daily   heparin   5,000 Units Subcutaneous Q8H   insulin  aspart  0-5 Units Subcutaneous QHS   insulin  aspart  0-9 Units Subcutaneous TID WC   LORazepam   0-4 mg Oral Q8H   multivitamin with minerals  1 tablet Oral Daily   pantoprazole   40 mg Oral QHS   potassium & sodium phosphates   1 packet Oral TID WC & HS   rosuvastatin   20 mg Oral Daily   thiamine   100 mg Oral Daily   Or   thiamine   100 mg Intravenous Daily   Continuous Infusions:  lactated ringers  100 mL/hr at 12/22/23 0800   PRN Meds:.acetaminophen , docusate sodium , loperamide , LORazepam , mouth rinse, phenol,  polyethylene glycol  Antibiotics  :    Anti-infectives (From admission, onward)    None         Objective:   Vitals:   12/22/23 0000 12/22/23 0400 12/22/23 0506 12/22/23 0814  BP: 118/72 115/71  125/78  Pulse:  100 100 92  Resp:  17 19 19   Temp:  97.9 F (36.6 C)  99 F (37.2 C)  TempSrc:  Oral  Oral  SpO2:  92% 93% 94%  Weight:   75.4 kg   Height:        Wt Readings from Last 3 Encounters:  12/22/23 75.4 kg  08/03/23 77 kg  07/29/23 76.2 kg     Intake/Output Summary (Last 24 hours) at 12/22/2023 0914 Last data filed at 12/22/2023 0800 Gross per 24 hour  Intake 2276.64 ml  Output 2550 ml  Net -273.36 ml     Physical Exam  Awake Alert, No new F.N deficits, Normal affect [place].AT,PERRAL Supple Neck, No JVD,   Symmetrical Chest wall movement, Good air movement bilaterally, CTAB RRR,No Gallops,Rubs or new Murmurs,  +ve B.Sounds, Abd Soft, No tenderness,   No Cyanosis, Clubbing or edema      Data Review:    Recent Labs  Lab 12/17/23 1308 12/18/23 0102 12/18/23 0456 12/18/23 1034 12/19/23 0415 12/19/23 0425 12/20/23 0420 12/21/23 0801 12/22/23 0616  WBC 3.0*  --  4.7  --  4.4  --  5.3 6.3 6.5  HGB 12.1*   < > 13.1   < > 13.2 13.6 11.9* 10.8* 10.8*  HCT 34.3*   < > 36.5*   < > 35.7* 40.0 33.7* 30.7* 31.0*  PLT 102*  --  157  --  78*  --  82* 114* 136*  MCV 86.0  --  86.1  --  86.2  --  87.8 87.7 88.8  MCH 30.3  --  30.9  --  31.9  --  31.0 30.9 30.9  MCHC 35.3  --  35.9  --  37.0*  --  35.3 35.2 34.8  RDW 14.5  --  14.2  --  13.4  --  14.1 14.8 15.5  LYMPHSABS 0.5*  --   --   --   --   --   --   --   --   MONOABS 0.2  --   --   --   --   --   --   --   --   EOSABS 0.0  --   --   --   --   --   --   --   --   BASOSABS 0.0  --   --   --   --   --   --   --   --    < > = values in this interval not displayed.    Recent Labs  Lab 12/17/23 2215 12/18/23 0102 12/18/23 0456 12/18/23 0457 12/18/23 0458 12/19/23 0415 12/19/23 0425 12/19/23 0740  12/19/23 1526 12/19/23 1840 12/19/23 2345 12/20/23 0420 12/21/23 0801 12/22/23 0616  NA 107*   < >  --  117*   < > 127* 128*   < > 128* 129* 127* 128* 131* 132*  K RESULTS UNAVAILABLE DUE TO INTERFERING SUBSTANCE   < >  --  RESULTS UNAVAILABLE DUE TO INTERFERING SUBSTANCE   < > 2.9* 2.8*  --  4.0  --   --  3.2* 3.3* 3.7  CL 72*   < >  --  83*  --  92*  --   --  97*  --   --  97* 98 100  CO2 19*  --   --  20*  --  23  --   --  18*  --   --  21* 22 24  ANIONGAP 16*  --   --  14  --  12  --   --  13  --   --  10 11 8   GLUCOSE 193*   < >  --  193*  --  279*  --   --  195*  --   --  230* 134* 170*  BUN 10   < >  --  7  --  <5*  --   --  <5*  --   --  <5* <5* 5*  CREATININE <0.30*   < >  --  0.37*  --  0.66  --   --  0.76  --   --  0.71 0.62 0.65  AST RESULTS UNAVAILABLE DUE TO INTERFERING SUBSTANCE  --   --  RESULTS UNAVAILABLE DUE TO INTERFERING SUBSTANCE  --   --   --   --   --   --   --  163* 118* 120*  ALT RESULTS UNAVAILABLE DUE TO INTERFERING SUBSTANCE  --   --  RESULTS UNAVAILABLE DUE TO INTERFERING SUBSTANCE  --   --   --   --   --   --   --  105* 80* 71*  ALKPHOS RESULTS UNAVAILABLE DUE TO INTERFERING SUBSTANCE  --   --  RESULTS UNAVAILABLE DUE TO INTERFERING SUBSTANCE  --   --   --   --   --   --   --  302* 286* 347*  BILITOT 11.6*  --   --  RESULTS UNAVAILABLE DUE TO INTERFERING SUBSTANCE  --   --   --   --   --   --   --  5.6* 4.6* 4.1*  ALBUMIN 3.1*  --   --  2.5*  --   --   --   --   --   --   --  2.0* 2.1* 2.1*  INR  --   --   --   --   --   --   --   --   --   --   --   --  1.0  --   TSH  --   --   --  1.640  --   --   --   --   --   --   --   --   --   --   HGBA1C  --   --   --  9.0*  --   --   --   --   --   --   --   --   --   --   MG  --   --  3.0*  --   --  RESULTS UNAVAILABLE DUE TO INTERFERING SUBSTANCE  --   --   --   --   --  1.7 1.9 2.0  PHOS  --    < > RESULTS UNAVAILABLE DUE TO INTERFERING SUBSTANCE  --   --  1.1*  --   --  2.1*  --   --  2.1* 2.8 3.1  CALCIUM  7.4*  --    --  7.6*  --  8.1*  --   --  8.4*  --   --  8.1* 7.7* 8.1*   < > = values in this interval not displayed.      Recent Labs  Lab 12/18/23 0456 12/18/23 0457 12/19/23 0415 12/19/23 1526 12/20/23 0420 12/21/23 0801 12/22/23 0616  INR  --   --   --   --   --  1.0  --   TSH  --  1.640  --   --   --   --   --   HGBA1C  --  9.0*  --   --   --   --   --   MG 3.0*  --  RESULTS UNAVAILABLE DUE TO INTERFERING SUBSTANCE  --  1.7 1.9 2.0  CALCIUM   --  7.6* 8.1* 8.4* 8.1* 7.7* 8.1*    --------------------------------------------------------------------------------------------------------------- Lab Results  Component Value Date   CHOL 587 (H) 12/19/2023   HDL NOT REPORTED DUE TO HIGH TRIGLYCERIDES 12/19/2023   LDLCALC UNABLE TO CALCULATE IF TRIGLYCERIDE OVER 400 mg/dL 92/68/7974   LDLDIRECT UNABLE TO CALCULATE IF TRIGLYCERIDE IS >1293 mg/dL 92/68/7974   TRIG 444 (H) 12/22/2023   CHOLHDL NOT REPORTED DUE TO HIGH TRIGLYCERIDES 12/19/2023    Lab Results  Component Value Date   HGBA1C 9.0 (H) 12/18/2023      Micro Results Recent Results (from the past 240 hours)  MRSA Next Gen by PCR, Nasal     Status: None   Collection Time: 12/18/23  3:09 AM   Specimen: Nasal Mucosa; Nasal Swab  Result Value Ref Range Status   MRSA by PCR Next Gen NOT DETECTED NOT DETECTED Final    Comment: (NOTE) The GeneXpert MRSA Assay (FDA approved for NASAL specimens only), is one component of a comprehensive MRSA colonization  surveillance program. It is not intended to diagnose MRSA infection nor to guide or monitor treatment for MRSA infections. Test performance is not FDA approved in patients less than 54 years old. Performed at [facility], [address] [place], [place] [address]     Radiology Report DG Chest [place] 1 View Result Date: 12/21/2023 CLINICAL DATA:  58 year old male with shortness of breath. Status post esophagram yesterday. EXAM: PORTABLE CHEST 1 VIEW COMPARISON:  Chest  radiograph 12/17/2023 and earlier. FINDINGS: Portable AP semi upright view at 0709 hours. Mildly lower lung volumes. Normal cardiac size and mediastinal contours. Visualized tracheal air column is within normal limits. Allowing for portable technique the lungs are clear. No pneumothorax or pleural effusion. Barium type contrast at the splenic flexure, partially visible and otherwise paucity of bowel gas in the upper abdomen. No acute osseous abnormality identified. IMPRESSION: Negative portable chest. Electronically Signed   By: VEAR Hurst M.D.   On: 12/21/2023 07:57   DG ESOPHAGUS W SINGLE CM (SOL OR THIN BA) Result Date: 12/20/2023 CLINICAL DATA:  History of alcohol  abuse. In the hospital for alcohol  withdrawal, encephalopathy, electrolyte abnormalities. Has been having nausea, vomiting, diarrhea, dysphagia. IR consulted for esophagram given difficulty swallowing. EXAM: ESOPHAGUS/BARIUM SWALLOW/TABLET STUDY TECHNIQUE: Single contrast examination was performed using thin liquid barium. Exam significantly limited by patient immobility. This exam was performed by Kimble Clas, PA-C, and was supervised and interpreted by Dr. LOISE Molt. FLUOROSCOPY: Radiation Exposure Index (as provided by the fluoroscopic device): 17.00 mGy Kerma COMPARISON:  None Available. FINDINGS: Swallowing: Not directly assessed due to limited study. Pharynx: Not directly assessed due to limited study. Esophagus: Normal appearance. Esophageal motility: Within normal limits. Hiatal Hernia: None. Gastroesophageal reflux: Moderate amount of reflux to the mid esophagus Ingested 13 mm barium tablet: Passed normally Other: None. IMPRESSION: Limited study. Moderate gastroesophageal reflux. Otherwise unremarkable esophagram. Electronically Signed   By: Ree Molt M.D.   On: 12/20/2023 14:35     Signature  -   Lavada Stank M.D on 12/22/2023 at 9:14 AM   -  To page go to [email or web address]

## 2023-12-22 NOTE — Plan of Care (Signed)
  Problem: Education: Goal: Knowledge of General Education information will improve Description: Including pain rating scale, medication(s)/side effects and non-pharmacologic comfort measures Outcome: Progressing   Problem: Health Behavior/Discharge Planning: Goal: Ability to manage health-related needs will improve Outcome: Progressing   Problem: Clinical Measurements: Goal: Ability to maintain clinical measurements within normal limits will improve Outcome: Progressing Goal: Will remain free from infection Outcome: Progressing Goal: Diagnostic test results will improve Outcome: Progressing Goal: Respiratory complications will improve Outcome: Progressing Goal: Cardiovascular complication will be avoided Outcome: Progressing   Problem: Activity: Goal: Risk for activity intolerance will decrease Outcome: Progressing   Problem: Nutrition: Goal: Adequate nutrition will be maintained Outcome: Progressing   Problem: Coping: Goal: Level of anxiety will decrease Outcome: Progressing   Problem: Elimination: Goal: Will not experience complications related to bowel motility Outcome: Progressing Goal: Will not experience complications related to urinary retention Outcome: Progressing   Problem: Pain Managment: Goal: General experience of comfort will improve and/or be controlled Outcome: Progressing   Problem: Safety: Goal: Ability to remain free from injury will improve Outcome: Progressing   Problem: Skin Integrity: Goal: Risk for impaired skin integrity will decrease Outcome: Progressing   Problem: Education: Goal: Ability to describe self-care measures that may prevent or decrease complications (Diabetes Survival Skills Education) will improve Outcome: Progressing Goal: Individualized Educational Video(s) Outcome: Progressing   Problem: Coping: Goal: Ability to adjust to condition or change in health will improve Outcome: Progressing   Problem: Fluid  Volume: Goal: Ability to maintain a balanced intake and output will improve Outcome: Progressing   Problem: Health Behavior/Discharge Planning: Goal: Ability to identify and utilize available resources and services will improve Outcome: Progressing Goal: Ability to manage health-related needs will improve Outcome: Progressing   Problem: Metabolic: Goal: Ability to maintain appropriate glucose levels will improve Outcome: Progressing   Problem: Nutritional: Goal: Maintenance of adequate nutrition will improve Outcome: Progressing Goal: Progress toward achieving an optimal weight will improve Outcome: Progressing   Problem: Skin Integrity: Goal: Risk for impaired skin integrity will decrease Outcome: Progressing   Problem: Tissue Perfusion: Goal: Adequacy of tissue perfusion will improve Outcome: Progressing   Problem: Education: Goal: Knowledge of disease or condition will improve Outcome: Progressing Goal: Understanding of discharge needs will improve Outcome: Progressing   Problem: Health Behavior/Discharge Planning: Goal: Ability to identify changes in lifestyle to reduce recurrence of condition will improve Outcome: Progressing Goal: Identification of resources available to assist in meeting health care needs will improve Outcome: Progressing   Problem: Physical Regulation: Goal: Complications related to the disease process, condition or treatment will be avoided or minimized Outcome: Progressing   Problem: Safety: Goal: Ability to remain free from injury will improve Outcome: Progressing

## 2023-12-23 LAB — HEMOGLOBIN A1C
Hgb A1c MFr Bld: 9.3 % — ABNORMAL HIGH (ref 4.8–5.6)
Mean Plasma Glucose: 220 mg/dL

## 2023-12-23 LAB — CBC WITH DIFFERENTIAL/PLATELET
Abs Immature Granulocytes: 0.3 K/uL — ABNORMAL HIGH (ref 0.00–0.07)
Basophils Absolute: 0.1 K/uL (ref 0.0–0.1)
Basophils Relative: 1 %
Eosinophils Absolute: 0 K/uL (ref 0.0–0.5)
Eosinophils Relative: 1 %
HCT: 35.2 % — ABNORMAL LOW (ref 39.0–52.0)
Hemoglobin: 12.2 g/dL — ABNORMAL LOW (ref 13.0–17.0)
Immature Granulocytes: 5 %
Lymphocytes Relative: 31 %
Lymphs Abs: 2 K/uL (ref 0.7–4.0)
MCH: 30.8 pg (ref 26.0–34.0)
MCHC: 34.7 g/dL (ref 30.0–36.0)
MCV: 88.9 fL (ref 80.0–100.0)
Monocytes Absolute: 1.1 K/uL — ABNORMAL HIGH (ref 0.1–1.0)
Monocytes Relative: 16 %
Neutro Abs: 3 K/uL (ref 1.7–7.7)
Neutrophils Relative %: 46 %
Platelets: 179 K/uL (ref 150–400)
RBC: 3.96 MIL/uL — ABNORMAL LOW (ref 4.22–5.81)
RDW: 15.9 % — ABNORMAL HIGH (ref 11.5–15.5)
WBC: 6.5 K/uL (ref 4.0–10.5)
nRBC: 2.9 % — ABNORMAL HIGH (ref 0.0–0.2)

## 2023-12-23 LAB — GLUCOSE, CAPILLARY
Glucose-Capillary: 121 mg/dL — ABNORMAL HIGH (ref 70–99)
Glucose-Capillary: 216 mg/dL — ABNORMAL HIGH (ref 70–99)
Glucose-Capillary: 253 mg/dL — ABNORMAL HIGH (ref 70–99)
Glucose-Capillary: 219 mg/dL — ABNORMAL HIGH (ref 70–99)

## 2023-12-23 LAB — COMPREHENSIVE METABOLIC PANEL WITH GFR
ALT: 77 U/L — ABNORMAL HIGH (ref 0–44)
AST: 157 U/L — ABNORMAL HIGH (ref 15–41)
Albumin: 2.4 g/dL — ABNORMAL LOW (ref 3.5–5.0)
Alkaline Phosphatase: 384 U/L — ABNORMAL HIGH (ref 38–126)
Anion gap: 11 (ref 5–15)
BUN: 6 mg/dL (ref 6–20)
CO2: 23 mmol/L (ref 22–32)
Calcium: 9.1 mg/dL (ref 8.9–10.3)
Chloride: 96 mmol/L — ABNORMAL LOW (ref 98–111)
Creatinine, Ser: 0.78 mg/dL (ref 0.61–1.24)
GFR, Estimated: >60 mL/min (ref >60)
Glucose, Bld: 114 mg/dL — ABNORMAL HIGH (ref 70–99)
Potassium: 3.6 mmol/L (ref 3.5–5.1)
Sodium: 130 mmol/L — ABNORMAL LOW (ref 135–145)
Total Bilirubin: 4.4 mg/dL — ABNORMAL HIGH (ref 0.0–1.2)
Total Protein: 6.5 g/dL (ref 6.5–8.1)

## 2023-12-23 LAB — MAGNESIUM: Magnesium: 2.2 mg/dL (ref 1.7–2.4)

## 2023-12-23 LAB — TRIGLYCERIDES: Triglycerides: 506 mg/dL — ABNORMAL HIGH (ref <150)

## 2023-12-23 NOTE — Progress Notes (Signed)
 Inpatient Rehab Admissions Coordinator:   Picking up for my partner.  Note updated recommendations for [facility] and pt mobilizing well without DME up to 600'  He does not need CIR at this time. We will sign off.   Reche Lowers, PT, DPT Admissions Coordinator [phone] 12/23/23  2:08 PM

## 2023-12-23 NOTE — Progress Notes (Signed)
 Inpatient Rehab Admissions Coordinator:    CIR following. Will see how he does with therapies today and make an assessment of candidacy.  Leita Kleine, MS, CCC-SLP Rehab Admissions Coordinator  [phone] (celll) [phone] (office)

## 2023-12-23 NOTE — Progress Notes (Signed)
 PROGRESS NOTE                                                                                                                                                                                                             Patient Demographics:    Derek Cruz, is a 58 y.o. male, DOB - 1966/03/10, FMW:[id number]  Outpatient Primary MD for the patient is Lorren Greig PARAS, NP    LOS - 5  Admit date - 12/17/2023    Chief Complaint  Patient presents with   Withdrawal       Brief Narrative (HPI from H&P)   58 y.o. male who has a PMH including but not limited to alcohol  abuse, hypertension, fatty liver, DM type II, CKD 2. He presented to [facility] ED on 12/17/2023 with N/V/D for 3 days associated with intermittent abdominal pain and dyspnea.  He was diagnosed with acute pancreatitis in the setting of alcohol  abuse and elevated triglycerides and admitted to ICU   Subjective:   Patient in bed in no distress denies any headache or chest pain, having some gas related abdominal discomfort, denies any nausea vomiting, no focal deficits.   Assessment  & Plan :   Ongoing alcohol  abuse, alcohol  intoxication, alcoholic pancreatitis, mild to moderate elevation of triglycerides.  Was in DTs, DTs have improved, was given bowel rest and IV fluids along with IV insulin  drip for moderately elevated triglycerides, right upper quadrant ultrasound is stable, he does appear to have underlying liver injury from ongoing alcohol  abuse, CBD on the ultrasound is stable, LFTs are trending down.  Abdominal pain has improved  Overall clinically better continue IV fluids, discontinue insulin  drip, low carbohydrate diet, placed on Crestor  and Tricor  for triglycerides, monitor abdominal pain and LFTs.  Strictly counseled to quit from alcohol .  CIWA protocol and Librium  will be continued.  Postdischarge outpatient PCP and GI  follow-up.  Dyslipidemia with moderately elevated triglycerides.  Crestor  and Tricor , low carbohydrate heart healthy diet.  Abstain from alcohol .  Hypokalemia, hypomagnesemia, hypophosphatemia.  Replaced.  Dehydration with hyponatremia.  Hydrate with IV fluids.  Dysphagia.  Stable barium swallow.  Speech to follow.  Acute metabolic encephalopathy due to alcohol  intoxication and withdrawal.  Stable, monitor with supportive care.      Condition - Extremely Guarded  Family Communication  : None present  Code  Status : Full code  Consults  : PCCM  PUD Prophylaxis : PPI   Procedures  :     US  - 1. No evidence of cholelithiasis or acute cholecystitis. 2. Diffuse fatty infiltration of the liver.       Disposition Plan  :    Status is: Inpatient  DVT Prophylaxis  :    heparin  injection 5,000 Units Start: 12/18/23 1400 SCDs Start: 12/18/23 0309    Lab Results  Component Value Date   PLT 179 12/23/2023    Diet :  Diet Order             Diet heart healthy/carb modified Fluid consistency: Thin; Fluid restriction: 1500 mL Fluid  Diet effective now                    Inpatient Medications  Scheduled Meds:  chlordiazePOXIDE   15 mg Oral TID   Chlorhexidine  Gluconate Cloth  6 each Topical Daily   feeding supplement  237 mL Oral BID BM   fenofibrate   160 mg Oral Daily   folic acid   1 mg Oral Daily   heparin   5,000 Units Subcutaneous Q8H   insulin  aspart  0-5 Units Subcutaneous QHS   insulin  aspart  0-9 Units Subcutaneous TID WC   LORazepam   0-4 mg Oral Q8H   multivitamin with minerals  1 tablet Oral Daily   pantoprazole   40 mg Oral QHS   potassium & sodium phosphates   1 packet Oral TID WC & HS   rosuvastatin   20 mg Oral Daily   thiamine   100 mg Oral Daily   Or   thiamine   100 mg Intravenous Daily   Continuous Infusions:   PRN Meds:.acetaminophen , docusate sodium , loperamide , mouth rinse, phenol, polyethylene glycol, simethicone   Antibiotics  :     Anti-infectives (From admission, onward)    None         Objective:   Vitals:   12/22/23 2000 12/23/23 0000 12/23/23 0506 12/23/23 0555  BP: 120/80 124/78    Pulse: 91 88    Resp: 18   18  Temp: 98.2 F (36.8 C)   98 F (36.7 C)  TempSrc: Oral   Oral  SpO2:      Weight:   73.8 kg   Height:        Wt Readings from Last 3 Encounters:  12/23/23 73.8 kg  08/03/23 77 kg  07/29/23 76.2 kg     Intake/Output Summary (Last 24 hours) at 12/23/2023 0803 Last data filed at 12/22/2023 1700 Gross per 24 hour  Intake 1424.4 ml  Output 975 ml  Net 449.4 ml     Physical Exam  Awake Alert, No new F.N deficits, Normal affect [place].AT,PERRAL Supple Neck, No JVD,   Symmetrical Chest wall movement, Good air movement bilaterally, CTAB RRR,No Gallops,Rubs or new Murmurs,  +ve B.Sounds, Abd Soft, No tenderness,   No Cyanosis, Clubbing or edema      Data Review:    Recent Labs  Lab 12/17/23 1308 12/18/23 0102 12/19/23 0415 12/19/23 0425 12/20/23 0420 12/21/23 0801 12/22/23 0616 12/23/23 0626  WBC 3.0*   < > 4.4  --  5.3 6.3 6.5 6.5  HGB 12.1*   < > 13.2 13.6 11.9* 10.8* 10.8* 12.2*  HCT 34.3*   < > 35.7* 40.0 33.7* 30.7* 31.0* 35.2*  PLT 102*   < > 78*  --  82* 114* 136* 179  MCV 86.0   < > 86.2  --  87.8  87.7 88.8 88.9  MCH 30.3   < > 31.9  --  31.0 30.9 30.9 30.8  MCHC 35.3   < > 37.0*  --  35.3 35.2 34.8 34.7  RDW 14.5   < > 13.4  --  14.1 14.8 15.5 15.9*  LYMPHSABS 0.5*  --   --   --   --   --   --  2.0  MONOABS 0.2  --   --   --   --   --   --  1.1*  EOSABS 0.0  --   --   --   --   --   --  0.0  BASOSABS 0.0  --   --   --   --   --   --  0.1   < > = values in this interval not displayed.    Recent Labs  Lab 12/18/23 0457 12/18/23 0458 12/19/23 0415 12/19/23 0425 12/19/23 1526 12/19/23 1840 12/19/23 2345 12/20/23 0420 12/21/23 0801 12/22/23 0616 12/23/23 0626  NA 117*   < > 127*   < > 128*   < > 127* 128* 131* 132* 130*  K RESULTS UNAVAILABLE DUE TO  INTERFERING SUBSTANCE   < > 2.9*   < > 4.0  --   --  3.2* 3.3* 3.7 3.6  CL 83*  --  92*  --  97*  --   --  97* 98 100 96*  CO2 20*  --  23  --  18*  --   --  21* 22 24 23   ANIONGAP 14  --  12  --  13  --   --  10 11 8 11   GLUCOSE 193*  --  279*  --  195*  --   --  230* 134* 170* 114*  BUN 7  --  <5*  --  <5*  --   --  <5* <5* 5* 6  CREATININE 0.37*  --  0.66  --  0.76  --   --  0.71 0.62 0.65 0.78  AST RESULTS UNAVAILABLE DUE TO INTERFERING SUBSTANCE  --   --   --   --   --   --  163* 118* 120* 157*  ALT RESULTS UNAVAILABLE DUE TO INTERFERING SUBSTANCE  --   --   --   --   --   --  105* 80* 71* 77*  ALKPHOS RESULTS UNAVAILABLE DUE TO INTERFERING SUBSTANCE  --   --   --   --   --   --  302* 286* 347* 384*  BILITOT RESULTS UNAVAILABLE DUE TO INTERFERING SUBSTANCE  --   --   --   --   --   --  5.6* 4.6* 4.1* 4.4*  ALBUMIN 2.5*  --   --   --   --   --   --  2.0* 2.1* 2.1* 2.4*  INR  --   --   --   --   --   --   --   --  1.0  --   --   TSH 1.640  --   --   --   --   --   --   --   --   --   --   HGBA1C 9.0*  --   --   --   --   --   --   --   --   --   --   MG  --   --  RESULTS UNAVAILABLE DUE TO INTERFERING SUBSTANCE  --   --   --   --  1.7 1.9 2.0 2.2  PHOS  --   --  1.1*  --  2.1*  --   --  2.1* 2.8 3.1  --   CALCIUM  7.6*  --  8.1*  --  8.4*  --   --  8.1* 7.7* 8.1* 9.1   < > = values in this interval not displayed.      Recent Labs  Lab 12/18/23 0457 12/19/23 0415 12/19/23 1526 12/20/23 0420 12/21/23 0801 12/22/23 0616 12/23/23 0626  INR  --   --   --   --  1.0  --   --   TSH 1.640  --   --   --   --   --   --   HGBA1C 9.0*  --   --   --   --   --   --   MG  --  RESULTS UNAVAILABLE DUE TO INTERFERING SUBSTANCE  --  1.7 1.9 2.0 2.2  CALCIUM  7.6* 8.1* 8.4* 8.1* 7.7* 8.1* 9.1    --------------------------------------------------------------------------------------------------------------- Lab Results  Component Value Date   CHOL 587 (H) 12/19/2023   HDL NOT REPORTED DUE TO HIGH  TRIGLYCERIDES 12/19/2023   LDLCALC UNABLE TO CALCULATE IF TRIGLYCERIDE OVER 400 mg/dL 92/68/7974   LDLDIRECT UNABLE TO CALCULATE IF TRIGLYCERIDE IS >1293 mg/dL 92/68/7974   TRIG 493 (H) 12/23/2023   CHOLHDL NOT REPORTED DUE TO HIGH TRIGLYCERIDES 12/19/2023    Lab Results  Component Value Date   HGBA1C 9.0 (H) 12/18/2023      Micro Results Recent Results (from the past 240 hours)  MRSA Next Gen by PCR, Nasal     Status: None   Collection Time: 12/18/23  3:09 AM   Specimen: Nasal Mucosa; Nasal Swab  Result Value Ref Range Status   MRSA by PCR Next Gen NOT DETECTED NOT DETECTED Final    Comment: (NOTE) The GeneXpert MRSA Assay (FDA approved for NASAL specimens only), is one component of a comprehensive MRSA colonization surveillance program. It is not intended to diagnose MRSA infection nor to guide or monitor treatment for MRSA infections. Test performance is not FDA approved in patients less than 50 years old. Performed at [facility], [address] [place], [place] [address]     Radiology Report No results found.    Signature  -   Lavada Stank M.D on 12/23/2023 at 8:03 AM   -  To page go to [email or web address]

## 2023-12-23 NOTE — Inpatient Diabetes Management (Addendum)
 Inpatient Diabetes Program Recommendations  AACE/ADA: New Consensus Statement on Inpatient Glycemic Control (2015)  Target Ranges:  Prepandial:   less than 140 mg/dL      Peak postprandial:   less than 180 mg/dL (1-2 hours)      Critically ill patients:  140 - 180 mg/dL   Lab Results  Component Value Date   GLUCAP 216 (H) 12/23/2023   HGBA1C 9.3 (H) 12/21/2023    Review of Glycemic Control  Latest Reference Range & Units 12/22/23 08:15 12/22/23 11:21 12/22/23 16:27 12/22/23 21:12 12/23/23 07:57 12/23/23 11:27  Glucose-Capillary 70 - 99 mg/dL 868 (H) 806 (H) 814 (H) 229 (H) 121 (H) 216 (H)   Diabetes history: DM 2 Outpatient Diabetes medications: Metformin  in the past was not recently taking it Current orders for Inpatient glycemic control:  Novolog  0-9 units tid + hs Ensure + high protein (19 grams of carbohydrates) spiking glucose trends postprandially  A1c 9.3% on  8/2  Spoke with pt at bedside regarding A1c and glucose control at home. Pt reports having a glucometer and checks his glucose frequently and sees numbers in the 120-160 range. Discussed current A1c level. Reviewed glucose and A1c goals. We discussed lifestyle and his diet. Spoke about eliminating ETOH and the effects on glucose control. Based on current glucose trends may only need oral agents at time of discharge. MD to assess closer to d/c.  Thanks,  Clotilda Bull RN, MSN, BC-ADM Inpatient Diabetes Coordinator Team Pager [phone] (8a-5p)

## 2023-12-23 NOTE — Progress Notes (Signed)
 Physical Therapy Treatment Patient Details Name: Derek Cruz MRN: [id number] DOB: 07-27-1965 Today's Date: 12/23/2023   History of Present Illness Pt is 58 yo presenting to [facility] on 7/30 due to 3 days of N/V/D associated with intermittent abdominal pain and dyspnea. PMH: ETOH abuse, HTN, fatty liver, CKD.    PT Comments  Pt tolerated treatment well today. Pt received ambulating around room by himself. Pt today was able to progress ambulation in hallway with supervision no AD and navigate a full flight of stairs also with supervision. DC recs updated to HHPT however pt may continue progressing to no follow up PT needs at all. PT will continue to follow.     If plan is discharge home, recommend the following: A little help with walking and/or transfers;A little help with bathing/dressing/bathroom;Help with stairs or ramp for entrance;Assistance with cooking/housework;Direct supervision/assist for medications management   Can travel by private vehicle        Equipment Recommendations  Rolling walker (2 wheels);BSC/3in1    Recommendations for Other Services       Precautions / Restrictions Precautions Precautions: Fall Recall of Precautions/Restrictions: Impaired Restrictions Weight Bearing Restrictions Per Provider Order: No     Mobility  Bed Mobility               General bed mobility comments: Pt up walking around room    Transfers                   General transfer comment: Pt up walking around room upon arrival.    Ambulation/Gait Ambulation/Gait assistance: Supervision Gait Distance (Feet): 600 Feet Assistive device: None Gait Pattern/deviations: Decreased stride length, Step-through pattern Gait velocity: decreased     General Gait Details: Pt able to ambulate much faster compared to previous session. no LOB or ataxia noted.   Stairs Stairs: Yes Stairs assistance: Supervision Stair Management: One rail Right, Alternating pattern,  Forwards Number of Stairs: 10 General stair comments: no LOB noted.   Wheelchair Mobility     Tilt Bed    Modified Rankin (Stroke Patients Only)       Balance Overall balance assessment: Mild deficits observed, not formally tested                                          Communication Communication Communication: No apparent difficulties  Cognition Arousal: Alert Behavior During Therapy: WFL for tasks assessed/performed   PT - Cognitive impairments: No apparent impairments                         Following commands: Intact      Cueing Cueing Techniques: Verbal cues  Exercises      General Comments General comments (skin integrity, edema, etc.): VSS      Pertinent Vitals/Pain Pain Assessment Pain Assessment: No/denies pain    Home Living                          Prior Function            PT Goals (current goals can now be found in the care plan section) Progress towards PT goals: Progressing toward goals    Frequency    Min 2X/week      PT Plan      Co-evaluation  AM-PAC PT 6 Clicks Mobility   Outcome Measure  Help needed turning from your back to your side while in a flat bed without using bedrails?: A Little Help needed moving from lying on your back to sitting on the side of a flat bed without using bedrails?: A Little Help needed moving to and from a bed to a chair (including a wheelchair)?: A Little Help needed standing up from a chair using your arms (e.g., wheelchair or bedside chair)?: A Little Help needed to walk in hospital room?: A Little Help needed climbing 3-5 steps with a railing? : A Little 6 Click Score: 18    End of Session Equipment Utilized During Treatment: Gait belt Activity Tolerance: Patient tolerated treatment well Patient left: in bed;with call bell/phone within reach Nurse Communication: Mobility status PT Visit Diagnosis: Other abnormalities of gait and  mobility (R26.89);Unsteadiness on feet (R26.81);Ataxic gait (R26.0)     Time: 1028-1040 PT Time Calculation (min) (ACUTE ONLY): 12 min  Charges:    $Gait Training: 8-22 mins PT General Charges $$ ACUTE PT VISIT: 1 Visit                     Sueellen NOVAK, PT, DPT Acute Rehab Services [phone]    Derek Cruz 12/23/2023, 12:06 PM

## 2023-12-23 NOTE — TOC CM/SW Note (Signed)
 Transition of Care [facility]) - Inpatient Brief Assessment   Patient Details  Name: Kaleo Condrey MRN: [id number] Date of Birth: 01/04/1966  Transition of Care [facility]) CM/SW Contact:    Inocente GORMAN Kindle, LCSW Phone Number: 12/23/2023, 8:49 AM   Clinical Narrative: ICM continuing to follow for disposition needs.    Transition of Care Asessment: Insurance and Status: Insurance coverage has been reviewed (Patient does not have Aeronautical engineer) Patient has primary care physician: Yes Home environment has been reviewed: Yes Prior level of function:: Independent Prior/Current Home Services: No current home services Social Drivers of Health Review: SDOH reviewed no interventions necessary Readmission risk has been reviewed: Yes Transition of care needs: transition of care needs identified, TOC will continue to follow

## 2023-12-24 ENCOUNTER — Other Ambulatory Visit (HOSPITAL_COMMUNITY): Payer: Self-pay

## 2023-12-24 ENCOUNTER — Ambulatory Visit: Payer: Self-pay | Admitting: Family

## 2023-12-24 LAB — CBC WITH DIFFERENTIAL/PLATELET
Abs Granulocyte: 3.7 K/uL (ref 1.5–6.5)
Abs Immature Granulocytes: 0.38 K/uL — ABNORMAL HIGH (ref 0.00–0.07)
Basophils Absolute: 0.1 K/uL (ref 0.0–0.1)
Basophils Relative: 1 %
Eosinophils Absolute: 0.1 K/uL (ref 0.0–0.5)
Eosinophils Relative: 1 %
HCT: 34.7 % — ABNORMAL LOW (ref 39.0–52.0)
Hemoglobin: 11.8 g/dL — ABNORMAL LOW (ref 13.0–17.0)
Immature Granulocytes: 6 %
Lymphocytes Relative: 25 %
Lymphs Abs: 1.7 K/uL (ref 0.7–4.0)
MCH: 30.2 pg (ref 26.0–34.0)
MCHC: 34 g/dL (ref 30.0–36.0)
MCV: 88.7 fL (ref 80.0–100.0)
Monocytes Absolute: 1 K/uL (ref 0.1–1.0)
Monocytes Relative: 15 %
Neutro Abs: 3.7 K/uL (ref 1.7–7.7)
Neutrophils Relative %: 52 %
Platelets: 226 K/uL (ref 150–400)
RBC: 3.91 MIL/uL — ABNORMAL LOW (ref 4.22–5.81)
RDW: 15.9 % — ABNORMAL HIGH (ref 11.5–15.5)
Smear Review: NORMAL
WBC: 6.9 K/uL (ref 4.0–10.5)
nRBC: 4.3 % — ABNORMAL HIGH (ref 0.0–0.2)

## 2023-12-24 LAB — COMPREHENSIVE METABOLIC PANEL WITH GFR
ALT: 70 U/L — ABNORMAL HIGH (ref 0–44)
AST: 144 U/L — ABNORMAL HIGH (ref 15–41)
Albumin: 2.3 g/dL — ABNORMAL LOW (ref 3.5–5.0)
Alkaline Phosphatase: 427 U/L — ABNORMAL HIGH (ref 38–126)
Anion gap: 8 (ref 5–15)
BUN: 8 mg/dL (ref 6–20)
CO2: 24 mmol/L (ref 22–32)
Calcium: 9 mg/dL (ref 8.9–10.3)
Chloride: 96 mmol/L — ABNORMAL LOW (ref 98–111)
Creatinine, Ser: 0.79 mg/dL (ref 0.61–1.24)
GFR, Estimated: >60 mL/min (ref >60)
Glucose, Bld: 220 mg/dL — ABNORMAL HIGH (ref 70–99)
Potassium: 3.6 mmol/L (ref 3.5–5.1)
Sodium: 128 mmol/L — ABNORMAL LOW (ref 135–145)
Total Bilirubin: 4.8 mg/dL — ABNORMAL HIGH (ref 0.0–1.2)
Total Protein: 6.5 g/dL (ref 6.5–8.1)

## 2023-12-24 LAB — MAGNESIUM: Magnesium: 2.2 mg/dL (ref 1.7–2.4)

## 2023-12-24 LAB — GLUCOSE, CAPILLARY: Glucose-Capillary: 172 mg/dL — ABNORMAL HIGH (ref 70–99)

## 2023-12-24 LAB — TRIGLYCERIDES: Triglycerides: 521 mg/dL — ABNORMAL HIGH (ref <150)

## 2023-12-24 MED ORDER — FENOFIBRATE 160 MG PO TABS
160.0000 mg | ORAL_TABLET | Freq: Every day | ORAL | 1 refills | Status: AC
  Filled 2023-12-24: qty 30, 30d supply, fill #0

## 2023-12-24 MED ORDER — ONDANSETRON 4 MG PO TBDP
4.0000 mg | ORAL_TABLET | Freq: Three times a day (TID) | ORAL | 0 refills | Status: AC | PRN
  Filled 2023-12-24: qty 10, 4d supply, fill #0

## 2023-12-24 MED ORDER — ROSUVASTATIN CALCIUM 20 MG PO TABS
20.0000 mg | ORAL_TABLET | Freq: Every day | ORAL | 1 refills | Status: AC
  Filled 2023-12-24: qty 30, 30d supply, fill #0

## 2023-12-24 MED ORDER — FOLIC ACID 1 MG PO TABS
1.0000 mg | ORAL_TABLET | Freq: Every day | ORAL | 0 refills | Status: AC
  Filled 2023-12-24: qty 30, 30d supply, fill #0

## 2023-12-24 MED ORDER — THIAMINE HCL 100 MG PO TABS
100.0000 mg | ORAL_TABLET | Freq: Every day | ORAL | 0 refills | Status: AC
  Filled 2023-12-24: qty 30, 30d supply, fill #0

## 2023-12-24 NOTE — Progress Notes (Signed)
 Physical Therapy Treatment Patient Details Name: Derek Cruz MRN: [id number] DOB: 1966-03-29 Today's Date: 12/24/2023   History of Present Illness Pt is 58 yo presenting to [facility] on 7/30 due to 3 days of N/V/D associated with intermittent abdominal pain and dyspnea. PMH: ETOH abuse, HTN, fatty liver, CKD.    PT Comments  Pt tolerated treatment well today. Pt today was able to ambulate in hallway independently. DC recs updated to no PT follow up. Pt anticipates DC home today.    If plan is discharge home, recommend the following: A little help with walking and/or transfers;A little help with bathing/dressing/bathroom;Help with stairs or ramp for entrance;Assistance with cooking/housework;Direct supervision/assist for medications management   Can travel by private vehicle        Equipment Recommendations  Rolling walker (2 wheels);BSC/3in1    Recommendations for Other Services       Precautions / Restrictions Precautions Precautions: Fall Recall of Precautions/Restrictions: Impaired Restrictions Weight Bearing Restrictions Per Provider Order: No     Mobility  Bed Mobility               General bed mobility comments: Pt up in recliner    Transfers Overall transfer level: Independent Equipment used: None Transfers: Sit to/from Stand Sit to Stand: Independent                Ambulation/Gait Ambulation/Gait assistance: Independent Gait Distance (Feet): 600 Feet Assistive device: None Gait Pattern/deviations: Decreased stride length, Step-through pattern Gait velocity: decreased     General Gait Details: Pt able to ambulate much faster compared to previous session. no LOB or ataxia noted.   Stairs             Wheelchair Mobility     Tilt Bed    Modified Rankin (Stroke Patients Only)       Balance Overall balance assessment: No apparent balance deficits (not formally assessed)                                           Communication Communication Communication: No apparent difficulties  Cognition Arousal: Alert Behavior During Therapy: WFL for tasks assessed/performed   PT - Cognitive impairments: No apparent impairments                         Following commands: Intact      Cueing Cueing Techniques: Verbal cues  Exercises      General Comments General comments (skin integrity, edema, etc.): VSS      Pertinent Vitals/Pain Pain Assessment Pain Assessment: No/denies pain    Home Living                          Prior Function            PT Goals (current goals can now be found in the care plan section) Progress towards PT goals: Progressing toward goals    Frequency    Min 2X/week      PT Plan      Co-evaluation              AM-PAC PT 6 Clicks Mobility   Outcome Measure  Help needed turning from your back to your side while in a flat bed without using bedrails?: None Help needed moving from lying on your back to sitting on the side of  a flat bed without using bedrails?: None Help needed moving to and from a bed to a chair (including a wheelchair)?: None Help needed standing up from a chair using your arms (e.g., wheelchair or bedside chair)?: None Help needed to walk in hospital room?: None Help needed climbing 3-5 steps with a railing? : A Little 6 Click Score: 23    End of Session Equipment Utilized During Treatment: Gait belt Activity Tolerance: Patient tolerated treatment well Patient left: in bed;with call bell/phone within reach Nurse Communication: Mobility status PT Visit Diagnosis: Other abnormalities of gait and mobility (R26.89);Unsteadiness on feet (R26.81);Ataxic gait (R26.0)     Time: 9072-9062 PT Time Calculation (min) (ACUTE ONLY): 10 min  Charges:    $Gait Training: 8-22 mins PT General Charges $$ ACUTE PT VISIT: 1 Visit                     Sueellen NOVAK, PT, DPT Acute Rehab Services [phone]    Derek Cruz 12/24/2023, 9:49 AM

## 2023-12-24 NOTE — Progress Notes (Signed)
 AVS and plan of care reviewed with patient.  Patient verbally understands discharge plan of care.   Special educational needs teacher at bedside.  No PIV gauze dressing intact.   Patient discharged to lounge with TOC meds needed.

## 2023-12-24 NOTE — Discharge Instructions (Signed)
 Follow with Primary MD Lorren Greig PARAS, NP in 7 days   Get CBC, CMP, Magnesium , lipid panel-  checked next visit with your primary MD    Activity: As tolerated with Full fall precautions use walker/cane & assistance as needed  Disposition Home   Diet: Heart Healthy low carbohydrate diet  Special Instructions: If you have smoked or chewed Tobacco  in the last 2 yrs please stop smoking, stop any regular Alcohol   and or any Recreational drug use.  On your next visit with your primary care physician please Get Medicines reviewed and adjusted.  Please request your Prim.MD to go over all Hospital Tests and Procedure/Radiological results at the follow up, please get all Hospital records sent to your Prim MD by signing hospital release before you go home.  If you experience worsening of your admission symptoms, develop shortness of breath, life threatening emergency, suicidal or homicidal thoughts you must seek medical attention immediately by calling 911 or calling your MD immediately  if symptoms less severe.  You Must read complete instructions/literature along with all the possible adverse reactions/side effects for all the Medicines you take and that have been prescribed to you. Take any new Medicines after you have completely understood and accpet all the possible adverse reactions/side effects.   Do not drive when taking Pain medications.  Do not take more than prescribed Pain, Sleep and Anxiety Medications  Wear Seat belts while driving.

## 2023-12-24 NOTE — Discharge Summary (Signed)
 Derek Cruz FMW:[id number] DOB: 04-18-1966 DOA: 12/17/2023  PCP: Lorren Greig PARAS, NP  Admit date: 12/17/2023  Discharge date: 12/24/2023  Admitted From: Home   Disposition:  Home   Recommendations for Outpatient Follow-up:   Follow up with PCP in 1-2 weeks  PCP Please obtain BMP/CBC, 2 view CXR in 1week,  (see Discharge instructions)   PCP Please follow up on the following pending results:    Home Health: None   Equipment/Devices: None  Consultations: None  Discharge Condition: Stable    CODE STATUS: Full    Diet Recommendation: Heart Healthy Low Carb    Chief Complaint  Patient presents with   Withdrawal     Brief history of present illness from the day of admission and additional interim summary     58 y.o. male who has a PMH including but not limited to alcohol  abuse, hypertension, fatty liver, DM type II, CKD 2. He presented to [facility] ED on 12/17/2023 with N/V/D for 3 days associated with intermittent abdominal pain and dyspnea.  He was diagnosed with acute pancreatitis in the setting of alcohol  abuse and elevated triglycerides and admitted to ICU                                                                  Hospital Course   Ongoing alcohol  abuse, alcohol  intoxication, alcoholic pancreatitis, mild to moderate elevation of triglycerides.   Was in DTs, DTs have improved, was given bowel rest and IV fluids along with IV insulin  drip for moderately elevated triglycerides, right upper quadrant ultrasound is stable, he does appear to have underlying liver injury from ongoing alcohol  abuse, CBD on the ultrasound is stable, LFTs are trending down.  Abdominal pain has improved   Overall clinically better continue IV fluids, discontinue insulin  drip, low carbohydrate diet, placed on Crestor  and Tricor   for triglycerides, monitor abdominal pain and LFTs.  Strictly counseled to quit from alcohol .  DTs have completely resolved, he is back to his baseline and symptom-free eager for home discharge, will be discharged home postdischarge outpatient PCP and GI follow-up.   Dyslipidemia with moderately elevated triglycerides.  Crestor  and Tricor , low carbohydrate heart healthy diet.  Abstain from alcohol .   Hypokalemia, hypomagnesemia, hypophosphatemia.  Replaced and stable.   Dehydration with hyponatremia.  Hydrated with IV fluids.  Stable.   Dysphagia.  Seen by speech, stable now this problem has resolved.   Acute metabolic encephalopathy due to alcohol  intoxication and withdrawal.  Briefly resolved not in any DTs now.    Discharge diagnosis     Principal Problem:   Alcohol  withdrawal [facility]) Active Problems:   Hypophosphatemia   Hypertriglyceridemia    Discharge instructions    Discharge Instructions     Discharge instructions   Complete by: As directed  Follow with Primary MD Lorren Greig PARAS, NP in 7 days   Get CBC, CMP, Magnesium , lipid panel-  checked next visit with your primary MD    Activity: As tolerated with Full fall precautions use walker/cane & assistance as needed  Disposition Home   Diet: Heart Healthy low carbohydrate diet  Special Instructions: If you have smoked or chewed Tobacco  in the last 2 yrs please stop smoking, stop any regular Alcohol   and or any Recreational drug use.  On your next visit with your primary care physician please Get Medicines reviewed and adjusted.  Please request your Prim.MD to go over all Hospital Tests and Procedure/Radiological results at the follow up, please get all Hospital records sent to your Prim MD by signing hospital release before you go home.  If you experience worsening of your admission symptoms, develop shortness of breath, life threatening emergency, suicidal or homicidal thoughts you must seek medical attention  immediately by calling 911 or calling your MD immediately  if symptoms less severe.  You Must read complete instructions/literature along with all the possible adverse reactions/side effects for all the Medicines you take and that have been prescribed to you. Take any new Medicines after you have completely understood and accpet all the possible adverse reactions/side effects.   Do not drive when taking Pain medications.  Do not take more than prescribed Pain, Sleep and Anxiety Medications  Wear Seat belts while driving.   Increase activity slowly   Complete by: As directed        Discharge Medications   Allergies as of 12/24/2023   No Known Allergies      Medication List     STOP taking these medications    chlordiazePOXIDE  25 MG capsule Commonly known as: LIBRIUM        TAKE these medications    CertaVite/Antioxidants Tabs Tome 1 tableta por va oral diariamente. (Take 1 tablet by mouth daily.)   fenofibrate  160 MG tablet Take 1 tablet (160 mg total) by mouth daily. Start taking on: December 25, 2023   folic acid  1 MG tablet Commonly known as: FOLVITE  Tome 1 tableta (1 mg en total) por va oral diariamente. (Take 1 tablet (1 mg total) by mouth daily.)   Melatonin 1 MG Chew Chew 1 tablet by mouth at bedtime as needed.   metFORMIN  750 MG 24 hr tablet Commonly known as: GLUCOPHAGE -XR Take 2 tablets (1,500 mg total) by mouth daily with breakfast.   ondansetron  4 MG disintegrating tablet Commonly known as: ZOFRAN -ODT Take 1 tablet (4 mg total) by mouth every 8 (eight) hours as needed for nausea or vomiting. What changed: reasons to take this   pantoprazole  20 MG tablet Commonly known as: PROTONIX  Take 1 tablet (20 mg total) by mouth daily for 14 days.   potassium chloride  SA 20 MEQ tablet Commonly known as: KLOR-CON  M Take 1 tablet (20 mEq total) by mouth daily for 5 days.   rosuvastatin  20 MG tablet Commonly known as: CRESTOR  Take 1 tablet (20 mg total) by  mouth daily. Start taking on: December 25, 2023   sucralfate  1 g tablet Commonly known as: Carafate  Take 1 tablet (1 g total) by mouth with breakfast, with lunch, and with evening meal for 7 days.   thiamine  100 MG tablet Commonly known as: VITAMIN B1 Tome 1 tableta (100 mg en total) por va oral diariamente. (Take 1 tablet (100 mg total) by mouth daily.)         Follow-up Information  Lorren Greig PARAS, NP. Schedule an appointment as soon as possible for a visit in 1 week(s).   Specialty: Nurse Practitioner Contact information: [address] [place] [address] [phone]         Stacia Glendia BRAVO, MD. Schedule an appointment as soon as possible for a visit in 1 week(s).   Specialty: Gastroenterology Contact information: [address] [place] [address] [phone]                 Major procedures and Radiology Reports - PLEASE review detailed and final reports thoroughly  -      DG Chest Port 1 View Result Date: 12/21/2023 CLINICAL DATA:  58 year old male with shortness of breath. Status post esophagram yesterday. EXAM: PORTABLE CHEST 1 VIEW COMPARISON:  Chest radiograph 12/17/2023 and earlier. FINDINGS: Portable AP semi upright view at 0709 hours. Mildly lower lung volumes. Normal cardiac size and mediastinal contours. Visualized tracheal air column is within normal limits. Allowing for portable technique the lungs are clear. No pneumothorax or pleural effusion. Barium type contrast at the splenic flexure, partially visible and otherwise paucity of bowel gas in the upper abdomen. No acute osseous abnormality identified. IMPRESSION: Negative portable chest. Electronically Signed   By: VEAR Hurst M.D.   On: 12/21/2023 07:57   DG ESOPHAGUS W SINGLE CM (SOL OR THIN BA) Result Date: 12/20/2023 CLINICAL DATA:  History of alcohol  abuse. In the hospital for alcohol  withdrawal, encephalopathy, electrolyte abnormalities. Has been having nausea,  vomiting, diarrhea, dysphagia. IR consulted for esophagram given difficulty swallowing. EXAM: ESOPHAGUS/BARIUM SWALLOW/TABLET STUDY TECHNIQUE: Single contrast examination was performed using thin liquid barium. Exam significantly limited by patient immobility. This exam was performed by Kimble Clas, PA-C, and was supervised and interpreted by Dr. LOISE Molt. FLUOROSCOPY: Radiation Exposure Index (as provided by the fluoroscopic device): 17.00 mGy Kerma COMPARISON:  None Available. FINDINGS: Swallowing: Not directly assessed due to limited study. Pharynx: Not directly assessed due to limited study. Esophagus: Normal appearance. Esophageal motility: Within normal limits. Hiatal Hernia: None. Gastroesophageal reflux: Moderate amount of reflux to the mid esophagus Ingested 13 mm barium tablet: Passed normally Other: None. IMPRESSION: Limited study. Moderate gastroesophageal reflux. Otherwise unremarkable esophagram. Electronically Signed   By: Ree Molt M.D.   On: 12/20/2023 14:35   US  Abdomen Limited RUQ (LIVER/GB) Result Date: 12/17/2023 CLINICAL DATA:  Right upper quadrant pain EXAM: ULTRASOUND ABDOMEN LIMITED RIGHT UPPER QUADRANT COMPARISON:  CT abdomen and pelvis 08/01/2023 FINDINGS: Gallbladder: No gallstones or wall thickening visualized. No sonographic Murphy sign noted by sonographer. Common bile duct: Diameter: 3 mm, normal Liver: Diffusely increased parenchymal echotexture consistent with diffuse fatty infiltration. No focal lesions are identified. Portal vein is patent on color Doppler imaging with normal direction of blood flow towards the liver. Other: None. IMPRESSION: 1. No evidence of cholelithiasis or acute cholecystitis. 2. Diffuse fatty infiltration of the liver. Electronically Signed   By: Elsie Gravely M.D.   On: 12/17/2023 23:41   DG Chest 2 View Result Date: 12/17/2023 CLINICAL DATA:  Chest pain and vomiting. EXAM: CHEST - 2 VIEW COMPARISON:  Chest radiograph dated 08/01/2023.  FINDINGS: The heart size and mediastinal contours are within normal limits. Both lungs are clear. The visualized skeletal structures are unremarkable. IMPRESSION: No active cardiopulmonary disease. Electronically Signed   By: Vanetta Chou M.D.   On: 12/17/2023 14:16    Micro Results     Recent Results (from the past 240 hours)  MRSA Next Gen by PCR, Nasal  Status: None   Collection Time: 12/18/23  3:09 AM   Specimen: Nasal Mucosa; Nasal Swab  Result Value Ref Range Status   MRSA by PCR Next Gen NOT DETECTED NOT DETECTED Final    Comment: (NOTE) The GeneXpert MRSA Assay (FDA approved for NASAL specimens only), is one component of a comprehensive MRSA colonization surveillance program. It is not intended to diagnose MRSA infection nor to guide or monitor treatment for MRSA infections. Test performance is not FDA approved in patients less than 63 years old. Performed at [facility], [address] [place], [place] [address]     Today   Subjective    Joelle Prithvi Kooi today has no headache,no chest abdominal pain,no new weakness tingling or numbness, feels much better wants to go home today.     Objective   Blood pressure 104/76, pulse 94, temperature 98.8 F (37.1 C), temperature source Oral, resp. rate 17, height 5' 5 (1.651 m), weight 73.8 kg, SpO2 92%.   Intake/Output Summary (Last 24 hours) at 12/24/2023 0924 Last data filed at 12/23/2023 1700 Gross per 24 hour  Intake 480 ml  Output 1000 ml  Net -520 ml    Exam  Awake Alert, No new F.N deficits,    [place].AT,PERRAL Supple Neck,   Symmetrical Chest wall movement, Good air movement bilaterally, CTAB RRR,No Gallops,   +ve B.Sounds, Abd Soft, Non tender,  No Cyanosis, Clubbing or edema    Data Review   Recent Labs  Lab 12/17/23 1308 12/18/23 0102 12/19/23 0415 12/19/23 0425 12/20/23 0420 12/21/23 0801 12/22/23 0616 12/23/23 0626  WBC 3.0*   < > 4.4  --  5.3 6.3 6.5 6.5  HGB 12.1*   <  > 13.2 13.6 11.9* 10.8* 10.8* 12.2*  HCT 34.3*   < > 35.7* 40.0 33.7* 30.7* 31.0* 35.2*  PLT 102*   < > 78*  --  82* 114* 136* 179  MCV 86.0   < > 86.2  --  87.8 87.7 88.8 88.9  MCH 30.3   < > 31.9  --  31.0 30.9 30.9 30.8  MCHC 35.3   < > 37.0*  --  35.3 35.2 34.8 34.7  RDW 14.5   < > 13.4  --  14.1 14.8 15.5 15.9*  LYMPHSABS 0.5*  --   --   --   --   --   --  2.0  MONOABS 0.2  --   --   --   --   --   --  1.1*  EOSABS 0.0  --   --   --   --   --   --  0.0  BASOSABS 0.0  --   --   --   --   --   --  0.1   < > = values in this interval not displayed.    Recent Labs  Lab 12/18/23 0457 12/18/23 0458 12/19/23 0415 12/19/23 0425 12/19/23 1526 12/19/23 1840 12/19/23 2345 12/20/23 0420 12/21/23 0801 12/21/23 1030 12/22/23 0616 12/23/23 0626  NA 117*   < > 127*   < > 128*   < > 127* 128* 131*  --  132* 130*  K RESULTS UNAVAILABLE DUE TO INTERFERING SUBSTANCE   < > 2.9*   < > 4.0  --   --  3.2* 3.3*  --  3.7 3.6  CL 83*  --  92*  --  97*  --   --  97* 98  --  100 96*  CO2 20*  --  23  --  18*  --   --  21* 22  --  24 23  ANIONGAP 14  --  12  --  13  --   --  10 11  --  8 11  GLUCOSE 193*  --  279*  --  195*  --   --  230* 134*  --  170* 114*  BUN 7  --  <5*  --  <5*  --   --  <5* <5*  --  5* 6  CREATININE 0.37*  --  0.66  --  0.76  --   --  0.71 0.62  --  0.65 0.78  AST RESULTS UNAVAILABLE DUE TO INTERFERING SUBSTANCE  --   --   --   --   --   --  163* 118*  --  120* 157*  ALT RESULTS UNAVAILABLE DUE TO INTERFERING SUBSTANCE  --   --   --   --   --   --  105* 80*  --  71* 77*  ALKPHOS RESULTS UNAVAILABLE DUE TO INTERFERING SUBSTANCE  --   --   --   --   --   --  302* 286*  --  347* 384*  BILITOT RESULTS UNAVAILABLE DUE TO INTERFERING SUBSTANCE  --   --   --   --   --   --  5.6* 4.6*  --  4.1* 4.4*  ALBUMIN 2.5*  --   --   --   --   --   --  2.0* 2.1*  --  2.1* 2.4*  INR  --   --   --   --   --   --   --   --  1.0  --   --   --   TSH 1.640  --   --   --   --   --   --   --   --   --    --   --   HGBA1C 9.0*  --   --   --   --   --   --   --   --  9.3*  --   --   MG  --   --  RESULTS UNAVAILABLE DUE TO INTERFERING SUBSTANCE  --   --   --   --  1.7 1.9  --  2.0 2.2  PHOS  --   --  1.1*  --  2.1*  --   --  2.1* 2.8  --  3.1  --   CALCIUM  7.6*  --  8.1*  --  8.4*  --   --  8.1* 7.7*  --  8.1* 9.1   < > = values in this interval not displayed.   Lab Results  Component Value Date   CHOL 587 (H) 12/19/2023   HDL NOT REPORTED DUE TO HIGH TRIGLYCERIDES 12/19/2023   LDLCALC UNABLE TO CALCULATE IF TRIGLYCERIDE OVER 400 mg/dL 92/68/7974   LDLDIRECT UNABLE TO CALCULATE IF TRIGLYCERIDE IS >1293 mg/dL 92/68/7974   TRIG 493 (H) 12/23/2023   CHOLHDL NOT REPORTED DUE TO HIGH TRIGLYCERIDES 12/19/2023    Total Time in preparing paper work, data evaluation and todays exam - 35 minutes  Signature  -    Lavada Stank M.D on 12/24/2023 at 9:24 AM   -  To page go to [email or web address]

## 2023-12-24 NOTE — Plan of Care (Signed)
  Problem: Education: Goal: Knowledge of General Education information will improve Description: Including pain rating scale, medication(s)/side effects and non-pharmacologic comfort measures Outcome: Progressing   Problem: Health Behavior/Discharge Planning: Goal: Ability to manage health-related needs will improve Outcome: Progressing   Problem: Clinical Measurements: Goal: Ability to maintain clinical measurements within normal limits will improve Outcome: Progressing Goal: Will remain free from infection Outcome: Progressing Goal: Diagnostic test results will improve Outcome: Progressing Goal: Respiratory complications will improve Outcome: Progressing Goal: Cardiovascular complication will be avoided Outcome: Progressing   Problem: Activity: Goal: Risk for activity intolerance will decrease Outcome: Progressing   Problem: Nutrition: Goal: Adequate nutrition will be maintained Outcome: Progressing   Problem: Coping: Goal: Level of anxiety will decrease Outcome: Progressing   Problem: Elimination: Goal: Will not experience complications related to bowel motility Outcome: Progressing Goal: Will not experience complications related to urinary retention Outcome: Progressing   Problem: Pain Managment: Goal: General experience of comfort will improve and/or be controlled Outcome: Progressing   Problem: Safety: Goal: Ability to remain free from injury will improve Outcome: Progressing   Problem: Skin Integrity: Goal: Risk for impaired skin integrity will decrease Outcome: Progressing   Problem: Education: Goal: Ability to describe self-care measures that may prevent or decrease complications (Diabetes Survival Skills Education) will improve Outcome: Progressing Goal: Individualized Educational Video(s) Outcome: Progressing   Problem: Coping: Goal: Ability to adjust to condition or change in health will improve Outcome: Progressing   Problem: Fluid  Volume: Goal: Ability to maintain a balanced intake and output will improve Outcome: Progressing   Problem: Health Behavior/Discharge Planning: Goal: Ability to identify and utilize available resources and services will improve Outcome: Progressing Goal: Ability to manage health-related needs will improve Outcome: Progressing   Problem: Metabolic: Goal: Ability to maintain appropriate glucose levels will improve Outcome: Progressing   Problem: Nutritional: Goal: Maintenance of adequate nutrition will improve Outcome: Progressing Goal: Progress toward achieving an optimal weight will improve Outcome: Progressing   Problem: Skin Integrity: Goal: Risk for impaired skin integrity will decrease Outcome: Progressing   Problem: Tissue Perfusion: Goal: Adequacy of tissue perfusion will improve Outcome: Progressing   Problem: Education: Goal: Knowledge of disease or condition will improve Outcome: Progressing Goal: Understanding of discharge needs will improve Outcome: Progressing   Problem: Health Behavior/Discharge Planning: Goal: Ability to identify changes in lifestyle to reduce recurrence of condition will improve Outcome: Progressing Goal: Identification of resources available to assist in meeting health care needs will improve Outcome: Progressing   Problem: Physical Regulation: Goal: Complications related to the disease process, condition or treatment will be avoided or minimized Outcome: Progressing   Problem: Safety: Goal: Ability to remain free from injury will improve Outcome: Progressing

## 2023-12-25 ENCOUNTER — Telehealth: Payer: Self-pay

## 2023-12-25 NOTE — Transitions of Care (Post Inpatient/ED Visit) (Unsigned)
   12/25/2023  Name: Derek Cruz MRN: [id number] DOB: March 15, 1966  Today's TOC FU Call Status: Today's TOC FU Call Status:: Unsuccessful Call (1st Attempt) Unsuccessful Call (1st Attempt) Date: 12/25/23  Attempted to reach the patient regarding the most recent Inpatient/ED visit.  Follow Up Plan: Additional outreach attempts will be made to reach the patient to complete the Transitions of Care (Post Inpatient/ED visit) call.   Signature Julian Lemmings, LPN [facility] Nurse Health Advisor Direct Dial [phone]

## 2023-12-26 ENCOUNTER — Other Ambulatory Visit: Payer: Self-pay

## 2023-12-26 ENCOUNTER — Ambulatory Visit
Admission: EM | Admit: 2023-12-26 | Discharge: 2023-12-26 | Disposition: A | Discharge status: Home / Self Care | Payer: Self-pay | Attending: Physician Assistant | Admitting: Physician Assistant

## 2023-12-26 ENCOUNTER — Telehealth: Payer: Self-pay

## 2023-12-26 DIAGNOSIS — K219 Gastro-esophageal reflux disease without esophagitis: Secondary | ICD-10-CM

## 2023-12-26 DIAGNOSIS — F1010 Alcohol abuse, uncomplicated: Secondary | ICD-10-CM

## 2023-12-26 DIAGNOSIS — F10930 Alcohol use, unspecified with withdrawal, uncomplicated: Secondary | ICD-10-CM

## 2023-12-26 DIAGNOSIS — E1165 Type 2 diabetes mellitus with hyperglycemia: Secondary | ICD-10-CM

## 2023-12-26 DIAGNOSIS — I10 Essential (primary) hypertension: Secondary | ICD-10-CM

## 2023-12-26 DIAGNOSIS — F1020 Alcohol dependence, uncomplicated: Secondary | ICD-10-CM

## 2023-12-26 DIAGNOSIS — K7200 Acute and subacute hepatic failure without coma: Secondary | ICD-10-CM

## 2023-12-26 DIAGNOSIS — F1090 Alcohol use, unspecified, uncomplicated: Secondary | ICD-10-CM

## 2023-12-26 DIAGNOSIS — N1831 Chronic kidney disease, stage 3a: Secondary | ICD-10-CM

## 2023-12-26 DIAGNOSIS — F332 Major depressive disorder, recurrent severe without psychotic features: Secondary | ICD-10-CM

## 2023-12-26 DIAGNOSIS — K7010 Alcoholic hepatitis without ascites: Secondary | ICD-10-CM

## 2023-12-26 DIAGNOSIS — R748 Abnormal levels of other serum enzymes: Secondary | ICD-10-CM

## 2023-12-26 LAB — POCT URINE DIPSTICK
Glucose, UA: 100 mg/dL — AB
Ketones, POC UA: NEGATIVE mg/dL
Leukocytes, UA: NEGATIVE
Nitrite, UA: NEGATIVE
Spec Grav, UA: 1.02 (ref 1.010–1.025)
Urobilinogen, UA: 2 U/dL — AB
pH, UA: 6.5 (ref 5.0–8.0)

## 2023-12-26 LAB — GLUCOSE, POCT (MANUAL RESULT ENTRY): POCT Glucose (KUC): 292 mg/dL — AB (ref 70–99)

## 2023-12-26 MED ORDER — METFORMIN HCL ER 750 MG PO TB24
1500.0000 mg | ORAL_TABLET | Freq: Every day | ORAL | 0 refills | Status: AC
  Filled 2023-12-26: qty 60, 30d supply, fill #0

## 2023-12-26 NOTE — Telephone Encounter (Signed)
Keep all scheduled appointments 

## 2023-12-26 NOTE — ED Provider Notes (Signed)
 [facility]    CSN: [id number] Arrival date & time: 12/26/23  1042      History   Chief Complaint Chief Complaint  Patient presents with   Urinary Problems   Medication Questions    HPI Derek Cruz is a 58 y.o. male.   Patient here today for refill of metformin .  He reports that he has had some urination problems and is not sure if this is related.  He was recently discharged from the hospital and he notes that he had informed them he did not have refill and does not currently have a scheduled appointment next-door for hospital follow-up for several weeks.  He does have known liver failure.  He notes he was concerned because his glucose readings were elevated yesterday which prompted him to come in today.  The history is provided by the patient.    Past Medical History:  Diagnosis Date   Alcohol  abuse    Chest pain    Dyslipidemia    Elevated liver enzymes    Gastritis    Hypertension     Patient Active Problem List   Diagnosis Date Noted   Hypophosphatemia 12/19/2023   Hypertriglyceridemia 12/19/2023   MDD (major depressive disorder), recurrent severe, without psychosis (HCC) 02/05/2023   Alcohol  use disorder, severe, dependence (HCC) 12/20/2022   Alcohol  withdrawal (HCC) 12/08/2022   Abnormal TSH 12/08/2022   Hypokalemia 12/08/2022   CKD (chronic kidney disease), stage III (HCC) 12/08/2022   Alcohol  use disorder 12/01/2022   Frequent falls 12/01/2022   Elevated liver enzymes    Alcohol  abuse    Liver failure (HCC) 05/07/2020   Alcoholic hepatitis    Hyponatremia    Polycythemia    Hemoglobin A1c less than 7.0% 07/16/2019   Neck pain 03/31/2019   Muscle strain 03/31/2019   Undifferentiated abdominal pain 03/31/2019   Gastroesophageal reflux disease without esophagitis 11/24/2018   Chest discomfort 11/24/2018   Essential hypertension 11/24/2018   Pre-diabetes 11/24/2018   Hyperlipidemia 10/06/2016   Chest pain 09/26/2016    Uncontrolled type 2 diabetes mellitus with hyperglycemia, without long-term current use of insulin  (HCC) 09/26/2016    History reviewed. No pertinent surgical history.     Home Medications    Prior to Admission medications   Medication Sig Start Date End Date Taking? Authorizing Provider  fenofibrate  160 MG tablet Take 1 tablet (160 mg total) by mouth daily. 12/25/23   Singh, Prashant K, MD  folic acid  (FOLVITE ) 1 MG tablet Take 1 tablet (1 mg total) by mouth daily. 12/24/23   Singh, Prashant K, MD  Melatonin 1 MG CHEW Chew 1 tablet by mouth at bedtime as needed. Patient not taking: Reported on 12/26/2023 08/03/23   Hildegard Loge, PA-C  metFORMIN  (GLUCOPHAGE -XR) 750 MG 24 hr tablet Take 2 tablets (1,500 mg total) by mouth daily with breakfast. 12/26/23 01/25/24  Billy Asberry FALCON, PA-C  Multiple Vitamin (MULTIVITAMIN WITH MINERALS) TABS tablet Take 1 tablet by mouth daily. Patient not taking: Reported on 12/26/2023 12/06/22   Elgergawy, Brayton RAMAN, MD  ondansetron  (ZOFRAN -ODT) 4 MG disintegrating tablet Take 1 tablet (4 mg total) by mouth every 8 (eight) hours as needed for nausea or vomiting. 12/24/23   Dennise Lavada POUR, MD  pantoprazole  (PROTONIX ) 20 MG tablet Take 1 tablet (20 mg total) by mouth daily for 14 days. 08/01/23 12/26/23  Elnor Jayson LABOR, DO  potassium chloride  SA (KLOR-CON  M) 20 MEQ tablet Take 1 tablet (20 mEq total) by mouth daily for 5 days.  08/01/23 12/26/23  Elnor Jayson LABOR, DO  rosuvastatin  (CRESTOR ) 20 MG tablet Take 1 tablet (20 mg total) by mouth daily. 12/25/23   Singh, Prashant K, MD  sucralfate  (CARAFATE ) 1 g tablet Take 1 tablet (1 g total) by mouth with breakfast, with lunch, and with evening meal for 7 days. 08/01/23 12/26/23  Elnor Jayson LABOR, DO  thiamine  (VITAMIN B1) 100 MG tablet Take 1 tablet (100 mg total) by mouth daily. 12/24/23   Dennise Lavada POUR, MD    Family History Family History  Problem Relation Age of Onset   Diabetes Mellitus II Neg Hx    Stomach cancer Neg Hx    Colon cancer  Neg Hx     Social History Social History   Tobacco Use   Smoking status: Former    Types: Cigarettes    Passive exposure: Past   Smokeless tobacco: Never  Vaping Use   Vaping status: Never Used  Substance Use Topics   Alcohol  use: Yes    Alcohol /week: 3.0 standard drinks of alcohol     Types: 3 Cans of beer per week    Comment: 3 beers daily   Drug use: No     Allergies   Patient has no known allergies.   Review of Systems Review of Systems  Constitutional:  Negative for chills and fever.  Eyes:  Negative for discharge and redness.  Respiratory:  Negative for shortness of breath.   Gastrointestinal:  Negative for nausea and vomiting.  Genitourinary:  Positive for hematuria. Negative for dysuria.  Neurological:  Negative for numbness.     Physical Exam Triage Vital Signs ED Triage Vitals  Encounter Vitals Group     BP 12/26/23 1100 107/71     Girls Systolic BP Percentile --      Girls Diastolic BP Percentile --      Boys Systolic BP Percentile --      Boys Diastolic BP Percentile --      Pulse Rate 12/26/23 1100 76     Resp 12/26/23 1100 20     Temp 12/26/23 1100 98.2 F (36.8 C)     Temp Source 12/26/23 1100 Oral     SpO2 12/26/23 1100 97 %     Weight 12/26/23 1058 162 lb 11.2 oz (73.8 kg)     Height 12/26/23 1058 5' 5 (1.651 m)     Head Circumference --      Peak Flow --      Pain Score 12/26/23 1050 0     Pain Loc --      Pain Education --      Exclude from Growth Chart --    No data found.  Updated Vital Signs BP 107/71 (BP Location: Left Arm)   Pulse 76   Temp 98.2 F (36.8 C) (Oral)   Resp 20   Ht 5' 5 (1.651 m)   Wt 162 lb 11.2 oz (73.8 kg)   SpO2 97%   BMI 27.07 kg/m   Visual Acuity Right Eye Distance:   Left Eye Distance:   Bilateral Distance:    Right Eye Near:   Left Eye Near:    Bilateral Near:     Physical Exam Vitals and nursing note reviewed.  Constitutional:      General: He is not in acute distress.     Appearance: Normal appearance. He is not ill-appearing.  HENT:     Head: Normocephalic and atraumatic.  Eyes:     Comments: Scleral icterus noted bilaterally  Cardiovascular:     Rate and Rhythm: Normal rate and regular rhythm.  Pulmonary:     Effort: Pulmonary effort is normal. No respiratory distress.     Breath sounds: No wheezing, rhonchi or rales.  Neurological:     Mental Status: He is alert.  Psychiatric:        Mood and Affect: Mood normal.        Behavior: Behavior normal.        Thought Content: Thought content normal.      UC Treatments / Results  Labs (all labs ordered are listed, but only abnormal results are displayed) Labs Reviewed  GLUCOSE, POCT (MANUAL RESULT ENTRY) - Abnormal; Notable for the following components:      Result Value   POCT Glucose ([facility]) 292 (*)    All other components within normal limits  POCT URINE DIPSTICK - Abnormal; Notable for the following components:   Color, UA orange (*)    Glucose, UA =100 (*)    Bilirubin, UA small (*)    Blood, UA trace-intact (*)    Urobilinogen, UA 2.0 (*)    All other components within normal limits    EKG   Radiology No results found.  Procedures Procedures (including critical care time)  Medications Ordered in UC Medications - No data to display  Initial Impression / Assessment and Plan / UC Course  I have reviewed the triage vital signs and the nursing notes.  Pertinent labs & imaging results that were available during my care of the patient were reviewed by me and considered in my medical decision making (see chart for details).    Leukos elevated in office, which is expected as patient is not currently treated.  Metformin  refilled.  We were able to move up follow-up appointment for August 20.  Advised sooner follow-up with any further concerns.  Reassured that urinalysis today showed only minimal hematuria.  Final Clinical Impressions(s) / UC Diagnoses   Final diagnoses:  Uncontrolled  type 2 diabetes mellitus with hyperglycemia, without long-term current use of insulin  [facility])   Discharge Instructions   None    ED Prescriptions     Medication Sig Dispense Auth. Provider   metFORMIN  (GLUCOPHAGE -XR) 750 MG 24 hr tablet Take 2 tablets (1,500 mg total) by mouth daily with breakfast. 60 tablet Billy Asberry FALCON, PA-C      PDMP not reviewed this encounter.   Billy Asberry FALCON, PA-C 12/26/23 [phone]

## 2023-12-26 NOTE — Transitions of Care (Post Inpatient/ED Visit) (Signed)
   12/26/2023  Name: Derek Cruz MRN: [id number] DOB: Apr 22, 1966  Today's TOC FU Call Status: Today's TOC FU Call Status:: Successful TOC FU Call Completed Unsuccessful Call (1st Attempt) Date: 12/25/23 [facility] FU Call Complete Date: 12/26/23 Patient's Name and Date of Birth confirmed.  Transition Care Management Follow-up Telephone Call Date of Discharge: 12/24/23 Discharge Facility: [facility]) Type of Discharge: Inpatient Admission Primary Inpatient Discharge Diagnosis:: alcohol  use How have you been since you were released from the hospital?: Better Any questions or concerns?: No  Items Reviewed: Did you receive and understand the discharge instructions provided?: Yes Medications obtained,verified, and reconciled?: Yes (Medications Reviewed) Any new allergies since your discharge?: No Do you have support at home?: Yes People in Home [RPT]: spouse  Medications Reviewed Today: Medications Reviewed Today   Medications were not reviewed in this encounter     Home Care and Equipment/Supplies: Were Home Health Services Ordered?: NA Any new equipment or medical supplies ordered?: NA  Functional Questionnaire: Do you need assistance with bathing/showering or dressing?: No Do you need assistance with meal preparation?: No Do you need assistance with eating?: No Do you have difficulty maintaining continence: No Do you need assistance with getting out of bed/getting out of a chair/moving?: No Do you have difficulty managing or taking your medications?: No  Follow up appointments reviewed: PCP Follow-up appointment confirmed?: Yes Date of PCP follow-up appointment?: 01/08/24 Follow-up Provider: [facility] Follow-up appointment confirmed?: NA Do you need transportation to your follow-up appointment?: No Do you understand care options if your condition(s) worsen?: Yes-patient verbalized understanding    SIGNATURE Julian Lemmings, LPN [facility]  Nurse Health Advisor Direct Dial [phone]

## 2023-12-26 NOTE — ED Triage Notes (Addendum)
 Due to language barrier, an interpreter was present during the history-taking and subsequent discussion (and for part of the physical exam) with this patient. Bette. Number: [id number].  Patient declined interpreter above, English is fine. I need some more pills for my Diabetes, my glucose last night was too high (270), this morning (137). I discussed with the doctor before leaving the hospital to take the medicine is normally take until being seen by Dr. Greig, but I am out of the Metformin . No other concerns voiced.  Also, when I went to the bathroom yesterday (voiding), I saw a lot of blood and had to force myself to go. Note: Sclera (during in person review please).

## 2023-12-26 NOTE — Telephone Encounter (Signed)
 VBCI Referral submission.

## 2023-12-27 ENCOUNTER — Telehealth: Payer: Self-pay | Admitting: *Deleted

## 2023-12-27 NOTE — Progress Notes (Signed)
 Complex Care Management Note Care Guide Note  12/27/2023 Name: Derek Cruz MRN: [id number] DOB: 1965-06-25   Complex Care Management Outreach Attempts: An unsuccessful telephone outreach was attempted today to offer the patient information about available complex care management services.  Follow Up Plan:  Additional outreach attempts will be made to offer the patient complex care management information and services.   Encounter Outcome:  No Answer  [name], CMA [place]  [facility], [facility] Guide Direct Dial: [phone]  Fax: [phone] Website: [email or web address]

## 2023-12-29 ENCOUNTER — Encounter (HOSPITAL_COMMUNITY): Payer: Self-pay

## 2023-12-29 ENCOUNTER — Other Ambulatory Visit: Payer: Self-pay

## 2023-12-29 ENCOUNTER — Emergency Department (HOSPITAL_COMMUNITY)
Admission: EM | Admit: 2023-12-29 | Discharge: 2023-12-29 | Disposition: A | Discharge status: Home / Self Care | Payer: Self-pay | Attending: Emergency Medicine | Admitting: Emergency Medicine

## 2023-12-29 DIAGNOSIS — K640 First degree hemorrhoids: Secondary | ICD-10-CM | POA: Insufficient documentation

## 2023-12-29 DIAGNOSIS — E119 Type 2 diabetes mellitus without complications: Secondary | ICD-10-CM | POA: Insufficient documentation

## 2023-12-29 DIAGNOSIS — Z7984 Long term (current) use of oral hypoglycemic drugs: Secondary | ICD-10-CM | POA: Insufficient documentation

## 2023-12-29 LAB — COMPREHENSIVE METABOLIC PANEL WITH GFR
ALT: 69 U/L — ABNORMAL HIGH (ref 0–44)
AST: 105 U/L — ABNORMAL HIGH (ref 15–41)
Albumin: 2.7 g/dL — ABNORMAL LOW (ref 3.5–5.0)
Alkaline Phosphatase: 305 U/L — ABNORMAL HIGH (ref 38–126)
Anion gap: 12 (ref 5–15)
BUN: 12 mg/dL (ref 6–20)
CO2: 23 mmol/L (ref 22–32)
Calcium: 8.6 mg/dL — ABNORMAL LOW (ref 8.9–10.3)
Chloride: 100 mmol/L (ref 98–111)
Creatinine, Ser: 0.6 mg/dL — ABNORMAL LOW (ref 0.61–1.24)
GFR, Estimated: >60 mL/min (ref >60)
Glucose, Bld: 158 mg/dL — ABNORMAL HIGH (ref 70–99)
Potassium: 3.6 mmol/L (ref 3.5–5.1)
Sodium: 135 mmol/L (ref 135–145)
Total Bilirubin: 2.8 mg/dL — ABNORMAL HIGH (ref 0.0–1.2)
Total Protein: 6.6 g/dL (ref 6.5–8.1)

## 2023-12-29 LAB — CBC
HCT: 32 % — ABNORMAL LOW (ref 39.0–52.0)
Hemoglobin: 10.7 g/dL — ABNORMAL LOW (ref 13.0–17.0)
MCH: 31.6 pg (ref 26.0–34.0)
MCHC: 33.4 g/dL (ref 30.0–36.0)
MCV: 94.4 fL (ref 80.0–100.0)
Platelets: 540 K/uL — ABNORMAL HIGH (ref 150–400)
RBC: 3.39 MIL/uL — ABNORMAL LOW (ref 4.22–5.81)
RDW: 18.1 % — ABNORMAL HIGH (ref 11.5–15.5)
WBC: 7.8 K/uL (ref 4.0–10.5)
nRBC: 0.4 % — ABNORMAL HIGH (ref 0.0–0.2)

## 2023-12-29 LAB — TYPE AND SCREEN
ABO/RH(D): O POS
Antibody Screen: NEGATIVE

## 2023-12-29 LAB — LIPASE, BLOOD: Lipase: 73 U/L — ABNORMAL HIGH (ref 11–51)

## 2023-12-29 LAB — ABO/RH: ABO/RH(D): O POS

## 2023-12-29 MED ORDER — HYDROCORTISONE (PERIANAL) 2.5 % EX CREA
1.0000 | TOPICAL_CREAM | Freq: Two times a day (BID) | CUTANEOUS | 0 refills | Status: AC
  Filled 2023-12-29: qty 30, 10d supply, fill #0

## 2023-12-29 NOTE — ED Triage Notes (Signed)
 Spanish interpreter used for triage:  Pt c.o difficulty passing stool on Wednesday and when he strained only blood came out. Pt also c.o rectal irritation and pain. Pt also c.o right lower abd pain.

## 2023-12-29 NOTE — ED Provider Notes (Signed)
 [place] EMERGENCY DEPARTMENT AT [facility] Provider Note   CSN: [id number] Arrival date & time: 12/29/23  1406     Patient presents with: Rectal Bleeding   Derek Cruz is a 58 y.o. male history of alcohol  abuse, diabetes here presenting with blood in his stool.  Patient was recently admitted to the hospital for alcohol  pancreatitis.  Patient also was noted to have new onset diabetes.  Patient was started on metformin .  Patient states that he was discharged last week from the hospital.  He states that he noticed bleeding from his rectum for the last 2 days.  He states that he had some constipation and was straining and noticed some blood.  He states that he had no blood in his stool today.  His last drink was about 15 days ago and he did not drink since discharge from the hospital.  He never had a colonoscopy or endoscopy.  Patient has no known esophageal varices   The history is provided by the patient.       Prior to Admission medications   Medication Sig Start Date End Date Taking? Authorizing Provider  hydrocortisone  (ANUSOL -HC) 2.5 % rectal cream Place 1 Application rectally 2 (two) times daily. 12/29/23  Yes Patt Alm Macho, MD  fenofibrate  160 MG tablet Take 1 tablet (160 mg total) by mouth daily. 12/25/23   Singh, Prashant K, MD  folic acid  (FOLVITE ) 1 MG tablet Take 1 tablet (1 mg total) by mouth daily. 12/24/23   Singh, Prashant K, MD  Melatonin 1 MG CHEW Chew 1 tablet by mouth at bedtime as needed. Patient not taking: Reported on 12/26/2023 08/03/23   Hildegard Loge, PA-C  metFORMIN  (GLUCOPHAGE -XR) 750 MG 24 hr tablet Take 2 tablets (1,500 mg total) by mouth daily with breakfast. 12/26/23 01/25/24  Billy Asberry FALCON, PA-C  Multiple Vitamin (MULTIVITAMIN WITH MINERALS) TABS tablet Take 1 tablet by mouth daily. Patient not taking: Reported on 12/26/2023 12/06/22   Elgergawy, Brayton RAMAN, MD  ondansetron  (ZOFRAN -ODT) 4 MG disintegrating tablet Take 1 tablet (4 mg total)  by mouth every 8 (eight) hours as needed for nausea or vomiting. 12/24/23   Dennise Lavada POUR, MD  pantoprazole  (PROTONIX ) 20 MG tablet Take 1 tablet (20 mg total) by mouth daily for 14 days. 08/01/23 12/26/23  Elnor Jayson LABOR, DO  potassium chloride  SA (KLOR-CON  M) 20 MEQ tablet Take 1 tablet (20 mEq total) by mouth daily for 5 days. 08/01/23 12/26/23  Elnor Jayson A, DO  rosuvastatin  (CRESTOR ) 20 MG tablet Take 1 tablet (20 mg total) by mouth daily. 12/25/23   Singh, Prashant K, MD  sucralfate  (CARAFATE ) 1 g tablet Take 1 tablet (1 g total) by mouth with breakfast, with lunch, and with evening meal for 7 days. 08/01/23 12/26/23  Elnor Jayson LABOR, DO  thiamine  (VITAMIN B1) 100 MG tablet Take 1 tablet (100 mg total) by mouth daily. 12/24/23   Singh, Prashant K, MD    Allergies: Patient has no known allergies.    Review of Systems  Gastrointestinal:  Positive for blood in stool.  All other systems reviewed and are negative.   Updated Vital Signs BP 136/85   Pulse 94   Temp 97.9 F (36.6 C)   Resp 14   SpO2 97%   Physical Exam Vitals and nursing note reviewed.  Constitutional:      Appearance: Normal appearance.  HENT:     Head: Normocephalic.     Nose: Nose normal.  Mouth/Throat:     Mouth: Mucous membranes are moist.  Eyes:     Extraocular Movements: Extraocular movements intact.     Pupils: Pupils are equal, round, and reactive to light.  Cardiovascular:     Rate and Rhythm: Normal rate and regular rhythm.     Pulses: Normal pulses.     Heart sounds: Normal heart sounds.  Pulmonary:     Effort: Pulmonary effort is normal.     Breath sounds: Normal breath sounds.  Abdominal:     General: Abdomen is flat. Bowel sounds are normal.     Palpations: Abdomen is soft.  Genitourinary:    Comments: Rectal-small external hemorrhoid with no active bleeding. Musculoskeletal:        General: Normal range of motion.     Cervical back: Normal range of motion and neck supple.  Skin:    General:  Skin is warm.     Capillary Refill: Capillary refill takes less than 2 seconds.  Neurological:     General: No focal deficit present.     Mental Status: He is alert and oriented to person, place, and time.  Psychiatric:        Mood and Affect: Mood normal.        Behavior: Behavior normal.     (all labs ordered are listed, but only abnormal results are displayed) Labs Reviewed  COMPREHENSIVE METABOLIC PANEL WITH GFR - Abnormal; Notable for the following components:      Result Value   Glucose, Bld 158 (*)    Creatinine, Ser 0.60 (*)    Calcium  8.6 (*)    Albumin 2.7 (*)    AST 105 (*)    ALT 69 (*)    Alkaline Phosphatase 305 (*)    Total Bilirubin 2.8 (*)    All other components within normal limits  CBC - Abnormal; Notable for the following components:   RBC 3.39 (*)    Hemoglobin 10.7 (*)    HCT 32.0 (*)    RDW 18.1 (*)    Platelets 540 (*)    nRBC 0.4 (*)    All other components within normal limits  LIPASE, BLOOD - Abnormal; Notable for the following components:   Lipase 73 (*)    All other components within normal limits  POC OCCULT BLOOD, ED  ABO/RH  TYPE AND SCREEN    EKG: None  Radiology: No results found.   Procedures   Medications Ordered in the ED - No data to display                                  Medical Decision Making Derek Cruz is a 58 y.o. male here presenting with rectal bleeding.  Patient had some constipation and then had some bleeding.  Patient has small hemorrhoid on my exam.  Patient's hemoglobin is 10.7 and was around 11 when he was admitted to the hospital.  Lipase obtained in triage and it is 6 which is lower than previous.  LFTs are also stable and actually improved from previous.  I think his bleeding likely is from constipation and hemorrhoids.  Will discharge home with Anusol  cream and refer to GI outpatient    Amount and/or Complexity of Data Reviewed Labs: ordered. Decision-making details documented in ED  Course.  Risk Prescription drug management.     Final diagnoses:  Grade I hemorrhoids    ED Discharge Orders  Ordered    hydrocortisone  (ANUSOL -HC) 2.5 % rectal cream  2 times daily        12/29/23 1558    Ambulatory referral to Gastroenterology        12/29/23 1559               Patt Alm Macho, MD 12/29/23 [phone]

## 2023-12-29 NOTE — Discharge Instructions (Addendum)
 You have hemorrhoids that is likely bleeding.  Please use Anusol  cream twice daily  Please continue to avoid drinking alcohol   You may have some bleeding for several days.  Please also use some stool softeners  If you have persistent bleeding I recommend you follow-up with GI  Return to ER if you have severe abdominal pain or uncontrolled bleeding

## 2023-12-30 ENCOUNTER — Other Ambulatory Visit: Payer: Self-pay

## 2023-12-30 NOTE — Progress Notes (Signed)
 Complex Care Management Note Care Guide Note  12/30/2023 Name: Derek Cruz MRN: [id number] DOB: 1965/09/28   Complex Care Management Outreach Attempts: A second unsuccessful outreach was attempted today to offer the patient with information about available complex care management services. Using [employer] PI#[id number] named Rosaline.   Follow Up Plan:  Additional outreach attempts will be made to offer the patient complex care management information and services.   Encounter Outcome:  No Answer  Harlene Satterfield  [facility]  [facility], [facility] Guide  Direct Dial: [phone]  Fax [phone]

## 2023-12-30 NOTE — Telephone Encounter (Signed)
 noted

## 2023-12-31 NOTE — Progress Notes (Signed)
 Complex Care Management Note  Care Guide Note 12/31/2023 Name: Derek Cruz MRN: [id number] DOB: [birth year]  [name] is a 58 y.o. year old male who sees Lorren Greig PARAS, NP for primary care. I reached out to [employer] by phone today to offer complex care management services.  Mr. [name] was given information about Complex Care Management services today including:   The Complex Care Management services include support from the care team which includes your Nurse Care Manager, Clinical Social Worker, or Pharmacist.  The Complex Care Management team is here to help remove barriers to the health concerns and goals most important to you. Complex Care Management services are voluntary, and the patient may decline or stop services at any time by request to their care team member.   Complex Care Management Consent Status: Patient agreed to services and verbal consent obtained.   Follow up plan:  Telephone appointment with complex care management team member scheduled for:  01/06/2024  Encounter Outcome:  Patient Scheduled  [name], CMA, Care Guide [facility]  [facility], [facility] Guide Direct Dial: [phone]  Fax: [phone] Website: [email or web address]

## 2024-01-03 ENCOUNTER — Other Ambulatory Visit: Payer: Self-pay

## 2024-01-06 ENCOUNTER — Other Ambulatory Visit: Payer: Self-pay | Admitting: Licensed Clinical Social Worker

## 2024-01-06 ENCOUNTER — Other Ambulatory Visit: Payer: Self-pay | Admitting: *Deleted

## 2024-01-06 NOTE — Patient Instructions (Signed)
 Derek Cruz - I am sorry I was unable to reach you today for our scheduled appointment. I work with Lorren Greig PARAS, NP and am calling to support your healthcare needs. Please contact me at [phone] at your earliest convenience. I look forward to speaking with you soon.   Thank you,  Herndon Grill, RN, BSN, ACM RN Care Manager [employer] [phone]

## 2024-01-08 ENCOUNTER — Other Ambulatory Visit: Payer: Self-pay

## 2024-01-08 ENCOUNTER — Other Ambulatory Visit: Payer: Self-pay | Admitting: Licensed Clinical Social Worker

## 2024-01-08 ENCOUNTER — Inpatient Hospital Stay: Payer: Self-pay | Admitting: Family Medicine

## 2024-01-08 DIAGNOSIS — E1110 Type 2 diabetes mellitus with ketoacidosis without coma: Secondary | ICD-10-CM

## 2024-01-08 NOTE — Patient Outreach (Signed)
 Complex Care Management   Visit Note  01/08/2024  Name:  Derek Cruz MRN: [id number] DOB: 1966-03-12  Situation: Referral received for Complex Care Management related to SDOH needs; food scarcity; utility assistance I obtained verbal consent from Patient.  Visit completed with Patient  on the phone  Background:   Past Medical History:  Diagnosis Date   Alcohol  abuse    Chest pain    Dyslipidemia    Elevated liver enzymes    Gastritis    Hypertension     Assessment: Patient Reported Symptoms:  Cognitive Cognitive Status: Alert and oriented to person, place, and time Cognitive/Intellectual Conditions Management [RPT]: None reported or documented in medical history or problem list   Health Maintenance Behaviors: Annual physical exam, Stress management Health Facilitated by: Rest, Stress management  Neurological Neurological Review of Symptoms: Weakness, Headaches Neurological Management Strategies: Activity, Adequate rest  HEENT HEENT Symptoms Reported: Sudden change or loss of vision HEENT Management Strategies: Adequate rest, Coping strategies    Cardiovascular Cardiovascular Symptoms Reported: Dizziness, Fatigue Cardiovascular Management Strategies: Adequate rest, Coping strategies  Respiratory Respiratory Symptoms Reported: No symptoms reported Respiratory Management Strategies: Adequate rest, Coping strategies  Endocrine Endocrine Symptoms Reported: Weakness or fatigue, Blurry vision, Headaches    Gastrointestinal Gastrointestinal Symptoms Reported: Abdominal pain or discomfort Additional Gastrointestinal Details: occasional stomach pain Gastrointestinal Management Strategies: Adequate rest, Coping strategies    Genitourinary Genitourinary Symptoms Reported: Frequency Additional Genitourinary Details: frequency Genitourinary Management Strategies: Adequate rest, Coping strategies  Integumentary Integumentary Symptoms Reported: No symptoms reported Skin  Management Strategies: Adequate rest, Coping strategies  Musculoskeletal Musculoskelatal Symptoms Reviewed: Muscle pain, Back pain, Weakness Musculoskeletal Management Strategies: Adequate rest, Coping strategies      Psychosocial Psychosocial Symptoms Reported: Anxiety - if selected complete GAD, Sadness - if selected complete PHQ 2-9, Depression - if selected complete PHQ 2-9 Additional Psychological Details: lives alone; Behavioral Management Strategies: Activity, Coping strategies Behavioral Health Comment: decreased social support Major Change/Loss/Stressor/Fears (CP): Medical condition, self Techniques to Cope with Loss/Stress/Change: Diversional activities, Support group Quality of Family Relationships: unable to assess Do you feel physically threatened by others?: No    01/08/2024    PHQ2-9 Depression Screening   Little interest or pleasure in doing things Several days  Feeling down, depressed, or hopeless Several days  PHQ-2 - Total Score 2  Trouble falling or staying asleep, or sleeping too much Several days  Feeling tired or having little energy Several days  Poor appetite or overeating  Several days  Feeling bad about yourself - or that you are a failure or have let yourself or your family down Several days  Trouble concentrating on things, such as reading the newspaper or watching television Several days  Moving or speaking so slowly that other people could have noticed.  Or the opposite - being so fidgety or restless that you have been moving around a lot more than usual Several days  Thoughts that you would be better off dead, or hurting yourself in some way Not at all  PHQ2-9 Total Score 8  If you checked off any problems, how difficult have these problems made it for you to do your work, take care of things at home, or get along with other people Somewhat difficult  Depression Interventions/Treatment Medication, Counseling    Vitals:   BP is being monitored for  client through visits at PCP office   Medications Reviewed Today     Reviewed by Frances Ozell GORMAN KEN (Social Worker) on 01/08/24 at [phone]  Med List Status: <None>   Medication Order Taking? Sig Documenting Provider Last Dose Status Informant  fenofibrate  160 MG tablet [id number] Unknown  Take 1 tablet (160 mg total) by mouth daily. Singh, Prashant K, MD  Active   folic acid  (FOLVITE ) 1 MG tablet [id number] unknown Take 1 tablet (1 mg total) by mouth daily. Singh, Prashant K, MD  Active   hydrocortisone  (ANUSOL -HC) 2.5 % rectal cream [id number] Unknown  Place 1 Application rectally 2 (two) times daily. Patt Alm Macho, MD  Active   Melatonin 1 MG CHEW [id number] Not taking  Chew 1 tablet by mouth at bedtime as needed.  Patient not taking: Reported on 01/08/2024   Hildegard Loge, PA-C  Active Self  metFORMIN  (GLUCOPHAGE -XR) 750 MG 24 hr tablet [id number] Yes Take 2 tablets (1,500 mg total) by mouth daily with breakfast. Billy Asberry FALCON, PA-C  Active   Multiple Vitamin (MULTIVITAMIN WITH MINERALS) TABS tablet [id number] Not taking  Take 1 tablet by mouth daily.  Patient not taking: Reported on 01/08/2024   Elgergawy, Brayton RAMAN, MD  Active Self  ondansetron  (ZOFRAN -ODT) 4 MG disintegrating tablet [id number] Yes Take 1 tablet (4 mg total) by mouth every 8 (eight) hours as needed for nausea or vomiting. Singh, Prashant K, MD  Active   pantoprazole  (PROTONIX ) 20 MG tablet [id number] Unknown  Take 1 tablet (20 mg total) by mouth daily for 14 days. Elnor Savant A, DO  Expired 12/26/23 2359   potassium chloride  SA (KLOR-CON  M) 20 MEQ tablet [id number] unknown Take 1 tablet (20 mEq total) by mouth daily for 5 days. Elnor Savant A, DO  Expired 12/26/23 2359   rosuvastatin  (CRESTOR ) 20 MG tablet [id number] Yes Take 1 tablet (20 mg total) by mouth daily. Singh, Prashant K, MD  Active   sucralfate  (CARAFATE ) 1 g tablet [id number] Unknown  Take 1 tablet (1 g total) by mouth with breakfast, with lunch, and with evening  meal for 7 days. Elnor Savant A, DO  Expired 12/26/23 2359   thiamine  (VITAMIN B1) 100 MG tablet [id number] Yes Take 1 tablet (100 mg total) by mouth daily. Singh, Prashant K, MD  Active             Recommendation:   PCP Follow-up Continue Current Plan of Care Call LCSW as needed for SW support at [phone].  Follow Up Plan:   LCSW has provided client with LCSW name and phone number and encouraged client to call LCSW as needed for SW support at [phone]   Glendia Pear  MSW, LCSW [place]/Value Based Care [facility] Licensed Clinical Social Worker Direct Dial:  [phone] Fax:  [phone] Website:  [email or web address]

## 2024-01-08 NOTE — Patient Instructions (Signed)
 Visit Information  Thank you for taking time to visit with me today. Please don't hesitate to contact me if I can be of assistance to you before our next scheduled appointment.  Client has LCSW name and phone number and was encouraged to call LCSW as needed for SW support  Please call the care guide team at [phone] if you need to cancel or reschedule your appointment.   Following is a copy of your care plan:   Goals Addressed             This Visit's Progress    VBCI Social Work Care Plan       Problems:  Transport needs Financial needs Food scarcity Job stress Depression  Decreased family support  CSW Clinical Goal(s):   Over the next 30 days the Patient will attend all scheduled medical appointments as evidenced by patient report and care team review of appointment completion in electronic medical record .             Over next 30 days client will contact food resources of help and utility assistance resources available to help manage financial needs AEB patient report of contacting agencies of help in community   Interventions:  Spoke with client via phone today about his current needs and status          Discussed medication procurement. Discussed sleeping challenges. Discussed appetite of client.          Discussed client employment. He works as Designer, fashion/clothing. He works different days and times depending on when he is called to complete a job        Discussed family support. He said he has reduced family support        Discussed financial needs. He has financial stressors. Difficult to pay utility bill. Difficult to get needed food items          Discussed transport needs. Discussed support with NP Amy DOROTHA Drones          Discussed that LCSW would send order for care guides to mail client list of local food resources and list of agencies to possibly help with utility bill. Client agreed to plan           Discussed program support with RN, LCSW, pharmacist         Provided  counseling support         Client said he has shaking in left hand occasionally. LCSW encouraged client to call office of NP and discuss his symptoms with nurse or NP at NP office             Discussed pain issues of client. Completed assessments as needed          Encouraged client to call LCSW as needed for SW support at [phone]  Patient Goals/Self-Care Activities:  Take medications as prescribed            Attend appointments as scheduled with NP             Contact food support agencies when list of food resources is received in the mail             Contact agencies that may help with utility assistance when list of agencies is received in the mail             Call LCSW as needed for SW support  Plan:   LCSW has provided client with LCSW name and phone number and encouraged client to call  LCSW as needed for SW support at [phone]        Please go to [facility] [address] [place] [phone]) if you are experiencing a Mental Health or Behavioral Health Crisis or need someone to talk to.  The patient verbalized understanding of instructions, educational materials, and care plan provided today and DECLINED offer to receive copy of patient instructions, educational materials, and care plan.    Glendia Pear  MSW, LCSW [place] Licensed Clinical Social Worker Direct Dial:  [phone] Fax:  [phone] Website:  [email or web address]

## 2024-01-13 ENCOUNTER — Telehealth: Payer: Self-pay | Admitting: *Deleted

## 2024-01-13 NOTE — Progress Notes (Unsigned)
 Complex Care Management Care Guide Note  01/13/2024 Name: [name] MRN: [id number] DOB: [birth year]  Derek Cruz is a 59 y.o. year old male who is a primary care patient of Lorren Greig PARAS, NP and is actively engaged with the care management team. I reached out to Rosabel Francella Tolentino by phone today to assist with re-scheduling  with the RN Case Manager Licensed Clinical Social Worker.  [employer] used during call PI#[id number] named Tobias  Follow up plan: Unsuccessful telephone outreach attempt made.   Harlene Satterfield  [facility]  Value-Based Care Institute, [facility] Guide  Direct Dial: [phone]  Fax [phone]

## 2024-01-14 NOTE — Progress Notes (Signed)
 Complex Care Management Care Guide Note  01/14/2024 Name: Derek Cruz MRN: [id number] DOB: 10-09-1965  [name] is a 58 y.o. year old male who is a primary care patient of Lorren Greig PARAS, NP and is actively engaged with the care management team. I reached out to [employer] by phone today to assist with scheduling  with the BSW.  Follow up plan: Unsuccessful telephone outreach attempt made. A HIPAA compliant phone message was left for the patient providing contact information and requesting a return call.No further outreach attempts will be made at this time. We have been unable to contact the patient to reschedule for complex care management services.   Harlene Satterfield  [facility]  [facility], [facility] Guide  Direct Dial: [phone]  Fax [phone]

## 2024-01-31 ENCOUNTER — Ambulatory Visit: Payer: Self-pay | Admitting: Family

## 2024-03-30 ENCOUNTER — Encounter: Payer: Self-pay | Admitting: Internal Medicine
